# Patient Record
Sex: Female | Born: 1955 | Race: White | Hispanic: No | Marital: Married | Smoking: Never smoker
Health system: Southern US, Community
[De-identification: ages and names within clinical notes are randomized; demographics above are authoritative.]

## PROBLEM LIST (undated history)

## (undated) DIAGNOSIS — M1990 Unspecified osteoarthritis, unspecified site: Secondary | ICD-10-CM

## (undated) DIAGNOSIS — N179 Acute kidney failure, unspecified: Secondary | ICD-10-CM

## (undated) DIAGNOSIS — R7303 Prediabetes: Secondary | ICD-10-CM

## (undated) DIAGNOSIS — N393 Stress incontinence (female) (male): Secondary | ICD-10-CM

## (undated) DIAGNOSIS — E663 Overweight: Secondary | ICD-10-CM

## (undated) DIAGNOSIS — C9000 Multiple myeloma not having achieved remission: Secondary | ICD-10-CM

## (undated) DIAGNOSIS — I10 Essential (primary) hypertension: Secondary | ICD-10-CM

## (undated) DIAGNOSIS — D649 Anemia, unspecified: Secondary | ICD-10-CM

## (undated) DIAGNOSIS — N6019 Diffuse cystic mastopathy of unspecified breast: Secondary | ICD-10-CM

## (undated) DIAGNOSIS — C801 Malignant (primary) neoplasm, unspecified: Secondary | ICD-10-CM

## (undated) HISTORY — DX: Anemia, unspecified: D64.9

## (undated) HISTORY — PX: CARPAL TUNNEL RELEASE: SHX101

## (undated) HISTORY — DX: Stress incontinence (female) (male): N39.3

## (undated) HISTORY — DX: Multiple myeloma not having achieved remission: C90.00

## (undated) HISTORY — DX: Essential (primary) hypertension: I10

## (undated) HISTORY — PX: TONSILLECTOMY AND ADENOIDECTOMY: SHX28

## (undated) HISTORY — DX: Unspecified osteoarthritis, unspecified site: M19.90

## (undated) HISTORY — DX: Malignant (primary) neoplasm, unspecified: C80.1

## (undated) HISTORY — DX: Overweight: E66.3

## (undated) HISTORY — DX: Diffuse cystic mastopathy of unspecified breast: N60.19

## (undated) HISTORY — DX: Prediabetes: R73.03

---

## 1993-04-14 HISTORY — PX: CHOLECYSTECTOMY: SHX55

## 2003-08-31 ENCOUNTER — Ambulatory Visit: Admission: RE | Admit: 2003-08-31 | Discharge: 2003-08-31 | Payer: Self-pay | Admitting: Emergency Medicine

## 2003-12-13 ENCOUNTER — Ambulatory Visit (HOSPITAL_COMMUNITY): Admission: RE | Admit: 2003-12-13 | Discharge: 2003-12-13 | Payer: Self-pay | Admitting: Family Medicine

## 2006-07-07 ENCOUNTER — Other Ambulatory Visit: Admission: RE | Admit: 2006-07-07 | Discharge: 2006-07-07 | Payer: Self-pay | Admitting: Family Medicine

## 2007-09-03 ENCOUNTER — Other Ambulatory Visit: Admission: RE | Admit: 2007-09-03 | Discharge: 2007-09-03 | Payer: Self-pay | Admitting: Family Medicine

## 2007-09-03 ENCOUNTER — Encounter: Admission: RE | Admit: 2007-09-03 | Discharge: 2007-09-03 | Payer: Self-pay | Admitting: Family Medicine

## 2008-07-13 ENCOUNTER — Other Ambulatory Visit: Admission: RE | Admit: 2008-07-13 | Discharge: 2008-07-13 | Payer: Self-pay | Admitting: Family Medicine

## 2008-09-29 ENCOUNTER — Encounter: Admission: RE | Admit: 2008-09-29 | Discharge: 2008-09-29 | Payer: Self-pay | Admitting: Family Medicine

## 2009-07-10 ENCOUNTER — Other Ambulatory Visit: Admission: RE | Admit: 2009-07-10 | Discharge: 2009-07-10 | Payer: Self-pay | Admitting: Family Medicine

## 2009-10-05 ENCOUNTER — Encounter: Admission: RE | Admit: 2009-10-05 | Discharge: 2009-10-05 | Payer: Self-pay | Admitting: Family Medicine

## 2010-09-27 ENCOUNTER — Other Ambulatory Visit: Payer: Self-pay | Admitting: Obstetrics & Gynecology

## 2010-09-27 DIAGNOSIS — Z1231 Encounter for screening mammogram for malignant neoplasm of breast: Secondary | ICD-10-CM

## 2010-10-08 ENCOUNTER — Ambulatory Visit
Admission: RE | Admit: 2010-10-08 | Discharge: 2010-10-08 | Disposition: A | Discharge status: Home / Self Care | Payer: BC Managed Care – PPO | Source: Ambulatory Visit | Attending: Obstetrics & Gynecology | Admitting: Obstetrics & Gynecology

## 2010-10-08 DIAGNOSIS — Z1231 Encounter for screening mammogram for malignant neoplasm of breast: Secondary | ICD-10-CM

## 2011-07-30 ENCOUNTER — Ambulatory Visit
Admission: RE | Admit: 2011-07-30 | Discharge: 2011-07-30 | Disposition: A | Discharge status: Home / Self Care | Payer: BC Managed Care – PPO | Source: Ambulatory Visit | Attending: Gastroenterology | Admitting: Gastroenterology

## 2011-07-30 ENCOUNTER — Other Ambulatory Visit: Payer: Self-pay | Admitting: Gastroenterology

## 2011-07-30 DIAGNOSIS — R05 Cough: Secondary | ICD-10-CM

## 2011-08-01 ENCOUNTER — Encounter: Payer: Self-pay | Admitting: Internal Medicine

## 2011-08-04 ENCOUNTER — Ambulatory Visit (INDEPENDENT_AMBULATORY_CARE_PROVIDER_SITE_OTHER): Payer: BC Managed Care – PPO | Admitting: Internal Medicine

## 2011-08-04 ENCOUNTER — Encounter: Payer: Self-pay | Admitting: Internal Medicine

## 2011-08-04 DIAGNOSIS — J45909 Unspecified asthma, uncomplicated: Secondary | ICD-10-CM

## 2011-08-04 NOTE — Progress Notes (Signed)
  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  08/04/2011 1st pulmonary ov  Persistent  Mostly dry Cough since 09/2010   and sob wheezing occurs consistently  abuptly  Since onset of coughwhen climbing up steps more so than usual and since daily and nightly symptoms unless props up on two pillows eval by Drs Dominica Severin dx was bronchitis/wheezing rx zpak symbicort.(better when took it then worse when stopped then restarted around April 1 when  worse again,   rx zithmax 3/5 for yellow mucus s typical flare yet in typical springtime sinus complaints.  Sleeping ok without nocturnal  or early am exacerbation  of respiratory  c/o's or need for noct saba. Also denies any obvious fluctuation of symptoms with weather or environmental changes or other aggravating or alleviating factors except as outlined above     Review of Systems  Constitutional: Positive for unexpected weight change. Negative for fever and chills.  HENT: Negative for ear pain, nosebleeds, congestion, sore throat, rhinorrhea, sneezing, trouble swallowing, dental problem, voice change, postnasal drip and sinus pressure.   Eyes: Negative for visual disturbance.  Respiratory: Positive for cough and shortness of breath. Negative for choking.   Cardiovascular: Negative for chest pain and leg swelling.  Gastrointestinal: Positive for abdominal pain. Negative for vomiting and diarrhea.  Genitourinary: Negative for difficulty urinating.  Musculoskeletal: Negative for arthralgias.  Skin: Negative for rash.  Neurological: Negative for tremors, syncope and headaches.  Hematological: Does not bruise/bleed easily.       Objective:   Physical Exam  Wt 170 08/04/2011  HEENT mild turbinate edema.  Oropharynx no  thrush or excess pnd or cobblestoning.  No JVD or cervical adenopathy. Mild accessory muscle hypertrophy. Trachea midline, nl thryroid. Chest was hyperinflated by percussion with diminished breath sounds and mild increased exp time with mid exp wheeze. Hoover sign positive at mid inspiration. Regular rate and rhythm without murmur gallop or rub or increase P2 or edema.  Abd: no hsm, nl excursion. Ext warm without cyanosis or clubbing.    07/30/11 cxr Enlargement of cardiac silhouette with pulmonary vascular  congestion.  Minimal bronchitic changes.     Assessment & Plan:

## 2011-08-04 NOTE — Patient Instructions (Signed)
Symbicort 160 Take 2 puffs first thing in am and then another 2 puffs about 12 hours later.    Work on inhaler technique:  relax and gently blow all the way out then take a nice smooth deep breath back in, triggering the inhaler at same time you start breathing in.  Hold for up to 5 seconds if you can.  Rinse and gargle with water when done   If your mouth or throat starts to bother you,   I suggest you time the inhaler to your dental care and after using the inhaler(s) brush teeth and tongue with a baking soda containing toothpaste and when you rinse this out, gargle with it first to see if this helps your mouth and throat.     GERD (REFLUX)  is an extremely common cause of respiratory symptoms, many times with no significant heartburn at all.    It can be treated with medication, but also with lifestyle changes including avoidance of late meals, excessive alcohol, smoking cessation, and avoid fatty foods, chocolate, peppermint, colas, red wine, and acidic juices such as orange juice.  NO MINT OR MENTHOL PRODUCTS SO NO COUGH DROPS  USE SUGARLESS CANDY INSTEAD (jolley ranchers or Stover's)  NO OIL BASED VITAMINS - use powdered substitutes.   Please schedule a follow up office visit in 2  weeks, sooner if needed.

## 2011-08-07 DIAGNOSIS — J45909 Unspecified asthma, uncomplicated: Secondary | ICD-10-CM | POA: Insufficient documentation

## 2011-08-07 NOTE — Assessment & Plan Note (Signed)
Symptoms are markedly disproportionate to objective findings and not clear this is all a lung problem but pt does appear to have difficult airway management issues.  DDX of  difficult airways managment all start with A and  include Adherence, Ace Inhibitors, Acid Reflux, Active Sinus Disease, Alpha 1 Antitripsin deficiency, Anxiety masquerading as Airways dz,  ABPA,  allergy(esp in young), Aspiration (esp in elderly), Adverse effects of DPI,  Active smokers, plus two Bs  = Bronchiectasis and Beta blocker use..and one C= CHF  Adherence is always the initial "prime suspect" and is a multilayered concern that requires a "trust but verify" approach in every patient - starting with knowing how to use medications, especially inhalers, correctly, keeping up with refills and understanding the fundamental difference between maintenance and prns vs those medications only taken for a very short course and then stopped and not refilled. The proper method of use, as well as anticipated side effects, of a metered-dose inhaler are discussed and demonstrated to the patient. Improved effectiveness after extensive coaching during this visit to a level of approximately  50% but no better. Will try to master this on symbicort then bring back in 2 weeks  ? Acid reflux > reviwewed max gerd rx/ diet  ? Anxiety > dx of exclusion always but note absence of noct and early am exac of symptoms not typical of asthma.

## 2011-08-12 ENCOUNTER — Other Ambulatory Visit: Payer: Self-pay | Admitting: Oncology

## 2011-08-12 ENCOUNTER — Telehealth: Payer: Self-pay | Admitting: Oncology

## 2011-08-12 NOTE — Telephone Encounter (Signed)
Called pt re appt w/FS. Per pt she would like to see GM instead as Dr. Madilyn Fireman told her she was being referred to GM. No specific physician requested on referral. Message sent to GM and copied to Amy/Tiffany asking if GM can see pt. Pt aware to disregard 5/3 appt w/FS and I will call her back w/appt to see GM.

## 2011-08-12 NOTE — Telephone Encounter (Signed)
lmonvm for pt re calling me for appt w/FS. 1st attempt. °

## 2011-08-13 ENCOUNTER — Telehealth: Payer: Self-pay | Admitting: Oncology

## 2011-08-13 ENCOUNTER — Other Ambulatory Visit: Payer: Self-pay | Admitting: Oncology

## 2011-08-13 NOTE — Telephone Encounter (Signed)
Per GM he will see pt 5/8 @ 4:30 pm and have pt come for lb at least 3 days prior. S/w pt today re appt for lb 5/2 @ 9 am and GM 5/8 @ 4pm. Pt requested to be scheduled w/GM.

## 2011-08-14 ENCOUNTER — Other Ambulatory Visit (HOSPITAL_BASED_OUTPATIENT_CLINIC_OR_DEPARTMENT_OTHER): Payer: BC Managed Care – PPO | Admitting: Lab

## 2011-08-14 ENCOUNTER — Telehealth: Payer: Self-pay | Admitting: Oncology

## 2011-08-14 ENCOUNTER — Other Ambulatory Visit: Payer: Self-pay | Admitting: *Deleted

## 2011-08-14 ENCOUNTER — Other Ambulatory Visit: Payer: Self-pay | Admitting: Oncology

## 2011-08-14 DIAGNOSIS — D472 Monoclonal gammopathy: Secondary | ICD-10-CM

## 2011-08-14 DIAGNOSIS — E8809 Other disorders of plasma-protein metabolism, not elsewhere classified: Secondary | ICD-10-CM

## 2011-08-14 LAB — CBC WITH DIFFERENTIAL/PLATELET
BASO%: 0.8 % (ref 0.0–2.0)
EOS%: 1.1 % (ref 0.0–7.0)
HCT: 27.7 % — ABNORMAL LOW (ref 34.8–46.6)
LYMPH%: 26.2 % (ref 14.0–49.7)
MCH: 30.7 pg (ref 25.1–34.0)
MCHC: 32.3 g/dL (ref 31.5–36.0)
MONO%: 6.6 % (ref 0.0–14.0)
Platelets: 310 10*3/uL (ref 145–400)
RBC: 2.91 10*6/uL — ABNORMAL LOW (ref 3.70–5.45)

## 2011-08-14 LAB — PROTEIN / CREATININE RATIO, URINE
Protein Creatinine Ratio: 0.17 — ABNORMAL HIGH (ref <0.15)
Total Protein, Urine: 12 mg/dL

## 2011-08-14 NOTE — Telephone Encounter (Signed)
Referred by Dr. Madilyn Fireman Dx- Anemia

## 2011-08-18 ENCOUNTER — Encounter: Payer: Self-pay | Admitting: Internal Medicine

## 2011-08-18 ENCOUNTER — Ambulatory Visit (INDEPENDENT_AMBULATORY_CARE_PROVIDER_SITE_OTHER): Payer: BC Managed Care – PPO | Admitting: Internal Medicine

## 2011-08-18 DIAGNOSIS — J45909 Unspecified asthma, uncomplicated: Secondary | ICD-10-CM

## 2011-08-18 MED ORDER — BUDESONIDE-FORMOTEROL FUMARATE 80-4.5 MCG/ACT IN AERO: 2.0000 | INHALATION_SPRAY | Freq: Two times a day (BID) | RESPIRATORY_TRACT | Status: DC

## 2011-08-18 NOTE — Patient Instructions (Signed)
Remember to exhale the symbicort thru nose.  Try symbicort 80 Take 2 puffs first thing in am and then another 2 puffs about 12 hours and fill the prescription - ok the night time dose if doing great.   If you are satisfied with your treatment plan let your doctor know and he/she can either refill your medications or you can return here when your prescription runs out.     If in any way you are not 100% satisfied,  please tell us.  If 100% better, tell your friends!

## 2011-08-18 NOTE — Progress Notes (Signed)
  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  08/04/2011 1st pulmonary ov  Persistent  Mostly dry Cough since 09/2010   and sob wheezing occurs consistently  abuptly  Since onset of cough when climbing up steps more so than usual and since daily and nightly symptoms unless props up on two pillows eval by Drs Laura Hester dx was bronchitis/wheezing rx zpak symbicort.(better when took it then worse when stopped then restarted around April 1 when  worse again,   rx zithmax 3/5 for yellow mucus s typical flare yet in typical springtime sinus complaints. rec Symbicort 160 Take 2 puffs first thing in am and then another 2 puffs about 12 hours later.  Work on inhaler technique:    GERD diet    08/18/2011 f/u ov/Laura Hester cc better to her satisfaction no cough/ sob but hasn't tried athletics yet. Walking outdoors ok. No need for saba at all with very good hfa technique.  Sleeping ok without nocturnal  or early am exacerbation  of respiratory  c/o's or need for noct saba. Also denies any obvious fluctuation of symptoms with weather or environmental changes or other aggravating or alleviating factors except as outlined above.  ROS  At present neg for  any significant sore throat, dysphagia, dental problems, itching, sneezing,  nasal congestion or excess/ purulent secretions, ear ache,   fever, chills, sweats, unintended wt loss, pleuritic or exertional cp, hemoptysis, palpitations, orthopnea pnd or leg swelling.  Also denies presyncope, palpitations, heartburn, abdominal pain, anorexia, nausea, vomiting, diarrhea  or change in bowel or urinary habits, change in stools or urine, dysuria,hematuria,  rash,  arthralgias, visual complaints, headache, numbness weakness or ataxia or problems with walking or coordination. No noted change in mood/affect or memory.                          Objective:   Physical Exam  Wt 170 08/04/2011 > 08/18/2011  166 HEENT mild turbinate edema.  Oropharynx no thrush or excess pnd or cobblestoning.  No JVD or cervical adenopathy. Min accessory muscle hypertrophy. Trachea midline, nl thryroid. Chest was not hyperinflated by percussion and there was no increased exp time  Or exp wheeze. Hoover sign neg inspiration. Regular rate and rhythm without murmur gallop or rub or increase P2 or edema.  Abd: no hsm, nl excursion. Ext warm without cyanosis or clubbing.    07/30/11 cxr Enlargement of cardiac silhouette with pulmonary vascular  congestion.  Minimal bronchitic changes.     Assessment & Plan:

## 2011-08-19 LAB — COMPREHENSIVE METABOLIC PANEL
CO2: 24 mEq/L (ref 19–32)
Calcium: 10.1 mg/dL (ref 8.4–10.5)
Chloride: 103 mEq/L (ref 96–112)
Potassium: 4.2 mEq/L (ref 3.5–5.3)
Total Bilirubin: 0.4 mg/dL (ref 0.3–1.2)
Total Protein: 9.4 g/dL — ABNORMAL HIGH (ref 6.0–8.3)

## 2011-08-19 LAB — PROTEIN ELECTROPHORESIS, SERUM
Albumin ELP: 30 % — ABNORMAL LOW (ref 55.8–66.1)
Alpha-1-Globulin: 2.6 % — ABNORMAL LOW (ref 2.9–4.9)

## 2011-08-19 LAB — BETA 2 MICROGLOBULIN, SERUM: Beta-2 Microglobulin: 5.51 mg/L — ABNORMAL HIGH (ref 1.01–1.73)

## 2011-08-19 NOTE — Assessment & Plan Note (Addendum)
All goals of chronic asthma control met including optimal function and elimination of symptoms with minimal need for rescue therapy.  Contingencies discussed in full including contacting this office immediately if not controlling the symptoms using the rule of two's.     Try symbicort 80/4.5 2bid with low threshold to increase back to 160 and add gerd rx if flaring.  The proper method of use, as well as anticipated side effects, of a metered-dose inhaler are discussed and demonstrated to the patient. Improved effectiveness after extensive coaching during this visit to a level of approximately  90%

## 2011-08-20 ENCOUNTER — Ambulatory Visit (HOSPITAL_BASED_OUTPATIENT_CLINIC_OR_DEPARTMENT_OTHER): Payer: BC Managed Care – PPO | Admitting: Oncology

## 2011-08-20 ENCOUNTER — Ambulatory Visit: Payer: BC Managed Care – PPO

## 2011-08-20 ENCOUNTER — Encounter: Payer: Self-pay | Admitting: Oncology

## 2011-08-20 ENCOUNTER — Telehealth: Payer: Self-pay | Admitting: Oncology

## 2011-08-20 DIAGNOSIS — C9000 Multiple myeloma not having achieved remission: Secondary | ICD-10-CM | POA: Insufficient documentation

## 2011-08-20 LAB — UIFE/LIGHT CHAINS/TP QN, 24-HR UR
Albumin, U: DETECTED
Alpha 1, Urine: DETECTED — AB

## 2011-08-20 NOTE — Telephone Encounter (Signed)
Referral in workque for bmbx via IR but no order. Also no pof sent. Message to [facility] via email to add order for bmbx.

## 2011-08-20 NOTE — Progress Notes (Signed)
ID: VELVET MOOMAW   DOB: 07-19-1955  MR#: [id number]  MVH#:[id number]  HISTORY OF PRESENT ILLNESS: Laura Hester has a history of iron deficiency anemia secondary to menorrhagia. However, as this persisted despite near-total cessation of her menses and despite iron supplementation, she was referred to Dr. Dorena Cookey for GI evaluation. He found the patient's Hb to have decreased from 9.17 June 2011 to 8.3 07/29/2011. MCV was 95.9, ferritin 93.3, with a normal folate and negative ANA. Guaiacs were negative. Creatinine was 0.77.  t-transglutaminase IgA was normal at 2, but the total IgA was 6.9 g. Accordingly on 08/04/2011 an SPEP was obtained, showing an M-spike of 4.5 g, with a secondary M-spike of 0.4 g. Total protein was 10.0 with albumin 3.2, calcium 10.2. The patient was referred for further evaluation, with subsequent history as detailed below.  INTERVAL HISTORY: Laura Hester comes today with her husband [name] for a full discussion of her situation. Additional labwork was obtained before the visit further supporting a diagnosis of myeloma.Laura Hester is very concerned that her older daughter's upcoming marriage June 1st not be disrupted but she is willing to have further tests if needed before start of treatment  REVIEW OF SYSTEMS: She had a recent episode of bronchitis treated with zithromax. She saw Francella Solian for persistent cough and he diagnosed asthma. This is much better on her inhaler. She has lost 25 pounds over the last several months--she attributed this to an improved diet. She brought me a list of blood pressures taken off medication and they are convincingly normal.She feels the diet and weight loss have resolved the HTN. She is tolerating her chronic anemia without dizzyness, palpitations or unusual fatigue. A detailed review of systems was otherwise noncontributory.  PAST MEDICAL HISTORY: Past Medical History  Diagnosis Date  . Fibrocystic breast disease   . Allergic rhinitis   . HTN  (hypertension)   . Prediabetes   . SUI (stress urinary incontinence, female)   . Obesity   . Glaucoma   . Asthma   . Multiple myeloma, stage 3   . Anemia     PAST SURGICAL HISTORY: Past Surgical History  Procedure Date  . Cholecystectomy 1995  . Tonsillectomy and adenoidectomy   . Cesarean section     FAMILY HISTORY Family History  Problem Relation Age of Onset  . Heart disease Father   . Breast cancer Mother   . Heart disease Mother   . Diabetes Paternal Grandfather   . Hypertension Maternal Grandmother   . Hypertension Brother   The patient's parents are in their mid-70s.Her mother has a remote history of breast cancer. She has two brothers, one her twin; no sisters. There is no other history of cancer in the family to her knowledge  GYNECOLOGIC HISTORY: Menarche age 26, first live birth age 59, GX P28. Last period was January 2013, before that May 2012.  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 Her husband of 31 years, [name], graduated from [employer] and works in Regulatory affairs officer. Daughter Laura Hester works for [employer] and as noted will be married June 1st. Daughter Laura Hester studies business at [employer]. The patient attends the [employer]   ADVANCED DIRECTIVES:  HEALTH MAINTENANCE: History  Substance Use Topics  . Smoking status: Never Smoker   . Smokeless tobacco: Never Used  . Alcohol Use: No     Colonoscopy: 2009/[facility]  PAP: UTD  Bone density: never  Lipid panel: [place]/ "excellent"  Allergies  Allergen  Reactions  . Wellbutrin (Bupropion Hcl) Hives    Current Outpatient Prescriptions  Medication Sig Dispense Refill  . budesonide-formoterol (SYMBICORT) 160-4.5 MCG/ACT inhaler Inhale 2 puffs into the lungs 2 (two) times daily.      . budesonide-formoterol (SYMBICORT) 80-4.5 MCG/ACT inhaler Inhale 2 puffs into the lungs 2 (two) times daily.  1 Inhaler  12  . Ferrous Sulfate (IRON) 325 (65 FE) MG  TABS Take 1 tablet by mouth daily.        OBJECTIVE: midddle aged White woman who appears well Filed Vitals:   08/20/11 1609  BP: 148/77  Pulse: 109  Temp: 98.8 F (37.1 C)     Body mass index is 29.99 kg/(m^2).    ECOG FS: 0  Sclerae unicteric Oropharynx clear No peripheral adenopathy Lungs no rales or rhonchi Heart regular rate and rhythm Abd benign MSK no focal spinal tenderness, no peripheral edema Neuro: nonfocal Breasts: deferred  LAB RESULTS: Lab Results  Component Value Date   WBC 5.3 08/14/2011   NEUTROABS 3.4 08/14/2011   HGB 8.9* 08/14/2011   HCT 27.7* 08/14/2011   MCV 95.2 08/14/2011   PLT 310 08/14/2011      Chemistry      Component Value Date/Time   NA 141 08/14/2011 0923   K 4.2 08/14/2011 0923   CL 103 08/14/2011 0923   CO2 24 08/14/2011 0923   BUN 13 08/14/2011 0923   CREATININE 0.74 08/14/2011 0923      Component Value Date/Time   CALCIUM 10.1 08/14/2011 0923   ALKPHOS 86 08/14/2011 0923   AST 18 08/14/2011 0923   ALT 12 08/14/2011 0923   BILITOT 0.4 08/14/2011 0923     Urine protein/creatinine ratio 0.17 Urine IFE shows IgA-lambda and excess free lambda light chains Serum kappa light chains 0.03, lambda 106.00 Beta-2-microglobulin 5.51  No results found for this basename: LABCA2    No components found with this basename: LABCA125    No results found for this basename: INR:1;PROTIME:1 in the last 168 hours  Urinalysis No results found for this basename: colorurine, appearanceur, labspec, phurine, glucoseu, hgbur, bilirubinur, ketonesur, proteinur, urobilinogen, nitrite, leukocytesur    STUDIES: Dg Chest 2 View  07/30/2011  *RADIOLOGY REPORT*  Clinical Data: Cough, shortness of breath  CHEST - 2 VIEW  Comparison: None  Findings: Enlargement of cardiac silhouette. Mediastinal contours normal. Slight pulmonary vascular congestion. Minimal peribronchial thickening. No pulmonary infiltrate, pleural effusion or pneumothorax. Bones demineralized with endplate spur  formation thoracolumbar spine. Surgical of right upper quadrant question cholecystectomy.  IMPRESSION: Enlargement of cardiac silhouette with pulmonary vascular congestion. Minimal bronchitic changes.  Original Report Authenticated By: Lollie Marrow, M.D.    ASSESSMENT: 56 year-old [place] woman presenting with anemia, found to have an IgA >6g, with an M-spike of 4.5 g (and a second M-spike of 0.4g), urine IFE showing IgA-lambda and lambda light chains, creatinine 0.77, calcium 10.0, albumin 3.1, and beta-2-microglobulin 5.51  PLAN: these labs are consistent with multiple myeloma, stage 3, and I discussed her situation on that basis. She understands diagnosis requires a bone marrow biopsy and workup also will include a bone survey--these are being ordered for the next two weeks. She does not want to begin any treatment until after her daughter's wedding, so she will see me June 6th to discuss results of the upcoming tests and to start her treatment, which likely will consist of bortezomib sq, cyclophosphamide and dexamethasone po until remission, then high-dose chemotherapy with autologous stem-cell transpplant at [facility]. She will  call for results of the bone marrow biopsy in particular and I will request a visit with the [facility] transplant team. Today she completed paperwork for lenalidomide in case we decide to use that agent at some point.  This visit was understandably stressful for Kayline and [name] and they know I will be glad to see them before June 6th if that would be helpful.   Coltin Casher C    08/20/2011

## 2011-08-21 ENCOUNTER — Other Ambulatory Visit: Payer: Self-pay | Admitting: *Deleted

## 2011-08-21 ENCOUNTER — Telehealth: Payer: Self-pay | Admitting: Oncology

## 2011-08-21 ENCOUNTER — Other Ambulatory Visit: Payer: Self-pay | Admitting: Oncology

## 2011-08-21 DIAGNOSIS — C9000 Multiple myeloma not having achieved remission: Secondary | ICD-10-CM

## 2011-08-21 NOTE — Telephone Encounter (Signed)
S/w tiffany @ [facility] re appt for bx. [facility] will contact pt - pt aware. S/w pt re bone survey for 5/14 and lb 6/3. Pt aware I will call back w/fu appt. June 6th is survivorship day so f/u could not be scheduled @ 4:30 pm. Emailed GM for new d/t.

## 2011-08-22 ENCOUNTER — Telehealth: Payer: Self-pay | Admitting: Internal Medicine

## 2011-08-22 NOTE — Telephone Encounter (Signed)
Spoke with the pt and she states she wanted to speak to [name] about her recent diagnosis of multiple myeloma stage 3.  She states she has seen Dr. Darnelle Catalan and he will follow her for this. She just wanted to make [name] aware of the diagnosis and states she would like him to call her to discuss with her. I advised I will forward message to [name]. Carron Curie, CMA

## 2011-08-22 NOTE — Telephone Encounter (Signed)
Discussed dx of MM ? Responsible for some of her symptoms but hard to tell > rec no change recs from a pulmonary perspective

## 2011-08-25 ENCOUNTER — Telehealth: Payer: Self-pay | Admitting: *Deleted

## 2011-08-25 NOTE — Telephone Encounter (Signed)
Per email response from GM he will see pt 6/7. appt has already been added and pt has been contacted.

## 2011-08-25 NOTE — Telephone Encounter (Signed)
gave patient appointment for 09-19-2011 starting at 10:00am with labs patient confirmed over the phone then new date and time on 09-19-2011

## 2011-08-26 ENCOUNTER — Ambulatory Visit (HOSPITAL_COMMUNITY)
Admission: RE | Admit: 2011-08-26 | Discharge: 2011-08-26 | Disposition: A | Discharge status: Home / Self Care | Payer: BC Managed Care – PPO | Source: Ambulatory Visit | Attending: Oncology | Admitting: Oncology

## 2011-08-26 DIAGNOSIS — C9000 Multiple myeloma not having achieved remission: Secondary | ICD-10-CM | POA: Insufficient documentation

## 2011-08-29 ENCOUNTER — Other Ambulatory Visit: Payer: Self-pay | Admitting: Radiology

## 2011-09-01 ENCOUNTER — Ambulatory Visit (HOSPITAL_COMMUNITY)
Admission: RE | Admit: 2011-09-01 | Discharge: 2011-09-01 | Disposition: A | Discharge status: Home / Self Care | Payer: BC Managed Care – PPO | Source: Ambulatory Visit | Attending: Oncology | Admitting: Oncology

## 2011-09-01 ENCOUNTER — Encounter (HOSPITAL_COMMUNITY): Payer: Self-pay

## 2011-09-01 DIAGNOSIS — C9000 Multiple myeloma not having achieved remission: Secondary | ICD-10-CM | POA: Insufficient documentation

## 2011-09-01 LAB — CBC
MCH: 28.6 pg (ref 26.0–34.0)
MCHC: 30 g/dL (ref 30.0–36.0)
MCV: 95.4 fL (ref 78.0–100.0)
Platelets: 303 10*3/uL (ref 150–400)
RDW: 20.5 % — ABNORMAL HIGH (ref 11.5–15.5)

## 2011-09-01 MED ORDER — SODIUM CHLORIDE 0.9 % IV SOLN: INTRAVENOUS | Status: DC

## 2011-09-01 MED ORDER — FENTANYL CITRATE 0.05 MG/ML IJ SOLN
INTRAMUSCULAR | Status: AC | PRN
  Administered 2011-09-01 (×2): 100 ug via INTRAVENOUS

## 2011-09-01 MED ORDER — FENTANYL CITRATE 0.05 MG/ML IJ SOLN
INTRAMUSCULAR | Status: AC
  Filled 2011-09-01: qty 4

## 2011-09-01 MED ORDER — MIDAZOLAM HCL 5 MG/5ML IJ SOLN
INTRAMUSCULAR | Status: AC | PRN
  Administered 2011-09-01 (×2): 2 mg via INTRAVENOUS

## 2011-09-01 MED ORDER — MIDAZOLAM HCL 2 MG/2ML IJ SOLN
INTRAMUSCULAR | Status: AC
  Filled 2011-09-01: qty 4

## 2011-09-01 NOTE — Discharge Instructions (Signed)
Bone Marrow Aspiration This is an examination in which a needle is inserted into a bone and a portion of the marrow in the bone is removed. This test confirms the diagnosis of anemias, leukemia, or myelomas. It helps determine the cause of decreased blood cells and deficient iron stores. It documents the process of metastasis if there has been a tumor which has spread to bone. It also helps study tumor staging, especially in lymphomas. There are two methods for sampling bone marrow, an aspirate and a biopsy. The bone marrow, a soft fatty tissue found inside the body's larger bones, is often described as being like a honeycomb - it is a fibrous network filled with a liquid containing cells in various stages of maturation, and "raw materials" such as iron, vitamin B12, and folate that are required for cell production. The bone marrow aspirate provides a liquid sample of cells that can be studied individually, and the biopsy collects a sample that preserves the marrow's structure and shows the relationships of bone marrow cells to one another and the relative amount of marrow cells compared to fat (cellularity).  Red blood cells (RBCs), platelets, and five different types of white blood cells (WBCs) are produced in the marrow as needed, with the number and type of cell being created at any one time based on the use of cells, loss, and a continual replacement of old cells. The bone marrow biopsy and aspiration provide information about the status of and capability for blood cell production. They are not routinely ordered and in fact the majority of people will never have one done. A marrow aspiration or biopsy may be ordered to help evaluate blood cell production, to help diagnose leukemia, to help diagnose a bone marrow disorder, to help diagnose and stage a variety of other types of cancer (to determine spread into the marrow), and to help determine whether a severe anemia is due to decreased RBC production,  increased loss, abnormal RBC production, or to a vitamin or mineral deficiency or excess. Conditions that affect the marrow can affect the number, mixture, and maturity of the cells, and can affect its fibrous structure. PREPARATION FOR TEST  No special preparation is needed. The bone marrow aspiration or biopsy procedure is performed by a caregiver. Both types of samples may be collected from the hip bone (pelvis), and marrow aspirations may be collected from the breastbone (sternum). In children, samples may also be collected from a vertebrae in the back or from the thigh bone (femur).   The most common collection site is the top ridge of the hip bone (iliac crest). Before the procedure, your blood pressure, heart rate, and temperature are measured and evaluated to make sure that they are within normal limits. You may be given a mild sedative. You will be asked to lie down on your stomach or side. Your lower body is draped with cloths so that only the area surrounding the site is exposed.   The site is cleaned with an antiseptic and injected with a local anesthetic. When the site is numb, the caregiver inserts a needle through the skin and into the bone. For an aspiration, the caregiver attaches a syringe to the needle and pulls back on the plunger. This creates vacuum pressure and pulls a small amount of marrow into the syringe.   For a bone marrow biopsy, the caregiver uses a special needle that allows the collection of a sample of bone and marrow. Even though your skin has been numbed,   you may feel brief but uncomfortable pressure sensations during these procedures. After the needle has been withdrawn, a sterile bandage is placed over the site and pressure is applied. You will then usually be instructed to lie quietly until your blood pressure, heart rate, and temperature are normal. You may be instructed to keep the site dry and covered for 48 hours.   Complications from the bone marrow aspiration  or biopsy procedure are rare, but some individuals may have excessive bleeding at the site or develop an infection.   Tell your caregiver about any allergies you have and about any medications or supplements you are taking prior to the procedure.  NORMAL FINDINGS Cell type / Range (%)  Myeloblasts / less than 5   Promyelocytes / 1-8   Myelocytes   Neutrophilic / 5-15   Eosinophilic / 0.5-3   Basophilic / less than 1   Metamyelocytes   Neutrophilic / 15-25   Eosinophilic / less than 1   Basophilic / less than 1   Mature myelocytes   Neutrophilic / 10-30   Eosinophilic / less than 5   Basophilic / less than 5   Mononuclear   Monocytes / less than 5   Lymphocytes / 3-20   Plasma cells / less than 1   Megakaryocytes / less than 5   M/E ratio / less than 4   Normoblasts / 25-50  Normal iron content is demonstrated by staining with Prussian blue. Ranges for normal findings may vary among different laboratories and hospitals. You should always check with your caregiver after having lab work or other tests done to discuss the meaning of your test results and whether your values are considered within normal limits. MEANING OF TEST  Your caregiver will go over the test results with you and discuss the importance and meaning of your results, as well as treatment options and the need for additional tests if necessary. OBTAINING THE TEST RESULTS It is your responsibility to obtain your test results. Ask the lab or department performing the test when and how you will get your results. Document Released: 04/23/2004 Document Revised: 03/20/2011 Document Reviewed: 03/07/2008 Exit[employer] Patient Information 2012 [employer]. 

## 2011-09-01 NOTE — Procedures (Signed)
CT guided bone marrow FNAs and core biopsies.  No immediate complication.

## 2011-09-01 NOTE — H&P (Signed)
Laura Hester is an 56 y.o. female.   Chief Complaint: multiple myeloma.  Patient presents today for BM needle core biopsy. HPI: See oncology note below from patient's prior visit :   encounter: Office Visit from 08/20/2011 in [facility]  ID: MAANASA ADERHOLD   DOB: 23-Oct-1955 MR#: [id number]  [id number]#:[id number]  HISTORY OF PRESENT ILLNESS: Laura Hester has a history of iron deficiency anemia secondary to menorrhagia. However, as this persisted despite near-total cessation of her menses and despite iron supplementation, she was referred to Dr. Dorena Cookey for GI evaluation. He found the patient's Hb to have decreased from 9.17 June 2011 to 8.3 07/29/2011. MCV was 95.9, ferritin 93.3, with a normal folate and negative ANA. Guaiacs were negative. Creatinine was 0.77.  t-transglutaminase IgA was normal at 2, but the total IgA was 6.9 g. Accordingly on 08/04/2011 an SPEP was obtained, showing an M-spike of 4.5 g, with a secondary M-spike of 0.4 g. Total protein was 10.0 with albumin 3.2, calcium 10.2. The patient was referred for further evaluation, with subsequent history as detailed below.  INTERVAL HISTORY: Laura Hester comes today with her husband [name] for a full discussion of her situation. Additional labwork was obtained before the visit further supporting a diagnosis of myeloma.Laura Hester is very concerned that her older daughter's upcoming marriage June 1st not be disrupted but she is willing to have further tests if needed before start of treatment  REVIEW OF SYSTEMS: She had a recent episode of bronchitis treated with zithromax. She saw Francella Solian for persistent cough and he diagnosed asthma. This is much better on her inhaler. She has lost 25 pounds over the last several months--she attributed this to an improved diet. She brought me a list of blood pressures taken off medication and they are convincingly normal.She feels the diet and weight loss have resolved the HTN. She  is tolerating her chronic anemia without dizzyness, palpitations or unusual fatigue. A detailed review of systems was otherwise noncontributory.  PAST MEDICAL HISTORY: Past Medical History   Diagnosis  Date   .  Fibrocystic breast disease     .  Allergic rhinitis     .  HTN (hypertension)     .  Prediabetes     .  SUI (stress urinary incontinence, female)     .  Obesity     .  Glaucoma     .  Asthma     .  Multiple myeloma, stage 3     .  Anemia       PAST SURGICAL HISTORY: Past Surgical History   Procedure  Date   .  Cholecystectomy  1995   .  Tonsillectomy and adenoidectomy     .  Cesarean section       FAMILY HISTORY Family History   Problem  Relation  Age of Onset   .  Heart disease  Father     .  Breast cancer  Mother     .  Heart disease  Mother     .  Diabetes  Paternal Grandfather     .  Hypertension  Maternal Grandmother     .  Hypertension  Brother     The patient's parents are in their mid-70s.Her mother has a remote history of breast cancer. She has two brothers, one her twin; no sisters. There is no other history of cancer in the family to her knowledge  GYNECOLOGIC HISTORY: Menarche age 83, first live birth age 56,  GX P2. Last period was January 2013, before that May 2012.  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 Her husband of 31 years, [name], graduated from [employer] and works in Regulatory affairs officer. Daughter [name] works for [employer] and as noted will be married June 1st. Daughter [name] studies business at [employer]. The patient attends the [employer]                    ADVANCED DIRECTIVES:  HEALTH MAINTENANCE: History   Substance Use Topics   .  Smoking status:  Never Smoker    .  Smokeless tobacco:  Never Used   .  Alcohol Use:  No                 Colonoscopy: 2009/[facility]             PAP: UTD             Bone density: never             Lipid panel: [place]/ "excellent"    Allergies    Allergen  Reactions   .  Wellbutrin (Bupropion Hcl)  Hives       Current Outpatient Prescriptions   Medication  Sig  Dispense  Refill   .  budesonide-formoterol (SYMBICORT) 160-4.5 MCG/ACT inhaler  Inhale 2 puffs into the lungs 2 (two) times daily.         .  budesonide-formoterol (SYMBICORT) 80-4.5 MCG/ACT inhaler  Inhale 2 puffs into the lungs 2 (two) times daily.   1 Inhaler   12   .  Ferrous Sulfate (IRON) 325 (65 FE) MG TABS  Take 1 tablet by mouth daily.           OBJECTIVE: midddle aged White woman who appears well Filed Vitals:     08/20/11 1609   BP:  148/77   Pulse:  109   Temp:  98.8 F (37.1 C)      Body mass index is 29.99 kg/(m^2).    ECOG FS: 0  Sclerae unicteric Oropharynx clear No peripheral adenopathy Lungs no rales or rhonchi Heart regular rate and rhythm Abd benign MSK no focal spinal tenderness, no peripheral edema Neuro: nonfocal Breasts: deferred  LAB RESULTS: Lab Results   Component  Value  Date     WBC  5.3  08/14/2011     NEUTROABS  3.4  08/14/2011     HGB  8.9*  08/14/2011     HCT  27.7*  08/14/2011     MCV  95.2  08/14/2011     PLT  310  08/14/2011         Chemistry        Component  Value  Date/Time     NA  141  08/14/2011 0923     K  4.2  08/14/2011 0923     CL  103  08/14/2011 0923     CO2  24  08/14/2011 0923     BUN  13  08/14/2011 0923     CREATININE  0.74  08/14/2011 0923        Component  Value  Date/Time     CALCIUM  10.1  08/14/2011 0923     ALKPHOS  86  08/14/2011 0923     AST  18  08/14/2011 0923     ALT  12  08/14/2011 0923     BILITOT  0.4  08/14/2011 2725  Urine protein/creatinine ratio 0.17 Urine IFE shows IgA-lambda and excess free lambda light chains Serum kappa light chains 0.03, lambda 106.00 Beta-2-microglobulin 5.51    No results found for this basename: LABCA2       No components found with this basename: LABCA125     No results found for this basename: INR:1;PROTIME:1 in the last 168 hours  Urinalysis No results found  for this basename: colorurine, appearanceur, labspec, phurine, glucoseu, hgbur, bilirubinur, ketonesur, proteinur, urobilinogen, nitrite, leukocytesur     STUDIES: Dg Chest 2 View  07/30/2011  *RADIOLOGY REPORT*  Clinical Data: Cough, shortness of breath  CHEST - 2 VIEW  Comparison: None  Findings: Enlargement of cardiac silhouette. Mediastinal contours normal. Slight pulmonary vascular congestion. Minimal peribronchial thickening. No pulmonary infiltrate, pleural effusion or pneumothorax. Bones demineralized with endplate spur formation thoracolumbar spine. Surgical of right upper quadrant question cholecystectomy.  IMPRESSION: Enlargement of cardiac silhouette with pulmonary vascular congestion. Minimal bronchitic changes.  Original Report Authenticated By: Lollie Marrow, M.D.     ASSESSMENT: 56 year-old [place] woman presenting with anemia, found to have an IgA >6g, with an M-spike of 4.5 g (and a second M-spike of 0.4g), urine IFE showing IgA-lambda and lambda light chains, creatinine 0.77, calcium 10.0, albumin 3.1, and beta-2-microglobulin 5.51  PLAN: these labs are consistent with multiple myeloma, stage 3, and I discussed her situation on that basis. She understands diagnosis requires a bone marrow biopsy and workup also will include a bone survey--these are being ordered for the next two weeks. She does not want to begin any treatment until after her daughter's wedding, so she will see me June 6th to discuss results of the upcoming tests and to start her treatment, which likely will consist of bortezomib sq, cyclophosphamide and dexamethasone po until remission, then high-dose chemotherapy with autologous stem-cell transpplant at [facility] She will call for results of the bone marrow biopsy in particular and I will request a visit with the [facility] transplant team. Today she completed paperwork for lenalidomide in case we decide to use that agent at some point.  This visit was understandably stressful  for Laura Hester and [name] and they know I will be glad to see them before June 6th if that would be helpful.   MAGRINAT,GUSTAV C    08/20/2011      Today's examination details :   Past Medical History  Diagnosis Date  . Fibrocystic breast disease   . Allergic rhinitis   . HTN (hypertension)   . Prediabetes   . SUI (stress urinary incontinence, female)   . Obesity   . Glaucoma   . Asthma   . Multiple myeloma, stage 3   . Anemia     Past Surgical History  Procedure Date  . Cholecystectomy 1995  . Tonsillectomy and adenoidectomy   . Cesarean section   . Carpal tunnel release     Family History  Problem Relation Age of Onset  . Heart disease Father   . Breast cancer Mother   . Heart disease Mother   . Diabetes Paternal Grandfather   . Hypertension Maternal Grandmother   . Hypertension Brother    Social History:  reports that she has never smoked. She has never used smokeless tobacco. She reports that she does not drink alcohol or use illicit drugs.  Allergies:  Allergies  Allergen Reactions  . Wellbutrin (Bupropion Hcl) Hives    Review of Systems  Constitutional: Positive for weight loss. Negative for fever, chills and diaphoresis.  Respiratory: Positive  for cough. Negative for hemoptysis, sputum production and shortness of breath.   Cardiovascular: Negative.   Gastrointestinal: Negative.   Genitourinary: Negative.   Skin: Negative.   Neurological: Negative.  Negative for weakness.  Endo/Heme/Allergies: Negative.   Psychiatric/Behavioral: Negative.    Last menstrual period 04/15/2011. Physical Exam  Constitutional: She is oriented to person, place, and time. She appears well-developed and well-nourished. No distress.  HENT:  Head: Normocephalic and atraumatic.  Eyes: Pupils are equal, round, and reactive to light.  Neck: Normal range of motion. Neck supple.  Cardiovascular: Normal rate, regular rhythm and intact distal pulses.  Exam reveals no gallop and no  friction rub.   Murmur heard. Respiratory: Effort normal and breath sounds normal. No respiratory distress. She has no wheezes. She has no rales.  GI: Soft. Bowel sounds are normal.  Musculoskeletal: Normal range of motion. She exhibits no edema.  Neurological: She is alert and oriented to person, place, and time.  Skin: Skin is warm and dry. She is not diaphoretic.  Psychiatric: She has a normal mood and affect. Her behavior is normal. Judgment and thought content normal.     Assessment/Plan [procedure details for bone marrow needle core biopsy discussed in detail with patient and spouse with their apparent understanding. Potential complications discussed specifically were that of bleeding, hematoma, inadequate sampling and complications with moderate sedation.  All their questions were answered to their satisfaction and written consent obtained.   Honestie Kulik D 09/01/2011, 9:03 AM

## 2011-09-15 ENCOUNTER — Other Ambulatory Visit: Payer: Self-pay | Admitting: Oncology

## 2011-09-15 ENCOUNTER — Ambulatory Visit (HOSPITAL_COMMUNITY)
Admission: RE | Admit: 2011-09-15 | Discharge: 2011-09-15 | Disposition: A | Discharge status: Home / Self Care | Payer: BC Managed Care – PPO | Source: Ambulatory Visit | Attending: Oncology | Admitting: Oncology

## 2011-09-15 ENCOUNTER — Encounter: Payer: Self-pay | Admitting: Physician Assistant

## 2011-09-15 ENCOUNTER — Other Ambulatory Visit: Payer: BC Managed Care – PPO

## 2011-09-15 ENCOUNTER — Ambulatory Visit (HOSPITAL_BASED_OUTPATIENT_CLINIC_OR_DEPARTMENT_OTHER): Payer: BC Managed Care – PPO | Admitting: Lab

## 2011-09-15 ENCOUNTER — Other Ambulatory Visit: Payer: Self-pay | Admitting: Physician Assistant

## 2011-09-15 ENCOUNTER — Other Ambulatory Visit: Payer: Self-pay | Admitting: *Deleted

## 2011-09-15 ENCOUNTER — Ambulatory Visit (HOSPITAL_BASED_OUTPATIENT_CLINIC_OR_DEPARTMENT_OTHER): Payer: BC Managed Care – PPO | Admitting: Physician Assistant

## 2011-09-15 ENCOUNTER — Telehealth: Payer: Self-pay | Admitting: *Deleted

## 2011-09-15 VITALS — BP 151/75 | HR 103 | Temp 99.0°F | Ht 62.5 in | Wt 165.0 lb

## 2011-09-15 DIAGNOSIS — J45909 Unspecified asthma, uncomplicated: Secondary | ICD-10-CM

## 2011-09-15 DIAGNOSIS — C9000 Multiple myeloma not having achieved remission: Secondary | ICD-10-CM

## 2011-09-15 DIAGNOSIS — M810 Age-related osteoporosis without current pathological fracture: Secondary | ICD-10-CM

## 2011-09-15 DIAGNOSIS — I517 Cardiomegaly: Secondary | ICD-10-CM | POA: Insufficient documentation

## 2011-09-15 DIAGNOSIS — R0602 Shortness of breath: Secondary | ICD-10-CM | POA: Insufficient documentation

## 2011-09-15 DIAGNOSIS — R079 Chest pain, unspecified: Secondary | ICD-10-CM

## 2011-09-15 DIAGNOSIS — D649 Anemia, unspecified: Secondary | ICD-10-CM

## 2011-09-15 LAB — CBC WITH DIFFERENTIAL/PLATELET
Basophils Absolute: 0 10*3/uL (ref 0.0–0.1)
EOS%: 1.1 % (ref 0.0–7.0)
Eosinophils Absolute: 0.1 10*3/uL (ref 0.0–0.5)
HCT: 25.3 % — ABNORMAL LOW (ref 34.8–46.6)
HGB: 8.1 g/dL — ABNORMAL LOW (ref 11.6–15.9)
MCH: 30.1 pg (ref 25.1–34.0)
MCV: 94.3 fL (ref 79.5–101.0)
MONO%: 6.4 % (ref 0.0–14.0)
NEUT%: 72.5 % (ref 38.4–76.8)

## 2011-09-15 LAB — COMPREHENSIVE METABOLIC PANEL
Albumin: 3 g/dL — ABNORMAL LOW (ref 3.5–5.2)
Alkaline Phosphatase: 85 U/L (ref 39–117)
BUN: 11 mg/dL (ref 6–23)
CO2: 24 mEq/L (ref 19–32)
Glucose, Bld: 101 mg/dL — ABNORMAL HIGH (ref 70–99)
Potassium: 3.8 mEq/L (ref 3.5–5.3)
Total Bilirubin: 0.3 mg/dL (ref 0.3–1.2)
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
## 2011-09-15 NOTE — Telephone Encounter (Signed)
Pt called to this RN to state ongoing and increasing " pain in my lungs ". Laura Hester is unable to lay flat and is sleeping on 2 pillows.   Pain is somewhat relieved with use of tramadol and inhaler.  Per discussion and review with MD pt will obtain a CXR and see AB/PA today.

## 2011-09-16 ENCOUNTER — Other Ambulatory Visit: Payer: Self-pay | Admitting: Oncology

## 2011-09-16 ENCOUNTER — Other Ambulatory Visit: Payer: Self-pay | Admitting: Physician Assistant

## 2011-09-19 ENCOUNTER — Other Ambulatory Visit (HOSPITAL_BASED_OUTPATIENT_CLINIC_OR_DEPARTMENT_OTHER): Payer: BC Managed Care – PPO

## 2011-09-19 ENCOUNTER — Telehealth: Payer: Self-pay | Admitting: *Deleted

## 2011-09-19 ENCOUNTER — Ambulatory Visit (HOSPITAL_BASED_OUTPATIENT_CLINIC_OR_DEPARTMENT_OTHER): Payer: BC Managed Care – PPO

## 2011-09-19 ENCOUNTER — Other Ambulatory Visit: Payer: Self-pay | Admitting: Obstetrics & Gynecology

## 2011-09-19 ENCOUNTER — Other Ambulatory Visit: Payer: Self-pay | Admitting: *Deleted

## 2011-09-19 ENCOUNTER — Ambulatory Visit (HOSPITAL_BASED_OUTPATIENT_CLINIC_OR_DEPARTMENT_OTHER): Payer: BC Managed Care – PPO | Admitting: Oncology

## 2011-09-19 VITALS — BP 152/80 | HR 96 | Temp 98.9°F | Ht 62.5 in | Wt 165.6 lb

## 2011-09-19 DIAGNOSIS — C9000 Multiple myeloma not having achieved remission: Secondary | ICD-10-CM

## 2011-09-19 DIAGNOSIS — Z1231 Encounter for screening mammogram for malignant neoplasm of breast: Secondary | ICD-10-CM

## 2011-09-19 DIAGNOSIS — D649 Anemia, unspecified: Secondary | ICD-10-CM

## 2011-09-19 DIAGNOSIS — I10 Essential (primary) hypertension: Secondary | ICD-10-CM

## 2011-09-19 DIAGNOSIS — Z803 Family history of malignant neoplasm of breast: Secondary | ICD-10-CM

## 2011-09-19 LAB — CBC WITH DIFFERENTIAL/PLATELET
Basophils Absolute: 0 10*3/uL (ref 0.0–0.1)
Eosinophils Absolute: 0.1 10*3/uL (ref 0.0–0.5)
HGB: 8.5 g/dL — ABNORMAL LOW (ref 11.6–15.9)
MCV: 93.3 fL (ref 79.5–101.0)
MONO#: 0.3 10*3/uL (ref 0.1–0.9)
MONO%: 6 % (ref 0.0–14.0)
NEUT#: 3.5 10*3/uL (ref 1.5–6.5)
RBC: 2.83 10*6/uL — ABNORMAL LOW (ref 3.70–5.45)
RDW: 21.9 % — ABNORMAL HIGH (ref 11.2–14.5)
WBC: 5.1 10*3/uL (ref 3.9–10.3)

## 2011-09-19 LAB — HOLD TUBE, BLOOD BANK

## 2011-09-19 MED ORDER — SODIUM CHLORIDE 0.9 % IV SOLN
Freq: Once | INTRAVENOUS | Status: AC
  Administered 2011-09-19: 12:00:00 via INTRAVENOUS

## 2011-09-19 MED ORDER — WARFARIN SODIUM 5 MG PO TABS: 5.0000 mg | ORAL_TABLET | Freq: Every day | ORAL | Status: DC

## 2011-09-19 MED ORDER — ACYCLOVIR 400 MG PO TABS: 400.0000 mg | ORAL_TABLET | ORAL | Status: DC

## 2011-09-19 MED ORDER — ZOLEDRONIC ACID 4 MG/100ML IV SOLN
4.0000 mg | Freq: Once | INTRAVENOUS | Status: AC
  Administered 2011-09-19: 4 mg via INTRAVENOUS
  Filled 2011-09-19: qty 100

## 2011-09-19 MED ORDER — DEXAMETHASONE 4 MG PO TABS: 4.0000 mg | ORAL_TABLET | Freq: Two times a day (BID) | ORAL | Status: DC

## 2011-09-19 MED ORDER — DEXAMETHASONE 4 MG PO TABS: 40.0000 mg | ORAL_TABLET | ORAL | Status: AC

## 2011-09-19 MED ORDER — SULFAMETHOXAZOLE-TRIMETHOPRIM 800-160 MG PO TABS: 1.0000 | ORAL_TABLET | Freq: Two times a day (BID) | ORAL | Status: AC

## 2011-09-19 MED ORDER — ACYCLOVIR 400 MG PO TABS: 400.0000 mg | ORAL_TABLET | Freq: Two times a day (BID) | ORAL | Status: AC

## 2011-09-19 MED ORDER — LENALIDOMIDE 25 MG PO CAPS: 25.0000 mg | ORAL_CAPSULE | Freq: Every day | ORAL | Status: DC

## 2011-09-19 NOTE — Progress Notes (Signed)
ID: Laura Hester   DOB: [birth year]  MR#: [id number]  CSN#:[id number]  HISTORY OF PRESENT ILLNESS: Mahaley has a history of iron deficiency anemia secondary to menorrhagia. However, as this persisted despite near-total cessation of her menses and despite iron supplementation, she was referred to Dr. Dorena Cookey for GI evaluation. He found the patient's Hb to have decreased from 9.17 June 2011 to 8.3 07/29/2011. MCV was 95.9, ferritin 93.3, with a normal folate and negative ANA. Guaiacs were negative. Creatinine was 0.77.  t-transglutaminase IgA was normal at 2, but the total IgA was 6.9 g. Accordingly on 08/04/2011 an SPEP was obtained, showing an M-spike of 4.5 g, with a secondary M-spike of 0.4 g. Total protein was 10.0 with albumin 3.2, calcium 10.2. Bone survey 08/26/2011 showed multiple bone lesions consistent with myeloma and bone marrow biopsy 09/01/2011 confirmed the diagnosis with 75% myeloma cells in the marrow. [name]' subsequent history is as detailed below.  INTERVAL HISTORY: Laura Hester comes today with her husband [name] to discuss treatment planning for her multiple myeloma. She had postponed therapy because of her older daughter's wedding 09/13/2011. The wedding went "great," though she did experience some palpitations. She feels she is now "ready to start work" on the cancer problem.  REVIEW OF SYSTEMS: Aside from occasional palpitations, she has had no chest pain or pressure, no shortness of breath, no dizziness or gait imbalance. There have been no intercurrent fever or bleeding problems. She does feel mildly fatigued. She still has a little bit of a dry cough from her prior bronchitis. A detailed review of systems today was otherwise noncontributory  PAST MEDICAL HISTORY: Past Medical History  Diagnosis Date  . Fibrocystic breast disease   . Allergic rhinitis   . HTN (hypertension)   . Prediabetes   . SUI (stress urinary incontinence, female)   . Obesity   . Glaucoma   .  Asthma   . Multiple myeloma, stage 3   . Anemia     PAST SURGICAL HISTORY: Past Surgical History  Procedure Date  . Cholecystectomy 1995  . Tonsillectomy and adenoidectomy   . Cesarean section   . Carpal tunnel release     FAMILY HISTORY Family History  Problem Relation Age of Onset  . Heart disease Father   . Breast cancer Mother   . Heart disease Mother   . Diabetes Paternal Grandfather   . Hypertension Maternal Grandmother   . Hypertension Brother   The patient's parents are in their mid-70s.Her mother has a remote history of breast cancer. She has two brothers, one her twin; no sisters. There is no other history of cancer in the family to her knowledge  GYNECOLOGIC HISTORY: Menarche age 56, first live birth age 56, GX P52. Last period was January 2013, before that May 2012.  SOCIAL HISTORY: She is a Software engineer and taught kindergarten until 2012. Her husband of 31 years, [name], graduated from [employer] and works in Regulatory affairs officer. Daughter Laura Hester works for [employer] and as noted was married June 1st. Daughter Laura Hester studies business at [employer]. The patient attends the [employer]   ADVANCED DIRECTIVES:  HEALTH MAINTENANCE: History  Substance Use Topics  . Smoking status: Never Smoker   . Smokeless tobacco: Never Used  . Alcohol Use: No     Colonoscopy: 2009/[facility]  PAP: UTD  Bone density: never  Lipid panel: Swayne/ "excellent"  Allergies  Allergen Reactions  . Wellbutrin (Bupropion Hcl) Hives    Current Outpatient Prescriptions  Medication  Sig Dispense Refill  . budesonide-formoterol (SYMBICORT) 80-4.5 MCG/ACT inhaler Inhale 2 puffs into the lungs 2 (two) times daily.  1 Inhaler  12  . Ferrous Sulfate (IRON) 325 (65 FE) MG TABS Take 1 tablet by mouth daily.      . naproxen sodium (ANAPROX) 220 MG tablet Take 220 mg by mouth 2 (two) times daily with a meal.      . traMADol (ULTRAM) 50 MG tablet Take 50 mg by mouth every 6 (six)  hours as needed.      Marland Kitchen acyclovir (ZOVIRAX) 400 MG tablet Take 1 tablet (400 mg total) by mouth 2 (two) times daily.  60 tablet  12  . dexamethasone (DECADRON) 4 MG tablet Take 10 tablets (40 mg total) by mouth once a week.  40 tablet  6  . lenalidomide (REVLIMID) 25 MG capsule Take 1 capsule (25 mg total) by mouth daily.  35 capsule  12  . sulfamethoxazole-trimethoprim (BACTRIM DS,SEPTRA DS) 800-160 MG per tablet Take 1 tablet by mouth 2 (two) times daily.  30 tablet  6  . warfarin (COUMADIN) 5 MG tablet Take 1 tablet (5 mg total) by mouth daily.  30 tablet  12   No current facility-administered medications for this visit.   Facility-Administered Medications Ordered in Other Visits  Medication Dose Route Frequency Provider Last Rate Last Dose  . 0.9 %  sodium chloride infusion   Intravenous Once Catalina Gravel, PA      . Zoledronic Acid (ZOMETA) 4 mg IVPB  4 mg Intravenous Once Catalina Gravel, PA   4 mg at 09/19/11 1214    OBJECTIVE: midddle aged White woman in no acute distress Filed Vitals:   09/19/11 1031  BP: 152/80  Pulse: 96  Temp: 98.9 F (37.2 C)     Body mass index is 29.81 kg/(m^2).    ECOG FS: 1  Sclerae unicteric Oropharynx clear No cervical or supraclavicular adenopathy Lungs no rales or rhonchi Heart regular rate and rhythm Abd benign MSK no focal spinal tenderness, no peripheral edema Neuro: nonfocal Breasts: deferred  LAB RESULTS: Lab Results  Component Value Date   WBC 5.1 09/19/2011   NEUTROABS 3.5 09/19/2011   HGB 8.5* 09/19/2011   HCT 26.4* 09/19/2011   MCV 93.3 09/19/2011   PLT 264 09/19/2011      Chemistry      Component Value Date/Time   NA 142 09/15/2011 1124   K 3.8 09/15/2011 1124   CL 102 09/15/2011 1124   CO2 24 09/15/2011 1124   BUN 11 09/15/2011 1124   CREATININE 0.66 09/15/2011 1124      Component Value Date/Time   CALCIUM 10.1 09/15/2011 1124   ALKPHOS 85 09/15/2011 1124   AST 19 09/15/2011 1124   ALT 10 09/15/2011 1124   BILITOT 0.3 09/15/2011 1124     Urine  protein/creatinine ratio 0.17 Urine IFE shows IgA-lambda and excess free lambda light chains Serum kappa light chains 0.03, lambda 106.00 Beta-2-microglobulin 5.51  No results found for this basename: LABCA2    No components found with this basename: LABCA125    No results found for this basename: INR:1;PROTIME:1 in the last 168 hours  Urinalysis No results found for this basename: colorurine,  appearanceur,  labspec,  phurine,  glucoseu,  hgbur,  bilirubinur,  ketonesur,  proteinur,  urobilinogen,  nitrite,  leukocytesur    STUDIES: METASTATIC BONE SURVEY  Comparison: None  Findings: There are scattered small lucent lesions in the axial and  appendicular skeleton  consistent with multiple myeloma. This is  most notable in the skull. No pathologic fracture. Degenerative  changes noted mainly in the lumbar spine.  IMPRESSION:  Diffuse scattered myelomatous bone lesions in the axial and  appendicular skeleton.  Original Report Authenticated By: P. Loralie Champagne, M.D.  Patient Name: FATOUMATA, ALBAUGH Accession #: [id number] DOB: September 15, 1955 Age: 56 Gender: F Client Name [facility] Collected Date: 09/01/2011 Received Date: 09/01/2011 Physician: Richarda Overlie Chart #: MRN # : [id number] Physician cc: Ruthann Cancer, MD Race:W Visit #: [id number] BONE MARROW REPORT FINAL DIAGNOSIS Diagnosis Bone Marrow, Aspirate,Biopsy, and Clot, right iliac - HYPERCELLULAR BONE MARROW WITH PLASMA CELL MYELOMA (PLASMA CELLS 75%). PERIPHERAL BLOOD: - NORMOCYTIC-NORMOCHROMIC ANEMIA. Guerry Bruin MD Pathologist, Electronic Signature (Case signed 09/03/2011) GROSS AND MICROSCOPIC INFORMATION Specimen Clinical Information Evaluate for multiple myeloma. (lw) Source Bone Marrow, Aspirate,Biopsy, and Clot, right iliac Microscopic LAB DATA: CBC performed on 09/01/2011 shows: WBC 4.81 K/ul Neutrophils 69% HB 8.7 g/dl Lymphocytes 30% HCT 86.5 % Monocytes 8% MCV 95.4 fL Eosinophils 0% RDW  20.5 % Basophils 0% PLT 303 K/ul PERIPHERAL BLOOD SMEAR: The red blood cells display prominent anisocytosis with normocytic and macrocytic cells. There is mild poikilocytosis with elliptocytes and teardrop cells. There is mild polychromasia. There is rouleaux formation. Some of the neutrophils display mild toxic granulation but there is no significant neutrophilic left shift. The platelets are abundant and appear normogranular. 1 of 3 FINAL for CHARLESE, GRUETZMACHER ([id number]) Microscopic(continued) BONE MARROW ASPIRATE: Erythroid precursors: Progressive maturation with nuclear cytoplasmic dyssynchrony or irregular/lobulated nuclei. Granulocytic precursors: Orderly and progressive maturation for the most part. Megakaryocytes: Abundant with predominately normal morphology. Lymphocytes/plasma cells: The plasma cell are markedly increased in number representing 75% of all cells with prominent atypical cytologic features characterized by cytomegaly, fine chromatin, and prominent nucleoli. Significant lymphoid aggregates are not present. TOUCH PREPARATIONS: Abundance of atypical plasma cells present. CLOT and BIOPSY: The sections show 80 to 100% cellularity. The medullary space is diffusely infiltrated by atypical plasma cells characterized by vesicular chromatin and prominent nucleoli. Residual myeloid hematopoiesis is seen in the background. Immunohistochemical stains for CD138, kappa and lambda were performed on clot and biopsy. CD138 highlights the abundant plasma cells. Kappa and lambda stains show lambda light chain restriction within plasma cells. IRON STAIN: Iron stains are performed on a bone marrow aspirate smear and section of clot. The controls stained appropriately. Storage Iron: Decreased. Ringed Sideroblasts: Absent. ADDITIONAL DATA / TESTING: Specimen was sent for cytogenetic analysis and a separate report will follow. (BNS:gt, 09/02/11) Specimen Table Bone Marrow count  performed on 500 cells shows: Blasts: 0% Myeloid 8% Promyelocyts: 0% Myelocytes: 1% Erythroid 12% Metamyelocyts: 1% Bands: 4% Lymphocytes: 5% Neutrophils: 1% Eosinophils: 1% Plasma Cells: 75% Basophils: 0% Monocytes: 0% M:E ratio: 0.72 FINAL for LYNSEY, ANGE ([id number])  ASSESSMENT: 56 year-old [place] woman presenting with anemia, found to have an IgA >6g, with an M-spike of 4.5 g (and a second M-spike of 0.4g), urine IFE showing IgA-lambda and lambda light chains, creatinine 0.77, calcium 10.0, albumin 3.1, and beta-2-microglobulin 5.51; with bone survey showing multiple myeloma lesions and bone marrow biopsy 09/01/2011 showing 75% myeloma cells. Cytogenetics were normal, FISH probes pending.  PLAN: Today we spent the better part of her 90 minute visit discussing her diagnosis and the general treatment plan. We will start with lenalidomide/ bortezomib/ dexamethasone for remission induction, and she will be evaluated at [facility] for consideration of autologous transplant, hopefully October or November of this year. We discussed the  possible toxicities, side effects and complications of her treatment plan including infectious and clotting of complications.  Specifically she will receive lenalidomide 25 mg daily, 14 days on and 7 days off; dexamethasone 40 mg weekly, orally; and bortezomib subcutaneously weekly. We will follow her labwork on a Q. three-week basis. We will repeat a bone marrow biopsy after optimization of her SPEP and urine studies. Prophylactically she will see her dentist, and she will start on acyclovir 400 mg by mouth twice a day, Septra DS every Monday Wednesday and Friday, warfarin 5 mg daily and omeprazole nightly. She will also receive zoledronic acid every 4 weeks.   I encouraged her to go ahead and get her mammography done as scheduled later this month. She will see [place] every 2 weeks for management of side effects, or more frequently if necessary. She will let us know  if she does not receive the lenalidomide by late next week.  Caylan Schifano C    09/19/2011

## 2011-09-19 NOTE — Telephone Encounter (Signed)
{

## 2011-09-19 NOTE — Telephone Encounter (Signed)
gve the pt her June 2013 appt calendar. Pt is aware that her tx's will be added. Sent michelle a staff message

## 2011-09-20 ENCOUNTER — Other Ambulatory Visit: Payer: Self-pay | Admitting: Oncology

## 2011-09-22 ENCOUNTER — Other Ambulatory Visit: Payer: Self-pay | Admitting: Oncology

## 2011-09-22 ENCOUNTER — Ambulatory Visit (HOSPITAL_BASED_OUTPATIENT_CLINIC_OR_DEPARTMENT_OTHER): Payer: BC Managed Care – PPO

## 2011-09-22 ENCOUNTER — Other Ambulatory Visit: Payer: Self-pay | Admitting: Pharmacist

## 2011-09-22 ENCOUNTER — Other Ambulatory Visit (HOSPITAL_BASED_OUTPATIENT_CLINIC_OR_DEPARTMENT_OTHER): Payer: BC Managed Care – PPO

## 2011-09-22 VITALS — BP 104/55 | HR 80 | Temp 98.6°F

## 2011-09-22 DIAGNOSIS — H401190 Primary open-angle glaucoma, unspecified eye, stage unspecified: Secondary | ICD-10-CM

## 2011-09-22 DIAGNOSIS — C9000 Multiple myeloma not having achieved remission: Secondary | ICD-10-CM

## 2011-09-22 DIAGNOSIS — E8809 Other disorders of plasma-protein metabolism, not elsewhere classified: Secondary | ICD-10-CM

## 2011-09-22 DIAGNOSIS — D689 Coagulation defect, unspecified: Secondary | ICD-10-CM

## 2011-09-22 DIAGNOSIS — Z5112 Encounter for antineoplastic immunotherapy: Secondary | ICD-10-CM

## 2011-09-22 LAB — PROTIME-INR
INR: 1.4 — ABNORMAL LOW (ref 2.00–3.50)
Protime: 16.8 Seconds — ABNORMAL HIGH (ref 10.6–13.4)

## 2011-09-22 LAB — CBC WITH DIFFERENTIAL/PLATELET
Basophils Absolute: 0 10*3/uL (ref 0.0–0.1)
EOS%: 2 % (ref 0.0–7.0)
HCT: 23.8 % — ABNORMAL LOW (ref 34.8–46.6)
HGB: 7.2 g/dL — ABNORMAL LOW (ref 11.6–15.9)
MONO#: 0.3 10*3/uL (ref 0.1–0.9)
NEUT#: 2.4 10*3/uL (ref 1.5–6.5)
NEUT%: 61.2 % (ref 38.4–76.8)
RDW: 21.2 % — ABNORMAL HIGH (ref 11.2–14.5)
WBC: 3.9 10*3/uL (ref 3.9–10.3)
lymph#: 1.1 10*3/uL (ref 0.9–3.3)

## 2011-09-22 MED ORDER — ONDANSETRON HCL 8 MG PO TABS
8.0000 mg | ORAL_TABLET | Freq: Once | ORAL | Status: AC
  Administered 2011-09-22: 8 mg via ORAL

## 2011-09-22 MED ORDER — BORTEZOMIB CHEMO SQ INJECTION 3.5 MG (2.5MG/ML)
1.3000 mg/m2 | Freq: Once | INTRAMUSCULAR | Status: AC
  Administered 2011-09-22: 2.25 mg via SUBCUTANEOUS
  Filled 2011-09-22: qty 2.25

## 2011-09-22 NOTE — Progress Notes (Signed)
1015-Dr. Magrinat notified of CBC results from this morning.  Order received to continue plan as ordered with SQ Velcade.  No need for transfusion today per Dr. Darnelle Catalan.

## 2011-09-22 NOTE — Patient Instructions (Signed)
[facility] Discharge Instructions for Patients Receiving Chemotherapy  Today you received the following chemotherapy agents Velcade.  To help prevent nausea and vomiting after your treatment, we encourage you to take your nausea medication as ordered per MD.    If you develop nausea and vomiting that is not controlled by your nausea medication, call the clinic. If it is after clinic hours your family physician or the after hours number for the clinic or go to the Emergency Department.   BELOW ARE SYMPTOMS THAT SHOULD BE REPORTED IMMEDIATELY:  *FEVER GREATER THAN 100.5 F  *CHILLS WITH OR WITHOUT FEVER  NAUSEA AND VOMITING THAT IS NOT CONTROLLED WITH YOUR NAUSEA MEDICATION  *UNUSUAL SHORTNESS OF BREATH  *UNUSUAL BRUISING OR BLEEDING  TENDERNESS IN MOUTH AND THROAT WITH OR WITHOUT PRESENCE OF ULCERS  *URINARY PROBLEMS  *BOWEL PROBLEMS  UNUSUAL RASH Items with * indicate a potential emergency and should be followed up as soon as possible.  One of the nurses will contact you 24 hours after your treatment. Please let the nurse know about any problems that you may have experienced. Feel free to call the clinic you have any questions or concerns. The clinic phone number is [phone].   I have been informed and understand all the instructions given to me. I know to contact the clinic, my physician, or go to the Emergency Department if any problems should occur. I do not have any questions at this time, but understand that I may call the clinic during office hours   should I have any questions or need assistance in obtaining follow up care.    __________________________________________  _____________  __________ Signature of Patient or Authorized Representative            Date                   Time    __________________________________________ Nurse's Signature    

## 2011-09-23 ENCOUNTER — Telehealth: Payer: Self-pay | Admitting: *Deleted

## 2011-09-23 ENCOUNTER — Encounter: Payer: Self-pay | Admitting: *Deleted

## 2011-09-23 NOTE — Telephone Encounter (Signed)
AWAITING PT.'S RETURN CALL.

## 2011-09-23 NOTE — Progress Notes (Signed)
RECEIVED A FAX FROM [facility] CONCERNING A CONFIRMATION OF PRESCRIPTION SHIPMENT FOR REVLIMID. 

## 2011-09-24 ENCOUNTER — Telehealth: Payer: Self-pay | Admitting: Medical Oncology

## 2011-09-24 NOTE — Telephone Encounter (Addendum)
Spoke to pt. She reports some redness around the Velcade  injection site on her abdomen, insomnia and a new soft bruise " 3"x2in"  behind her knee. She sat in a different recliner yesterday . I told her the redness on abdomen is normal , that the insomnia is secondary to steroids and to keep leg elevated , avoid bending knee as much as possible. I told her to keep her lab apointment tomorrow and I will ask Dr Darrall Dears nurse to look at her bruise tomorrow.

## 2011-09-25 ENCOUNTER — Other Ambulatory Visit (HOSPITAL_BASED_OUTPATIENT_CLINIC_OR_DEPARTMENT_OTHER): Payer: BC Managed Care – PPO | Admitting: Lab

## 2011-09-25 ENCOUNTER — Encounter: Payer: Self-pay | Admitting: *Deleted

## 2011-09-25 DIAGNOSIS — D689 Coagulation defect, unspecified: Secondary | ICD-10-CM

## 2011-09-25 DIAGNOSIS — C9000 Multiple myeloma not having achieved remission: Secondary | ICD-10-CM

## 2011-09-25 LAB — CBC WITH DIFFERENTIAL/PLATELET
Basophils Absolute: 0 10*3/uL (ref 0.0–0.1)
EOS%: 3.3 % (ref 0.0–7.0)
HGB: 7.2 g/dL — ABNORMAL LOW (ref 11.6–15.9)
MCH: 29.3 pg (ref 25.1–34.0)
MCV: 93.7 fL (ref 79.5–101.0)
MONO%: 4.9 % (ref 0.0–14.0)
NEUT%: 72.9 % (ref 38.4–76.8)
RDW: 23 % — ABNORMAL HIGH (ref 11.2–14.5)

## 2011-09-25 LAB — PROTIME-INR

## 2011-09-25 NOTE — Progress Notes (Signed)
PER MD, NOTIFIED PT TO CONTINUE PRESENT DOSE OF 5MG  COUMADIN. HAVE NOTIFIED LAB TO CANCEL THE TYPE AND HOLD

## 2011-09-26 ENCOUNTER — Ambulatory Visit
Admission: RE | Admit: 2011-09-26 | Discharge: 2011-09-26 | Disposition: A | Discharge status: Home / Self Care | Payer: BC Managed Care – PPO | Source: Ambulatory Visit | Attending: Obstetrics & Gynecology | Admitting: Obstetrics & Gynecology

## 2011-09-26 DIAGNOSIS — Z1231 Encounter for screening mammogram for malignant neoplasm of breast: Secondary | ICD-10-CM

## 2011-09-29 ENCOUNTER — Encounter: Payer: Self-pay | Admitting: Physician Assistant

## 2011-09-29 ENCOUNTER — Ambulatory Visit (HOSPITAL_BASED_OUTPATIENT_CLINIC_OR_DEPARTMENT_OTHER): Payer: BC Managed Care – PPO | Admitting: Physician Assistant

## 2011-09-29 ENCOUNTER — Other Ambulatory Visit (HOSPITAL_BASED_OUTPATIENT_CLINIC_OR_DEPARTMENT_OTHER): Payer: BC Managed Care – PPO | Admitting: Lab

## 2011-09-29 ENCOUNTER — Telehealth: Payer: Self-pay | Admitting: Oncology

## 2011-09-29 ENCOUNTER — Ambulatory Visit (HOSPITAL_BASED_OUTPATIENT_CLINIC_OR_DEPARTMENT_OTHER): Payer: BC Managed Care – PPO

## 2011-09-29 ENCOUNTER — Other Ambulatory Visit: Payer: BC Managed Care – PPO | Admitting: Lab

## 2011-09-29 VITALS — BP 168/71 | HR 108 | Temp 99.0°F | Ht 62.5 in | Wt 163.9 lb

## 2011-09-29 DIAGNOSIS — N920 Excessive and frequent menstruation with regular cycle: Secondary | ICD-10-CM

## 2011-09-29 DIAGNOSIS — D509 Iron deficiency anemia, unspecified: Secondary | ICD-10-CM

## 2011-09-29 DIAGNOSIS — Z5112 Encounter for antineoplastic immunotherapy: Secondary | ICD-10-CM

## 2011-09-29 DIAGNOSIS — C9000 Multiple myeloma not having achieved remission: Secondary | ICD-10-CM

## 2011-09-29 LAB — PROTHROMBIN TIME
INR: 2.1 — ABNORMAL HIGH (ref <1.50)
Prothrombin Time: 23.9 seconds — ABNORMAL HIGH (ref 11.6–15.2)

## 2011-09-29 LAB — CBC WITH DIFFERENTIAL/PLATELET
BASO%: 0.5 % (ref 0.0–2.0)
EOS%: 4.2 % (ref 0.0–7.0)
HCT: 26.8 % — ABNORMAL LOW (ref 34.8–46.6)
MCH: 29.3 pg (ref 25.1–34.0)
MCHC: 31.8 g/dL (ref 31.5–36.0)
MONO#: 0.3 10*3/uL (ref 0.1–0.9)
RDW: 22.8 % — ABNORMAL HIGH (ref 11.2–14.5)
WBC: 5.9 10*3/uL (ref 3.9–10.3)
lymph#: 1.2 10*3/uL (ref 0.9–3.3)

## 2011-09-29 LAB — PROTEIN / CREATININE RATIO, URINE
Creatinine, Urine: 69.5 mg/dL
Total Protein, Urine: 23 mg/dL

## 2011-09-29 MED ORDER — ONDANSETRON HCL 8 MG PO TABS
8.0000 mg | ORAL_TABLET | Freq: Once | ORAL | Status: AC
  Administered 2011-09-29: 8 mg via ORAL

## 2011-09-29 MED ORDER — BORTEZOMIB CHEMO SQ INJECTION 3.5 MG (2.5MG/ML)
1.3000 mg/m2 | Freq: Once | INTRAMUSCULAR | Status: AC
  Administered 2011-09-29: 2.25 mg via SUBCUTANEOUS
  Filled 2011-09-29: qty 2.25

## 2011-09-29 MED ORDER — OMEPRAZOLE 40 MG PO CPDR: 40.0000 mg | DELAYED_RELEASE_CAPSULE | Freq: Every day | ORAL | Status: DC

## 2011-09-29 MED ORDER — LORAZEPAM 0.5 MG PO TABS: 0.5000 mg | ORAL_TABLET | Freq: Every evening | ORAL | Status: DC | PRN

## 2011-09-29 NOTE — Telephone Encounter (Signed)
gve the pt her June,july 2013 appt calendar 

## 2011-09-29 NOTE — Patient Instructions (Signed)
Patient aware of next appointment; discharged home with daughter and no complaints. 

## 2011-09-29 NOTE — Progress Notes (Signed)
ID: SHREENA BAINES   DOB: [birth year]  MR#: [id number]  CSN#:[id number]  HISTORY OF PRESENT ILLNESS: Laura Hester has a history of iron deficiency anemia secondary to menorrhagia. However, as this persisted despite near-total cessation of her menses and despite iron supplementation, she was referred to Dr. Dorena Cookey for GI evaluation. He found the patient's Hb to have decreased from 9.17 June 2011 to 8.3 07/29/2011. MCV was 95.9, ferritin 93.3, with a normal folate and negative ANA. Guaiacs were negative. Creatinine was 0.77.  t-transglutaminase IgA was normal at 2, but the total IgA was 6.9 g. Accordingly on 08/04/2011 an SPEP was obtained, showing an M-spike of 4.5 g, with a secondary M-spike of 0.4 g. Total protein was 10.0 with albumin 3.2, calcium 10.2. Bone survey 08/26/2011 showed multiple bone lesions consistent with myeloma and bone marrow biopsy 09/01/2011 confirmed the diagnosis with 75% myeloma cells in the marrow. [name] subsequent history is as detailed below.  INTERVAL HISTORY: Laura Hester comes today with her husband [name] for followup of her multiple myeloma, due for her second weekly injection of Velcade today. She began her oral dexamethasone last week, first dose on 09/23/2011. She tolerated the steroid well, but did have sleeplessness at night. She also started the Revlimid on 09/23/2011, 25 mg daily. (She'll continue for 14 days, then take 7 days off.)    Interval history is really quite unremarkable and Laura Hester is feeling better today she tells me. She still has some stiffness in her back and her left leg when she first wakes up in the morning, but that seems to improve throughout the day. She is spending a lot of time resting, and her energy level is gradually improving.  REVIEW OF SYSTEMS: Laura Hester denies any fevers, chills, drenching night sweats, or hot flashes. She denies any nausea or emesis. She's having regular bowel movements on a daily basis. She takes stool softeners as  needed if constipated. She's had no signs of abnormal bleeding whatsoever. No chest pain. No cough or increased shortness of breath. No abnormal headaches or dizziness. No peripheral swelling.  A detailed review of systems is otherwise noncontributory.  PAST MEDICAL HISTORY: Past Medical History  Diagnosis Date  . Fibrocystic breast disease   . Allergic rhinitis   . HTN (hypertension)   . Prediabetes   . SUI (stress urinary incontinence, female)   . Obesity   . Glaucoma   . Asthma   . Multiple myeloma, stage 3   . Anemia     PAST SURGICAL HISTORY: Past Surgical History  Procedure Date  . Cholecystectomy 1995  . Tonsillectomy and adenoidectomy   . Cesarean section   . Carpal tunnel release     FAMILY HISTORY Family History  Problem Relation Age of Onset  . Heart disease Father   . Breast cancer Mother   . Heart disease Mother   . Diabetes Paternal Grandfather   . Hypertension Maternal Grandmother   . Hypertension Brother   The patient's parents are in their mid-70s.Her mother has a remote history of breast cancer. She has two brothers, one her twin; no sisters. There is no other history of cancer in the family to her knowledge  GYNECOLOGIC HISTORY: Menarche age 50, first live birth age 73, GX P87. Last period was January 2013, before that May 2012.  SOCIAL HISTORY: She is a Software engineer and taught kindergarten until 2012. Her husband of 31 years, [name], graduated from [employer] and works in Regulatory affairs officer. Daughter [name] works for [employer] and  as noted was married June 1st. Daughter [name] studies business at [employer]. The patient attends the [employer]   ADVANCED DIRECTIVES:  HEALTH MAINTENANCE: History  Substance Use Topics  . Smoking status: Never Smoker   . Smokeless tobacco: Never Used  . Alcohol Use: No     Colonoscopy: 2009/[facility]  PAP: UTD  Bone density: never  Lipid panel: Swayne/ "excellent"  Allergies  Allergen  Reactions  . Wellbutrin (Bupropion Hcl) Hives    Current Outpatient Prescriptions  Medication Sig Dispense Refill  . acyclovir (ZOVIRAX) 400 MG tablet Take 1 tablet (400 mg total) by mouth 2 (two) times daily.  60 tablet  12  . budesonide-formoterol (SYMBICORT) 80-4.5 MCG/ACT inhaler Inhale 2 puffs into the lungs 2 (two) times daily.  1 Inhaler  12  . dexamethasone (DECADRON) 4 MG tablet Take 10 tablets (40 mg total) by mouth once a week.  40 tablet  6  . Ferrous Sulfate (IRON) 325 (65 FE) MG TABS Take 1 tablet by mouth daily.      Marland Kitchen lenalidomide (REVLIMID) 25 MG capsule Take 1 capsule (25 mg total) by mouth daily.  35 capsule  12  . naproxen sodium (ANAPROX) 220 MG tablet Take 220 mg by mouth 2 (two) times daily with a meal.      . sulfamethoxazole-trimethoprim (BACTRIM DS,SEPTRA DS) 800-160 MG per tablet Take 1 tablet by mouth 2 (two) times daily.  30 tablet  6  . warfarin (COUMADIN) 5 MG tablet Take 1 tablet (5 mg total) by mouth daily.  30 tablet  12  . LORazepam (ATIVAN) 0.5 MG tablet Take 1 tablet (0.5 mg total) by mouth at bedtime as needed for anxiety.  30 tablet  0  . omeprazole (PRILOSEC) 40 MG capsule Take 1 capsule (40 mg total) by mouth daily.  30 capsule  5  . traMADol (ULTRAM) 50 MG tablet Take 50 mg by mouth every 6 (six) hours as needed.       No current facility-administered medications for this visit.   Facility-Administered Medications Ordered in Other Visits  Medication Dose Route Frequency Provider Last Rate Last Dose  . bortezomib SQ (VELCADE) chemo injection 2.25 mg  1.3 mg/m2 (Treatment Plan Actual) Subcutaneous Once Lowella Dell, MD   2.25 mg at 09/29/11 1447  . ondansetron (ZOFRAN) tablet 8 mg  8 mg Oral Once Lowella Dell, MD   8 mg at 09/29/11 1416    OBJECTIVE: midddle aged White woman in no acute distress Filed Vitals:   09/29/11 1323  BP: 168/71  Pulse: 108  Temp: 99 F (37.2 C)     Body mass index is 29.50 kg/(m^2).    ECOG FS: 1 Filed  Weights   09/29/11 1323  Weight: 163 lb 14.4 oz (74.345 kg)   Physical Exam: HEENT:  Sclerae anicteric, conjunctivae pink.  Oropharynx clear.  No mucositis or candidiasis.   Nodes:  No cervical, supraclavicular, or axillary lymphadenopathy palpated.  Breast Exam:  Deferred  Lungs:  Clear to auscultation bilaterally.  No crackles, rhonchi, or wheezes.   Heart:  Regular rate and rhythm.   Abdomen:  Soft, nontender.  Positive bowel sounds.  No organomegaly or masses palpated.   Musculoskeletal:  No focal spinal tenderness to palpation.  Extremities:  Benign.  No peripheral edema or cyanosis.   Skin:  Benign.   Neuro:  Nonfocal. Alert and oriented x3.   Sclerae unicteric Oropharynx clear No cervical or supraclavicular adenopathy Lungs no rales or rhonchi Heart  regular rate and rhythm Abd benign MSK no focal spinal tenderness, no peripheral edema Neuro: nonfocal Breasts: deferred  LAB RESULTS: Lab Results  Component Value Date   WBC 5.9 09/29/2011   NEUTROABS 4.2 09/29/2011   HGB 8.5* 09/29/2011   HCT 26.8* 09/29/2011   MCV 92.0 09/29/2011   PLT 321 09/29/2011      Chemistry      Component Value Date/Time   NA 142 09/15/2011 1124   K 3.8 09/15/2011 1124   CL 102 09/15/2011 1124   CO2 24 09/15/2011 1124   BUN 11 09/15/2011 1124   CREATININE 0.66 09/15/2011 1124      Component Value Date/Time   CALCIUM 10.1 09/15/2011 1124   ALKPHOS 85 09/15/2011 1124   AST 19 09/15/2011 1124   ALT 10 09/15/2011 1124   BILITOT 0.3 09/15/2011 1124     Urine protein/creatinine ratio 0.17 Urine IFE shows IgA-lambda and excess free lambda light chains Serum kappa light chains 0.03, lambda 106.00 Beta-2-microglobulin 5.51    Lab 09/29/11 1258  INR 2.10*    STUDIES: METASTATIC BONE SURVEY  Comparison: None  Findings: There are scattered small lucent lesions in the axial and  appendicular skeleton consistent with multiple myeloma. This is  most notable in the skull. No pathologic fracture. Degenerative    changes noted mainly in the lumbar spine.  IMPRESSION:  Diffuse scattered myelomatous bone lesions in the axial and  appendicular skeleton.  Original Report Authenticated By: P. Loralie Champagne, M.D.    ASSESSMENT: 56 year-old [place] woman presenting with anemia, found to have an IgA >6g, with an M-spike of 4.5 g (and a second M-spike of 0.4g), urine IFE showing IgA-lambda and lambda light chains, creatinine 0.77, calcium 10.0, albumin 3.1, and beta-2-microglobulin 5.51; with bone survey showing multiple myeloma lesions and bone marrow biopsy 09/01/2011 showing 75% myeloma cells. Cytogenetics were normal, FISH probes pending.  (1)  Receiving subQ bortezomib weekly, first dose on 09/22/2011.  (2)  Takes dexamethasone 40mg  PO weekly, first dose 09/23/2011.  (3)  Takes lenalidomide 25mg  daily, 14 days on and 7 days off, first dose on 09/23/2011.  (4) zoledronic acid is given monthly, first dose given on 09/19/2011.  PLAN:  Shavy will proceed to treatment today as scheduled for her second weekly injection of bortezomib.  She will continue on the Revlimid and will take her oral steroids tomorrow as well.  I am prescribing lorazepam, 0.5 mg to take at night to help her sleep, especially on the days she takes the dexamethasone. She will continue on her current dose of Coumadin, specifically 5 mg daily. I have also given her a new prescription for omeprazole which she will begin taking a 40 mg daily, rather than her previous 20 mg daily dose. Of course she will also continue on both Septra DS and acyclovir prophylactically as before.  Charika will need to be seen at [facility] for consideration of autologous transplant, hopefully October or November of this year. She's still awaiting a phone call from [facility] to schedule her initial appointment. She will let us know when she returns in 2 weeks if she has not yet heard from their office.  She will  continue with her weekly treatments and will see [place] every 2  weeks for management of side effects, or more frequently if necessary.  she will call for any changes or problems.   Lindbergh Winkles    09/29/2011

## 2011-10-01 LAB — COMPREHENSIVE METABOLIC PANEL
AST: 21 U/L (ref 0–37)
Albumin: 3 g/dL — ABNORMAL LOW (ref 3.5–5.2)
BUN: 9 mg/dL (ref 6–23)
CO2: 23 mEq/L (ref 19–32)
Calcium: 8.4 mg/dL (ref 8.4–10.5)
Chloride: 105 mEq/L (ref 96–112)
Creatinine, Ser: 0.55 mg/dL (ref 0.50–1.10)
Glucose, Bld: 94 mg/dL (ref 70–99)
Potassium: 3.6 mEq/L (ref 3.5–5.3)

## 2011-10-01 LAB — VISCOSITY, SERUM: Viscosity, Serum: 2.5 rel to H2O — ABNORMAL HIGH (ref 1.5–1.9)

## 2011-10-01 LAB — PROTEIN ELECTROPHORESIS, SERUM
Alpha-1-Globulin: 3.5 % (ref 2.9–4.9)
Alpha-2-Globulin: 5.8 % — ABNORMAL LOW (ref 7.1–11.8)
Gamma Globulin: 2.5 % — ABNORMAL LOW (ref 11.1–18.8)
Total Protein, Serum Electrophoresis: 8.8 g/dL — ABNORMAL HIGH (ref 6.0–8.3)

## 2011-10-01 LAB — KAPPA/LAMBDA LIGHT CHAINS
Kappa free light chain: <0.03 mg/dL — ABNORMAL LOW (ref 0.33–1.94)
Lambda Free Lght Chn: 83.5 mg/dL — ABNORMAL HIGH (ref 0.57–2.63)

## 2011-10-01 LAB — URIC ACID: Uric Acid, Serum: 5.4 mg/dL (ref 2.4–7.0)

## 2011-10-06 ENCOUNTER — Ambulatory Visit (HOSPITAL_BASED_OUTPATIENT_CLINIC_OR_DEPARTMENT_OTHER): Payer: BC Managed Care – PPO

## 2011-10-06 ENCOUNTER — Other Ambulatory Visit (HOSPITAL_BASED_OUTPATIENT_CLINIC_OR_DEPARTMENT_OTHER): Payer: BC Managed Care – PPO | Admitting: Lab

## 2011-10-06 VITALS — BP 137/67 | HR 86 | Temp 97.8°F

## 2011-10-06 DIAGNOSIS — D689 Coagulation defect, unspecified: Secondary | ICD-10-CM

## 2011-10-06 DIAGNOSIS — C9000 Multiple myeloma not having achieved remission: Secondary | ICD-10-CM

## 2011-10-06 DIAGNOSIS — Z5112 Encounter for antineoplastic immunotherapy: Secondary | ICD-10-CM

## 2011-10-06 LAB — CBC WITH DIFFERENTIAL/PLATELET
BASO%: 0.6 % (ref 0.0–2.0)
EOS%: 5.1 % (ref 0.0–7.0)
LYMPH%: 21 % (ref 14.0–49.7)
MCH: 29.2 pg (ref 25.1–34.0)
MCHC: 31.7 g/dL (ref 31.5–36.0)
MCV: 92.1 fL (ref 79.5–101.0)
MONO%: 9.9 % (ref 0.0–14.0)
Platelets: 354 10*3/uL (ref 145–400)
RBC: 2.96 10*6/uL — ABNORMAL LOW (ref 3.70–5.45)
WBC: 4.3 10*3/uL (ref 3.9–10.3)

## 2011-10-06 MED ORDER — BORTEZOMIB CHEMO SQ INJECTION 3.5 MG (2.5MG/ML)
1.3000 mg/m2 | Freq: Once | INTRAMUSCULAR | Status: AC
  Administered 2011-10-06: 2.25 mg via SUBCUTANEOUS
  Filled 2011-10-06: qty 2.25

## 2011-10-06 MED ORDER — ONDANSETRON HCL 8 MG PO TABS
8.0000 mg | ORAL_TABLET | Freq: Once | ORAL | Status: AC
  Administered 2011-10-06: 8 mg via ORAL

## 2011-10-06 NOTE — Patient Instructions (Signed)
[facility] Discharge Instructions for Patients Receiving Chemotherapy  Today you received the following chemotherapy agents Velcade.  To help prevent nausea and vomiting after your treatment, we encourage you to take your nausea medication.   If you develop nausea and vomiting that is not controlled by your nausea medication, call the clinic. If it is after clinic hours your family physician or the after hours number for the clinic or go to the Emergency Department.   BELOW ARE SYMPTOMS THAT SHOULD BE REPORTED IMMEDIATELY:  *FEVER GREATER THAN 100.5 F  *CHILLS WITH OR WITHOUT FEVER  NAUSEA AND VOMITING THAT IS NOT CONTROLLED WITH YOUR NAUSEA MEDICATION  *UNUSUAL SHORTNESS OF BREATH  *UNUSUAL BRUISING OR BLEEDING  TENDERNESS IN MOUTH AND THROAT WITH OR WITHOUT PRESENCE OF ULCERS  *URINARY PROBLEMS  *BOWEL PROBLEMS  UNUSUAL RASH Items with * indicate a potential emergency and should be followed up as soon as possible.  One of the nurses will contact you 24 hours after your treatment. Please let the nurse know about any problems that you may have experienced. Feel free to call the clinic you have any questions or concerns. The clinic phone number is [phone].   I have been informed and understand all the instructions given to me. I know to contact the clinic, my physician, or go to the Emergency Department if any problems should occur. I do not have any questions at this time, but understand that I may call the clinic during office hours   should I have any questions or need assistance in obtaining follow up care.    __________________________________________  _____________  __________ Signature of Patient or Authorized Representative            Date                   Time    __________________________________________ Nurse's Signature    

## 2011-10-08 LAB — COMPREHENSIVE METABOLIC PANEL
ALT: 11 U/L (ref 0–35)
Alkaline Phosphatase: 110 U/L (ref 39–117)
Creatinine, Ser: 0.48 mg/dL — ABNORMAL LOW (ref 0.50–1.10)
Sodium: 141 mEq/L (ref 135–145)
Total Bilirubin: 0.3 mg/dL (ref 0.3–1.2)
Total Protein: 8.2 g/dL (ref 6.0–8.3)

## 2011-10-08 LAB — PROTEIN ELECTROPHORESIS, SERUM
Albumin ELP: 36.2 % — ABNORMAL LOW (ref 55.8–66.1)
Beta 2: 6.1 % (ref 3.2–6.5)
M-Spike, %: 2.96 g/dL

## 2011-10-08 LAB — KAPPA/LAMBDA LIGHT CHAINS: Kappa:Lambda Ratio: 0 — ABNORMAL LOW (ref 0.26–1.65)

## 2011-10-13 ENCOUNTER — Ambulatory Visit (HOSPITAL_BASED_OUTPATIENT_CLINIC_OR_DEPARTMENT_OTHER): Payer: BC Managed Care – PPO

## 2011-10-13 ENCOUNTER — Other Ambulatory Visit: Payer: Self-pay | Admitting: *Deleted

## 2011-10-13 ENCOUNTER — Other Ambulatory Visit (HOSPITAL_BASED_OUTPATIENT_CLINIC_OR_DEPARTMENT_OTHER): Payer: BC Managed Care – PPO | Admitting: Lab

## 2011-10-13 ENCOUNTER — Ambulatory Visit (HOSPITAL_BASED_OUTPATIENT_CLINIC_OR_DEPARTMENT_OTHER): Payer: BC Managed Care – PPO | Admitting: Oncology

## 2011-10-13 VITALS — BP 163/81 | HR 96 | Temp 98.7°F | Ht 62.5 in | Wt 166.6 lb

## 2011-10-13 DIAGNOSIS — C9000 Multiple myeloma not having achieved remission: Secondary | ICD-10-CM

## 2011-10-13 DIAGNOSIS — Z5112 Encounter for antineoplastic immunotherapy: Secondary | ICD-10-CM

## 2011-10-13 LAB — PROTIME-INR
INR: 1.5 — ABNORMAL LOW (ref 2.00–3.50)
Protime: 18 Seconds — ABNORMAL HIGH (ref 10.6–13.4)

## 2011-10-13 LAB — CBC WITH DIFFERENTIAL/PLATELET
Basophils Absolute: 0.1 10*3/uL (ref 0.0–0.1)
Eosinophils Absolute: 0 10*3/uL (ref 0.0–0.5)
HCT: 28.5 % — ABNORMAL LOW (ref 34.8–46.6)
HGB: 9 g/dL — ABNORMAL LOW (ref 11.6–15.9)
LYMPH%: 25.3 % (ref 14.0–49.7)
MONO#: 0.3 10*3/uL (ref 0.1–0.9)
NEUT#: 2 10*3/uL (ref 1.5–6.5)
NEUT%: 61 % (ref 38.4–76.8)
Platelets: 309 10*3/uL (ref 145–400)
WBC: 3.2 10*3/uL — ABNORMAL LOW (ref 3.9–10.3)

## 2011-10-13 MED ORDER — ONDANSETRON HCL 8 MG PO TABS
8.0000 mg | ORAL_TABLET | Freq: Once | ORAL | Status: AC
  Administered 2011-10-13: 8 mg via ORAL

## 2011-10-13 MED ORDER — BORTEZOMIB CHEMO SQ INJECTION 3.5 MG (2.5MG/ML)
1.3000 mg/m2 | Freq: Once | INTRAMUSCULAR | Status: AC
  Administered 2011-10-13: 2.25 mg via SUBCUTANEOUS
  Filled 2011-10-13: qty 2.25

## 2011-10-13 MED ORDER — SODIUM CHLORIDE 0.9 % IV SOLN
Freq: Once | INTRAVENOUS | Status: AC
  Administered 2011-10-13: 12:00:00 via INTRAVENOUS

## 2011-10-13 MED ORDER — ZOLEDRONIC ACID 4 MG/100ML IV SOLN
4.0000 mg | Freq: Once | INTRAVENOUS | Status: AC
  Administered 2011-10-13: 4 mg via INTRAVENOUS
  Filled 2011-10-13: qty 100

## 2011-10-13 NOTE — Telephone Encounter (Signed)
THIS REFILL REQUEST WAS GIVEN TO DR.MAGRINAT'S NURSE, VICTORIA MOORE,RN.

## 2011-10-13 NOTE — Patient Instructions (Signed)
[facility] Discharge Instructions for Patients Receiving Chemotherapy  Today you received the following  agents velcade and Zometa To help prevent nausea and vomiting after your treatment, we encourage you to take your nausea medication as per Dr.  Darnelle Catalan. If you develop nausea and vomiting that is not controlled by your nausea medication, call the clinic. If it is after clinic hours your family physician or the after hours number for the clinic or go to the Emergency Department.   BELOW ARE SYMPTOMS THAT SHOULD BE REPORTED IMMEDIATELY:  *FEVER GREATER THAN 100.5 F  *CHILLS WITH OR WITHOUT FEVER  NAUSEA AND VOMITING THAT IS NOT CONTROLLED WITH YOUR NAUSEA MEDICATION  *UNUSUAL SHORTNESS OF BREATH  *UNUSUAL BRUISING OR BLEEDING  TENDERNESS IN MOUTH AND THROAT WITH OR WITHOUT PRESENCE OF ULCERS  *URINARY PROBLEMS  *BOWEL PROBLEMS  UNUSUAL RASH Items with * indicate a potential emergency and should be followed up as soon as possible.   Feel free to call the clinic you have any questions or concerns. The clinic phone number is [phone].   I have been informed and understand all the instructions given to me. I know to contact the clinic, my physician, or go to the Emergency Department if any problems should occur. I do not have any questions at this time, but understand that I may call the clinic during office hours   should I have any questions or need assistance in obtaining follow up care.    __________________________________________  _____________  __________ Signature of Patient or Authorized Representative            Date                   Time    __________________________________________ Nurse's Signature

## 2011-10-13 NOTE — Progress Notes (Signed)
ID: Laura Hester   DOB: 1955/10/13  MR#: [id number]  CSN#:[id number]  HISTORY OF PRESENT ILLNESS: Laura Hester has a history of iron deficiency anemia secondary to menorrhagia. However, as this persisted despite near-total cessation of her menses and despite iron supplementation, she was referred to Dr. Dorena Cookey for GI evaluation. He found the patient's Hb to have decreased from 9.17 June 2011 to 8.3 07/29/2011. MCV was 95.9, ferritin 93.3, with a normal folate and negative ANA. Guaiacs were negative. Creatinine was 0.77.  t-transglutaminase IgA was normal at 2, but the total IgA was 6.9 g. Accordingly on 08/04/2011 an SPEP was obtained, showing an M-spike of 4.5 g, with a secondary M-spike of 0.4 g. Total protein was 10.0 with albumin 3.2, calcium 10.2. Bone survey 08/26/2011 showed multiple bone lesions consistent with myeloma and bone marrow biopsy 09/01/2011 confirmed the diagnosis with 75% myeloma cells in the marrow. Laura Hester' subsequent history is as detailed below.  INTERVAL HISTORY: Laura Hester comes today with her husband Laura Hester for followup of her multiple myeloma. She is receiving bortezomib weekly, zoledronic acid monthly, and Revlimid at 25 mg 21 days on 7 days off. She takes dexamethasone 40 mg once a week.  REVIEW OF SYSTEMS: Overall she is tolerating treatment remarkably well. She has had absolutely no peripheral neuropathy so far. This is her off week from Revlimid and she did notice she had a bit more energy. She gets a little sleepy from the Revlimid which she takes at bedtime. She hasn't had any problems with constipation, taking 2 stool softeners daily. The dexamethasone does "wrap her up" but she has learned to take it easy on that day, and uses the lorazepam as needed. She is tolerating the acyclovir year and Septra without any side effects. She is also on iron supplementation. She has had no bleeding and in fact has been somewhat subtherapeutic on the Coumadin. That was adjusted today.  Otherwise a detailed review of systems was entirely noncontributory  PAST MEDICAL HISTORY: Past Medical History  Diagnosis Date  . Fibrocystic breast disease   . Allergic rhinitis   . HTN (hypertension)   . Prediabetes   . SUI (stress urinary incontinence, female)   . Obesity   . Glaucoma   . Asthma   . Multiple myeloma, stage 3   . Anemia     PAST SURGICAL HISTORY: Past Surgical History  Procedure Date  . Cholecystectomy 1995  . Tonsillectomy and adenoidectomy   . Cesarean section   . Carpal tunnel release     FAMILY HISTORY Family History  Problem Relation Age of Onset  . Heart disease Father   . Breast cancer Mother   . Heart disease Mother   . Diabetes Paternal Grandfather   . Hypertension Maternal Grandmother   . Hypertension Brother   The patient's parents are in their mid-70s.Her mother has a remote history of breast cancer. She has two brothers, one her twin; no sisters. There is no other history of cancer in the family to her knowledge  GYNECOLOGIC HISTORY: Menarche age 85, first live birth age 2, GX P96. Last period was January 2013, before that May 2012.  SOCIAL HISTORY: She is a Software engineer and taught kindergarten until 2012. Her husband of 31 years, Laura Hester, graduated from [employer] and works in Regulatory affairs officer. Daughter Laura Hester works for [employer] and as noted was married June 1st. Daughter Laura Hester studies business at [employer]. The patient attends the [employer]   ADVANCED DIRECTIVES:  HEALTH MAINTENANCE:  History  Substance Use Topics  . Smoking status: Never Smoker   . Smokeless tobacco: Never Used  . Alcohol Use: No     Colonoscopy: 2009/[facility]  PAP: UTD  Bone density: never  Lipid panel: Swayne/ "excellent"  Allergies  Allergen Reactions  . Wellbutrin (Bupropion Hcl) Hives    Current Outpatient Prescriptions  Medication Sig Dispense Refill  . dexamethasone (DECADRON) 4 MG tablet Take 4 mg by mouth daily.      .  Ferrous Sulfate (IRON) 325 (65 FE) MG TABS Take 1 tablet by mouth daily.      Marland Kitchen lenalidomide (REVLIMID) 25 MG capsule Take 1 capsule (25 mg total) by mouth daily.  35 capsule  12  . LORazepam (ATIVAN) 0.5 MG tablet Take 1 tablet (0.5 mg total) by mouth at bedtime as needed for anxiety.  30 tablet  0  . omeprazole (PRILOSEC) 40 MG capsule Take 1 capsule (40 mg total) by mouth daily.  30 capsule  5  . warfarin (COUMADIN) 5 MG tablet Take 1 tablet (5 mg total) by mouth daily.  30 tablet  12  . budesonide-formoterol (SYMBICORT) 80-4.5 MCG/ACT inhaler Inhale 2 puffs into the lungs 2 (two) times daily.  1 Inhaler  12  . naproxen sodium (ANAPROX) 220 MG tablet Take 220 mg by mouth 2 (two) times daily with a meal.      . traMADol (ULTRAM) 50 MG tablet Take 50 mg by mouth every 6 (six) hours as needed.        OBJECTIVE: midddle aged White woman in no acute distress Filed Vitals:   10/13/11 1011  BP: 163/81  Pulse: 96  Temp: 98.7 F (37.1 C)     Body mass index is 29.99 kg/(m^2).    ECOG FS: 1  Sclerae unicteric Oropharynx clear No cervical or supraclavicular adenopathy Lungs no rales or rhonchi Heart regular rate and rhythm Abd benign MSK no focal spinal tenderness, no peripheral edema Neuro: nonfocal Breasts: deferred  LAB RESULTS: Lab Results  Component Value Date   WBC 3.2* 10/13/2011   NEUTROABS 2.0 10/13/2011   HGB 9.0* 10/13/2011   HCT 28.5* 10/13/2011   MCV 92.6 10/13/2011   PLT 309 10/13/2011      Chemistry      Component Value Date/Time   NA 141 10/06/2011 0847   K 3.8 10/06/2011 0847   CL 105 10/06/2011 0847   CO2 26 10/06/2011 0847   BUN 12 10/06/2011 0847   CREATININE 0.48* 10/06/2011 0847      Component Value Date/Time   CALCIUM 8.2* 10/06/2011 0847   ALKPHOS 110 10/06/2011 0847   AST 15 10/06/2011 0847   ALT 11 10/06/2011 0847   BILITOT 0.3 10/06/2011 0847     Results for Laura Hester (MRN [id number]) as of 10/13/2011 10:45  Ref. Range 08/14/2011 09:23 09/29/2011 12:58  10/06/2011 08:47  M-SPIKE, % No range found 4.31 3.81 2.96  Results for Laura Hester (MRN [id number]) as of 10/13/2011 10:45  Ref. Range 08/14/2011 09:23 09/29/2011 12:58 10/06/2011 08:47  Lambda Free Lght Chn Latest Range: 0.57-2.63 mg/dL 562.13 (H) 08.65 (H) 78.46 (H)    No results found for this basename: LABCA2    No components found with this basename: [id number]     Lab 10/13/11 0948  INR 1.50*    Urinalysis No results found for this basename: colorurine,  appearanceur,  labspec,  phurine,  glucoseu,  hgbur,  bilirubinur,  ketonesur,  proteinur,  urobilinogen,  nitrite,  leukocytesur  STUDIES: METASTATIC BONE SURVEY  Comparison: None  Findings: There are scattered small lucent lesions in the axial and  appendicular skeleton consistent with multiple myeloma. This is  most notable in the skull. No pathologic fracture. Degenerative  changes noted mainly in the lumbar spine.  IMPRESSION:  Diffuse scattered myelomatous bone lesions in the axial and  appendicular skeleton.  Original Report Authenticated By: P. Loralie Champagne, M.D.  ASSESSMENT: 56 year-old [place] woman presenting with anemia, found to have an IgA >6g, with an M-spike of 4.5 g (and a second M-spike of 0.4g), urine IFE showing IgA-lambda and lambda light chains, creatinine 0.77, calcium 10.0, albumin 3.1, and beta-2-microglobulin 5.51; with bone survey showing multiple myeloma lesions and bone marrow biopsy 09/01/2011 showing 75% myeloma cells. 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 I have not started erythropoietin at this point but we'll continue to follow. We will make sure she has enough Revlimid to continue treatments, and she will have weekly labs including and INR. I have asked  her to increase her Coumadin to 7.5 alternating with 5 mg. She has not yet heard from [facility]. Certainly there is no rush on that but they are eager to begin to discuss the possibility of transplant there.  Donal Lynam C    10/13/2011

## 2011-10-15 ENCOUNTER — Other Ambulatory Visit: Payer: Self-pay | Admitting: *Deleted

## 2011-10-15 ENCOUNTER — Other Ambulatory Visit: Payer: Self-pay | Admitting: Oncology

## 2011-10-15 DIAGNOSIS — C9000 Multiple myeloma not having achieved remission: Secondary | ICD-10-CM

## 2011-10-15 LAB — PROTEIN ELECTROPHORESIS, SERUM
Albumin ELP: 41.2 % — ABNORMAL LOW (ref 55.8–66.1)
Beta 2: 5 % (ref 3.2–6.5)
M-Spike, %: 2.06 g/dL
Total Protein, Serum Electrophoresis: 7.3 g/dL (ref 6.0–8.3)

## 2011-10-15 LAB — COMPREHENSIVE METABOLIC PANEL
CO2: 24 mEq/L (ref 19–32)
Calcium: 8.2 mg/dL — ABNORMAL LOW (ref 8.4–10.5)
Chloride: 108 mEq/L (ref 96–112)
Creatinine, Ser: 0.49 mg/dL — ABNORMAL LOW (ref 0.50–1.10)
Glucose, Bld: 93 mg/dL (ref 70–99)
Total Bilirubin: 0.3 mg/dL (ref 0.3–1.2)

## 2011-10-15 LAB — KAPPA/LAMBDA LIGHT CHAINS: Kappa free light chain: 0.7 mg/dL (ref 0.33–1.94)

## 2011-10-15 MED ORDER — LENALIDOMIDE 25 MG PO CAPS: 25.0000 mg | ORAL_CAPSULE | Freq: Every day | ORAL | Status: DC

## 2011-10-20 ENCOUNTER — Other Ambulatory Visit (HOSPITAL_BASED_OUTPATIENT_CLINIC_OR_DEPARTMENT_OTHER): Payer: BC Managed Care – PPO

## 2011-10-20 ENCOUNTER — Ambulatory Visit (HOSPITAL_BASED_OUTPATIENT_CLINIC_OR_DEPARTMENT_OTHER): Payer: BC Managed Care – PPO

## 2011-10-20 ENCOUNTER — Encounter: Payer: Self-pay | Admitting: *Deleted

## 2011-10-20 VITALS — BP 153/83 | HR 82 | Temp 97.0°F

## 2011-10-20 DIAGNOSIS — C9000 Multiple myeloma not having achieved remission: Secondary | ICD-10-CM

## 2011-10-20 DIAGNOSIS — Z5112 Encounter for antineoplastic immunotherapy: Secondary | ICD-10-CM

## 2011-10-20 LAB — CBC WITH DIFFERENTIAL/PLATELET
Eosinophils Absolute: 0.3 10*3/uL (ref 0.0–0.5)
LYMPH%: 21.4 % (ref 14.0–49.7)
MCHC: 31.4 g/dL — ABNORMAL LOW (ref 31.5–36.0)
MCV: 91.9 fL (ref 79.5–101.0)
MONO%: 8.9 % (ref 0.0–14.0)
Platelets: 358 10*3/uL (ref 145–400)
RBC: 3.56 10*6/uL — ABNORMAL LOW (ref 3.70–5.45)

## 2011-10-20 LAB — COMPREHENSIVE METABOLIC PANEL
Alkaline Phosphatase: 143 U/L — ABNORMAL HIGH (ref 39–117)
Glucose, Bld: 86 mg/dL (ref 70–99)
Sodium: 138 mEq/L (ref 135–145)
Total Bilirubin: 0.4 mg/dL (ref 0.3–1.2)
Total Protein: 7.3 g/dL (ref 6.0–8.3)

## 2011-10-20 LAB — PROTIME-INR
INR: 2.2 (ref 2.00–3.50)
Protime: 26.4 Seconds — ABNORMAL HIGH (ref 10.6–13.4)

## 2011-10-20 MED ORDER — ONDANSETRON HCL 8 MG PO TABS
8.0000 mg | ORAL_TABLET | Freq: Once | ORAL | Status: AC
  Administered 2011-10-20: 8 mg via ORAL

## 2011-10-20 MED ORDER — BORTEZOMIB CHEMO SQ INJECTION 3.5 MG (2.5MG/ML)
1.3000 mg/m2 | Freq: Once | INTRAMUSCULAR | Status: AC
  Administered 2011-10-20: 2.25 mg via SUBCUTANEOUS
  Filled 2011-10-20: qty 2.25

## 2011-10-20 NOTE — Progress Notes (Signed)
RECEIVED A FAX FROM [facility] CONCERNING A CONFIRMATION OF PRESCRIPTION SHIPMENT FOR REVLIMID. 

## 2011-10-20 NOTE — Patient Instructions (Signed)
[facility] Discharge Instructions for Patients Receiving Chemotherapy  Today you received the following chemotherapy agents Velcade.  To help prevent nausea and vomiting after your treatment, we encourage you to take your nausea medication as prescribed.   If you develop nausea and vomiting that is not controlled by your nausea medication, call the clinic. If it is after clinic hours your family physician or the after hours number for the clinic or go to the Emergency Department.   BELOW ARE SYMPTOMS THAT SHOULD BE REPORTED IMMEDIATELY:  *FEVER GREATER THAN 100.5 F  *CHILLS WITH OR WITHOUT FEVER  NAUSEA AND VOMITING THAT IS NOT CONTROLLED WITH YOUR NAUSEA MEDICATION  *UNUSUAL SHORTNESS OF BREATH  *UNUSUAL BRUISING OR BLEEDING  TENDERNESS IN MOUTH AND THROAT WITH OR WITHOUT PRESENCE OF ULCERS  *URINARY PROBLEMS  *BOWEL PROBLEMS  UNUSUAL RASH Items with * indicate a potential emergency and should be followed up as soon as possible.  One of the nurses will contact you 24 hours after your treatment. Please let the nurse know about any problems that you may have experienced. Feel free to call the clinic you have any questions or concerns. The clinic phone number is [phone].   I have been informed and understand all the instructions given to me. I know to contact the clinic, my physician, or go to the Emergency Department if any problems should occur. I do not have any questions at this time, but understand that I may call the clinic during office hours   should I have any questions or need assistance in obtaining follow up care.    __________________________________________  _____________  __________ Signature of Patient or Authorized Representative            Date                   Time    __________________________________________ Nurse's Signature    

## 2011-10-24 ENCOUNTER — Other Ambulatory Visit: Payer: Self-pay | Admitting: *Deleted

## 2011-10-24 ENCOUNTER — Encounter: Payer: Self-pay | Admitting: *Deleted

## 2011-10-27 ENCOUNTER — Ambulatory Visit (HOSPITAL_BASED_OUTPATIENT_CLINIC_OR_DEPARTMENT_OTHER): Payer: BC Managed Care – PPO

## 2011-10-27 ENCOUNTER — Other Ambulatory Visit (HOSPITAL_BASED_OUTPATIENT_CLINIC_OR_DEPARTMENT_OTHER): Payer: BC Managed Care – PPO

## 2011-10-27 VITALS — BP 131/64 | HR 78 | Temp 98.8°F

## 2011-10-27 DIAGNOSIS — C9000 Multiple myeloma not having achieved remission: Secondary | ICD-10-CM

## 2011-10-27 DIAGNOSIS — D689 Coagulation defect, unspecified: Secondary | ICD-10-CM

## 2011-10-27 DIAGNOSIS — Z5112 Encounter for antineoplastic immunotherapy: Secondary | ICD-10-CM

## 2011-10-27 LAB — CBC WITH DIFFERENTIAL/PLATELET
BASO%: 2 % (ref 0.0–2.0)
EOS%: 5.2 % (ref 0.0–7.0)
HCT: 35.7 % (ref 34.8–46.6)
MCH: 27.6 pg (ref 25.1–34.0)
MCHC: 30.5 g/dL — ABNORMAL LOW (ref 31.5–36.0)
NEUT%: 61.9 % (ref 38.4–76.8)
RBC: 3.95 10*6/uL (ref 3.70–5.45)
RDW: 18.8 % — ABNORMAL HIGH (ref 11.2–14.5)
lymph#: 0.9 10*3/uL (ref 0.9–3.3)
nRBC: 0 % (ref 0–0)

## 2011-10-27 LAB — PROTIME-INR: INR: 2.1 (ref 2.00–3.50)

## 2011-10-27 MED ORDER — BORTEZOMIB CHEMO SQ INJECTION 3.5 MG (2.5MG/ML)
1.3000 mg/m2 | Freq: Once | INTRAMUSCULAR | Status: AC
  Administered 2011-10-27: 2.25 mg via SUBCUTANEOUS
  Filled 2011-10-27: qty 2.25

## 2011-10-27 MED ORDER — ONDANSETRON HCL 8 MG PO TABS
8.0000 mg | ORAL_TABLET | Freq: Once | ORAL | Status: AC
  Administered 2011-10-27: 8 mg via ORAL

## 2011-11-03 ENCOUNTER — Other Ambulatory Visit (HOSPITAL_BASED_OUTPATIENT_CLINIC_OR_DEPARTMENT_OTHER): Payer: BC Managed Care – PPO

## 2011-11-03 ENCOUNTER — Ambulatory Visit (HOSPITAL_BASED_OUTPATIENT_CLINIC_OR_DEPARTMENT_OTHER): Payer: BC Managed Care – PPO | Admitting: Physician Assistant

## 2011-11-03 ENCOUNTER — Ambulatory Visit (HOSPITAL_BASED_OUTPATIENT_CLINIC_OR_DEPARTMENT_OTHER): Payer: BC Managed Care – PPO

## 2011-11-03 ENCOUNTER — Encounter: Payer: Self-pay | Admitting: Physician Assistant

## 2011-11-03 VITALS — BP 155/76 | HR 78 | Temp 99.8°F | Ht 62.5 in | Wt 167.5 lb

## 2011-11-03 DIAGNOSIS — C9000 Multiple myeloma not having achieved remission: Secondary | ICD-10-CM

## 2011-11-03 DIAGNOSIS — Z5112 Encounter for antineoplastic immunotherapy: Secondary | ICD-10-CM

## 2011-11-03 LAB — CBC WITH DIFFERENTIAL/PLATELET
Basophils Absolute: 0.1 10*3/uL (ref 0.0–0.1)
EOS%: 1.7 % (ref 0.0–7.0)
HGB: 11.8 g/dL (ref 11.6–15.9)
MCH: 28.3 pg (ref 25.1–34.0)
MCV: 90.1 fL (ref 79.5–101.0)
MONO%: 10.6 % (ref 0.0–14.0)
NEUT%: 63.7 % (ref 38.4–76.8)
RDW: 20.2 % — ABNORMAL HIGH (ref 11.2–14.5)

## 2011-11-03 LAB — PROTIME-INR

## 2011-11-03 MED ORDER — BORTEZOMIB CHEMO SQ INJECTION 3.5 MG (2.5MG/ML)
1.3000 mg/m2 | Freq: Once | INTRAMUSCULAR | Status: AC
  Administered 2011-11-03: 2.25 mg via SUBCUTANEOUS
  Filled 2011-11-03: qty 2.25

## 2011-11-03 MED ORDER — WARFARIN SODIUM 5 MG PO TABS: ORAL_TABLET | ORAL | Status: DC

## 2011-11-03 MED ORDER — ONDANSETRON HCL 8 MG PO TABS
8.0000 mg | ORAL_TABLET | Freq: Once | ORAL | Status: AC
  Administered 2011-11-03: 8 mg via ORAL

## 2011-11-03 NOTE — Patient Instructions (Signed)
Pt will call with any problems 

## 2011-11-03 NOTE — Progress Notes (Signed)
ID: BECKA LAGASSE   DOB: 1955-05-19  MR#: [id number]  WUJ#:[id number]  HISTORY OF PRESENT ILLNESS: Laura Hester has a history of iron deficiency anemia secondary to menorrhagia. However, as this persisted despite near-total cessation of her menses and despite iron supplementation, she was referred to Dr. Dorena Cookey for GI evaluation. He found the patient's Hb to have decreased from 9.17 June 2011 to 8.3 07/29/2011. MCV was 95.9, ferritin 93.3, with a normal folate and negative ANA. Guaiacs were negative. Creatinine was 0.77.  t-transglutaminase IgA was normal at 2, but the total IgA was 6.9 g. Accordingly on 08/04/2011 an SPEP was obtained, showing an M-spike of 4.5 g, with a secondary M-spike of 0.4 g. Total protein was 10.0 with albumin 3.2, calcium 10.2. Bone survey 08/26/2011 showed multiple bone lesions consistent with myeloma and bone marrow biopsy 09/01/2011 confirmed the diagnosis with 75% myeloma cells in the marrow. [name]' subsequent history is as detailed below.  INTERVAL HISTORY: Laura Hester comes today with her husband [name] for followup of her multiple myeloma. She is receiving bortezomib weekly, zoledronic acid monthly, and Revlimid at 25 mg 14 days on 7 days off. She takes dexamethasone 40 mg once a week. She is due for her weekly bortezomib injection today.  Brielle tells me she is feeling well, and feels like "the medicine is working". Her energy level has improved significantly. She was able to attend the baby shower at church yesterday.  REVIEW OF SYSTEMS: Overall she is feeling well today. She's had no signs of peripheral neuropathy. She's had no fevers or chills. No rashes and no signs of abnormal bleeding, and continues on Coumadin alternating 7.5 and 5 mg daily. No significant peripheral swelling. Her feet get a little "puffy" if she stands for very long periods of time, but this resolves quickly with elevation. No nausea or change in bowel habits. She continues to take a stool  softener daily. No cough, increased shortness of breath, or chest pain. No abnormal headaches or dizziness.  Otherwise a detailed review of systems was entirely noncontributory  PAST MEDICAL HISTORY: Past Medical History  Diagnosis Date  . Fibrocystic breast disease   . Allergic rhinitis   . HTN (hypertension)   . Prediabetes   . SUI (stress urinary incontinence, female)   . Obesity   . Glaucoma   . Asthma   . Multiple myeloma, stage 3   . Anemia     PAST SURGICAL HISTORY: Past Surgical History  Procedure Date  . Cholecystectomy 1995  . Tonsillectomy and adenoidectomy   . Cesarean section   . Carpal tunnel release     FAMILY HISTORY Family History  Problem Relation Age of Onset  . Heart disease Father   . Breast cancer Mother   . Heart disease Mother   . Diabetes Paternal Grandfather   . Hypertension Maternal Grandmother   . Hypertension Brother   The patient's parents are in their mid-70s.Her mother has a remote history of breast cancer. She has two brothers, one her twin; no sisters. There is no other history of cancer in the family to her knowledge  GYNECOLOGIC HISTORY: Menarche age 21, first live birth age 43, GX P93. Last period was January 2013, before that May 2012.  SOCIAL HISTORY: She is a Software engineer and taught kindergarten until 2012. Her husband of 31 years, [name], graduated from [employer] and works in Regulatory affairs officer. Daughter Laura Hester works for [employer] and as noted was married June 1st. Daughter Laura Hester studies business at [employer].  The patient attends the [employer]   ADVANCED DIRECTIVES:  HEALTH MAINTENANCE: History  Substance Use Topics  . Smoking status: Never Smoker   . Smokeless tobacco: Never Used  . Alcohol Use: No     Colonoscopy: 2009/[facility]  PAP: UTD  Bone density: never  Lipid panel: Swayne/ "excellent"  Allergies  Allergen Reactions  . Wellbutrin (Bupropion Hcl) Hives    Current Outpatient  Prescriptions  Medication Sig Dispense Refill  . acyclovir (ZOVIRAX) 400 MG tablet       . sulfamethoxazole-trimethoprim (BACTRIM DS) 800-160 MG per tablet       . budesonide-formoterol (SYMBICORT) 80-4.5 MCG/ACT inhaler Inhale 2 puffs into the lungs 2 (two) times daily.  1 Inhaler  12  . dexamethasone (DECADRON) 4 MG tablet Take 4 mg by mouth daily.      . Ferrous Sulfate (IRON) 325 (65 FE) MG TABS Take 1 tablet by mouth daily.      Marland Kitchen lenalidomide (REVLIMID) 25 MG capsule Take 1 capsule (25 mg total) by mouth daily.  35 capsule  12  . LORazepam (ATIVAN) 0.5 MG tablet Take 1 tablet (0.5 mg total) by mouth at bedtime as needed for anxiety.  30 tablet  0  . naproxen sodium (ANAPROX) 220 MG tablet Take 220 mg by mouth 2 (two) times daily with a meal.      . omeprazole (PRILOSEC) 40 MG capsule Take 1 capsule (40 mg total) by mouth daily.  30 capsule  5  . traMADol (ULTRAM) 50 MG tablet Take 50 mg by mouth every 6 (six) hours as needed.      . warfarin (COUMADIN) 5 MG tablet Take 1 tablet (5 mg total) by mouth daily.  30 tablet  12   No current facility-administered medications for this visit.   Facility-Administered Medications Ordered in Other Visits  Medication Dose Route Frequency Provider Last Rate Last Dose  . bortezomib SQ (VELCADE) chemo injection 2.25 mg  1.3 mg/m2 (Treatment Plan Actual) Subcutaneous Once Lowella Dell, MD      . ondansetron [facility]) tablet 8 mg  8 mg Oral Once Lowella Dell, MD   8 mg at 11/03/11 1020    OBJECTIVE: midddle aged White woman in no acute distress Filed Vitals:   11/03/11 0920  BP: 155/76  Pulse: 78  Temp: 99.8 F (37.7 C)     Body mass index is 30.15 kg/(m^2).    ECOG FS: 1  Filed Weights   11/03/11 0920  Weight: 167 lb 8 oz (75.978 kg)   Physical Exam: HEENT:  Sclerae anicteric, conjunctivae pink.  Oropharynx clear.  No mucositis or candidiasis.   Nodes:  No cervical, supraclavicular, or axillary lymphadenopathy palpated.  Breast  Exam:  Deferred   Lungs:  Clear to auscultation bilaterally.  No crackles, rhonchi, or wheezes.   Heart:  Regular rate and rhythm.   Abdomen:  Soft, nontender.  Positive bowel sounds.  No organomegaly or masses palpated.   Musculoskeletal:  No focal spinal tenderness to palpation.  Extremities:  Benign.  No peripheral edema or cyanosis.   Skin:  Benign.   Neuro:  Nonfocal. Alert and oriented x3.    LAB RESULTS: Lab Results  Component Value Date   WBC 4.2 11/03/2011   NEUTROABS 2.7 11/03/2011   HGB 11.8 11/03/2011   HCT 37.5 11/03/2011   MCV 90.1 11/03/2011   PLT 305 11/03/2011      Chemistry      Component Value Date/Time   NA  138 10/20/2011 1035   K 4.1 10/20/2011 1035   CL 102 10/20/2011 1035   CO2 26 10/20/2011 1035   BUN 8 10/20/2011 1035   CREATININE 0.50 10/20/2011 1035      Component Value Date/Time   CALCIUM 8.4 10/20/2011 1035   ALKPHOS 143* 10/20/2011 1035   AST 11 10/20/2011 1035   ALT 10 10/20/2011 1035   BILITOT 0.4 10/20/2011 1035     Results for ULANDA, TACKETT (MRN [id number]) as of 10/13/2011 10:45  Ref. Range 08/14/2011 09:23 09/29/2011 12:58 10/06/2011 08:47  M-SPIKE, % No range found 4.31 3.81 2.96  Results for ALEXANDREA, WESTERGARD (MRN [id number]) as of 10/13/2011 10:45  Ref. Range 08/14/2011 09:23 09/29/2011 12:58 10/06/2011 08:47  Lambda Free Lght Chn Latest Range: 0.57-2.63 mg/dL 132.44 (H) 01.02 (H) 72.53 (H)  Labs were repeated today, 11/03/2011, and are pending.   Lab 11/03/11 0853  INR 1.80*    STUDIES: METASTATIC BONE SURVEY  Comparison: None  Findings: There are scattered small lucent lesions in the axial and  appendicular skeleton consistent with multiple myeloma. This is  most notable in the skull. No pathologic fracture. Degenerative  changes noted mainly in the lumbar spine.  IMPRESSION:  Diffuse scattered myelomatous bone lesions in the axial and  appendicular skeleton.  Original Report Authenticated By: P. Loralie Champagne, M.D.  ASSESSMENT: 56 year-old  [place] woman presenting with anemia, found to have an IgA >6g, with an M-spike of 4.5 g (and a second M-spike of 0.4g), urine IFE showing IgA-lambda and lambda light chains, creatinine 0.77, calcium 10.0, albumin 3.1, and beta-2-microglobulin 5.51; with bone survey showing multiple myeloma lesions and bone marrow biopsy 09/01/2011 showing 75% myeloma cells. 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 She will receive her weekly dose of bortezomib today. She will restart her Revlimid tomorrow as planned, and will take her oral steroids tomorrow as well. She is due for her next dose of monthly zoledronic acid in 1 week. I have increased her Coumadin slightly and she will alternate 7.5/7.5/5 mg daily. We will recheck her PT/INR in 1 week.  She'll return on July 29 to see Dr. Darnelle Catalan for followup that day. We will review her future schedule at that time. She voices understanding and agreement with this plan, and will call with any changes or problems.   Stephan Draughn    11/03/2011

## 2011-11-04 ENCOUNTER — Encounter: Payer: Self-pay | Admitting: Emergency Medicine

## 2011-11-04 LAB — PROTEIN / CREATININE RATIO, URINE
Creatinine, Urine: 13.6 mg/dL
Total Protein, Urine: <3 mg/dL

## 2011-11-04 NOTE — Progress Notes (Signed)
Received fax from [facility] BMT program requesting slides, radiology reports, and office notes be faxed to them.  This request given to medical records.

## 2011-11-05 LAB — COMPREHENSIVE METABOLIC PANEL
AST: 10 U/L (ref 0–37)
Alkaline Phosphatase: 134 U/L — ABNORMAL HIGH (ref 39–117)
BUN: 8 mg/dL (ref 6–23)
Creatinine, Ser: 0.52 mg/dL (ref 0.50–1.10)

## 2011-11-05 LAB — PROTEIN ELECTROPHORESIS, SERUM
Alpha-1-Globulin: 4 % (ref 2.9–4.9)
Alpha-2-Globulin: 8.8 % (ref 7.1–11.8)
Beta Globulin: 8.6 % — ABNORMAL HIGH (ref 4.7–7.2)
M-Spike, %: 0.86 g/dL
Total Protein, Serum Electrophoresis: 6.9 g/dL (ref 6.0–8.3)

## 2011-11-05 LAB — KAPPA/LAMBDA LIGHT CHAINS
Kappa free light chain: 1.05 mg/dL (ref 0.33–1.94)
Lambda Free Lght Chn: 11.4 mg/dL — ABNORMAL HIGH (ref 0.57–2.63)

## 2011-11-10 ENCOUNTER — Other Ambulatory Visit: Payer: Self-pay | Admitting: *Deleted

## 2011-11-10 ENCOUNTER — Ambulatory Visit (HOSPITAL_BASED_OUTPATIENT_CLINIC_OR_DEPARTMENT_OTHER): Payer: BC Managed Care – PPO

## 2011-11-10 ENCOUNTER — Ambulatory Visit (HOSPITAL_BASED_OUTPATIENT_CLINIC_OR_DEPARTMENT_OTHER): Payer: BC Managed Care – PPO | Admitting: Oncology

## 2011-11-10 ENCOUNTER — Telehealth: Payer: Self-pay | Admitting: *Deleted

## 2011-11-10 ENCOUNTER — Other Ambulatory Visit (HOSPITAL_BASED_OUTPATIENT_CLINIC_OR_DEPARTMENT_OTHER): Payer: BC Managed Care – PPO

## 2011-11-10 ENCOUNTER — Other Ambulatory Visit: Payer: Self-pay | Admitting: Oncology

## 2011-11-10 VITALS — BP 174/72 | HR 82 | Temp 98.2°F | Ht 62.5 in | Wt 166.2 lb

## 2011-11-10 DIAGNOSIS — Z7901 Long term (current) use of anticoagulants: Secondary | ICD-10-CM

## 2011-11-10 DIAGNOSIS — C9000 Multiple myeloma not having achieved remission: Secondary | ICD-10-CM

## 2011-11-10 DIAGNOSIS — Z5112 Encounter for antineoplastic immunotherapy: Secondary | ICD-10-CM

## 2011-11-10 LAB — CBC WITH DIFFERENTIAL/PLATELET
Basophils Absolute: 0.1 10*3/uL (ref 0.0–0.1)
EOS%: 9 % — ABNORMAL HIGH (ref 0.0–7.0)
Eosinophils Absolute: 0.4 10*3/uL (ref 0.0–0.5)
HGB: 12 g/dL (ref 11.6–15.9)
LYMPH%: 26.5 % (ref 14.0–49.7)
MCH: 27.1 pg (ref 25.1–34.0)
MCV: 88 fL (ref 79.5–101.0)
MONO%: 11.3 % (ref 0.0–14.0)
NEUT#: 2 10*3/uL (ref 1.5–6.5)
Platelets: 299 10*3/uL (ref 145–400)
RDW: 17.6 % — ABNORMAL HIGH (ref 11.2–14.5)

## 2011-11-10 LAB — PROTIME-INR: Protime: 25.2 Seconds — ABNORMAL HIGH (ref 10.6–13.4)

## 2011-11-10 MED ORDER — ONDANSETRON HCL 8 MG PO TABS
8.0000 mg | ORAL_TABLET | Freq: Once | ORAL | Status: AC
  Administered 2011-11-10: 8 mg via ORAL

## 2011-11-10 MED ORDER — BORTEZOMIB CHEMO SQ INJECTION 3.5 MG (2.5MG/ML)
1.3000 mg/m2 | Freq: Once | INTRAMUSCULAR | Status: AC
  Administered 2011-11-10: 2.25 mg via SUBCUTANEOUS
  Filled 2011-11-10: qty 2.25

## 2011-11-10 MED ORDER — ZOLEDRONIC ACID 4 MG/100ML IV SOLN
4.0000 mg | Freq: Once | INTRAVENOUS | Status: AC
  Administered 2011-11-10: 4 mg via INTRAVENOUS
  Filled 2011-11-10: qty 100

## 2011-11-10 NOTE — Telephone Encounter (Signed)
Gave patient appointment for 11-25-2011 at 8:00am printed out calendar and gave to the patient

## 2011-11-10 NOTE — Telephone Encounter (Signed)
THIS REFILL REQUEST FOR REVLIMID WAS GIVEN TO DR.MAGRINAT'S NURSE, VAL DODD,RN. 

## 2011-11-10 NOTE — Progress Notes (Signed)
ID: Laura Hester   DOB: 1956/02/03  MR#: [id number]  WUJ#:[id number]  HISTORY OF PRESENT ILLNESS: Tenasia has a history of iron deficiency anemia secondary to menorrhagia. However, as this persisted despite near-total cessation of her menses and despite iron supplementation, she was referred to Dr. Dorena Cookey for GI evaluation. He found the patient's Hb to have decreased from 9.17 June 2011 to 8.3 07/29/2011. MCV was 95.9, ferritin 93.3, with a normal folate and negative ANA. Guaiacs were negative. Creatinine was 0.77.  t-transglutaminase IgA was normal at 2, but the total IgA was 6.9 g. Accordingly on 08/04/2011 an SPEP was obtained, showing an M-spike of 4.5 g, with a secondary M-spike of 0.4 g. Total protein was 10.0 with albumin 3.2, calcium 10.2. Bone survey 08/26/2011 showed multiple bone lesions consistent with myeloma and bone marrow biopsy 09/01/2011 confirmed the diagnosis with 75% myeloma cells in the marrow. [name]' subsequent history is detailed below.  INTERVAL HISTORY: Conception comes today with her husband Laura Hester for followup of her multiple myeloma. She is receiving bortezomib weekly, zoledronic acid monthly, and Revlimid at 25 mg 14 days on 7 days off. She takes dexamethasone 40 mg once a week. She is due for her weekly bortezomib injection today.  REVIEW OF SYSTEMS: Compared to her initial functional status, she has shown marked improvement. She no longer feels tired all the time, in fact describes herself as feeling "wonderful". Doubtless this is because of the resolution of her anemia. She has a soft bowel movements daily, except the day after her dexamethasone. She has no peripheral neuropathy symptoms, although she feels her fingers are cold sometimes. She gets "wired up" with the dexamethasone, but sleeps that night thinks to lorazepam, and she has no other side effects from any of her chemotherapy meds that she is aware of. On the week off from Revlimid she feels marginally more  energy, but this is really a very minor difference. She has not had a period since January of this year, and the one before that was May of 2012. A detailed review of systems is otherwise noncontributory.  PAST MEDICAL HISTORY: Past Medical History  Diagnosis Date  . Fibrocystic breast disease   . Allergic rhinitis   . HTN (hypertension)   . Prediabetes   . SUI (stress urinary incontinence, female)   . Obesity   . Glaucoma   . Asthma   . Multiple myeloma, stage 3   . Anemia     PAST SURGICAL HISTORY: Past Surgical History  Procedure Date  . Cholecystectomy 1995  . Tonsillectomy and adenoidectomy   . Cesarean section   . Carpal tunnel release     FAMILY HISTORY Family History  Problem Relation Age of Onset  . Heart disease Father   . Breast cancer Mother   . Heart disease Mother   . Diabetes Paternal Grandfather   . Hypertension Maternal Grandmother   . Hypertension Brother   The patient's parents are in their mid-70s.Her mother has a remote history of breast cancer. She has two brothers, one her twin; no sisters. There is no other history of cancer in the family to her knowledge  GYNECOLOGIC HISTORY: Menarche age 78, first live birth age 61, GX P38. Last period was January 2013, before that May 2012.  SOCIAL HISTORY: She is a Software engineer and taught kindergarten until 2012. Her husband of 31 years, Laura Hester, graduated from [employer] and works in Regulatory affairs officer. Daughter Laura Hester works for [employer] and as noted was married  June 1st. Daughter Laura Hester studies business at [employer]. The patient attends the [employer]   ADVANCED DIRECTIVES:  HEALTH MAINTENANCE: History  Substance Use Topics  . Smoking status: Never Smoker   . Smokeless tobacco: Never Used  . Alcohol Use: No     Colonoscopy: 2009/[facility]  PAP: UTD  Bone density: never  Lipid panel: Swayne/ "excellent"  Allergies  Allergen Reactions  . Wellbutrin (Bupropion Hcl) Hives     Current Outpatient Prescriptions  Medication Sig Dispense Refill  . acyclovir (ZOVIRAX) 400 MG tablet       . budesonide-formoterol (SYMBICORT) 80-4.5 MCG/ACT inhaler Inhale 2 puffs into the lungs 2 (two) times daily.  1 Inhaler  12  . dexamethasone (DECADRON) 4 MG tablet Take 4 mg by mouth daily.      . Ferrous Sulfate (IRON) 325 (65 FE) MG TABS Take 1 tablet by mouth daily.      Marland Kitchen lenalidomide (REVLIMID) 25 MG capsule Take 1 capsule (25 mg total) by mouth daily.  35 capsule  12  . LORazepam (ATIVAN) 0.5 MG tablet Take 1 tablet (0.5 mg total) by mouth at bedtime as needed for anxiety.  30 tablet  0  . naproxen sodium (ANAPROX) 220 MG tablet Take 220 mg by mouth 2 (two) times daily with a meal.      . omeprazole (PRILOSEC) 40 MG capsule Take 1 capsule (40 mg total) by mouth daily.  30 capsule  5  . sulfamethoxazole-trimethoprim (BACTRIM DS) 800-160 MG per tablet       . traMADol (ULTRAM) 50 MG tablet Take 50 mg by mouth every 6 (six) hours as needed.      . warfarin (COUMADIN) 5 MG tablet 1 1/2 (One and one-half) tab PO daily or as directed  45 tablet  3    OBJECTIVE: midddle aged White woman who appears well Filed Vitals:   11/10/11 0955  BP: 174/72  Pulse: 82  Temp: 98.2 F (36.8 C)     Body mass index is 29.91 kg/(m^2).    ECOG FS: 1  Filed Weights   11/10/11 0955  Weight: 166 lb 3.2 oz (75.388 kg)   Physical Exam: HEENT:  Sclerae anicteric.  Oropharynx clear.  No mucositis or candidiasis.   Nodes:  No cervical, supraclavicular, or axillary lymphadenopathy palpated.  Breast Exam:  Deferred   Lungs:  Clear to auscultation bilaterally.  No crackles, rhonchi, or wheezes.   Heart:  Regular rate and rhythm.  No murmur appreciated Abdomen:  Soft, nontender.  Positive bowel sounds.  No organomegaly or masses palpated.   Musculoskeletal:  No focal spinal tenderness to palpation.  Extremities:  No peripheral edema   Skin:  No suspicious findings  Neuro:  Nonfocal. Alert and  oriented x3.    LAB RESULTS: Lab Results  Component Value Date   WBC 3.9 11/10/2011   NEUTROABS 2.0 11/10/2011   HGB 12.0 11/10/2011   HCT 38.9 11/10/2011   MCV 88.0 11/10/2011   PLT 299 11/10/2011      Chemistry      Component Value Date/Time   NA 144 11/03/2011 0853   K 3.7 11/03/2011 0853   CL 109 11/03/2011 0853   CO2 25 11/03/2011 0853   BUN 8 11/03/2011 0853   CREATININE 0.52 11/03/2011 0853      Component Value Date/Time   CALCIUM 8.6 11/03/2011 0853   ALKPHOS 134* 11/03/2011 0853   AST 10 11/03/2011 0853   ALT 12 11/03/2011 0853   BILITOT 0.4  11/03/2011 0853     Protein electrophoresis, serum Status: Final result MyChart: Not Released Next appt with me: 11/25/11 Dx: Multiple myeloma         Range  1wk ago (11/03/11)  4wk ago (10/13/11)  15mo ago (10/06/11)  15mo ago (09/29/11)  75mo ago (08/14/11)     Total Protein, serum electrophor  6.0 - 8.3 g/dL  6.9   7.3   8.2   8.8 (H)   9.4 (H)      Albumin ELP  55.8 - 66.1 %  53.5 (L)   41.2 (L)   36.2 (L)   30.6 (L)   30.0 (L)      Alpha-1-Globulin  2.9 - 4.9 %  4.0   3.9   3.6   3.5   2.6 (L)      Alpha-2-Globulin  7.1 - 11.8 %  8.8   7.1   7.0 (L)   5.8 (L)   6.0 (L)      Beta Globulin  4.7 - 7.2 %  8.6 (H)   39.2 (H)   43.9 (H)   50.1 (H)   51.4 (H)      Beta 2  3.2 - 6.5 %  16.2 (H)   5.0   6.1   7.5 (H)   7.4 (H)      Gamma Globulin  11.1 - 18.8 %  8.9 (L)   3.6 (L)   3.2 (L)   2.5 (L)   2.6 (L)      M-Spike, %  g/dL  1.61   0.96EA   5.40JW   3.81CM   4.31CM     Comments:      MSPIKE 2: 0.21       SPE Interp.   *   *CM   *CM   *CM   *CM     Comments:      Two restricted bands consistent with monoclonal proteins are present.The monoclonal protein peaks account for 0.86 g/dL of the total 1.19J/YN of protein in the beta-2 region and 0.21 g/dL of the total 8.29F/AO of protein in the gamma region. Results are consistent with SPE performed on 7/2/13Reviewed by Dallas Breeding, MD, PhD, [facility] (Electronic Signature onFile)       Results for EMIL, KLASSEN (MRN [id number]) as of 11/10/2011 10:20  Ref. Range 08/14/2011 09:23 09/29/2011 12:58 10/06/2011 08:47 10/13/2011 09:48 11/03/2011 08:53  Lambda Free Lght Chn Latest Range: 0.57-2.63 mg/dL 696.29 (H) 52.84 (H) 13.24 (H) 30.00 (H) 11.40 (H)      Lab 11/10/11 0936  INR 2.10    STUDIES: METASTATIC BONE SURVEY 08/26/2011 Comparison: None  Findings: There are scattered small lucent lesions in the axial and  appendicular skeleton consistent with multiple myeloma. This is  most notable in the skull. No pathologic fracture. Degenerative  changes noted mainly in the lumbar spine.  IMPRESSION:  Diffuse scattered myelomatous bone lesions in the axial and  appendicular skeleton.  Original Report Authenticated By: P. Loralie Champagne, M.D.  Patient Name: ABRA, LINGENFELTER Accession #: [id number] DOB: 04/05/1956 Age: 56 Gender: F Client Name [facility] Collected Date: 09/01/2011 Received Date: 09/01/2011 Physician: Richarda Overlie Chart #: MRN # : [id number] Physician cc: Ruthann Cancer, MD Race:W Visit #: [id number] BONE MARROW REPORT FINAL DIAGNOSIS Diagnosis Bone Marrow, Aspirate,Biopsy, and Clot, right iliac - HYPERCELLULAR BONE MARROW WITH PLASMA CELL MYELOMA (PLASMA CELLS 75%). PERIPHERAL BLOOD: - NORMOCYTIC-NORMOCHROMIC ANEMIA. Guerry Bruin MD Pathologist, Electronic Signature (Case signed 09/03/2011) GROSS AND  MICROSCOPIC INFORMATION Specimen Clinical Information Evaluate for multiple myeloma. (lw) Source Bone Marrow, Aspirate,Biopsy, and Clot, right iliac Microscopic LAB DATA: CBC performed on 09/01/2011 shows: WBC 4.81 K/ul Neutrophils 69% HB 8.7 g/dl Lymphocytes 16% HCT 10.9 % Monocytes 8% MCV 95.4 fL Eosinophils 0% RDW 20.5 % Basophils 0% PLT 303 K/ul PERIPHERAL BLOOD SMEAR: The red blood cells display prominent anisocytosis with normocytic and macrocytic cells. There is mild poikilocytosis with elliptocytes and teardrop cells. There is  mild polychromasia. There is rouleaux formation. Some of the neutrophils display mild toxic granulation but there is no significant neutrophilic left shift. The platelets are abundant and appear normogranular. 1 of 3 FINAL for JLEIGH, STRIPLIN ([id number]) Microscopic(continued) BONE MARROW ASPIRATE: Erythroid precursors: Progressive maturation with nuclear cytoplasmic dyssynchrony or irregular/lobulated nuclei. Granulocytic precursors: Orderly and progressive maturation for the most part. Megakaryocytes: Abundant with predominately normal morphology. Lymphocytes/plasma cells: The plasma cell are markedly increased in number representing 75% of all cells with prominent atypical cytologic features characterized by cytomegaly, fine chromatin, and prominent nucleoli. Significant lymphoid aggregates are not present. TOUCH PREPARATIONS: Abundance of atypical plasma cells present. CLOT and BIOPSY: The sections show 80 to 100% cellularity. The medullary space is diffusely infiltrated by atypical plasma cells characterized by vesicular chromatin and prominent nucleoli. Residual myeloid hematopoiesis is seen in the background. Immunohistochemical stains for CD138, kappa and lambda were performed on clot and biopsy. CD138 highlights the abundant plasma cells. Kappa and lambda stains show lambda light chain restriction within plasma cells. IRON STAIN: Iron stains are performed on a bone marrow aspirate smear and section of clot. The controls stained appropriately. Storage Iron: Decreased. Ringed Sideroblasts: Absent. ADDITIONAL DATA / TESTING: Specimen was sent for cytogenetic analysis and a separate report will follow. (BNS:gt, 09/02/11) Specimen Table Bone Marrow count performed on 500 cells shows: Blasts: 0% Myeloid 8% Promyelocyts: 0% Myelocytes: 1% Erythroid 12% Metamyelocyts: 1% Bands: 4% Lymphocytes: 5% Neutrophils: 1% Eosinophils: 1% Plasma Cells: 75% Basophils: 0% Monocytes: 0%  M:E ratio: 0.72 Gross Received in [facility] is a 0.4 cm aggregate of dark red tissue fragments which are submitted in toto in cassette A. Received in [facility] is a 1.0 m in length tan brown bone core biopsy which is submitted in toto after decalcification in cassette B. (JK:mw 09-02-11) Stain(s) Used in Diagnosis The following stain(s) were used in diagnosing the case: KAPPA, Iron Stain, LAMBDA, CD 138. The control(s) stained appropriately. 2 of 3 FINAL for PURVI, RUEHL ([id number]) Report signed out from following location(s) [facility] [address],[place] [address].CLIA:[id number],CAP:[id number]., [facility] [address], [place], [place] [address]. CLIA #: [id number], 3  ASSESSMENT: 56 y.o.  [place] woman presenting with anemia, found to have an IgA >6g, with an M-spike of 4.5 g (and a second M-spike of 0.4g), urine IFE showing IgA-lambda and lambda light chains, creatinine 0.77, calcium 10.0, albumin 3.1, and beta-2-microglobulin 5.51; with bone survey showing multiple myeloma lesions and bone marrow biopsy 09/01/2011 showing 75% myeloma cells. 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 She has an appointment with Dr. Sheryn Bison at [facility] next week, and of course the big  question regards transplant planning. At our end, we likely will continue the current treatment to best response, then repeat a bone marrow biopsy. She has an appointment with me August 13 to review her impressions of [facility] and their recommendations. She knows to call for any problems that may develop before the next  visit.    MAGRINAT,GUSTAV C    11/10/2011

## 2011-11-12 ENCOUNTER — Other Ambulatory Visit: Payer: Self-pay | Admitting: *Deleted

## 2011-11-12 DIAGNOSIS — C9000 Multiple myeloma not having achieved remission: Secondary | ICD-10-CM

## 2011-11-12 MED ORDER — LENALIDOMIDE 25 MG PO CAPS: 25.0000 mg | ORAL_CAPSULE | Freq: Every day | ORAL | Status: DC

## 2011-11-12 NOTE — Progress Notes (Signed)
Rec'd fax from [facility] stating that referral was rec'd.  This information given to desk RN.

## 2011-11-17 ENCOUNTER — Encounter: Payer: Self-pay | Admitting: Pharmacist

## 2011-11-17 ENCOUNTER — Encounter: Payer: Self-pay | Admitting: *Deleted

## 2011-11-17 ENCOUNTER — Telehealth: Payer: Self-pay | Admitting: Certified Registered Nurse Anesthetist

## 2011-11-17 ENCOUNTER — Ambulatory Visit (HOSPITAL_BASED_OUTPATIENT_CLINIC_OR_DEPARTMENT_OTHER): Payer: BC Managed Care – PPO

## 2011-11-17 ENCOUNTER — Other Ambulatory Visit (HOSPITAL_BASED_OUTPATIENT_CLINIC_OR_DEPARTMENT_OTHER): Payer: BC Managed Care – PPO | Admitting: Lab

## 2011-11-17 ENCOUNTER — Other Ambulatory Visit: Payer: Self-pay | Admitting: Oncology

## 2011-11-17 VITALS — BP 153/73 | HR 75 | Temp 99.1°F

## 2011-11-17 DIAGNOSIS — Z5112 Encounter for antineoplastic immunotherapy: Secondary | ICD-10-CM

## 2011-11-17 DIAGNOSIS — Z7901 Long term (current) use of anticoagulants: Secondary | ICD-10-CM

## 2011-11-17 DIAGNOSIS — C9000 Multiple myeloma not having achieved remission: Secondary | ICD-10-CM

## 2011-11-17 LAB — CBC WITH DIFFERENTIAL/PLATELET
Basophils Absolute: 0.1 10*3/uL (ref 0.0–0.1)
Eosinophils Absolute: 0.3 10*3/uL (ref 0.0–0.5)
HCT: 41.5 % (ref 34.8–46.6)
HGB: 13 g/dL (ref 11.6–15.9)
LYMPH%: 18.5 % (ref 14.0–49.7)
MCV: 88.6 fL (ref 79.5–101.0)
MONO#: 0.7 10*3/uL (ref 0.1–0.9)
MONO%: 11.7 % (ref 0.0–14.0)
NEUT#: 3.9 10*3/uL (ref 1.5–6.5)
Platelets: 334 10*3/uL (ref 145–400)
WBC: 6.3 10*3/uL (ref 3.9–10.3)

## 2011-11-17 LAB — PROTIME-INR
INR: 2.6 (ref 2.00–3.50)
Protime: 31.2 Seconds — ABNORMAL HIGH (ref 10.6–13.4)

## 2011-11-17 MED ORDER — ONDANSETRON HCL 8 MG PO TABS
8.0000 mg | ORAL_TABLET | Freq: Once | ORAL | Status: AC
  Administered 2011-11-17: 8 mg via ORAL

## 2011-11-17 MED ORDER — BORTEZOMIB CHEMO SQ INJECTION 3.5 MG (2.5MG/ML)
1.3000 mg/m2 | Freq: Once | INTRAMUSCULAR | Status: AC
  Administered 2011-11-17: 2.25 mg via SUBCUTANEOUS
  Filled 2011-11-17: qty 2.25

## 2011-11-17 NOTE — Telephone Encounter (Signed)
Fredirick Maudlin from [facility] returned call at 1445hr, stated that pt. Laura Hester is scheduled to be seen at [facility] this coming Thursday and she is waiting for reports and scans from [facility].  Per Misty Stanley, she is refaxing a letter of request from 11/03/11 today and will require all requested documents FEDEX to [facility] prior to pt's visit this Thursday.  Spoke to  [employer].  in HIM, this process has been started.  Selena Batten  will f/u with Fredirick Maudlin to ensure all documents will be at [facility] prior to pt.' appt.  HL

## 2011-11-17 NOTE — Patient Instructions (Addendum)
[facility] Discharge Instructions for Patients Receiving Chemotherapy  Today you received the following chemotherapy agents: velcade  To help prevent nausea and vomiting after your treatment, we encourage you to take your nausea medication.  Take it as often as prescribed.     If you develop nausea and vomiting that is not controlled by your nausea medication, call the clinic. If it is after clinic hours your family physician or the after hours number for the clinic or go to the Emergency Department.   BELOW ARE SYMPTOMS THAT SHOULD BE REPORTED IMMEDIATELY:  *FEVER GREATER THAN 100.5 F  *CHILLS WITH OR WITHOUT FEVER  NAUSEA AND VOMITING THAT IS NOT CONTROLLED WITH YOUR NAUSEA MEDICATION  *UNUSUAL SHORTNESS OF BREATH  *UNUSUAL BRUISING OR BLEEDING  TENDERNESS IN MOUTH AND THROAT WITH OR WITHOUT PRESENCE OF ULCERS  *URINARY PROBLEMS  *BOWEL PROBLEMS  UNUSUAL RASH Items with * indicate a potential emergency and should be followed up as soon as possible.  Feel free to call the clinic you have any questions or concerns. The clinic phone number is [phone].   I have been informed and understand all the instructions given to me. I know to contact the clinic, my physician, or go to the Emergency Department if any problems should occur. I do not have any questions at this time, but understand that I may call the clinic during office hours   should I have any questions or need assistance in obtaining follow up care.    __________________________________________  _____________  __________ Signature of Patient or Authorized Representative            Date                   Time    __________________________________________ Nurse's Signature    

## 2011-11-17 NOTE — Progress Notes (Unsigned)
Per AB, have notified pt to continue present dose of coumadin 7.5x 2 days alternating with 5mg  will recheck 11/24/11

## 2011-11-17 NOTE — Telephone Encounter (Signed)
Received call from Fredirick Maudlin from [facility] at [facility] at [phone]. Call returned  At 13:05 with no answer.  Message left for Misty Stanley to call us back and informed her that Dr. Darnelle Catalan is off today and AMy Allyson Sabal will be able to assist if she would need anything today on her voice message.

## 2011-11-19 LAB — KAPPA/LAMBDA LIGHT CHAINS
Kappa free light chain: 0.23 mg/dL — ABNORMAL LOW (ref 0.33–1.94)
Kappa:Lambda Ratio: 0.04 — ABNORMAL LOW (ref 0.26–1.65)
Lambda Free Lght Chn: 5.19 mg/dL — ABNORMAL HIGH (ref 0.57–2.63)

## 2011-11-19 LAB — PROTEIN ELECTROPHORESIS, SERUM
Albumin ELP: 57.9 % (ref 55.8–66.1)
Alpha-1-Globulin: 4.4 % (ref 2.9–4.9)
Alpha-2-Globulin: 10.4 % (ref 7.1–11.8)
Beta 2: 10.7 % — ABNORMAL HIGH (ref 3.2–6.5)
Beta Globulin: 7.5 % — ABNORMAL HIGH (ref 4.7–7.2)
Gamma Globulin: 9.1 % — ABNORMAL LOW (ref 11.1–18.8)
M-Spike, %: 0.38 g/dL
Total Protein, Serum Electrophoresis: 6.4 g/dL (ref 6.0–8.3)

## 2011-11-19 LAB — COMPREHENSIVE METABOLIC PANEL
Albumin: 4.1 g/dL (ref 3.5–5.2)
Alkaline Phosphatase: 114 U/L (ref 39–117)
BUN: 9 mg/dL (ref 6–23)
CO2: 26 mEq/L (ref 19–32)
Calcium: 9 mg/dL (ref 8.4–10.5)
Glucose, Bld: 91 mg/dL (ref 70–99)
Potassium: 3.6 mEq/L (ref 3.5–5.3)

## 2011-11-20 NOTE — Telephone Encounter (Signed)
[employer] faxed confirmation of Revlimid prescription shipment.  Shipped on 11-19-2011 with next business day delivery.

## 2011-11-24 ENCOUNTER — Other Ambulatory Visit: Payer: Self-pay | Admitting: Oncology

## 2011-11-24 ENCOUNTER — Other Ambulatory Visit (HOSPITAL_BASED_OUTPATIENT_CLINIC_OR_DEPARTMENT_OTHER): Payer: BC Managed Care – PPO

## 2011-11-24 ENCOUNTER — Other Ambulatory Visit: Payer: BC Managed Care – PPO

## 2011-11-24 ENCOUNTER — Ambulatory Visit (HOSPITAL_BASED_OUTPATIENT_CLINIC_OR_DEPARTMENT_OTHER): Payer: BC Managed Care – PPO

## 2011-11-24 VITALS — BP 146/81 | HR 81 | Temp 98.0°F | Resp 18

## 2011-11-24 DIAGNOSIS — Z5112 Encounter for antineoplastic immunotherapy: Secondary | ICD-10-CM

## 2011-11-24 DIAGNOSIS — C9000 Multiple myeloma not having achieved remission: Secondary | ICD-10-CM

## 2011-11-24 DIAGNOSIS — Z7901 Long term (current) use of anticoagulants: Secondary | ICD-10-CM

## 2011-11-24 LAB — COMPREHENSIVE METABOLIC PANEL
Alkaline Phosphatase: 125 U/L — ABNORMAL HIGH (ref 39–117)
CO2: 27 mEq/L (ref 19–32)
Creatinine, Ser: 0.57 mg/dL (ref 0.50–1.10)
Glucose, Bld: 93 mg/dL (ref 70–99)
Sodium: 142 mEq/L (ref 135–145)
Total Bilirubin: 0.3 mg/dL (ref 0.3–1.2)

## 2011-11-24 LAB — PROTIME-INR
INR: 2.9 (ref 2.00–3.50)
Protime: 34.8 Seconds — ABNORMAL HIGH (ref 10.6–13.4)

## 2011-11-24 LAB — CBC WITH DIFFERENTIAL/PLATELET
BASO%: 3.5 % — ABNORMAL HIGH (ref 0.0–2.0)
Eosinophils Absolute: 0.1 10*3/uL (ref 0.0–0.5)
HCT: 41.4 % (ref 34.8–46.6)
LYMPH%: 18.6 % (ref 14.0–49.7)
MCHC: 31.6 g/dL (ref 31.5–36.0)
MCV: 88.1 fL (ref 79.5–101.0)
MONO#: 0.5 10*3/uL (ref 0.1–0.9)
MONO%: 8.2 % (ref 0.0–14.0)
NEUT%: 67.5 % (ref 38.4–76.8)
Platelets: 258 10*3/uL (ref 145–400)
RBC: 4.7 10*6/uL (ref 3.70–5.45)
WBC: 5.7 10*3/uL (ref 3.9–10.3)

## 2011-11-24 MED ORDER — ONDANSETRON HCL 8 MG PO TABS
8.0000 mg | ORAL_TABLET | Freq: Once | ORAL | Status: AC
  Administered 2011-11-24: 8 mg via ORAL

## 2011-11-24 MED ORDER — BORTEZOMIB CHEMO SQ INJECTION 3.5 MG (2.5MG/ML)
1.3000 mg/m2 | Freq: Once | INTRAMUSCULAR | Status: AC
  Administered 2011-11-24: 2.25 mg via SUBCUTANEOUS
  Filled 2011-11-24: qty 2.25

## 2011-11-24 NOTE — Patient Instructions (Signed)
[facility] Discharge Instructions for Patients Receiving Chemotherapy  Today you received the following chemotherapy agents Velcade.  To help prevent nausea and vomiting after your treatment, we encourage you to take your nausea medication as prescribed.   If you develop nausea and vomiting that is not controlled by your nausea medication, call the clinic. If it is after clinic hours your family physician or the after hours number for the clinic or go to the Emergency Department.   BELOW ARE SYMPTOMS THAT SHOULD BE REPORTED IMMEDIATELY:  *FEVER GREATER THAN 100.5 F  *CHILLS WITH OR WITHOUT FEVER  NAUSEA AND VOMITING THAT IS NOT CONTROLLED WITH YOUR NAUSEA MEDICATION  *UNUSUAL SHORTNESS OF BREATH  *UNUSUAL BRUISING OR BLEEDING  TENDERNESS IN MOUTH AND THROAT WITH OR WITHOUT PRESENCE OF ULCERS  *URINARY PROBLEMS  *BOWEL PROBLEMS  UNUSUAL RASH Items with * indicate a potential emergency and should be followed up as soon as possible.  One of the nurses will contact you 24 hours after your treatment. Please let the nurse know about any problems that you may have experienced. Feel free to call the clinic you have any questions or concerns. The clinic phone number is [phone].   I have been informed and understand all the instructions given to me. I know to contact the clinic, my physician, or go to the Emergency Department if any problems should occur. I do not have any questions at this time, but understand that I may call the clinic during office hours   should I have any questions or need assistance in obtaining follow up care.    __________________________________________  _____________  __________ Signature of Patient or Authorized Representative            Date                   Time    __________________________________________ Nurse's Signature    

## 2011-11-25 ENCOUNTER — Telehealth: Payer: Self-pay | Admitting: Oncology

## 2011-11-25 ENCOUNTER — Other Ambulatory Visit: Payer: Self-pay | Admitting: Oncology

## 2011-11-25 ENCOUNTER — Ambulatory Visit (HOSPITAL_BASED_OUTPATIENT_CLINIC_OR_DEPARTMENT_OTHER): Payer: BC Managed Care – PPO | Admitting: Oncology

## 2011-11-25 VITALS — BP 170/82 | HR 86 | Temp 98.2°F | Resp 20 | Ht 62.5 in | Wt 166.4 lb

## 2011-11-25 DIAGNOSIS — C9000 Multiple myeloma not having achieved remission: Secondary | ICD-10-CM

## 2011-11-25 NOTE — Telephone Encounter (Signed)
S/w tiffany in scheduling dept regardingh the pt's bone marrow biopsy appt. Pt is aware that she will be contacted with the appt

## 2011-11-25 NOTE — Progress Notes (Signed)
ID: ROSENDA GEFFRARD   DOB: 29-Dec-1955  MR#: [id number]  WUJ#:[id number]  HISTORY OF PRESENT ILLNESS: Lysandra has a history of iron deficiency anemia secondary to menorrhagia. However, as this persisted despite near-total cessation of her menses and despite iron supplementation, she was referred to Dr. Dorena Cookey for GI evaluation. He found the patient's Hb to have decreased from 9.17 June 2011 to 8.3 07/29/2011. MCV was 95.9, ferritin 93.3, with a normal folate and negative ANA. Guaiacs were negative. Creatinine was 0.77.  t-transglutaminase IgA was normal at 2, but the total IgA was 6.9 g. Accordingly on 08/04/2011 an SPEP was obtained, showing an M-spike of 4.5 g, with a secondary M-spike of 0.4 g. Total protein was 10.0 with albumin 3.2, calcium 10.2. Bone survey 08/26/2011 showed multiple bone lesions consistent with myeloma and bone marrow biopsy 09/01/2011 confirmed the diagnosis with 75% myeloma cells in the marrow. [place]' subsequent history is detailed below.  INTERVAL HISTORY: Caitlinn comes today with her husband [name] for followup of her multiple myeloma. She went to [facility] last week and met with Dr. Sheryn Bison, whom they liked well. She received of quite a bit of information about transplant of, which she has studied, and a schedule regarding out when she will stop her treatments, which is operationalized below. At this point it looks like the transplant will most likely take place late September. Her goal is to be completely done and recovered by December 2013 which is when her daughter will be graduating from [employer].  REVIEW OF SYSTEMS: She is "finally feeling like herself". Doubtless this is because her hemoglobin has recovered. Her functional status is excellent. She has a little bit of her runny nose but that is her only complaint and in particular she reports absolutely no peripheral neuropathy symptoms, which is wonderful. A detailed review of systems today was otherwise entirely  negative.  PAST MEDICAL HISTORY: Past Medical History  Diagnosis Date  . Fibrocystic breast disease   . Allergic rhinitis   . HTN (hypertension)   . Prediabetes   . SUI (stress urinary incontinence, female)   . Obesity   . Glaucoma   . Asthma   . Multiple myeloma, stage 3   . Anemia     PAST SURGICAL HISTORY: Past Surgical History  Procedure Date  . Cholecystectomy 1995  . Tonsillectomy and adenoidectomy   . Cesarean section   . Carpal tunnel release     FAMILY HISTORY Family History  Problem Relation Age of Onset  . Heart disease Father   . Breast cancer Mother   . Heart disease Mother   . Diabetes Paternal Grandfather   . Hypertension Maternal Grandmother   . Hypertension Brother   The patient's parents are in their mid-70s.Her mother has a remote history of breast cancer. She has two brothers, one her twin; no sisters. There is no other history of cancer in the family to her knowledge  GYNECOLOGIC HISTORY: Menarche age 16, first live birth age 58, GX P68. Last period was January 2013, before that May 2012.  SOCIAL HISTORY: She is a Software engineer and taught kindergarten until 2012. Her husband of 31 years, [name], graduated from [employer] and works in Regulatory affairs officer. Daughter [name] works for [employer] and as noted was married June 1st. Daughter [name] studies business at [employer]. The patient attends the [employer]   ADVANCED DIRECTIVES:  HEALTH MAINTENANCE: History  Substance Use Topics  . Smoking status: Never Smoker   . Smokeless  tobacco: Never Used  . Alcohol Use: No     Colonoscopy: 2009/[facility]  PAP: UTD  Bone density: never  Lipid panel: [place]/ "excellent"  Allergies  Allergen Reactions  . Wellbutrin (Bupropion Hcl) Hives    Current Outpatient Prescriptions  Medication Sig Dispense Refill  . acyclovir (ZOVIRAX) 400 MG tablet       . budesonide-formoterol (SYMBICORT) 80-4.5 MCG/ACT inhaler Inhale 2 puffs into the lungs  2 (two) times daily.  1 Inhaler  12  . dexamethasone (DECADRON) 4 MG tablet Take 4 mg by mouth daily.      . Ferrous Sulfate (IRON) 325 (65 FE) MG TABS Take 1 tablet by mouth daily.      Marland Kitchen lenalidomide (REVLIMID) 25 MG capsule Take 1 capsule (25 mg total) by mouth daily.  21 capsule  0  . LORazepam (ATIVAN) 0.5 MG tablet Take 1 tablet (0.5 mg total) by mouth at bedtime as needed for anxiety.  30 tablet  0  . naproxen sodium (ANAPROX) 220 MG tablet Take 220 mg by mouth 2 (two) times daily with a meal.      . omeprazole (PRILOSEC) 40 MG capsule Take 1 capsule (40 mg total) by mouth daily.  30 capsule  5  . sulfamethoxazole-trimethoprim (BACTRIM DS) 800-160 MG per tablet       . traMADol (ULTRAM) 50 MG tablet Take 50 mg by mouth every 6 (six) hours as needed.      . warfarin (COUMADIN) 5 MG tablet 1 1/2 (One and one-half) tab PO daily or as directed  45 tablet  3   No current facility-administered medications for this visit.   Facility-Administered Medications Ordered in Other Visits  Medication Dose Route Frequency Provider Last Rate Last Dose  . bortezomib SQ (VELCADE) chemo injection 2.25 mg  1.3 mg/m2 (Treatment Plan Actual) Subcutaneous Once Lowella Dell, MD   2.25 mg at 11/24/11 0933  . ondansetron (ZOFRAN) tablet 8 mg  8 mg Oral Once Lowella Dell, MD   8 mg at 11/24/11 0857    OBJECTIVE: midddle aged White woman who appears well Filed Vitals:   11/25/11 0808  BP: 170/82  Pulse: 86  Temp: 98.2 F (36.8 C)  Resp: 20     Body mass index is 29.95 kg/(m^2).    ECOG FS: 0  Filed Weights   11/25/11 0808  Weight: 166 lb 6.4 oz (75.479 kg)   Sclerae unicteric Oropharynx clear No cervical or supraclavicular adenopathy Lungs no rales or rhonchi Heart regular rate and rhythm Abd benign MSK no focal spinal tenderness, no peripheral edema Neuro: nonfocal Breasts: Deferred   LAB RESULTS: Lab Results  Component Value Date   WBC 5.7 11/24/2011   NEUTROABS 3.9 11/24/2011    HGB 13.1 11/24/2011   HCT 41.4 11/24/2011   MCV 88.1 11/24/2011   PLT 258 11/24/2011      Chemistry      Component Value Date/Time   NA 142 11/24/2011 0836   K 3.9 11/24/2011 0836   CL 106 11/24/2011 0836   CO2 27 11/24/2011 0836   BUN 12 11/24/2011 0836   CREATININE 0.57 11/24/2011 0836      Component Value Date/Time   CALCIUM 8.6 11/24/2011 0836   ALKPHOS 125* 11/24/2011 0836   AST 12 11/24/2011 0836   ALT 14 11/24/2011 0836   BILITOT 0.3 11/24/2011 0836     As of 11/17/2011, the M spike had dropped to 0.38, lambda light chains had decreased to 5.19, and the urine  protein creatinine ratio was less than 3.     Lab 11/24/11 0836  INR 2.90    STUDIES: METASTATIC BONE SURVEY 08/26/2011 Comparison: None  Findings: There are scattered small lucent lesions in the axial and  appendicular skeleton consistent with multiple myeloma. This is  most notable in the skull. No pathologic fracture. Degenerative  changes noted mainly in the lumbar spine.  IMPRESSION:  Diffuse scattered myelomatous bone lesions in the axial and  appendicular skeleton.  Original Report Authenticated By: P. Loralie Champagne, M.D.  Patient Name: LUGENE, HITT Accession #: [id number] DOB: Aug 16, 1955 Age: 56 Gender: F Client Name [facility] Collected Date: 09/01/2011 Received Date: 09/01/2011 Physician: Richarda Overlie Chart #: MRN # : [id number] Physician cc: Ruthann Cancer, MD Race:W Visit #: [id number] BONE MARROW REPORT FINAL DIAGNOSIS Diagnosis Bone Marrow, Aspirate,Biopsy, and Clot, right iliac - HYPERCELLULAR BONE MARROW WITH PLASMA CELL MYELOMA (PLASMA CELLS 75%). PERIPHERAL BLOOD: - NORMOCYTIC-NORMOCHROMIC ANEMIA. Guerry Bruin MD Pathologist, Electronic Signature (Case signed 09/03/2011) GROSS AND MICROSCOPIC INFORMATION Specimen Clinical Information Evaluate for multiple myeloma. (lw) Source Bone Marrow, Aspirate,Biopsy, and Clot, right iliac Microscopic LAB DATA: CBC performed on  09/01/2011 shows: WBC 4.81 K/ul Neutrophils 69% HB 8.7 g/dl Lymphocytes 30% HCT 86.5 % Monocytes 8% MCV 95.4 fL Eosinophils 0% RDW 20.5 % Basophils 0% PLT 303 K/ul PERIPHERAL BLOOD SMEAR: The red blood cells display prominent anisocytosis with normocytic and macrocytic cells. There is mild poikilocytosis with elliptocytes and teardrop cells. There is mild polychromasia. There is rouleaux formation. Some of the neutrophils display mild toxic granulation but there is no significant neutrophilic left shift. The platelets are abundant and appear normogranular. 1 of 3 FINAL for GENEVIE, ELMAN ([id number]) Microscopic(continued) BONE MARROW ASPIRATE: Erythroid precursors: Progressive maturation with nuclear cytoplasmic dyssynchrony or irregular/lobulated nuclei. Granulocytic precursors: Orderly and progressive maturation for the most part. Megakaryocytes: Abundant with predominately normal morphology. Lymphocytes/plasma cells: The plasma cell are markedly increased in number representing 75% of all cells with prominent atypical cytologic features characterized by cytomegaly, fine chromatin, and prominent nucleoli. Significant lymphoid aggregates are not present. TOUCH PREPARATIONS: Abundance of atypical plasma cells present. CLOT and BIOPSY: The sections show 80 to 100% cellularity. The medullary space is diffusely infiltrated by atypical plasma cells characterized by vesicular chromatin and prominent nucleoli. Residual myeloid hematopoiesis is seen in the background. Immunohistochemical stains for CD138, kappa and lambda were performed on clot and biopsy. CD138 highlights the abundant plasma cells. Kappa and lambda stains show lambda light chain restriction within plasma cells. IRON STAIN: Iron stains are performed on a bone marrow aspirate smear and section of clot. The controls stained appropriately. Storage Iron: Decreased. Ringed Sideroblasts: Absent. ADDITIONAL DATA / TESTING:  Specimen was sent for cytogenetic analysis and a separate report will follow. (BNS:gt, 09/02/11) Specimen Table Bone Marrow count performed on 500 cells shows: Blasts: 0% Myeloid 8% Promyelocyts: 0% Myelocytes: 1% Erythroid 12% Metamyelocyts: 1% Bands: 4% Lymphocytes: 5% Neutrophils: 1% Eosinophils: 1% Plasma Cells: 75% Basophils: 0% Monocytes: 0% M:E ratio: 0.72 Gross Received in [facility] is a 0.4 cm aggregate of dark red tissue fragments which are submitted in toto in cassette A. Received in [facility] is a 1.0 m in length tan brown bone core biopsy which is submitted in toto after decalcification in cassette B. (JK:mw 09-02-11) Stain(s) Used in Diagnosis The following stain(s) were used in diagnosing the case: KAPPA, Iron Stain, LAMBDA, CD 138. The control(s) stained appropriately. 2 of 3 FINAL for IYSHA, MISHKIN ([id number]) Report signed out  from following location(s) [facility] [address],[place] [address].CLIA:[id number]., [facility] [address], [place], [place] [address]. CLIA #: [id number], 3  ASSESSMENT: 56 y.o.  [place] woman presenting with anemia, found to have an IgA >6g, with an M-spike of 4.5 g (and a second M-spike of 0.4g), urine IFE showing IgA-lambda and lambda light chains, creatinine 0.77, calcium 10.0, albumin 3.1, and beta-2-microglobulin 5.51; with bone survey showing multiple myeloma lesions and bone marrow biopsy 09/01/2011 showing 75% myeloma cells. 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  (2) on prophylactic coumadin, to be discontinued as of 12/13/2011.  PLAN: Britini will receive her final 2 doses of bortezomib  August 19 and August 26. She will stop her lenalidomide at that point, and stop her warfarin and Septra August 31. On September 10 she will have a repeat bone marrow biopsy. She will see me the following week to review those results, and likely she will be seen the third week in September at [facility] to discuss specific dates for her upcoming transplant. At this point I am delighted with her progress, and anticipate a good report from her bone marrow biopsy. She knows to call for any problems that may develop for the next visit.  Akio Hudnall C    11/25/2011

## 2011-11-25 NOTE — Telephone Encounter (Signed)
gve the pt her aug,sept 2013 appt calendar °

## 2011-12-01 ENCOUNTER — Other Ambulatory Visit (HOSPITAL_BASED_OUTPATIENT_CLINIC_OR_DEPARTMENT_OTHER): Payer: BC Managed Care – PPO

## 2011-12-01 ENCOUNTER — Ambulatory Visit (HOSPITAL_BASED_OUTPATIENT_CLINIC_OR_DEPARTMENT_OTHER): Payer: BC Managed Care – PPO

## 2011-12-01 VITALS — BP 155/74 | HR 76 | Temp 98.9°F | Resp 16

## 2011-12-01 DIAGNOSIS — Z7901 Long term (current) use of anticoagulants: Secondary | ICD-10-CM

## 2011-12-01 DIAGNOSIS — Z5112 Encounter for antineoplastic immunotherapy: Secondary | ICD-10-CM

## 2011-12-01 DIAGNOSIS — C9000 Multiple myeloma not having achieved remission: Secondary | ICD-10-CM

## 2011-12-01 LAB — CBC WITH DIFFERENTIAL/PLATELET
Eosinophils Absolute: 0.5 10*3/uL (ref 0.0–0.5)
HGB: 13.6 g/dL (ref 11.6–15.9)
MCV: 87.1 fL (ref 79.5–101.0)
MONO#: 0.5 10*3/uL (ref 0.1–0.9)
MONO%: 10.7 % (ref 0.0–14.0)
NEUT#: 2.8 10*3/uL (ref 1.5–6.5)
RBC: 4.87 10*6/uL (ref 3.70–5.45)
RDW: 18.5 % — ABNORMAL HIGH (ref 11.2–14.5)
WBC: 4.8 10*3/uL (ref 3.9–10.3)
lymph#: 0.9 10*3/uL (ref 0.9–3.3)

## 2011-12-01 LAB — COMPREHENSIVE METABOLIC PANEL
Albumin: 4 g/dL (ref 3.5–5.2)
Alkaline Phosphatase: 110 U/L (ref 39–117)
Calcium: 8.9 mg/dL (ref 8.4–10.5)
Chloride: 105 mEq/L (ref 96–112)
Glucose, Bld: 86 mg/dL (ref 70–99)
Potassium: 4 mEq/L (ref 3.5–5.3)
Sodium: 143 mEq/L (ref 135–145)
Total Protein: 6.5 g/dL (ref 6.0–8.3)

## 2011-12-01 LAB — PROTIME-INR: INR: 2.3 (ref 2.00–3.50)

## 2011-12-01 MED ORDER — BORTEZOMIB CHEMO SQ INJECTION 3.5 MG (2.5MG/ML)
1.3000 mg/m2 | Freq: Once | INTRAMUSCULAR | Status: AC
  Administered 2011-12-01: 2.25 mg via SUBCUTANEOUS
  Filled 2011-12-01: qty 2.25

## 2011-12-01 MED ORDER — ONDANSETRON HCL 8 MG PO TABS
8.0000 mg | ORAL_TABLET | Freq: Once | ORAL | Status: AC
  Administered 2011-12-01: 8 mg via ORAL

## 2011-12-01 NOTE — Patient Instructions (Signed)
[facility] Discharge Instructions for Patients Receiving Chemotherapy  Today you received the following chemotherapy agents velcade  If you develop nausea and vomiting that is not controlled by your nausea medication, call the clinic. If it is after clinic hours your family physician or the after hours number for the clinic or go to the Emergency Department.   BELOW ARE SYMPTOMS THAT SHOULD BE REPORTED IMMEDIATELY:  *FEVER GREATER THAN 100.5 F  *CHILLS WITH OR WITHOUT FEVER  NAUSEA AND VOMITING THAT IS NOT CONTROLLED WITH YOUR NAUSEA MEDICATION  *UNUSUAL SHORTNESS OF BREATH  *UNUSUAL BRUISING OR BLEEDING  TENDERNESS IN MOUTH AND THROAT WITH OR WITHOUT PRESENCE OF ULCERS  *URINARY PROBLEMS  *BOWEL PROBLEMS  UNUSUAL RASH Items with * indicate a potential emergency and should be followed up as soon as possible.  One of the nurses will contact you 24 hours after your treatment. Please let the nurse know about any problems that you may have experienced. Feel free to call the clinic you have any questions or concerns. The clinic phone number is [phone].   I have been informed and understand all the instructions given to me. I know to contact the clinic, my physician, or go to the Emergency Department if any problems should occur. I do not have any questions at this time, but understand that I may call the clinic during office hours   should I have any questions or need assistance in obtaining follow up care.    __________________________________________  _____________  __________ Signature of Patient or Authorized Representative            Date                   Time    __________________________________________ Nurse's Signature    

## 2011-12-08 ENCOUNTER — Other Ambulatory Visit (HOSPITAL_BASED_OUTPATIENT_CLINIC_OR_DEPARTMENT_OTHER): Payer: BC Managed Care – PPO

## 2011-12-08 ENCOUNTER — Ambulatory Visit (HOSPITAL_BASED_OUTPATIENT_CLINIC_OR_DEPARTMENT_OTHER): Payer: BC Managed Care – PPO

## 2011-12-08 VITALS — BP 156/86 | HR 69 | Temp 98.1°F

## 2011-12-08 DIAGNOSIS — C9000 Multiple myeloma not having achieved remission: Secondary | ICD-10-CM

## 2011-12-08 DIAGNOSIS — Z5112 Encounter for antineoplastic immunotherapy: Secondary | ICD-10-CM

## 2011-12-08 LAB — CBC WITH DIFFERENTIAL/PLATELET
Basophils Absolute: 0.1 10*3/uL (ref 0.0–0.1)
Eosinophils Absolute: 0.3 10*3/uL (ref 0.0–0.5)
HGB: 13.9 g/dL (ref 11.6–15.9)
LYMPH%: 18.1 % (ref 14.0–49.7)
MONO#: 0.7 10*3/uL (ref 0.1–0.9)
NEUT#: 4 10*3/uL (ref 1.5–6.5)
Platelets: 296 10*3/uL (ref 145–400)
RBC: 5.19 10*6/uL (ref 3.70–5.45)
WBC: 6.2 10*3/uL (ref 3.9–10.3)
nRBC: 0 % (ref 0–0)

## 2011-12-08 LAB — PROTIME-INR: Protime: 33.6 Seconds — ABNORMAL HIGH (ref 10.6–13.4)

## 2011-12-08 LAB — COMPREHENSIVE METABOLIC PANEL (CC13)
BUN: 11 mg/dL (ref 7.0–26.0)
CO2: 27 mEq/L (ref 22–29)
Calcium: 9 mg/dL (ref 8.4–10.4)
Chloride: 105 mEq/L (ref 98–107)
Creatinine: 0.7 mg/dL (ref 0.6–1.1)

## 2011-12-08 MED ORDER — ZOLEDRONIC ACID 4 MG/5ML IV CONC
4.0000 mg | Freq: Once | INTRAVENOUS | Status: AC
  Administered 2011-12-08: 4 mg via INTRAVENOUS
  Filled 2011-12-08: qty 5

## 2011-12-08 MED ORDER — ONDANSETRON HCL 8 MG PO TABS
8.0000 mg | ORAL_TABLET | Freq: Once | ORAL | Status: AC
  Administered 2011-12-08: 8 mg via ORAL

## 2011-12-08 MED ORDER — BORTEZOMIB CHEMO SQ INJECTION 3.5 MG (2.5MG/ML)
1.3000 mg/m2 | Freq: Once | INTRAMUSCULAR | Status: AC
  Administered 2011-12-08: 2.25 mg via SUBCUTANEOUS
  Filled 2011-12-08: qty 2.25

## 2011-12-08 MED ORDER — SODIUM CHLORIDE 0.9 % IV SOLN
Freq: Once | INTRAVENOUS | Status: AC
  Administered 2011-12-08: 12:00:00 via INTRAVENOUS

## 2011-12-08 NOTE — Patient Instructions (Signed)
[facility] Discharge Instructions for Patients Receiving Chemotherapy  Today you received the following chemotherapy agents Velcade/Zometa  To help prevent nausea and vomiting after your treatment, we encourage you to take your nausea medication as prescribed.  If you develop nausea and vomiting that is not controlled by your nausea medication, call the clinic. If it is after clinic hours your family physician or the after hours number for the clinic or go to the Emergency Department.   BELOW ARE SYMPTOMS THAT SHOULD BE REPORTED IMMEDIATELY:  *FEVER GREATER THAN 100.5 F  *CHILLS WITH OR WITHOUT FEVER  NAUSEA AND VOMITING THAT IS NOT CONTROLLED WITH YOUR NAUSEA MEDICATION  *UNUSUAL SHORTNESS OF BREATH  *UNUSUAL BRUISING OR BLEEDING  TENDERNESS IN MOUTH AND THROAT WITH OR WITHOUT PRESENCE OF ULCERS  *URINARY PROBLEMS  *BOWEL PROBLEMS  UNUSUAL RASH Items with * indicate a potential emergency and should be followed up as soon as possible.  One of the nurses will contact you 24 hours after your treatment. Please let the nurse know about any problems that you may have experienced. Feel free to call the clinic you have any questions or concerns. The clinic phone number is [phone].   I have been informed and understand all the instructions given to me. I know to contact the clinic, my physician, or go to the Emergency Department if any problems should occur. I do not have any questions at this time, but understand that I may call the clinic during office hours   should I have any questions or need assistance in obtaining follow up care.    __________________________________________  _____________  __________ Signature of Patient or Authorized Representative            Date                   Time    __________________________________________ Nurse's Signature

## 2011-12-09 ENCOUNTER — Telehealth: Payer: Self-pay | Admitting: Medical Oncology

## 2011-12-09 NOTE — Telephone Encounter (Signed)
Per MD, no change to current Coumadin dosing.  LMOVM with these instructions and to return call confirming message.

## 2011-12-10 LAB — KAPPA/LAMBDA LIGHT CHAINS
Kappa free light chain: 0.71 mg/dL (ref 0.33–1.94)
Lambda Free Lght Chn: 4.28 mg/dL — ABNORMAL HIGH (ref 0.57–2.63)

## 2011-12-10 LAB — PROTEIN ELECTROPHORESIS, SERUM
Albumin ELP: 59.7 % (ref 55.8–66.1)
Alpha-2-Globulin: 11.8 % (ref 7.1–11.8)
M-Spike, %: 0.4 g/dL
Total Protein, Serum Electrophoresis: 6.7 g/dL (ref 6.0–8.3)

## 2011-12-18 ENCOUNTER — Encounter (HOSPITAL_COMMUNITY): Payer: Self-pay | Admitting: Pharmacy Technician

## 2011-12-22 ENCOUNTER — Other Ambulatory Visit: Payer: Self-pay | Admitting: Radiology

## 2011-12-23 ENCOUNTER — Ambulatory Visit (HOSPITAL_COMMUNITY)
Admission: RE | Admit: 2011-12-23 | Discharge: 2011-12-23 | Disposition: A | Discharge status: Home / Self Care | Payer: BC Managed Care – PPO | Source: Ambulatory Visit | Attending: Oncology | Admitting: Oncology

## 2011-12-23 ENCOUNTER — Encounter (HOSPITAL_COMMUNITY): Payer: Self-pay

## 2011-12-23 DIAGNOSIS — C9000 Multiple myeloma not having achieved remission: Secondary | ICD-10-CM | POA: Insufficient documentation

## 2011-12-23 LAB — CBC
MCH: 26.6 pg (ref 26.0–34.0)
MCHC: 31.6 g/dL (ref 30.0–36.0)
Platelets: 268 10*3/uL (ref 150–400)
RBC: 5.18 MIL/uL — ABNORMAL HIGH (ref 3.87–5.11)
RDW: 16 % — ABNORMAL HIGH (ref 11.5–15.5)

## 2011-12-23 LAB — PROTIME-INR: Prothrombin Time: 12.7 seconds (ref 11.6–15.2)

## 2011-12-23 MED ORDER — SODIUM CHLORIDE 0.9 % IV SOLN
INTRAVENOUS | Status: DC
  Administered 2011-12-23: 08:00:00 via INTRAVENOUS

## 2011-12-23 MED ORDER — MIDAZOLAM HCL 5 MG/5ML IJ SOLN
INTRAMUSCULAR | Status: AC | PRN
  Administered 2011-12-23: 1 mg via INTRAVENOUS
  Administered 2011-12-23: 2 mg via INTRAVENOUS

## 2011-12-23 MED ORDER — FENTANYL CITRATE 0.05 MG/ML IJ SOLN
INTRAMUSCULAR | Status: AC | PRN
  Administered 2011-12-23: 50 ug via INTRAVENOUS
  Administered 2011-12-23: 100 ug via INTRAVENOUS

## 2011-12-23 MED ORDER — HYDROCODONE-ACETAMINOPHEN 5-325 MG PO TABS
1.0000 | ORAL_TABLET | ORAL | Status: DC | PRN
  Filled 2011-12-23: qty 2

## 2011-12-23 NOTE — H&P (Signed)
Laura Hester is an 56 y.o. female.   Chief Complaint: " I'm here for bone marrow biopsy" HPI: Patient with history of multiple myeloma presents today for CT guided bone marrow to assess status post treatment.  Past Medical History  Diagnosis Date  . Fibrocystic breast disease   . Allergic rhinitis   . HTN (hypertension)   . Prediabetes   . SUI (stress urinary incontinence, female)   . Obesity   . Glaucoma   . Asthma   . Multiple myeloma, stage 3   . Anemia     Past Surgical History  Procedure Date  . Cholecystectomy 1995  . Tonsillectomy and adenoidectomy   . Cesarean section   . Carpal tunnel release     Family History  Problem Relation Age of Onset  . Heart disease Father   . Breast cancer Mother   . Heart disease Mother   . Diabetes Paternal Grandfather   . Hypertension Maternal Grandmother   . Hypertension Brother    Social History:  reports that she has never smoked. She has never used smokeless tobacco. She reports that she does not drink alcohol or use illicit drugs.  Allergies:  Allergies  Allergen Reactions  . Wellbutrin (Bupropion Hcl) Hives    Current outpatient prescriptions:acyclovir (ZOVIRAX) 400 MG tablet, Take 400 mg by mouth 2 (two) times daily. , Disp: , Rfl: ;  docusate sodium (COLACE) 100 MG capsule, Take 200 mg by mouth daily., Disp: , Rfl: ;  omeprazole (PRILOSEC) 40 MG capsule, Take 1 capsule (40 mg total) by mouth daily., Disp: 30 capsule, Rfl: 5 Current facility-administered medications:0.9 %  sodium chloride infusion, , Intravenous, Continuous, D Jeananne Rama, PA  Labs pending Review of Systems  Constitutional: Negative for fever and chills.  Respiratory: Negative for cough and shortness of breath.   Cardiovascular: Negative for chest pain.  Gastrointestinal: Negative for nausea, vomiting and abdominal pain.  Musculoskeletal: Positive for back pain.  Neurological: Negative for headaches.  Endo/Heme/Allergies: Does not bruise/bleed  easily.   Filed Vitals:   12/23/11 0757  BP: 149/72  Pulse: 83  Temp: 98.3 F (36.8 C)  TempSrc: Oral  Resp: 18  Height: 5' 2.5" (1.588 m)  Weight: 167 lb (75.751 kg)  SpO2: 100%    Physical Exam  Constitutional: She is oriented to person, place, and time. She appears well-developed and well-nourished.  Cardiovascular: Normal rate and regular rhythm.   Respiratory: Effort normal and breath sounds normal.  GI: Soft. Bowel sounds are normal. There is no tenderness.  Musculoskeletal: Normal range of motion. She exhibits no edema.  Neurological: She is alert and oriented to person, place, and time.     Assessment/Plan Patient with multiple myeloma; plan is for CT guided bone marrow biopsy today to assess treatment response. Details/risks of procedure d/w pt/husband with their understanding and consent.  ALLRED,D KEVIN 12/23/2011, 8:19 AM

## 2011-12-23 NOTE — H&P (Signed)
Agree 

## 2011-12-23 NOTE — Procedures (Signed)
Procedure:  Right iliac BM biopsy Aspirate and core bx via 11 G needle.  No complications.

## 2011-12-29 ENCOUNTER — Other Ambulatory Visit (HOSPITAL_BASED_OUTPATIENT_CLINIC_OR_DEPARTMENT_OTHER): Payer: BC Managed Care – PPO

## 2011-12-29 DIAGNOSIS — C9000 Multiple myeloma not having achieved remission: Secondary | ICD-10-CM

## 2011-12-29 DIAGNOSIS — E8809 Other disorders of plasma-protein metabolism, not elsewhere classified: Secondary | ICD-10-CM

## 2011-12-29 LAB — COMPREHENSIVE METABOLIC PANEL (CC13)
ALT: 15 U/L (ref 0–55)
Albumin: 4 g/dL (ref 3.5–5.0)
Alkaline Phosphatase: 99 U/L (ref 40–150)
Glucose: 92 mg/dl (ref 70–99)
Potassium: 3.8 mEq/L (ref 3.5–5.1)
Sodium: 143 mEq/L (ref 136–145)
Total Bilirubin: 0.5 mg/dL (ref 0.20–1.20)
Total Protein: 7.2 g/dL (ref 6.4–8.3)

## 2011-12-29 LAB — PROTEIN / CREATININE RATIO, URINE: Creatinine, Urine: 21.5 mg/dL

## 2011-12-29 LAB — PROTIME-INR
INR: 1 — ABNORMAL LOW (ref 2.00–3.50)
Protime: 12 Seconds (ref 10.6–13.4)

## 2011-12-29 LAB — CBC WITH DIFFERENTIAL/PLATELET
EOS%: 2 % (ref 0.0–7.0)
LYMPH%: 28.2 % (ref 14.0–49.7)
MCH: 27.5 pg (ref 25.1–34.0)
MCV: 86.2 fL (ref 79.5–101.0)
MONO%: 5.9 % (ref 0.0–14.0)
RBC: 5.37 10*6/uL (ref 3.70–5.45)
RDW: 17.3 % — ABNORMAL HIGH (ref 11.2–14.5)

## 2011-12-30 ENCOUNTER — Ambulatory Visit (HOSPITAL_BASED_OUTPATIENT_CLINIC_OR_DEPARTMENT_OTHER): Payer: BC Managed Care – PPO | Admitting: Oncology

## 2011-12-30 ENCOUNTER — Telehealth: Payer: Self-pay | Admitting: *Deleted

## 2011-12-30 ENCOUNTER — Encounter: Payer: Self-pay | Admitting: Oncology

## 2011-12-30 ENCOUNTER — Other Ambulatory Visit: Payer: BC Managed Care – PPO | Admitting: Lab

## 2011-12-30 VITALS — BP 147/76 | HR 84 | Temp 98.5°F | Resp 20 | Ht 62.5 in | Wt 166.1 lb

## 2011-12-30 DIAGNOSIS — C9000 Multiple myeloma not having achieved remission: Secondary | ICD-10-CM

## 2011-12-30 NOTE — Telephone Encounter (Signed)
Gave patient appointment for 01-05-2012 zometa at 8:15am    02-11-2012 at 9:00am with the md  Called michelle over the phone to get the zometa appointment

## 2011-12-30 NOTE — Progress Notes (Signed)
ID: NAKENYA MELLER   DOB: 1955/12/02  MR#: [id number]  CSN#:[id number]  HISTORY OF PRESENT ILLNESS: Laura Hester has a history of iron deficiency anemia secondary to menorrhagia. However, as this persisted despite near-total cessation of her menses and despite iron supplementation, she was referred to Dr. Dorena Cookey for GI evaluation. He found the patient's Hb to have decreased from 9.17 June 2011 to 8.3 07/29/2011. MCV was 95.9, ferritin 93.3, with a normal folate and negative ANA. Guaiacs were negative. Creatinine was 0.77.  t-transglutaminase IgA was normal at 2, but the total IgA was 6.9 g. Accordingly on 08/04/2011 an SPEP was obtained, showing an M-spike of 4.5 g, with a secondary M-spike of 0.4 g. Total protein was 10.0 with albumin 3.2, calcium 10.2. Bone survey 08/26/2011 showed multiple bone lesions consistent with myeloma and bone marrow biopsy 09/01/2011 confirmed the diagnosis with 75% myeloma cells in the marrow. Laura Hester' subsequent history is detailed below.  INTERVAL HISTORY: [name] comes today with her husband Laura Hester for followup of her multiple myeloma. She completed her final bortezomib treatment August 26, and stopped all her other medications except a cycle of your and Prilosec August 31. She had her bone marrow biopsy September 10, with very good results as noted below.  REVIEW OF SYSTEMS: She did very well with the bone marrow, which "did not hurt at all". She is walking up to 2 miles a day. She's developed a little bump on the volar aspect of the fourth digit of the right hand, right over the proximal injure phalangeal joint, which I think is a developing sesamoid bone. She has a feeling of stiffness in the right popliteal area, but no erythema, pain, or swelling. She has no peripheral neuropathy. A detailed review of systems is entirely negative otherwise.  PAST MEDICAL HISTORY: Past Medical History  Diagnosis Date  . Fibrocystic breast disease   . Allergic rhinitis   . HTN  (hypertension)   . Prediabetes   . SUI (stress urinary incontinence, female)   . Obesity   . Glaucoma   . Asthma   . Multiple myeloma, stage 3 08/20/11 dx  . Anemia     PAST SURGICAL HISTORY: Past Surgical History  Procedure Date  . Cholecystectomy 1995  . Tonsillectomy and adenoidectomy   . Cesarean section     pt denies this   . Carpal tunnel release     FAMILY HISTORY Family History  Problem Relation Age of Onset  . Heart disease Father   . Breast cancer Mother   . Heart disease Mother   . Diabetes Paternal Grandfather   . Hypertension Maternal Grandmother   . Hypertension Brother   The patient's parents are in their mid-70s.Her mother has a remote history of breast cancer. She has two brothers, one her twin; no sisters. There is no other history of cancer in the family to her knowledge  GYNECOLOGIC HISTORY: Menarche age 39, first live birth age 63, GX P40. Last period was January 2013, before that May 2012.  SOCIAL HISTORY: She is a Software engineer and taught kindergarten until 2012. Her husband of 31 years, Laura Hester, graduated from [employer] and works in Regulatory affairs officer. Daughter Laura Hester works for [employer] and as noted was married June 1st. Daughter Laura Hester studies business at [employer]. The patient attends the [employer]   ADVANCED DIRECTIVES:  HEALTH MAINTENANCE: History  Substance Use Topics  . Smoking status: Never Smoker   . Smokeless tobacco: Never Used  . Alcohol Use: No  Colonoscopy: 2009/[facility]  PAP: UTD  Bone density: never  Lipid panel: Swayne/ "excellent"  Allergies  Allergen Reactions  . Wellbutrin (Bupropion Hcl) Hives    Current Outpatient Prescriptions  Medication Sig Dispense Refill  . acyclovir (ZOVIRAX) 400 MG tablet Take 400 mg by mouth 2 (two) times daily.       Marland Kitchen docusate sodium (COLACE) 100 MG capsule Take 200 mg by mouth daily.      Marland Kitchen omeprazole (PRILOSEC) 40 MG capsule Take 1 capsule (40 mg total) by mouth  daily.  30 capsule  5    OBJECTIVE: midddle aged White woman in no acute distress Filed Vitals:   12/30/11 0816  BP: 147/76  Pulse: 84  Temp: 98.5 F (36.9 C)  Resp: 20     Body mass index is 29.90 kg/(m^2).    ECOG FS: 0  Filed Weights   12/30/11 0816  Weight: 166 lb 1.6 oz (75.342 kg)   Sclerae unicteric Oropharynx clear No cervical or supraclavicular adenopathy Lungs no rales or rhonchi Heart regular rate and rhythm Abd benign MSK no focal spinal tenderness, no peripheral edema Neuro: nonfocal Breasts: Deferred   LAB RESULTS: Lab Results  Component Value Date   WBC 5.0 12/29/2011   NEUTROABS 3.1 12/29/2011   HGB 14.8 12/29/2011   HCT 46.3 12/29/2011   MCV 86.2 12/29/2011   PLT 265 12/29/2011      Chemistry      Component Value Date/Time   NA 143 12/29/2011 0938   NA 143 12/01/2011 0836   K 3.8 12/29/2011 0938   K 4.0 12/01/2011 0836   CL 107 12/29/2011 0938   CL 105 12/01/2011 0836   CO2 24 12/29/2011 0938   CO2 29 12/01/2011 0836   BUN 12.0 12/29/2011 0938   BUN 12 12/01/2011 0836   CREATININE 0.7 12/29/2011 0938   CREATININE 0.52 12/01/2011 0836      Component Value Date/Time   CALCIUM 9.6 12/29/2011 0938   CALCIUM 8.9 12/01/2011 0836   ALKPHOS 99 12/29/2011 0938   ALKPHOS 110 12/01/2011 0836   AST 15 12/29/2011 0938   AST 15 12/01/2011 0836   ALT 15 12/29/2011 0938   ALT 16 12/01/2011 0836   BILITOT 0.50 12/29/2011 0938   BILITOT 0.3 12/01/2011 0836     M-spike stable at 0.40; lambda light chains decreased to 4.28     Lab 12/29/11 0938  INR 1.00*    STUDIES: Patient Name: Laura Hester, Laura Hester Accession #: [id number] DOB: 1955-08-29 Age: 8 Gender: F Client Name [facility] Collected Date: 12/23/2011 Received Date: 12/23/2011 Physician: Irish Lack Chart #: MRN # : [id number] Physician cc: Ruthann Cancer, MD Race:W Visit #: [id number] BONE MARROW REPORT FINAL DIAGNOSIS Diagnosis Bone Marrow, Aspirate,Biopsy, and Clot, rt iliac bone -  SLIGHTLY HYPERCELLULAR BONE MARROW FOR AGE WITH PLASMA CELL NEOPLASM. - TRILINEAGE HEMATOPOIESIS. - SEE COMMENT. PERIPHERAL BLOOD: - NO SIGNIFICANT MORPHOLOGIC ABNORMALITIES. Diagnosis Note While the overall plasma cell component represent 3% of all cells in the aspirate, the clot and biopsy show numerous variably sized aggregates of plasma cells displaying cytologic atypia. Immunohistochemical stains were performed and show that the plasma cells display lambda light chain excess/restriction consistent with plasma cell neoplasm. Clinical correlation is recommended. (BNS:gt, 12/24/11) Laura Bruin MD Pathologist, Electronic Signature (Case signed 12/25/2011) GROSS AND MICROSCOPIC INFORMATION Specimen Clinical Information h/o multiple myeloma, pre bone marrow transplant [jl] Source Bone Marrow, Aspirate,Biopsy, and Clot, rt iliac bone Microscopic LAB DATA: CBC performed on 12/23/2011 shows: WBC 4.0  K/ul Neutrophils 62% HB 13.8 g/dl Lymphocytes 16% HCT 10.9 % Monocytes 1% 1 of 3 FINAL for Laura Hester, Laura Hester ([id number]) Microscopic(continued) MCV 84.4 fL Eosinophils 2% RDW 16.0 % Basophils 2% PLT 268 K/ul PERIPHERAL BLOOD SMEAR: The red blood cells display mild anisopoikilocytosis with minimal polychromasia. The white blood cells show no significant morphologic abnormalities. The platelets are normal in number. BONE MARROW ASPIRATE: Erythroid precursors: Orderly and progressive maturation for the most part. Granulocytic precursors: Orderly and progressive maturation. Megakaryocytes: Abundant with normal morphology. Lymphocytes/plasma cells: The plasma cells represent 3% of all cell in the sample with scattered small aggregates. The plasma cells show atypical cytological features with cytomegaly, fine chromatin and prominent nucleoli. TOUCH PREPARATIONS: A mixture of cell types present with scattered atypical plasma cells. CLOT and BIOPSY: The sections show 40 to 70% cellularity  with a mixture of cell types. In addition, there are several variably sized clusters of atypical plasma cells characterized by cells with cytomegaly, vesicular chromatin and prominent nucleoli. Scattered atypical plasma cells are also present throughout. A few relatively well circumscribed lymphoid aggregates mostly composed of small lymphoid cells are seen. Immunohistochemical stains for CD138, kappa and lambda were performed on clot and biopsy. CD138 highlights the plasma cell component which includes numerous variably sized clusters. Kappa and lambda stains show background staining that hinders overall assessment. Nonetheless, In relatively appropriately stained areas, there is lambda light excess/restriction within plasma cells. IRON STAIN: Iron stains are performed on a bone marrow aspirate smear and section of clot. The controls stained appropriately. Storage Iron: Present. Ringed Sideroblasts: Absent. ADDITIONAL DATA / TESTING: Specimen was sent for cytogenetic analysis and a separate report will follow. (BNS:gt, 12/24/11) Specimen Table Bone Marrow count performed on 500 cells shows: Blasts: 0% Myeloid 57% Promyelocyts: 2% Myelocytes: 9% Erythroid 29% Metamyelocyts: 10% Bands: 15% Lymphocytes: 8% Neutrophils: 11% Eosinophils: 10% Plasma Cells: 3% Basophils: 0% Monocytes: 3% M:E ratio: 2.0:1 2 of 3 FINAL for Laura Hester, Laura Hester ([id number]) Gross Received in Bouin's in a 1.1 x 0.9 x 0.3 cm aggregate of dark red clotted blood which is submitted in toto in Cassette A. Also received in Bouin's are four cylindrical, tan brown bone core biopsies ranging from 0.5 to 2.2 cm in greatest dimension with attached dark red, clotted blood. The specimen is submitted in toto after decalcification in Cassette B. (JNK:eps 12/23/11) Stain(s) Used in Diagnosis The following stain(s) were used in diagnosing the case: KAPPA, Iron Stain, LAMBDA, CD 138. The control(s) stained appropriately. Report  signed out from following location(s) [facility]  ASSESSMENT: 56 y.o.  [place] woman presenting with anemia, found to have an IgA >6g, with an M-spike of 4.5 g (and a second M-spike of 0.4g), urine IFE showing IgA-lambda and lambda light chains, creatinine 0.77, calcium 10.0, albumin 3.1, and beta-2-microglobulin 5.51; with bone survey showing multiple myeloma lesions and bone marrow biopsy 09/01/2011 showing 75% myeloma cells. 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  (2) on prophylactic coumadin, to be discontinued as of 12/13/2011.  (3) and zolendronic acid every 28 days, next dose due 01/05/2012  PLAN: She has had an excellent response to treatment as documented by her lab work and bone marrow biopsy, and she is now ready to proceed to high-dose therapy with  stem cell rescue at [facility]. I have tentatively scheduled her to see me November 5, by which time I think she may be back in this area, but I will of course depend on the timing of her transplant. She will receive zoledronic acid next week and if she is still in [place] than about 5 weeks from now. She knows to call for any problems that may develop before the next visit.  MAGRINAT,GUSTAV C    12/30/2011

## 2011-12-31 LAB — PROTEIN ELECTROPHORESIS, SERUM
Alpha-1-Globulin: 4.8 % (ref 2.9–4.9)
Beta 2: 7.4 % — ABNORMAL HIGH (ref 3.2–6.5)
Beta Globulin: 6.5 % (ref 4.7–7.2)
Gamma Globulin: 10.2 % — ABNORMAL LOW (ref 11.1–18.8)

## 2012-01-05 ENCOUNTER — Ambulatory Visit (HOSPITAL_BASED_OUTPATIENT_CLINIC_OR_DEPARTMENT_OTHER): Payer: BC Managed Care – PPO

## 2012-01-05 VITALS — BP 148/83 | HR 91 | Temp 98.0°F

## 2012-01-05 DIAGNOSIS — C9000 Multiple myeloma not having achieved remission: Secondary | ICD-10-CM

## 2012-01-05 MED ORDER — ZOLEDRONIC ACID 4 MG/5ML IV CONC
4.0000 mg | Freq: Once | INTRAVENOUS | Status: AC
  Administered 2012-01-05: 4 mg via INTRAVENOUS
  Filled 2012-01-05: qty 5

## 2012-01-05 MED ORDER — SODIUM CHLORIDE 0.9 % IV SOLN
Freq: Once | INTRAVENOUS | Status: AC
  Administered 2012-01-05: 08:00:00 via INTRAVENOUS

## 2012-01-05 NOTE — Patient Instructions (Addendum)
[facility] Discharge Instructions for Patients Receiving Chemotherapy  Today you received the following chemotherapy agents Zometa To help prevent nausea and vomiting after your treatment, we encourage you to take your nausea medication as prescribed. If you develop nausea and vomiting that is not controlled by your nausea medication, call the clinic. If it is after clinic hours your family physician or the after hours number for the clinic or go to the Emergency Department.   BELOW ARE SYMPTOMS THAT SHOULD BE REPORTED IMMEDIATELY:  *FEVER GREATER THAN 100.5 F  *CHILLS WITH OR WITHOUT FEVER  NAUSEA AND VOMITING THAT IS NOT CONTROLLED WITH YOUR NAUSEA MEDICATION  *UNUSUAL SHORTNESS OF BREATH  *UNUSUAL BRUISING OR BLEEDING  TENDERNESS IN MOUTH AND THROAT WITH OR WITHOUT PRESENCE OF ULCERS  *URINARY PROBLEMS  *BOWEL PROBLEMS  UNUSUAL RASH Items with * indicate a potential emergency and should be followed up as soon as possible.  One of the nurses will contact you 24 hours after your treatment. Please let the nurse know about any problems that you may have experienced. Feel free to call the clinic you have any questions or concerns. The clinic phone number is [phone].   I have been informed and understand all the instructions given to me. I know to contact the clinic, my physician, or go to the Emergency Department if any problems should occur. I do not have any questions at this time, but understand that I may call the clinic during office hours   should I have any questions or need assistance in obtaining follow up care.    __________________________________________  _____________  __________ Signature of Patient or Authorized Representative            Date                   Time    __________________________________________ Nurse's Signature    

## 2012-02-11 ENCOUNTER — Ambulatory Visit: Payer: BC Managed Care – PPO | Admitting: Oncology

## 2012-03-18 ENCOUNTER — Other Ambulatory Visit: Payer: Self-pay | Admitting: Emergency Medicine

## 2012-03-18 ENCOUNTER — Telehealth: Payer: Self-pay | Admitting: Oncology

## 2012-03-18 DIAGNOSIS — C9000 Multiple myeloma not having achieved remission: Secondary | ICD-10-CM

## 2012-03-18 NOTE — Telephone Encounter (Signed)
S/w the pt and she is aware of her dec 2013 appts

## 2012-03-23 ENCOUNTER — Other Ambulatory Visit: Payer: Self-pay | Admitting: Oncology

## 2012-03-23 DIAGNOSIS — C9000 Multiple myeloma not having achieved remission: Secondary | ICD-10-CM

## 2012-03-25 ENCOUNTER — Other Ambulatory Visit (HOSPITAL_BASED_OUTPATIENT_CLINIC_OR_DEPARTMENT_OTHER): Payer: BC Managed Care – PPO

## 2012-03-25 DIAGNOSIS — C9000 Multiple myeloma not having achieved remission: Secondary | ICD-10-CM

## 2012-03-25 LAB — COMPREHENSIVE METABOLIC PANEL (CC13)
ALT: 131 U/L — ABNORMAL HIGH (ref 0–55)
AST: 25 U/L (ref 5–34)
Albumin: 3.1 g/dL — ABNORMAL LOW (ref 3.5–5.0)
Alkaline Phosphatase: 73 U/L (ref 40–150)
Glucose: 102 mg/dl — ABNORMAL HIGH (ref 70–99)
Potassium: 3.9 mEq/L (ref 3.5–5.1)
Sodium: 143 mEq/L (ref 136–145)
Total Bilirubin: 0.34 mg/dL (ref 0.20–1.20)
Total Protein: 6 g/dL — ABNORMAL LOW (ref 6.4–8.3)

## 2012-03-25 LAB — CBC WITH DIFFERENTIAL/PLATELET
BASO%: 1.4 % (ref 0.0–2.0)
EOS%: 1.3 % (ref 0.0–7.0)
Eosinophils Absolute: 0.1 10*3/uL (ref 0.0–0.5)
LYMPH%: 12.2 % — ABNORMAL LOW (ref 14.0–49.7)
MCH: 30.2 pg (ref 25.1–34.0)
MCHC: 33.1 g/dL (ref 31.5–36.0)
MCV: 91.2 fL (ref 79.5–101.0)
MONO%: 6 % (ref 0.0–14.0)
NEUT#: 7.5 10*3/uL — ABNORMAL HIGH (ref 1.5–6.5)
Platelets: 172 10*3/uL (ref 145–400)
RBC: 4.5 10*6/uL (ref 3.70–5.45)
RDW: 20.2 % — ABNORMAL HIGH (ref 11.2–14.5)

## 2012-03-25 LAB — PROTEIN / CREATININE RATIO, URINE
Creatinine, Urine: 62.2 mg/dL
Protein Creatinine Ratio: 0.08 (ref <0.15)

## 2012-03-29 LAB — PROTEIN ELECTROPHORESIS, SERUM
Albumin ELP: 60.3 % (ref 55.8–66.1)
Beta 2: 5.2 % (ref 3.2–6.5)
Beta Globulin: 6.4 % (ref 4.7–7.2)
Gamma Globulin: 11.1 % (ref 11.1–18.8)

## 2012-03-29 LAB — KAPPA/LAMBDA LIGHT CHAINS: Kappa:Lambda Ratio: 0.79 (ref 0.26–1.65)

## 2012-04-01 ENCOUNTER — Ambulatory Visit (HOSPITAL_BASED_OUTPATIENT_CLINIC_OR_DEPARTMENT_OTHER): Payer: BC Managed Care – PPO | Admitting: Oncology

## 2012-04-01 ENCOUNTER — Other Ambulatory Visit: Payer: Self-pay | Admitting: *Deleted

## 2012-04-01 ENCOUNTER — Telehealth: Payer: Self-pay | Admitting: *Deleted

## 2012-04-01 VITALS — BP 135/83 | HR 86 | Temp 98.8°F | Resp 20 | Ht 62.5 in | Wt 156.8 lb

## 2012-04-01 DIAGNOSIS — C9001 Multiple myeloma in remission: Secondary | ICD-10-CM

## 2012-04-01 DIAGNOSIS — C9000 Multiple myeloma not having achieved remission: Secondary | ICD-10-CM

## 2012-04-01 MED ORDER — VALACYCLOVIR HCL 500 MG PO TABS: 500.0000 mg | ORAL_TABLET | Freq: Every day | ORAL | Status: DC

## 2012-04-01 MED ORDER — SULFAMETHOXAZOLE-TRIMETHOPRIM 800-160 MG PO TABS: 1.0000 | ORAL_TABLET | ORAL | Status: DC

## 2012-04-01 MED ORDER — OMEPRAZOLE 40 MG PO CPDR: 10.0000 mg | DELAYED_RELEASE_CAPSULE | Freq: Every day | ORAL | Status: DC

## 2012-04-01 MED ORDER — PREDNISONE 20 MG PO TABS: 20.0000 mg | ORAL_TABLET | Freq: Every day | ORAL | Status: DC

## 2012-04-01 MED ORDER — OMEPRAZOLE 20 MG PO CPDR: 20.0000 mg | DELAYED_RELEASE_CAPSULE | Freq: Every day | ORAL | Status: DC

## 2012-04-01 NOTE — Telephone Encounter (Signed)
Gave patient appointment for 05-25-2012 lab only 05-27-2012 md only

## 2012-04-01 NOTE — Progress Notes (Signed)
ID: Laura Hester   DOB: [birth year]  MR#: [id number]  CSN#:[id number]  PCP: Sissy Hoff, MD GYN: SU: OTHER MD: Armandina Stammer  HISTORY OF PRESENT ILLNESS: Laura Hester has a history of iron deficiency anemia secondary to menorrhagia. However, as this persisted despite near-total cessation of her menses and despite iron supplementation, she was referred to Dr. Dorena Cookey for GI evaluation. He found the patient's Hb to have decreased from 9.17 June 2011 to 8.3 07/29/2011. MCV was 95.9, ferritin 93.3, with a normal folate and negative ANA. Guaiacs were negative. Creatinine was 0.77.  t-transglutaminase IgA was normal at 2, but the total IgA was 6.9 g. Accordingly on 08/04/2011 an SPEP was obtained, showing an M-spike of 4.5 g, with a secondary M-spike of 0.4 g. Total protein was 10.0 with albumin 3.2, calcium 10.2. Bone survey 08/26/2011 showed multiple bone lesions consistent with myeloma and bone marrow biopsy 09/01/2011 confirmed the diagnosis with 75% myeloma cells in the marrow. Laura Hester' subsequent history is detailed below.  INTERVAL HISTORY: Laura Hester comes today with her husband [name] for followup of her multiple myeloma. After her last visit here she had her stem cell mobilization and autologous transplant at [facility] (02/24/2012). She did remarkably well, aside from a brief episode of engraftment syndrome. She tells me she walks several miles every day, drank so much fluid they had to cut back her IV fluids, and after 15 days in the hospital and 8 in the "family hotel" she came back home December 2.  REVIEW OF SYSTEMS: She is doing "great". She has been reluctant to walk outside I., but has done well of walking inside the house and is planning to start getting out. She understands the issue about some sensitization. Of course she has no hair. She has no unusual headaches, dizziness, or gait imbalance. There has been no nausea or vomiting. She denies altered taste. Her vision is not quite back  to normal but she understands that may take several months. There has been no cough, phlegm production, or pleurisy. She tells me her bowel movements are now "getting back to normal". As been no problem with bladder habits. She denies rash, fever, pain, or unusual fatigue. A detailed review of systems today was otherwise entirely negative  PAST MEDICAL HISTORY: Past Medical History  Diagnosis Date  . Fibrocystic breast disease   . Allergic rhinitis   . HTN (hypertension)   . Prediabetes   . SUI (stress urinary incontinence, female)   . Obesity   . Glaucoma(365)   . Asthma   . Multiple myeloma, stage 3 08/20/11 dx  . Anemia     PAST SURGICAL HISTORY: Past Surgical History  Procedure Date  . Cholecystectomy 1995  . Tonsillectomy and adenoidectomy   . Carpal tunnel release     FAMILY HISTORY Family History  Problem Relation Age of Onset  . Heart disease Father   . Breast cancer Mother   . Heart disease Mother   . Diabetes Paternal Grandfather   . Hypertension Maternal Grandmother   . Hypertension Brother   The patient's parents are in their mid-70s.Her mother has a remote history of breast cancer. She has two brothers, one her twin; no sisters. There is no other history of cancer in the family to her knowledge  GYNECOLOGIC HISTORY: Menarche age 18, first live birth age 83, GX P39. Last period was January 2013, before that May 2012.  SOCIAL HISTORY: She is a Software engineer and taught kindergarten until 2012. Her husband of  31 years, [name], graduated from [employer] and works in Regulatory affairs officer. Daughter [name] works for [employer] and as noted was married June 1st. Daughter [name] studies business at [employer]. The patient attends the [employer]   ADVANCED DIRECTIVES:  HEALTH MAINTENANCE: History  Substance Use Topics  . Smoking status: Never Smoker   . Smokeless tobacco: Never Used  . Alcohol Use: No     Colonoscopy: 2009/[facility]  PAP: UTD  Bone  density: never  Lipid panel: Swayne/ "excellent"  Allergies  Allergen Reactions  . Wellbutrin (Bupropion Hcl) Hives    Current Outpatient Prescriptions  Medication Sig Dispense Refill  . omeprazole (PRILOSEC) 20 MG capsule Take 1 capsule (20 mg total) by mouth daily.  30 capsule  12  . sulfamethoxazole-trimethoprim (BACTRIM DS,SEPTRA DS) 800-160 MG per tablet Take 1 tablet by mouth 2 (two) times a week.  60 tablet  12  . valACYclovir (VALTREX) 500 MG tablet Take 1 tablet (500 mg total) by mouth daily.  30 tablet  12    OBJECTIVE: midddle aged White woman in no acute distress Filed Vitals:   04/01/12 1042  BP: 135/83  Pulse: 86  Temp: 98.8 F (37.1 C)  Resp: 20     Body mass index is 28.22 kg/(m^2).    ECOG FS: 1  Filed Weights   04/01/12 1042  Weight: 156 lb 12.8 oz (71.124 kg)   Sclerae unicteric, PEER, face slightly flushed Oropharynx clear No cervical or supraclavicular adenopathy Lungs no rales or rhonchi Heart regular rate and rhythm, rare premature beats Abd soft, positive bowel sounds, no right upper quadrant tenderness MSK no focal spinal tenderness, no peripheral edema Neuro: nonfocal, alert and oriented x3, positive affect Breasts: Deferred   LAB RESULTS: Lab Results  Component Value Date   WBC 9.4 03/25/2012   NEUTROABS 7.5* 03/25/2012   HGB 13.6 03/25/2012   HCT 41.1 03/25/2012   MCV 91.2 03/25/2012   PLT 172 03/25/2012      Chemistry      Component Value Date/Time   NA 143 03/25/2012 1136   NA 143 12/01/2011 0836   K 3.9 03/25/2012 1136   K 4.0 12/01/2011 0836   CL 106 03/25/2012 1136   CL 105 12/01/2011 0836   CO2 28 03/25/2012 1136   CO2 29 12/01/2011 0836   BUN 10.0 03/25/2012 1136   BUN 12 12/01/2011 0836   CREATININE 0.7 03/25/2012 1136   CREATININE 0.52 12/01/2011 0836      Component Value Date/Time   CALCIUM 8.9 03/25/2012 1136   CALCIUM 8.9 12/01/2011 0836   ALKPHOS 73 03/25/2012 1136   ALKPHOS 110 12/01/2011 0836   AST 25  03/25/2012 1136   AST 15 12/01/2011 0836   ALT 131* 03/25/2012 1136   ALT 16 12/01/2011 0836   BILITOT 0.34 03/25/2012 1136   BILITOT 0.3 12/01/2011 0836       Ref. Range 03/25/2012 11:36  Albumin ELP Latest Range: 55.8-66.1 % 60.3  COMMENT (PROTEIN ELECTROPHOR) No range found *  Alpha-1-Globulin Latest Range: 2.9-4.9 % 4.6  Alpha-2-Globulin Latest Range: 7.1-11.8 % 12.4 (H)  Beta Globulin Latest Range: 4.7-7.2 % 6.4  Beta 2 Latest Range: 3.2-6.5 % 5.2  Gamma Globulin Latest Range: 11.1-18.8 % 11.1  M-SPIKE, % No range found NOT DET  SPE Interp. No range found *  Total Protein, serum electrophor Latest Range: 6.0-8.3 g/dL 6.1  Kappa free light chain Latest Range: 0.33-1.94 mg/dL 1.61  Lambda Free Lght Chn Latest Range: 0.57-2.63 mg/dL  0.91  Kappa:Lambda Ratio Latest Range: 0.26-1.65  0.79    ASSESSMENT: 56 y.o.  [place] woman presenting with anemia, found to have an IgA >6g, with an M-spike of 4.5 g (and a second M-spike of 0.4g), urine IFE showing IgA-lambda and lambda light chains, creatinine 0.77, calcium 10.0, albumin 3.1, and beta-2-microglobulin 5.51; with bone survey showing multiple myeloma lesions and bone marrow biopsy 09/01/2011 showing 75% myeloma cells. 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  (2) on prophylactic coumadin, discontinued as of 12/13/2011.  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  PLAN: She did terrific with treatment and currently he is in complete remission. She is  going to be back in [place] a generous 16th. She will see me again February 13, and we will do lab work a couple days before that. Likely we will continue to "tacking" her at increasing intervals over the next year. She is on a prednisone taper which will and December 23. At this time I am delighted that she tolerated her aggressive treatment so well. She knows to call for any problems that may develop before the next visit.  Jeniyah Menor C    04/01/2012

## 2012-04-30 ENCOUNTER — Encounter: Payer: Self-pay | Admitting: *Deleted

## 2012-04-30 NOTE — Progress Notes (Signed)
RECEIVED A FAX FROM [facility] CONCERNING A PRIOR AUTHORIZATION FOR OMEPRAZOLE. THIS REQUEST WAS GIVEN TO ELIZABETH SUTTON IN MANAGED CARE.

## 2012-05-03 ENCOUNTER — Encounter: Payer: Self-pay | Admitting: Oncology

## 2012-05-03 NOTE — Progress Notes (Addendum)
Called [employer] [phone] and spoke to Raynelle Fanning the omeprozole has been approved from 04/14/12 to no expiration date. Case [id number] I called Karin Golden @ [facility] [phone]. I will call the patient. Spoke to [place].

## 2012-05-25 ENCOUNTER — Other Ambulatory Visit: Payer: BC Managed Care – PPO

## 2012-05-27 ENCOUNTER — Ambulatory Visit: Payer: BC Managed Care – PPO | Admitting: Oncology

## 2012-05-29 ENCOUNTER — Other Ambulatory Visit: Payer: Self-pay

## 2012-06-01 ENCOUNTER — Other Ambulatory Visit (HOSPITAL_BASED_OUTPATIENT_CLINIC_OR_DEPARTMENT_OTHER): Payer: BC Managed Care – PPO | Admitting: Lab

## 2012-06-01 DIAGNOSIS — C9000 Multiple myeloma not having achieved remission: Secondary | ICD-10-CM

## 2012-06-01 LAB — CBC WITH DIFFERENTIAL/PLATELET
BASO%: 0.6 % (ref 0.0–2.0)
EOS%: 2 % (ref 0.0–7.0)
Eosinophils Absolute: 0.1 10*3/uL (ref 0.0–0.5)
LYMPH%: 29.1 % (ref 14.0–49.7)
MCHC: 33.5 g/dL (ref 31.5–36.0)
MCV: 88.9 fL (ref 79.5–101.0)
MONO%: 8.2 % (ref 0.0–14.0)
NEUT#: 2.8 10*3/uL (ref 1.5–6.5)
Platelets: 197 10*3/uL (ref 145–400)
RBC: 5.09 10*6/uL (ref 3.70–5.45)
RDW: 14.5 % (ref 11.2–14.5)

## 2012-06-01 LAB — COMPREHENSIVE METABOLIC PANEL (CC13)
ALT: 26 U/L (ref 0–55)
AST: 17 U/L (ref 5–34)
Albumin: 3.8 g/dL (ref 3.5–5.0)
Alkaline Phosphatase: 68 U/L (ref 40–150)
Glucose: 87 mg/dl (ref 70–99)
Potassium: 3.1 mEq/L — ABNORMAL LOW (ref 3.5–5.1)
Sodium: 145 mEq/L (ref 136–145)
Total Bilirubin: 0.4 mg/dL (ref 0.20–1.20)
Total Protein: 7.1 g/dL (ref 6.4–8.3)

## 2012-06-01 LAB — LACTATE DEHYDROGENASE (CC13): LDH: 175 U/L (ref 125–245)

## 2012-06-02 LAB — KAPPA/LAMBDA LIGHT CHAINS: Kappa:Lambda Ratio: 0.81 (ref 0.26–1.65)

## 2012-06-03 ENCOUNTER — Ambulatory Visit (HOSPITAL_BASED_OUTPATIENT_CLINIC_OR_DEPARTMENT_OTHER): Payer: BC Managed Care – PPO | Admitting: Oncology

## 2012-06-03 ENCOUNTER — Telehealth: Payer: Self-pay | Admitting: Oncology

## 2012-06-03 VITALS — BP 158/97 | HR 87 | Temp 97.7°F | Resp 20 | Ht 62.5 in | Wt 160.5 lb

## 2012-06-03 DIAGNOSIS — C9000 Multiple myeloma not having achieved remission: Secondary | ICD-10-CM

## 2012-06-03 LAB — SPEP & IFE WITH QIG
Alpha-1-Globulin: 3.8 % (ref 2.9–4.9)
Gamma Globulin: 9.8 % — ABNORMAL LOW (ref 11.1–18.8)
IgM, Serum: 20 mg/dL — ABNORMAL LOW (ref 52–322)
Total Protein, Serum Electrophoresis: 7 g/dL (ref 6.0–8.3)

## 2012-06-03 NOTE — Telephone Encounter (Signed)
gv pt appt schedule for February thru July. No availability w/GM on 4/21. email to [facility] re d/t for f/u with him. Pt aware and can get f/u when GM when she see AB 3/24.

## 2012-06-03 NOTE — Progress Notes (Signed)
ID: Laura Hester   DOB: Oct 04, 1955  MR#: [id number]  HYQ#:[id number]  PCP: Sissy Hoff, MD GYN: SU: OTHER MD: Armandina Stammer  HISTORY OF PRESENT ILLNESS: [name] has a history of iron deficiency anemia secondary to menorrhagia. However, as this persisted despite near-total cessation of her menses and despite iron supplementation, she was referred to Dr. Dorena Cookey for GI evaluation. He found the patient's Hb to have decreased from 9.17 June 2011 to 8.3 07/29/2011. MCV was 95.9, ferritin 93.3, with a normal folate and negative ANA. Guaiacs were negative. Creatinine was 0.77.  t-transglutaminase IgA was normal at 2, but the total IgA was 6.9 g. Accordingly on 08/04/2011 an SPEP was obtained, showing an M-spike of 4.5 g, with a secondary M-spike of 0.4 g. Total protein was 10.0 with albumin 3.2, calcium 10.2. Bone survey 08/26/2011 showed multiple bone lesions consistent with myeloma and bone marrow biopsy 09/01/2011 confirmed the diagnosis with 75% myeloma cells in the marrow. Beverly' subsequent history is detailed below.  INTERVAL HISTORY: Laura Hester comes today with a friend for followup of her multiple myeloma. Today is day 100 from her transplant.  REVIEW OF SYSTEMS: She is very excited that her hair is growing back. She has mild back or joint pain at times, but this is not very persistent or intense. She has had no intercurrent fevers, rash, bleeding, or unusual fatigue or weight loss. She doesn't have any neuropathy symptoms that she is aware of. A detailed review of systems today was otherwise noncontributory  PAST MEDICAL HISTORY: Past Medical History  Diagnosis Date  . Fibrocystic breast disease   . Allergic rhinitis   . HTN (hypertension)   . Prediabetes   . SUI (stress urinary incontinence, female)   . Obesity   . Glaucoma(365)   . Asthma   . Multiple myeloma, stage 3 08/20/11 dx  . Anemia     PAST SURGICAL HISTORY: Past Surgical History  Procedure Laterality Date  .  Cholecystectomy  1995  . Tonsillectomy and adenoidectomy    . Carpal tunnel release      FAMILY HISTORY Family History  Problem Relation Age of Onset  . Heart disease Father   . Breast cancer Mother   . Heart disease Mother   . Diabetes Paternal Grandfather   . Hypertension Maternal Grandmother   . Hypertension Brother   The patient's parents are in their mid-70s.Her mother has a remote history of breast cancer. She has two brothers, one her twin; no sisters. There is no other history of cancer in the family to her knowledge  GYNECOLOGIC HISTORY: Menarche age 32, first live birth age 6, GX P77. Last period was January 2013, before that May 2012.  SOCIAL HISTORY: She is a Software engineer and taught kindergarten until 2012. Her husband of 31 years, [name], graduated from [employer] and works in Regulatory affairs officer. Daughter Laura Hester works for [employer] and as noted was married June 1st. Daughter Laura Hester studies business at [employer]. The patient attends the [employer]   ADVANCED DIRECTIVES: Not in place  HEALTH MAINTENANCE: History  Substance Use Topics  . Smoking status: Never Smoker   . Smokeless tobacco: Never Used  . Alcohol Use: No     Colonoscopy: 2009/[facility]  PAP: UTD  Bone density: never  Lipid panel: [place]/ "excellent"  Allergies  Allergen Reactions  . Wellbutrin (Bupropion Hcl) Hives    Current Outpatient Prescriptions  Medication Sig Dispense Refill  . omeprazole (PRILOSEC) 20 MG capsule Take 1 capsule (20  mg total) by mouth daily.  30 capsule  12  . sulfamethoxazole-trimethoprim (BACTRIM DS,SEPTRA DS) 800-160 MG per tablet Take 1 tablet by mouth 2 (two) times a week.  60 tablet  12  . valACYclovir (VALTREX) 500 MG tablet Take 1 tablet (500 mg total) by mouth daily.  30 tablet  12   No current facility-administered medications for this visit.    OBJECTIVE: midddle aged White woman who appears well Filed Vitals:   06/03/12 0957  BP: 158/97   Pulse: 87  Temp: 97.7 F (36.5 C)  Resp: 20     Body mass index is 28.87 kg/(m^2).    ECOG FS: 1  Filed Weights   06/03/12 0957  Weight: 160 lb 8 oz (72.802 kg)   Sclerae unicteric Oropharynx clear No cervical or supraclavicular adenopathy Lungs no rales or rhonchi Heart regular rate and rhythm, no murmur appreciated Abd soft, positive bowel sounds, nontender  MSK no focal spinal tenderness, no peripheral edema Neuro: nonfocal, well oriented, positive affect Breasts: Deferred   LAB RESULTS: Lab Results  Component Value Date   WBC 4.7 06/01/2012   NEUTROABS 2.8 06/01/2012   HGB 15.2 06/01/2012   HCT 45.3 06/01/2012   MCV 88.9 06/01/2012   PLT 197 06/01/2012      Chemistry      Component Value Date/Time   NA 145 06/01/2012 0934   NA 143 12/01/2011 0836   K 3.1* 06/01/2012 0934   K 4.0 12/01/2011 0836   CL 107 06/01/2012 0934   CL 105 12/01/2011 0836   CO2 26 06/01/2012 0934   CO2 29 12/01/2011 0836   BUN 12.0 06/01/2012 0934   BUN 12 12/01/2011 0836   CREATININE 0.7 06/01/2012 0934   CREATININE 0.52 12/01/2011 0836      Component Value Date/Time   CALCIUM 9.7 06/01/2012 0934   CALCIUM 8.9 12/01/2011 0836   ALKPHOS 68 06/01/2012 0934   ALKPHOS 110 12/01/2011 0836   AST 17 06/01/2012 0934   AST 15 12/01/2011 0836   ALT 26 06/01/2012 0934   ALT 16 12/01/2011 0836   BILITOT 0.40 06/01/2012 0934   BILITOT 0.3 12/01/2011 0836     Results for Laura Hester (MRN [id number]) as of 06/06/2012 17:58  Ref. Range 06/01/2012 09:34  Kappa free light chain Latest Range: 0.33-1.94 mg/dL 2.72  Lambda Free Lght Chn Latest Range: 0.57-2.63 mg/dL 5.36  Kappa:Lambda Ratio Latest Range: 0.26-1.65  0.81  Results for ROCSI, Hester (MRN [id number]) as of 06/06/2012 17:58  Ref. Range 06/01/2012 09:34  Albumin ELP Latest Range: 55.8-66.1 % 65.1  COMMENT (PROTEIN ELECTROPHOR) No range found *  Alpha-1-Globulin Latest Range: 2.9-4.9 % 3.8  Alpha-2-Globulin Latest Range: 7.1-11.8 % 10.9  Beta  Globulin Latest Range: 4.7-7.2 % 6.3  Beta 2 Latest Range: 3.2-6.5 % 4.1  Gamma Globulin Latest Range: 11.1-18.8 % 9.8 (L)  M-SPIKE, % No range found NOT DET  SPE Interp. No range found *  IgG (Immunoglobin G), Serum Latest Range: [phone] mg/dL 595  IgA Latest Range: 69-380 mg/dL 638  IgM, Serum Latest Range: 52-322 mg/dL 20 (L)  Total Protein, serum electrophor Latest Range: 6.0-8.3 g/dL 7.0    ASSESSMENT: 57 y.o.  [place] woman presenting with anemia, found to have an IgA >6g, with an M-spike of 4.5 g (and a second M-spike of 0.4g), urine IFE showing IgA-lambda and lambda light chains, creatinine 0.77, calcium 10.0, albumin 3.1, and beta-2-microglobulin 5.51; with bone survey showing multiple myeloma lesions and bone marrow biopsy  09/01/2011 showing 75% myeloma cells. 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  (2) on prophylactic coumadin, discontinued as of 12/13/2011.  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  PLAN: She has made an excellent recovery and remains in remission. At this point we are ready to start maintenance therapy, and the plan is for bortezomib 1.3 mg subcutaneous every 2 weeks. We will also resume zoledronic acid. She is going to be starting on 06/07/2012.  She is a ready on a cycle the her and Bactrim (Mondays and Thursdays). We will check her labs on a monthly basis. She really has a return visit in May to [facility]. She will see [place] again at the start of her second month of therapy. She  knows to call for any problems that may develop before then.  MAGRINAT,GUSTAV C    06/03/2012

## 2012-06-07 ENCOUNTER — Other Ambulatory Visit (HOSPITAL_BASED_OUTPATIENT_CLINIC_OR_DEPARTMENT_OTHER): Payer: BC Managed Care – PPO | Admitting: Lab

## 2012-06-07 ENCOUNTER — Ambulatory Visit (HOSPITAL_BASED_OUTPATIENT_CLINIC_OR_DEPARTMENT_OTHER): Payer: BC Managed Care – PPO

## 2012-06-07 VITALS — BP 145/70 | HR 83 | Temp 98.2°F

## 2012-06-07 DIAGNOSIS — C9000 Multiple myeloma not having achieved remission: Secondary | ICD-10-CM

## 2012-06-07 DIAGNOSIS — Z5112 Encounter for antineoplastic immunotherapy: Secondary | ICD-10-CM

## 2012-06-07 LAB — CBC WITH DIFFERENTIAL/PLATELET
Basophils Absolute: 0 10*3/uL (ref 0.0–0.1)
EOS%: 1.9 % (ref 0.0–7.0)
HCT: 43.1 % (ref 34.8–46.6)
LYMPH%: 20.1 % (ref 14.0–49.7)
MONO%: 6.7 % (ref 0.0–14.0)
NEUT#: 4 10*3/uL (ref 1.5–6.5)
NEUT%: 70.7 % (ref 38.4–76.8)
RDW: 14.1 % (ref 11.2–14.5)
WBC: 5.6 10*3/uL (ref 3.9–10.3)

## 2012-06-07 MED ORDER — BORTEZOMIB CHEMO SQ INJECTION 3.5 MG (2.5MG/ML)
1.3000 mg/m2 | Freq: Once | INTRAMUSCULAR | Status: AC
  Administered 2012-06-07: 2.25 mg via SUBCUTANEOUS
  Filled 2012-06-07: qty 2.25

## 2012-06-07 MED ORDER — ONDANSETRON HCL 8 MG PO TABS
8.0000 mg | ORAL_TABLET | Freq: Once | ORAL | Status: AC
  Administered 2012-06-07: 8 mg via ORAL

## 2012-06-07 NOTE — Patient Instructions (Addendum)
[facility] Discharge Instructions for Patients Receiving Chemotherapy  Today you received the following chemotherapy agent Velcade  To help prevent nausea and vomiting after your treatment, we encourage you to take your nausea medication. Begin taking your nausea medication as often as prescribed for by Dr. Darnelle Catalan.    If you develop nausea and vomiting that is not controlled by your nausea medication, call the clinic. If it is after clinic hours your family physician or the after hours number for the clinic or go to the Emergency Department.   BELOW ARE SYMPTOMS THAT SHOULD BE REPORTED IMMEDIATELY:  *FEVER GREATER THAN 100.5 F  *CHILLS WITH OR WITHOUT FEVER  NAUSEA AND VOMITING THAT IS NOT CONTROLLED WITH YOUR NAUSEA MEDICATION  *UNUSUAL SHORTNESS OF BREATH  *UNUSUAL BRUISING OR BLEEDING  TENDERNESS IN MOUTH AND THROAT WITH OR WITHOUT PRESENCE OF ULCERS  *URINARY PROBLEMS  *BOWEL PROBLEMS  UNUSUAL RASH Items with * indicate a potential emergency and should be followed up as soon as possible.  One of the nurses will contact you 24 hours after your treatment. Please let the nurse know about any problems that you may have experienced. Feel free to call the clinic you have any questions or concerns. The clinic phone number is [phone].   I have been informed and understand all the instructions given to me. I know to contact the clinic, my physician, or go to the Emergency Department if any problems should occur. I do not have any questions at this time, but understand that I may call the clinic during office hours   should I have any questions or need assistance in obtaining follow up care.    __________________________________________  _____________  __________ Signature of Patient or Authorized Representative            Date                   Time    __________________________________________ Nurse's Signature   Bortezomib injection  BORTEZOMIB  (bor TEZ oh mib) is a chemotherapy drug. It slows the growth of cancer cells. This medicine is used to treat multiple myeloma, lymphoma, and other cancers. This medicine may be used for other purposes; ask your health care provider or pharmacist if you have questions. What should I tell my health care provider before I take this medicine? They need to know if you have any of these conditions: -heart disease -irregular heartbeat -liver disease -low blood counts, like low white blood cells, platelets, or hemoglobin -peripheral neuropathy -taking medicine for blood pressure -an unusual or allergic reaction to bortezomib, mannitol, boron, other medicines, foods, dyes, or preservatives -pregnant or trying to get pregnant -breast-feeding How should I use this medicine? This medicine is for injection into a vein or for injection under the skin. It is given by a health care professional in a hospital or clinic setting. Talk to your pediatrician regarding the use of this medicine in children. Special care may be needed. Overdosage: If you think you have taken too much of this medicine contact a poison control center or emergency room at once. NOTE: This medicine is only for you. Do not share this medicine with others. What if I miss a dose? It is important not to miss your dose. Call your doctor or health care professional if you are unable to keep an appointment. What may interact with this medicine? -medicines for diabetes -medicines to increase blood counts like filgrastim, pegfilgrastim, sargramostim -zalcitabine Talk to your doctor  or health care professional before taking any of these medicines: -acetaminophen -aspirin -ibuprofen -ketoprofen -naproxen This list may not describe all possible interactions. Give your health care provider a list of all the medicines, herbs, non-prescription drugs, or dietary supplements you use. Also tell them if you smoke, drink alcohol, or use illegal drugs.  Some items may interact with your medicine. What should I watch for while using this medicine? Visit your doctor for checks on your progress. This drug may make you feel generally unwell. This is not uncommon, as chemotherapy can affect healthy cells as well as cancer cells. Report any side effects. Continue your course of treatment even though you feel ill unless your doctor tells you to stop. You may get drowsy or dizzy. Do not drive, use machinery, or do anything that needs mental alertness until you know how this medicine affects you. Do not stand or sit up quickly, especially if you are an older patient. This reduces the risk of dizzy or fainting spells. In some cases, you may be given additional medicines to help with side effects. Follow all directions for their use. Call your doctor or health care professional for advice if you get a fever, chills or sore throat, or other symptoms of a cold or flu. Do not treat yourself. This drug decreases your body's ability to fight infections. Try to avoid being around people who are sick. This medicine may increase your risk to bruise or bleed. Call your doctor or health care professional if you notice any unusual bleeding. Be careful brushing and flossing your teeth or using a toothpick because you may get an infection or bleed more easily. If you have any dental work done, tell your dentist you are receiving this medicine. Avoid taking products that contain aspirin, acetaminophen, ibuprofen, naproxen, or ketoprofen unless instructed by your doctor. These medicines may hide a fever. Do not become pregnant while taking this medicine. Women should inform their doctor if they wish to become pregnant or think they might be pregnant. There is a potential for serious side effects to an unborn child. Talk to your health care professional or pharmacist for more information. Do not breast-feed an infant while taking this medicine. You may have vomiting or diarrhea  while taking this medicine. Drink water or other fluids as directed. What side effects may I notice from receiving this medicine? Side effects that you should report to your doctor or health care professional as soon as possible: -allergic reactions like skin rash, itching or hives, swelling of the face, lips, or tongue -breathing problems -changes in hearing -changes in vision -fast, irregular heartbeat -feeling faint or lightheaded, falls -pain, tingling, numbness in the hands or feet -seizures -swelling of the ankles, feet, hands -unusual bleeding or bruising -unusually weak or tired -vomiting Side effects that usually do not require medical attention (report to your doctor or health care professional if they continue or are bothersome): -changes in emotions or moods -constipation -diarrhea -loss of appetite -headache -irritation at site where injected -nausea This list may not describe all possible side effects. Call your doctor for medical advice about side effects. You may report side effects to FDA at 1-800-FDA-1088. Where should I keep my medicine? This drug is given in a hospital or clinic and will not be stored at home. NOTE: This sheet is a summary. It may not cover all possible information. If you have questions about this medicine, talk to your doctor, pharmacist, or health care provider.  2012, [employer]. (  05/08/2010 11:42:36 AM)

## 2012-06-10 ENCOUNTER — Telehealth: Payer: Self-pay | Admitting: Oncology

## 2012-06-10 NOTE — Telephone Encounter (Signed)
Added f/u w/GM for 4/21 @ 8:30 am. Pt was already aware that this appt was to be added and will get new schedule at 3/24 appt.

## 2012-06-15 ENCOUNTER — Other Ambulatory Visit: Payer: Self-pay | Admitting: Physician Assistant

## 2012-06-16 ENCOUNTER — Telehealth: Payer: Self-pay | Admitting: Oncology

## 2012-06-16 NOTE — Telephone Encounter (Signed)
Moved 3/24 appt from AB to GM per AB - poof. lmonvm for pt re change and new time for 8:30am. Pt to get schedule when she comes in 3/10.

## 2012-06-21 ENCOUNTER — Other Ambulatory Visit: Payer: Self-pay | Admitting: Oncology

## 2012-06-21 ENCOUNTER — Ambulatory Visit (HOSPITAL_BASED_OUTPATIENT_CLINIC_OR_DEPARTMENT_OTHER): Payer: BC Managed Care – PPO

## 2012-06-21 ENCOUNTER — Other Ambulatory Visit (HOSPITAL_BASED_OUTPATIENT_CLINIC_OR_DEPARTMENT_OTHER): Payer: BC Managed Care – PPO | Admitting: Lab

## 2012-06-21 VITALS — BP 142/77 | HR 88 | Temp 97.8°F

## 2012-06-21 DIAGNOSIS — C9000 Multiple myeloma not having achieved remission: Secondary | ICD-10-CM

## 2012-06-21 DIAGNOSIS — Z5112 Encounter for antineoplastic immunotherapy: Secondary | ICD-10-CM

## 2012-06-21 LAB — COMPREHENSIVE METABOLIC PANEL (CC13)
CO2: 27 mEq/L (ref 22–29)
Calcium: 9.4 mg/dL (ref 8.4–10.4)
Chloride: 108 mEq/L — ABNORMAL HIGH (ref 98–107)
Glucose: 86 mg/dl (ref 70–99)
Sodium: 146 mEq/L — ABNORMAL HIGH (ref 136–145)
Total Bilirubin: 0.22 mg/dL (ref 0.20–1.20)
Total Protein: 6.6 g/dL (ref 6.4–8.3)

## 2012-06-21 LAB — CBC WITH DIFFERENTIAL/PLATELET
EOS%: 2.6 % (ref 0.0–7.0)
MCH: 29.7 pg (ref 25.1–34.0)
MCV: 87.7 fL (ref 79.5–101.0)
MONO%: 9 % (ref 0.0–14.0)
RBC: 4.89 10*6/uL (ref 3.70–5.45)
RDW: 13.6 % (ref 11.2–14.5)

## 2012-06-21 MED ORDER — ONDANSETRON HCL 8 MG PO TABS: 8.0000 mg | ORAL_TABLET | Freq: Once | ORAL | Status: DC

## 2012-06-21 MED ORDER — BORTEZOMIB CHEMO SQ INJECTION 3.5 MG (2.5MG/ML)
1.3000 mg/m2 | Freq: Once | INTRAMUSCULAR | Status: AC
  Administered 2012-06-21: 2.25 mg via SUBCUTANEOUS
  Filled 2012-06-21: qty 2.25

## 2012-06-21 NOTE — Patient Instructions (Addendum)
[facility] Discharge Instructions for Patients Receiving Chemotherapy  Today you received the following chemotherapy agents Velcade.  To help prevent nausea and vomiting after your treatment, we encourage you to take your nausea medication.   If you develop nausea and vomiting that is not controlled by your nausea medication, call the clinic. If it is after clinic hours your family physician or the after hours number for the clinic or go to the Emergency Department.   BELOW ARE SYMPTOMS THAT SHOULD BE REPORTED IMMEDIATELY:  *FEVER GREATER THAN 100.5 F  *CHILLS WITH OR WITHOUT FEVER  NAUSEA AND VOMITING THAT IS NOT CONTROLLED WITH YOUR NAUSEA MEDICATION  *UNUSUAL SHORTNESS OF BREATH  *UNUSUAL BRUISING OR BLEEDING  TENDERNESS IN MOUTH AND THROAT WITH OR WITHOUT PRESENCE OF ULCERS  *URINARY PROBLEMS  *BOWEL PROBLEMS  UNUSUAL RASH Items with * indicate a potential emergency and should be followed up as soon as possible.  Feel free to call the clinic you have any questions or concerns. The clinic phone number is [phone].   

## 2012-06-22 LAB — PROTEIN / CREATININE RATIO, URINE

## 2012-06-23 ENCOUNTER — Other Ambulatory Visit: Payer: Self-pay | Admitting: Certified Registered Nurse Anesthetist

## 2012-06-23 LAB — PROTEIN ELECTROPHORESIS, SERUM
Albumin ELP: 63.3 % (ref 55.8–66.1)
Alpha-1-Globulin: 4.3 % (ref 2.9–4.9)
Alpha-2-Globulin: 12.2 % — ABNORMAL HIGH (ref 7.1–11.8)
Beta 2: 3.9 % (ref 3.2–6.5)
Gamma Globulin: 9.8 % — ABNORMAL LOW (ref 11.1–18.8)

## 2012-06-23 LAB — KAPPA/LAMBDA LIGHT CHAINS
Kappa free light chain: 0.81 mg/dL (ref 0.33–1.94)
Lambda Free Lght Chn: 0.97 mg/dL (ref 0.57–2.63)

## 2012-07-05 ENCOUNTER — Ambulatory Visit (HOSPITAL_BASED_OUTPATIENT_CLINIC_OR_DEPARTMENT_OTHER): Payer: BC Managed Care – PPO | Admitting: Oncology

## 2012-07-05 ENCOUNTER — Other Ambulatory Visit (HOSPITAL_BASED_OUTPATIENT_CLINIC_OR_DEPARTMENT_OTHER): Payer: BC Managed Care – PPO | Admitting: Lab

## 2012-07-05 ENCOUNTER — Ambulatory Visit (HOSPITAL_BASED_OUTPATIENT_CLINIC_OR_DEPARTMENT_OTHER): Payer: BC Managed Care – PPO

## 2012-07-05 VITALS — BP 153/92 | HR 85 | Temp 98.2°F | Resp 20 | Ht 62.5 in | Wt 164.2 lb

## 2012-07-05 DIAGNOSIS — Z5112 Encounter for antineoplastic immunotherapy: Secondary | ICD-10-CM

## 2012-07-05 DIAGNOSIS — C9000 Multiple myeloma not having achieved remission: Secondary | ICD-10-CM

## 2012-07-05 DIAGNOSIS — C9001 Multiple myeloma in remission: Secondary | ICD-10-CM

## 2012-07-05 LAB — CBC WITH DIFFERENTIAL/PLATELET
BASO%: 0.8 % (ref 0.0–2.0)
Eosinophils Absolute: 0.1 10*3/uL (ref 0.0–0.5)
LYMPH%: 30.3 % (ref 14.0–49.7)
MCHC: 32.8 g/dL (ref 31.5–36.0)
MONO#: 0.4 10*3/uL (ref 0.1–0.9)
NEUT#: 2.3 10*3/uL (ref 1.5–6.5)
Platelets: 187 10*3/uL (ref 145–400)
RBC: 4.86 10*6/uL (ref 3.70–5.45)
RDW: 13.8 % (ref 11.2–14.5)
WBC: 4.1 10*3/uL (ref 3.9–10.3)
lymph#: 1.2 10*3/uL (ref 0.9–3.3)

## 2012-07-05 MED ORDER — ONDANSETRON HCL 8 MG PO TABS
8.0000 mg | ORAL_TABLET | Freq: Once | ORAL | Status: AC
  Administered 2012-07-05: 8 mg via ORAL

## 2012-07-05 MED ORDER — BORTEZOMIB CHEMO SQ INJECTION 3.5 MG (2.5MG/ML)
1.3000 mg/m2 | Freq: Once | INTRAMUSCULAR | Status: AC
  Administered 2012-07-05: 2.25 mg via SUBCUTANEOUS
  Filled 2012-07-05: qty 2.25

## 2012-07-05 NOTE — Progress Notes (Signed)
ID: [name]   DOB: 22-Jan-1956  MR#: [id number]  NFA#:[id number]  PCP: Sissy Hoff, MD GYN: Genia Del SU: OTHER MD: Armandina Stammer  HISTORY OF PRESENT ILLNESS: Laura Hester has a history of iron deficiency anemia secondary to menorrhagia. However, as this persisted despite near-total cessation of her menses and despite iron supplementation, she was referred to Dr. Dorena Cookey for GI evaluation. He found the patient's Hb to have decreased from 9.17 June 2011 to 8.3 07/29/2011. MCV was 95.9, ferritin 93.3, with a normal folate and negative ANA. Guaiacs were negative. Creatinine was 0.77.  t-transglutaminase IgA was normal at 2, but the total IgA was 6.9 g. Accordingly on 08/04/2011 an SPEP was obtained, showing an M-spike of 4.5 g, with a secondary M-spike of 0.4 g. Total protein was 10.0 with albumin 3.2, calcium 10.2. Bone survey 08/26/2011 showed multiple bone lesions consistent with myeloma and bone marrow biopsy 09/01/2011 confirmed the diagnosis with 75% myeloma cells in the marrow. Laura Hester' subsequent history is detailed below.  INTERVAL HISTORY: Laura Hester comes today with her husband [name] for followup of her multiple myeloma. Today is day 132 from her transplant at [facility].  REVIEW OF SYSTEMS: She is exercising regularly, doing water aerobics 2 days a week, and senior sneakers a different 2 days. She is concerned about her weight and is trying to diet, but at the same time she is very careful to drink at least 2 quarts of fluid a day, which of course is very favorable. Her eyes continue to get better, and she is going to wait a couple more months before she goes back and gets them remeasured. She is tolerating her bortezomib with no peripheral neuropathy or other symptoms that she is aware of. A detailed review of systems is otherwise noncontributory.  PAST MEDICAL HISTORY: Past Medical History  Diagnosis Date  . Fibrocystic breast disease   . Allergic rhinitis   . HTN  (hypertension)   . Prediabetes   . SUI (stress urinary incontinence, female)   . Obesity   . Glaucoma(365)   . Asthma   . Multiple myeloma, stage 3 08/20/11 dx  . Anemia     PAST SURGICAL HISTORY: Past Surgical History  Procedure Laterality Date  . Cholecystectomy  1995  . Tonsillectomy and adenoidectomy    . Carpal tunnel release      FAMILY HISTORY Family History  Problem Relation Age of Onset  . Heart disease Father   . Breast cancer Mother   . Heart disease Mother   . Diabetes Paternal Grandfather   . Hypertension Maternal Grandmother   . Hypertension Brother   The patient's parents are in their mid-70s.Her mother has a remote history of breast cancer. She has two brothers, one her twin; no sisters. There is no other history of cancer in the family to her knowledge  GYNECOLOGIC HISTORY: Menarche age 48, first live birth age 34, GX P27. Last period was January 2013, before that May 2012.  SOCIAL HISTORY: She is a Software engineer and taught kindergarten until 2012. Her husband of 31 years, [name], graduated from [employer] and works in Regulatory affairs officer. Daughter Laura Hester works for [employer] and as noted was married June 1st. Daughter Laura Hester studies business at [employer]. The patient attends the [employer] [facility] [employer] [facility]   ADVANCED DIRECTIVES: Not in place  HEALTH MAINTENANCE: History  Substance Use Topics  . Smoking status: Never Smoker   . Smokeless tobacco: Never Used  . Alcohol Use: No  Colonoscopy: 2009/[facility]  PAP: UTD  Bone density: never  Lipid panel: Swayne/ "excellent"  Allergies  Allergen Reactions  . Wellbutrin (Bupropion Hcl) Hives    Current Outpatient Prescriptions  Medication Sig Dispense Refill  . omeprazole (PRILOSEC) 20 MG capsule Take 1 capsule (20 mg total) by mouth daily.  30 capsule  12  . potassium chloride SA (K-DUR,KLOR-CON) 20 MEQ tablet Take 20 mEq by mouth daily.      Marland Kitchen sulfamethoxazole-trimethoprim (BACTRIM DS,SEPTRA DS)  800-160 MG per tablet Take 1 tablet by mouth 2 (two) times a week.  60 tablet  12  . valACYclovir (VALTREX) 500 MG tablet Take 1 tablet (500 mg total) by mouth daily.  30 tablet  12   No current facility-administered medications for this visit.    OBJECTIVE: midddle aged White woman who appears well Filed Vitals:   07/05/12 0848  BP: 153/92  Pulse: 85  Temp: 98.2 F (36.8 C)  Resp: 20     Body mass index is 29.54 kg/(m^2).    ECOG FS: 1  Filed Weights   07/05/12 0848  Weight: 164 lb 3.2 oz (74.481 kg)   Sclerae unicteric Oropharynx clear No cervical or supraclavicular adenopathy Lungs no rales or rhonchi, good excursion Heart regular rate and rhythm, no murmur appreciated Abd soft, positive bowel sounds, nontender  MSK no focal spinal tenderness, no peripheral edema Neuro: nonfocal, well oriented, positive affect Breasts: Deferred; both axillae are benign   LAB RESULTS: Lab Results  Component Value Date   WBC 4.1 07/05/2012   NEUTROABS 2.3 07/05/2012   HGB 14.0 07/05/2012   HCT 42.7 07/05/2012   MCV 87.9 07/05/2012   PLT 187 07/05/2012      Chemistry      Component Value Date/Time   NA 146* 06/21/2012 0822   NA 143 12/01/2011 0836   K 3.5 06/21/2012 0822   K 4.0 12/01/2011 0836   CL 108* 06/21/2012 0822   CL 105 12/01/2011 0836   CO2 27 06/21/2012 0822   CO2 29 12/01/2011 0836   BUN 12.0 06/21/2012 0822   BUN 12 12/01/2011 0836   CREATININE 0.7 06/21/2012 0822   CREATININE 0.52 12/01/2011 0836      Component Value Date/Time   CALCIUM 9.4 06/21/2012 0822   CALCIUM 8.9 12/01/2011 0836   ALKPHOS 63 06/21/2012 0822   ALKPHOS 110 12/01/2011 0836   AST 14 06/21/2012 0822   AST 15 12/01/2011 0836   ALT 27 06/21/2012 0822   ALT 16 12/01/2011 0836   BILITOT 0.22 06/21/2012 0822   BILITOT 0.3 12/01/2011 0836     0n 06/21/2012 SPEP showed no monoclonal protein, And lambda light chains were both normal, with a normal ratio, and the urine protein/creatinine ratio was less than  0.3.    ASSESSMENT: 57 y.o.  [place] woman presenting with anemia, found to have an IgA >6g, with an M-spike of 4.5 g (and a second M-spike of 0.4g), urine IFE showing IgA-lambda and lambda light chains, creatinine 0.77, calcium 10.0, albumin 3.1, and beta-2-microglobulin 5.51; with bone survey showing multiple myeloma lesions and bone marrow biopsy 09/01/2011 showing 75% myeloma cells. 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  (2) on prophylactic coumadin, discontinued as of 12/13/2011.  (3) zolendronic acid every 28  days, 09/19/2011 to 01/05/2012 (five doses)  (4) s/p neupogen mobilization October 2013, with a collection of 4.26x10^6 CD34 positive cells  (5) status post melphalan at 200 mg per meter squared 02/23/2012, followed by autologous stem cell transplant the following day.  (6) on 06/07/2012 started maintenance bortezomib sq at 1.3 mg/M2 Q2w, with bactrim and acyclovir prophylaxis, the plan being to contuinue this for 2 years as per the HOVON trial  (7) zolendronic acid currently every 4 weeks  PLAN: Sheina continues to be in remission. She is tolerating treatment well, and is very motivated to keep herself well hydrated, exercise, and in general keep her health maintenance up-to-date. She will see [place] again late April, and then she will see Dr. Sheryn Bison again in May. When we see her again in June we will consider decreasing the zoledronic acid to every 2 months.  MAGRINAT,GUSTAV C    07/05/2012

## 2012-07-19 ENCOUNTER — Other Ambulatory Visit (HOSPITAL_BASED_OUTPATIENT_CLINIC_OR_DEPARTMENT_OTHER): Payer: BC Managed Care – PPO | Admitting: Lab

## 2012-07-19 ENCOUNTER — Ambulatory Visit (HOSPITAL_BASED_OUTPATIENT_CLINIC_OR_DEPARTMENT_OTHER): Payer: BC Managed Care – PPO

## 2012-07-19 VITALS — BP 146/78 | HR 94 | Temp 98.3°F | Resp 17

## 2012-07-19 DIAGNOSIS — C9001 Multiple myeloma in remission: Secondary | ICD-10-CM

## 2012-07-19 DIAGNOSIS — C9000 Multiple myeloma not having achieved remission: Secondary | ICD-10-CM

## 2012-07-19 DIAGNOSIS — Z5112 Encounter for antineoplastic immunotherapy: Secondary | ICD-10-CM

## 2012-07-19 LAB — CBC WITH DIFFERENTIAL/PLATELET
Eosinophils Absolute: 0.1 10*3/uL (ref 0.0–0.5)
HCT: 44.8 % (ref 34.8–46.6)
LYMPH%: 25.3 % (ref 14.0–49.7)
MONO#: 0.4 10*3/uL (ref 0.1–0.9)
NEUT#: 3.2 10*3/uL (ref 1.5–6.5)
NEUT%: 64.4 % (ref 38.4–76.8)
Platelets: 215 10*3/uL (ref 145–400)
RBC: 5.03 10*6/uL (ref 3.70–5.45)
WBC: 4.9 10*3/uL (ref 3.9–10.3)

## 2012-07-19 LAB — COMPREHENSIVE METABOLIC PANEL (CC13)
ALT: 18 U/L (ref 0–55)
AST: 16 U/L (ref 5–34)
Alkaline Phosphatase: 68 U/L (ref 40–150)
Chloride: 105 mEq/L (ref 98–107)
Creatinine: 0.7 mg/dL (ref 0.6–1.1)
Total Bilirubin: 0.39 mg/dL (ref 0.20–1.20)

## 2012-07-19 LAB — PROTEIN / CREATININE RATIO, URINE
Creatinine, Urine: 9.7 mg/dL
Total Protein, Urine: <3 mg/dL

## 2012-07-19 MED ORDER — BORTEZOMIB CHEMO SQ INJECTION 3.5 MG (2.5MG/ML)
1.3000 mg/m2 | Freq: Once | INTRAMUSCULAR | Status: AC
  Administered 2012-07-19: 2.25 mg via SUBCUTANEOUS
  Filled 2012-07-19: qty 2.25

## 2012-07-19 MED ORDER — ONDANSETRON HCL 8 MG PO TABS
8.0000 mg | ORAL_TABLET | Freq: Once | ORAL | Status: AC
  Administered 2012-07-19: 8 mg via ORAL

## 2012-07-19 NOTE — Patient Instructions (Addendum)
[facility] Discharge Instructions for Patients Receiving Chemotherapy  Today you received the following chemotherapy agents Velcade.  To help prevent nausea and vomiting after your treatment, we encourage you to take your nausea medication as prescribed.   If you develop nausea and vomiting that is not controlled by your nausea medication, call the clinic. If it is after clinic hours your family physician or the after hours number for the clinic or go to the Emergency Department.   BELOW ARE SYMPTOMS THAT SHOULD BE REPORTED IMMEDIATELY:  *FEVER GREATER THAN 100.5 F  *CHILLS WITH OR WITHOUT FEVER  NAUSEA AND VOMITING THAT IS NOT CONTROLLED WITH YOUR NAUSEA MEDICATION  *UNUSUAL SHORTNESS OF BREATH  *UNUSUAL BRUISING OR BLEEDING  TENDERNESS IN MOUTH AND THROAT WITH OR WITHOUT PRESENCE OF ULCERS  *URINARY PROBLEMS  *BOWEL PROBLEMS  UNUSUAL RASH Items with * indicate a potential emergency and should be followed up as soon as possible.  Feel free to call the clinic you have any questions or concerns. The clinic phone number is [phone].   I have been informed and understand all the instructions given to me. I know to contact the clinic, my physician, or go to the Emergency Department if any problems should occur. I do not have any questions at this time, but understand that I may call the clinic during office hours   should I have any questions or need assistance in obtaining follow up care.    __________________________________________  _____________  __________ Signature of Patient or Authorized Representative            Date                   Time    __________________________________________ Nurse's Signature    

## 2012-07-21 LAB — KAPPA/LAMBDA LIGHT CHAINS: Kappa free light chain: 1.02 mg/dL (ref 0.33–1.94)

## 2012-07-21 LAB — PROTEIN ELECTROPHORESIS, SERUM
Albumin ELP: 61.3 % (ref 55.8–66.1)
Beta 2: 4.6 % (ref 3.2–6.5)

## 2012-08-02 ENCOUNTER — Ambulatory Visit (HOSPITAL_BASED_OUTPATIENT_CLINIC_OR_DEPARTMENT_OTHER): Payer: BC Managed Care – PPO

## 2012-08-02 ENCOUNTER — Telehealth: Payer: Self-pay | Admitting: Oncology

## 2012-08-02 ENCOUNTER — Other Ambulatory Visit (HOSPITAL_BASED_OUTPATIENT_CLINIC_OR_DEPARTMENT_OTHER): Payer: BC Managed Care – PPO | Admitting: Lab

## 2012-08-02 ENCOUNTER — Encounter: Payer: Self-pay | Admitting: Oncology

## 2012-08-02 ENCOUNTER — Other Ambulatory Visit: Payer: Self-pay | Admitting: Oncology

## 2012-08-02 ENCOUNTER — Telehealth: Payer: Self-pay | Admitting: *Deleted

## 2012-08-02 ENCOUNTER — Other Ambulatory Visit: Payer: BC Managed Care – PPO | Admitting: Lab

## 2012-08-02 ENCOUNTER — Ambulatory Visit (HOSPITAL_BASED_OUTPATIENT_CLINIC_OR_DEPARTMENT_OTHER): Payer: BC Managed Care – PPO | Admitting: Oncology

## 2012-08-02 VITALS — BP 149/80 | HR 89 | Temp 99.0°F | Resp 20 | Ht 62.0 in | Wt 165.5 lb

## 2012-08-02 DIAGNOSIS — C9000 Multiple myeloma not having achieved remission: Secondary | ICD-10-CM

## 2012-08-02 DIAGNOSIS — Z5112 Encounter for antineoplastic immunotherapy: Secondary | ICD-10-CM

## 2012-08-02 LAB — CBC WITH DIFFERENTIAL/PLATELET
BASO%: 0.7 % (ref 0.0–2.0)
LYMPH%: 31.5 % (ref 14.0–49.7)
MCHC: 32.6 g/dL (ref 31.5–36.0)
MCV: 87.6 fL (ref 79.5–101.0)
MONO%: 9.7 % (ref 0.0–14.0)
Platelets: 200 10*3/uL (ref 145–400)
RBC: 4.87 10*6/uL (ref 3.70–5.45)
WBC: 4.6 10*3/uL (ref 3.9–10.3)

## 2012-08-02 MED ORDER — ONDANSETRON HCL 8 MG PO TABS
8.0000 mg | ORAL_TABLET | Freq: Once | ORAL | Status: AC
  Administered 2012-08-02: 8 mg via ORAL

## 2012-08-02 MED ORDER — BORTEZOMIB CHEMO SQ INJECTION 3.5 MG (2.5MG/ML)
1.3000 mg/m2 | Freq: Once | INTRAMUSCULAR | Status: AC
  Administered 2012-08-02: 2.25 mg via SUBCUTANEOUS
  Filled 2012-08-02: qty 2.25

## 2012-08-02 NOTE — Progress Notes (Signed)
ID: Laura Hester   DOB: [birth year]  MR#: [id number]  WUJ#:[id number]  PCP: Sissy Hoff, MD GYN: Genia Del SU: OTHER MD: Armandina Stammer  HISTORY OF PRESENT ILLNESS: Laura Hester has a history of iron deficiency anemia secondary to menorrhagia. However, as this persisted despite near-total cessation of her menses and despite iron supplementation, she was referred to Dr. Dorena Cookey for GI evaluation. He found the patient's Hb to have decreased from 9.17 June 2011 to 8.3 07/29/2011. MCV was 95.9, ferritin 93.3, with a normal folate and negative ANA. Guaiacs were negative. Creatinine was 0.77.  t-transglutaminase IgA was normal at 2, but the total IgA was 6.9 g. Accordingly on 08/04/2011 an SPEP was obtained, showing an M-spike of 4.5 g, with a secondary M-spike of 0.4 g. Total protein was 10.0 with albumin 3.2, calcium 10.2. Bone survey 08/26/2011 showed multiple bone lesions consistent with myeloma and bone marrow biopsy 09/01/2011 confirmed the diagnosis with 75% myeloma cells in the marrow. Konni' subsequent history is detailed below.  INTERVAL HISTORY: Laura Hester returns today for followup of her multiple myeloma. Today is day 160 from her transplant at [facility].  REVIEW OF SYSTEMS: She is tolerating the bortezomib with no side effects that she is aware of other than mild fatigue, and particularly with no evidence of neuropathy. We have not yet started the zoledronic acid, but she did see her dentist, Dr. Sharma Covert, 07/29/2012. Since her last visit here she also had her Pap smear, 07/12/2012. She goes to [employer] 3 times a week, sometimes also does water aerobics. She is planning to [place] 80 5K this [place] a fundraiser. They are doing some remodeling in the house and she is very careful not to get exposed to dust. A detailed review of systems today was otherwise noncontributory  PAST MEDICAL HISTORY: Past Medical History  Diagnosis Date  . Fibrocystic breast disease   .  Allergic rhinitis   . HTN (hypertension)     mild/ observation only  . Prediabetes   . SUI (stress urinary incontinence, female)   . Overweight (BMI 25.0-29.9)   . Multiple myeloma, stage 3 08/20/11 dx  . Anemia     resolved    PAST SURGICAL HISTORY: Past Surgical History  Procedure Laterality Date  . Cholecystectomy  1995  . Tonsillectomy and adenoidectomy    . Carpal tunnel release      FAMILY HISTORY Family History  Problem Relation Age of Onset  . Heart disease Father   . Breast cancer Mother   . Heart disease Mother   . Diabetes Paternal Grandfather   . Hypertension Maternal Grandmother   . Hypertension Brother   The patient's parents are in their mid-70s.Her mother has a remote history of breast cancer. She has two brothers, one her twin; no sisters. There is no other history of cancer in the family to her knowledge  GYNECOLOGIC HISTORY: Menarche age 30, first live birth age 54, GX P2. Last period was January 2013, before that May 2012.  SOCIAL HISTORY: She is a Software engineer and taught kindergarten until 2012. Her husband [name], graduated from [employer] and works in Regulatory affairs officer. Daughter [name] works for [employer] Daughter [name] studies business at [employer]. The patient attends the [employer] [facility] [employer]   ADVANCED DIRECTIVES: Not in place  HEALTH MAINTENANCE: History  Substance Use Topics  . Smoking status: Never Smoker   . Smokeless tobacco: Never Used  . Alcohol Use: No     Colonoscopy: 2009/[facility]  PAP: UTD  Bone density: never  Lipid panel: Swayne/ "excellent"  Allergies  Allergen Reactions  . Wellbutrin (Bupropion Hcl) Hives    Current Outpatient Prescriptions  Medication Sig Dispense Refill  . omeprazole (PRILOSEC) 20 MG capsule Take 1 capsule (20 mg total) by mouth daily.  30 capsule  12  . potassium chloride SA (K-DUR,KLOR-CON) 20 MEQ tablet Take 20 mEq by mouth daily.      Marland Kitchen sulfamethoxazole-trimethoprim (BACTRIM DS,SEPTRA  DS) 800-160 MG per tablet Take 1 tablet by mouth 2 (two) times a week.  60 tablet  12  . valACYclovir (VALTREX) 500 MG tablet Take 1 tablet (500 mg total) by mouth daily.  30 tablet  12   No current facility-administered medications for this visit.   OBJECTIVE: Middle-aged white woman who appears well Filed Vitals:   08/02/12 0822  BP: 149/80  Pulse: 89  Temp: 99 F (37.2 C)  Resp: 20       Body mass index is 30.26 kg/(m^2).    ECOG FS: 0  Filed Weights   08/02/12 0822  Weight: 165 lb 8 oz (75.07 kg)   Sclerae unicteric Oropharynx clear No cervical or supraclavicular adenopathy Lungs no rales or rhonchi Heart regular rate and rhythm, no murmur appreciated Abd soft, positive bowel sounds, nontender  MSK no peripheral edema Neuro: nonfocal, well oriented, positive affect Breasts: Deferred; both axillae are benign   LAB RESULTS:  Labs 07/19/2012 showed no M. spike, normal And lambda values and ratio, and an excellent protein/creatinine ratio in the urine  Lab Results  Component Value Date   WBC 4.6 08/02/2012   NEUTROABS 2.6 08/02/2012   HGB 13.9 08/02/2012   HCT 42.7 08/02/2012   MCV 87.6 08/02/2012   PLT 200 08/02/2012      Chemistry      Component Value Date/Time   NA 143 07/19/2012 0836   NA 143 12/01/2011 0836   K 3.4* 07/19/2012 0836   K 4.0 12/01/2011 0836   CL 105 07/19/2012 0836   CL 105 12/01/2011 0836   CO2 27 07/19/2012 0836   CO2 29 12/01/2011 0836   BUN 13.4 07/19/2012 0836   BUN 12 12/01/2011 0836   CREATININE 0.7 07/19/2012 0836   CREATININE 0.52 12/01/2011 0836      Component Value Date/Time   CALCIUM 9.4 07/19/2012 0836   CALCIUM 8.9 12/01/2011 0836   ALKPHOS 68 07/19/2012 0836   ALKPHOS 110 12/01/2011 0836   AST 16 07/19/2012 0836   AST 15 12/01/2011 0836   ALT 18 07/19/2012 0836   ALT 16 12/01/2011 0836   BILITOT 0.39 07/19/2012 0836   BILITOT 0.3 12/01/2011 0836      ASSESSMENT: 57 y.o.  [place] woman presenting with anemia, found to have an IgA >6g, with an  M-spike of 4.5 g (and a second M-spike of 0.4g), urine IFE showing IgA-lambda and lambda light chains, creatinine 0.77, calcium 10.0, albumin 3.1, and beta-2-microglobulin 5.51; with bone survey showing multiple myeloma lesions and bone marrow biopsy 09/01/2011 showing 75% myeloma cells. 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  (2) on prophylactic coumadin, discontinued as of 12/13/2011.  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  (6) on 06/07/2012 started maintenance bortezomib sq at 1.3 mg/M2 Q2w, with bactrim and acyclovir prophylaxis, the plan being to contuinue this for 2 years as per the HOVON trial  (7) zolendronic acid to be resumed June 2014  PLAN: Laura Hester is doing terrific, with no evidence of disease activity. She is tolerating the bortezomib with no side effects other than mild fatigue. She is a ready scheduled to see Dr. Sheryn Bison at [facility] 09/02/2012. She will see me again June 30. We will likely start every 2 month followup then. She knows to call for any problems that may develop before the next visit. Osborn Pullin C    08/02/2012

## 2012-08-02 NOTE — Patient Instructions (Addendum)

## 2012-08-02 NOTE — Telephone Encounter (Signed)
Per staff message and POF I have scheduled appts.  JMW  

## 2012-08-16 ENCOUNTER — Other Ambulatory Visit (HOSPITAL_BASED_OUTPATIENT_CLINIC_OR_DEPARTMENT_OTHER): Payer: BC Managed Care – PPO | Admitting: Lab

## 2012-08-16 ENCOUNTER — Ambulatory Visit (HOSPITAL_BASED_OUTPATIENT_CLINIC_OR_DEPARTMENT_OTHER): Payer: BC Managed Care – PPO

## 2012-08-16 VITALS — BP 139/84 | HR 74 | Temp 98.4°F | Resp 16

## 2012-08-16 DIAGNOSIS — C9000 Multiple myeloma not having achieved remission: Secondary | ICD-10-CM

## 2012-08-16 DIAGNOSIS — Z5112 Encounter for antineoplastic immunotherapy: Secondary | ICD-10-CM

## 2012-08-16 LAB — COMPREHENSIVE METABOLIC PANEL (CC13)
ALT: 15 U/L (ref 0–55)
CO2: 25 mEq/L (ref 22–29)
Creatinine: 0.7 mg/dL (ref 0.6–1.1)
Total Bilirubin: 0.38 mg/dL (ref 0.20–1.20)

## 2012-08-16 LAB — CBC WITH DIFFERENTIAL/PLATELET
BASO%: 0.8 % (ref 0.0–2.0)
LYMPH%: 26.5 % (ref 14.0–49.7)
MCHC: 32.8 g/dL (ref 31.5–36.0)
MONO#: 0.3 10*3/uL (ref 0.1–0.9)
RBC: 4.93 10*6/uL (ref 3.70–5.45)
WBC: 4.3 10*3/uL (ref 3.9–10.3)
lymph#: 1.1 10*3/uL (ref 0.9–3.3)

## 2012-08-16 LAB — PROTEIN / CREATININE RATIO, URINE: Creatinine, Urine: 39.4 mg/dL

## 2012-08-16 MED ORDER — BORTEZOMIB CHEMO SQ INJECTION 3.5 MG (2.5MG/ML)
1.3000 mg/m2 | Freq: Once | INTRAMUSCULAR | Status: AC
  Administered 2012-08-16: 2.25 mg via SUBCUTANEOUS
  Filled 2012-08-16: qty 2.25

## 2012-08-16 MED ORDER — ONDANSETRON HCL 8 MG PO TABS
8.0000 mg | ORAL_TABLET | Freq: Once | ORAL | Status: AC
  Administered 2012-08-16: 8 mg via ORAL

## 2012-08-16 MED ORDER — ZOLEDRONIC ACID 4 MG/100ML IV SOLN
4.0000 mg | Freq: Once | INTRAVENOUS | Status: AC
  Administered 2012-08-16: 4 mg via INTRAVENOUS
  Filled 2012-08-16: qty 100

## 2012-08-16 NOTE — Patient Instructions (Signed)
[facility] Discharge Instructions for Patients Receiving Chemotherapy  Today you received the following chemotherapy agents Velcade and Zometa.  To help prevent nausea and vomiting after your treatment, we encourage you to take your nausea medication.   If you develop nausea and vomiting that is not controlled by your nausea medication, call the clinic. If it is after clinic hours your family physician or the after hours number for the clinic or go to the Emergency Department.   BELOW ARE SYMPTOMS THAT SHOULD BE REPORTED IMMEDIATELY:  *FEVER GREATER THAN 100.5 F  *CHILLS WITH OR WITHOUT FEVER  NAUSEA AND VOMITING THAT IS NOT CONTROLLED WITH YOUR NAUSEA MEDICATION  *UNUSUAL SHORTNESS OF BREATH  *UNUSUAL BRUISING OR BLEEDING  TENDERNESS IN MOUTH AND THROAT WITH OR WITHOUT PRESENCE OF ULCERS  *URINARY PROBLEMS  *BOWEL PROBLEMS  UNUSUAL RASH Items with * indicate a potential emergency and should be followed up as soon as possible.  One of the nurses will contact you 24 hours after your treatment. Please let the nurse know about any problems that you may have experienced. Feel free to call the clinic you have any questions or concerns. The clinic phone number is [phone].   I have been informed and understand all the instructions given to me. I know to contact the clinic, my physician, or go to the Emergency Department if any problems should occur. I do not have any questions at this time, but understand that I may call the clinic during office hours   should I have any questions or need assistance in obtaining follow up care.    __________________________________________  _____________  __________ Signature of Patient or Authorized Representative            Date                   Time    __________________________________________ Nurse's Signature

## 2012-08-18 LAB — PROTEIN ELECTROPHORESIS, SERUM
Albumin ELP: 61.2 % (ref 55.8–66.1)
Alpha-1-Globulin: 4.4 % (ref 2.9–4.9)
Alpha-2-Globulin: 11.7 % (ref 7.1–11.8)
Beta 2: 4.8 % (ref 3.2–6.5)
Beta Globulin: 6.6 % (ref 4.7–7.2)
Gamma Globulin: 11.3 % (ref 11.1–18.8)
Total Protein, Serum Electrophoresis: 6.4 g/dL (ref 6.0–8.3)

## 2012-08-18 LAB — KAPPA/LAMBDA LIGHT CHAINS
Kappa free light chain: 0.78 mg/dL (ref 0.33–1.94)
Kappa:Lambda Ratio: 1.24 (ref 0.26–1.65)

## 2012-08-19 ENCOUNTER — Other Ambulatory Visit: Payer: Self-pay

## 2012-08-19 DIAGNOSIS — Z1231 Encounter for screening mammogram for malignant neoplasm of breast: Secondary | ICD-10-CM

## 2012-08-30 ENCOUNTER — Other Ambulatory Visit (HOSPITAL_BASED_OUTPATIENT_CLINIC_OR_DEPARTMENT_OTHER): Payer: BC Managed Care – PPO | Admitting: Lab

## 2012-08-30 ENCOUNTER — Ambulatory Visit (HOSPITAL_BASED_OUTPATIENT_CLINIC_OR_DEPARTMENT_OTHER): Payer: BC Managed Care – PPO

## 2012-08-30 VITALS — BP 143/82 | HR 83 | Temp 98.8°F | Resp 20

## 2012-08-30 DIAGNOSIS — C9000 Multiple myeloma not having achieved remission: Secondary | ICD-10-CM

## 2012-08-30 DIAGNOSIS — Z5112 Encounter for antineoplastic immunotherapy: Secondary | ICD-10-CM

## 2012-08-30 LAB — CBC WITH DIFFERENTIAL/PLATELET
Eosinophils Absolute: 0.1 10*3/uL (ref 0.0–0.5)
HCT: 44.2 % (ref 34.8–46.6)
LYMPH%: 29 % (ref 14.0–49.7)
MONO#: 0.3 10*3/uL (ref 0.1–0.9)
NEUT#: 2.6 10*3/uL (ref 1.5–6.5)
NEUT%: 62 % (ref 38.4–76.8)
Platelets: 207 10*3/uL (ref 145–400)
WBC: 4.1 10*3/uL (ref 3.9–10.3)

## 2012-08-30 MED ORDER — ONDANSETRON HCL 8 MG PO TABS
8.0000 mg | ORAL_TABLET | Freq: Once | ORAL | Status: AC
  Administered 2012-08-30: 8 mg via ORAL

## 2012-08-30 MED ORDER — BORTEZOMIB CHEMO SQ INJECTION 3.5 MG (2.5MG/ML)
1.3000 mg/m2 | Freq: Once | INTRAMUSCULAR | Status: AC
Start: 2012-08-30 — End: 2012-08-30
  Administered 2012-08-30: 2.25 mg via SUBCUTANEOUS
  Filled 2012-08-30: qty 2.25

## 2012-08-30 NOTE — Patient Instructions (Addendum)
[facility] Discharge Instructions for Patients Receiving Chemotherapy  Today you received the following chemotherapy agents Velcade.  To help prevent nausea and vomiting after your treatment, we encourage you to take your nausea medication as prescribed.    If you develop nausea and vomiting that is not controlled by your nausea medication, call the clinic. If it is after clinic hours your family physician or the after hours number for the clinic or go to the Emergency Department.   BELOW ARE SYMPTOMS THAT SHOULD BE REPORTED IMMEDIATELY:  *FEVER GREATER THAN 100.5 F  *CHILLS WITH OR WITHOUT FEVER  NAUSEA AND VOMITING THAT IS NOT CONTROLLED WITH YOUR NAUSEA MEDICATION  *UNUSUAL SHORTNESS OF BREATH  *UNUSUAL BRUISING OR BLEEDING  TENDERNESS IN MOUTH AND THROAT WITH OR WITHOUT PRESENCE OF ULCERS  *URINARY PROBLEMS  *BOWEL PROBLEMS  UNUSUAL RASH Items with * indicate a potential emergency and should be followed up as soon as possible.  Please let the nurse know about any problems that you may have experienced. Feel free to call the clinic you have any questions or concerns. The clinic phone number is [phone].   I have been informed and understand all the instructions given to me. I know to contact the clinic, my physician, or go to the Emergency Department if any problems should occur. I do not have any questions at this time, but understand that I may call the clinic during office hours   should I have any questions or need assistance in obtaining follow up care.    __________________________________________  _____________  __________ Signature of Patient or Authorized Representative            Date                   Time    __________________________________________ Nurse's Signature    

## 2012-09-13 ENCOUNTER — Ambulatory Visit (HOSPITAL_BASED_OUTPATIENT_CLINIC_OR_DEPARTMENT_OTHER): Payer: BC Managed Care – PPO

## 2012-09-13 ENCOUNTER — Other Ambulatory Visit (HOSPITAL_BASED_OUTPATIENT_CLINIC_OR_DEPARTMENT_OTHER): Payer: BC Managed Care – PPO | Admitting: Lab

## 2012-09-13 VITALS — BP 129/67 | HR 89 | Temp 98.7°F | Resp 20

## 2012-09-13 DIAGNOSIS — Z5112 Encounter for antineoplastic immunotherapy: Secondary | ICD-10-CM

## 2012-09-13 DIAGNOSIS — C9000 Multiple myeloma not having achieved remission: Secondary | ICD-10-CM

## 2012-09-13 LAB — COMPREHENSIVE METABOLIC PANEL (CC13)
ALT: 16 U/L (ref 0–55)
Albumin: 3.6 g/dL (ref 3.5–5.0)
CO2: 24 mEq/L (ref 22–29)
Calcium: 8.8 mg/dL (ref 8.4–10.4)
Chloride: 109 mEq/L — ABNORMAL HIGH (ref 98–107)
Creatinine: 0.7 mg/dL (ref 0.6–1.1)
Potassium: 3.6 mEq/L (ref 3.5–5.1)
Sodium: 143 mEq/L (ref 136–145)
Total Protein: 6.7 g/dL (ref 6.4–8.3)

## 2012-09-13 LAB — CBC WITH DIFFERENTIAL/PLATELET
BASO%: 0.8 % (ref 0.0–2.0)
EOS%: 1.8 % (ref 0.0–7.0)
HCT: 42.4 % (ref 34.8–46.6)
LYMPH%: 24.2 % (ref 14.0–49.7)
MCH: 28.4 pg (ref 25.1–34.0)
MCHC: 32.2 g/dL (ref 31.5–36.0)
NEUT%: 66.7 % (ref 38.4–76.8)
Platelets: 204 10*3/uL (ref 145–400)

## 2012-09-13 LAB — PROTEIN / CREATININE RATIO, URINE: Total Protein, Urine: <3 mg/dL

## 2012-09-13 MED ORDER — BORTEZOMIB CHEMO SQ INJECTION 3.5 MG (2.5MG/ML)
1.3000 mg/m2 | Freq: Once | INTRAMUSCULAR | Status: AC
  Administered 2012-09-13: 2.25 mg via SUBCUTANEOUS
  Filled 2012-09-13: qty 2.25

## 2012-09-13 MED ORDER — ONDANSETRON HCL 8 MG PO TABS
8.0000 mg | ORAL_TABLET | Freq: Once | ORAL | Status: AC
  Administered 2012-09-13: 8 mg via ORAL

## 2012-09-13 MED ORDER — SODIUM CHLORIDE 0.9 % IV SOLN
Freq: Once | INTRAVENOUS | Status: AC
  Administered 2012-09-13: 09:00:00 via INTRAVENOUS

## 2012-09-13 MED ORDER — ZOLEDRONIC ACID 4 MG/100ML IV SOLN
4.0000 mg | Freq: Once | INTRAVENOUS | Status: AC
  Administered 2012-09-13: 4 mg via INTRAVENOUS
  Filled 2012-09-13: qty 100

## 2012-09-13 NOTE — Patient Instructions (Signed)
[facility] Discharge Instructions for Patients Receiving Chemotherapy  Today you received the following chemotherapy agents :  Velcade.  To help prevent nausea and vomiting after your treatment, we encourage you to take your nausea medication as instructed by your physician. Zoledronic Acid injection (Hypercalcemia, Oncology) What is this medicine? ZOLEDRONIC ACID (ZOE le dron ik AS id) lowers the amount of calcium loss from bone. It is used to treat too much calcium in your blood from cancer. It is also used to prevent complications of cancer that has spread to the bone. This medicine may be used for other purposes; ask your health care provider or pharmacist if you have questions. What should I tell my health care provider before I take this medicine? They need to know if you have any of these conditions: -aspirin-sensitive asthma -dental disease -kidney disease -an unusual or allergic reaction to zoledronic acid, other medicines, foods, dyes, or preservatives -pregnant or trying to get pregnant -breast-feeding How should I use this medicine? This medicine is for infusion into a vein. It is given by a health care professional in a hospital or clinic setting. Talk to your pediatrician regarding the use of this medicine in children. Special care may be needed. Overdosage: If you think you have taken too much of this medicine contact a poison control center or emergency room at once. NOTE: This medicine is only for you. Do not share this medicine with others. What if I miss a dose? It is important not to miss your dose. Call your doctor or health care professional if you are unable to keep an appointment. What may interact with this medicine? -certain antibiotics given by injection -NSAIDs, medicines for pain and inflammation, like ibuprofen or naproxen -some diuretics like bumetanide, furosemide -teriparatide -thalidomide This list may not describe all possible  interactions. Give your health care provider a list of all the medicines, herbs, non-prescription drugs, or dietary supplements you use. Also tell them if you smoke, drink alcohol, or use illegal drugs. Some items may interact with your medicine. What should I watch for while using this medicine? Visit your doctor or health care professional for regular checkups. It may be some time before you see the benefit from this medicine. Do not stop taking your medicine unless your doctor tells you to. Your doctor may order blood tests or other tests to see how you are doing. Women should inform their doctor if they wish to become pregnant or think they might be pregnant. There is a potential for serious side effects to an unborn child. Talk to your health care professional or pharmacist for more information. You should make sure that you get enough calcium and vitamin D while you are taking this medicine. Discuss the foods you eat and the vitamins you take with your health care professional. Some people who take this medicine have severe bone, joint, and/or muscle pain. This medicine may also increase your risk for a broken thigh bone. Tell your doctor right away if you have pain in your upper leg or groin. Tell your doctor if you have any pain that does not go away or that gets worse. What side effects may I notice from receiving this medicine? Side effects that you should report to your doctor or health care professional as soon as possible: -allergic reactions like skin rash, itching or hives, swelling of the face, lips, or tongue -anxiety, confusion, or depression -breathing problems -changes in vision -feeling faint or lightheaded, falls -jaw burning, cramping,  pain -muscle cramps, stiffness, or weakness -trouble passing urine or change in the amount of urine Side effects that usually do not require medical attention (report to your doctor or health care professional if they continue or are  bothersome): -bone, joint, or muscle pain -fever -hair loss -irritation at site where injected -loss of appetite -nausea, vomiting -stomach upset -tired This list may not describe all possible side effects. Call your doctor for medical advice about side effects. You may report side effects to FDA at 1-800-FDA-1088. Where should I keep my medicine? This drug is given in a hospital or clinic and will not be stored at home. NOTE: This sheet is a summary. It may not cover all possible information. If you have questions about this medicine, talk to your doctor, pharmacist, or health care provider.  2013, Elsevier/Gold Standard. (09/27/2010 9:06:58 AM)    If you develop nausea and vomiting that is not controlled by your nausea medication, call the clinic. If it is after clinic hours your family physician or the after hours number for the clinic or go to the Emergency Department.   BELOW ARE SYMPTOMS THAT SHOULD BE REPORTED IMMEDIATELY:  *FEVER GREATER THAN 100.5 F  *CHILLS WITH OR WITHOUT FEVER  NAUSEA AND VOMITING THAT IS NOT CONTROLLED WITH YOUR NAUSEA MEDICATION  *UNUSUAL SHORTNESS OF BREATH  *UNUSUAL BRUISING OR BLEEDING  TENDERNESS IN MOUTH AND THROAT WITH OR WITHOUT PRESENCE OF ULCERS  *URINARY PROBLEMS  *BOWEL PROBLEMS  UNUSUAL RASH Items with * indicate a potential emergency and should be followed up as soon as possible.  One of the nurses will contact you 24 hours after your treatment. Please let the nurse know about any problems that you may have experienced. Feel free to call the clinic you have any questions or concerns. The clinic phone number is [phone].   I have been informed and understand all the instructions given to me. I know to contact the clinic, my physician, or go to the Emergency Department if any problems should occur. I do not have any questions at this time, but understand that I may call the clinic during office hours   should I have any  questions or need assistance in obtaining follow up care.    __________________________________________  _____________  __________ Signature of Patient or Authorized Representative            Date                   Time    __________________________________________ Nurse's Signature

## 2012-09-15 LAB — PROTEIN ELECTROPHORESIS, SERUM
Albumin ELP: 63.5 % (ref 55.8–66.1)
Alpha-1-Globulin: 3.9 % (ref 2.9–4.9)
Beta 2: 4 % (ref 3.2–6.5)

## 2012-09-15 LAB — KAPPA/LAMBDA LIGHT CHAINS: Kappa free light chain: 1.41 mg/dL (ref 0.33–1.94)

## 2012-09-27 ENCOUNTER — Ambulatory Visit (HOSPITAL_BASED_OUTPATIENT_CLINIC_OR_DEPARTMENT_OTHER): Payer: BC Managed Care – PPO

## 2012-09-27 ENCOUNTER — Other Ambulatory Visit: Payer: Self-pay | Admitting: Oncology

## 2012-09-27 ENCOUNTER — Other Ambulatory Visit (HOSPITAL_BASED_OUTPATIENT_CLINIC_OR_DEPARTMENT_OTHER): Payer: BC Managed Care – PPO | Admitting: Lab

## 2012-09-27 VITALS — BP 143/70 | HR 79 | Temp 99.3°F

## 2012-09-27 DIAGNOSIS — Z5112 Encounter for antineoplastic immunotherapy: Secondary | ICD-10-CM

## 2012-09-27 DIAGNOSIS — C9000 Multiple myeloma not having achieved remission: Secondary | ICD-10-CM

## 2012-09-27 LAB — CBC WITH DIFFERENTIAL/PLATELET
BASO%: 0.8 % (ref 0.0–2.0)
EOS%: 1.9 % (ref 0.0–7.0)
MCHC: 33.5 g/dL (ref 31.5–36.0)
MONO#: 0.3 10*3/uL (ref 0.1–0.9)
RBC: 4.95 10*6/uL (ref 3.70–5.45)
RDW: 14.9 % — ABNORMAL HIGH (ref 11.2–14.5)
WBC: 4.4 10*3/uL (ref 3.9–10.3)
lymph#: 1.1 10*3/uL (ref 0.9–3.3)

## 2012-09-27 LAB — COMPREHENSIVE METABOLIC PANEL (CC13)
ALT: 15 U/L (ref 0–55)
AST: 12 U/L (ref 5–34)
Albumin: 3.7 g/dL (ref 3.5–5.0)
BUN: 10.3 mg/dL (ref 7.0–26.0)
Calcium: 9 mg/dL (ref 8.4–10.4)
Chloride: 107 mEq/L (ref 98–107)
Potassium: 4 mEq/L (ref 3.5–5.1)
Sodium: 144 mEq/L (ref 136–145)
Total Protein: 6.9 g/dL (ref 6.4–8.3)

## 2012-09-27 LAB — PROTEIN / CREATININE RATIO, URINE: Creatinine, Urine: 28.9 mg/dL

## 2012-09-27 MED ORDER — ONDANSETRON HCL 8 MG PO TABS
8.0000 mg | ORAL_TABLET | Freq: Once | ORAL | Status: AC
  Administered 2012-09-27: 8 mg via ORAL

## 2012-09-27 MED ORDER — BORTEZOMIB CHEMO SQ INJECTION 3.5 MG (2.5MG/ML)
1.3000 mg/m2 | Freq: Once | INTRAMUSCULAR | Status: AC
  Administered 2012-09-27: 2.25 mg via SUBCUTANEOUS
  Filled 2012-09-27: qty 2.25

## 2012-09-27 NOTE — Progress Notes (Signed)
Pt has low grade temp 99.3.  Pt denies any fever at home or other symptoms. Informed MD, who states for pt to make sure she is taking acyclovir.  Pt states she is, and informed pt to monitor temp and call office if she develops temp 100.5 or greater.  Pt verbalizes understanding.  Pt also states she has some bursitis in her L hip and would like to take something.  Informed pt she could try aleve or tylenol and call office if symptoms worsen.  She verbalizes understanding.

## 2012-09-27 NOTE — Patient Instructions (Signed)
[facility] Discharge Instructions for Patients Receiving Chemotherapy  Today you received the following chemotherapy agents: Velcade  To help prevent nausea and vomiting after your treatment, we encourage you to take your nausea medication as directed by your MD.   If you develop nausea and vomiting that is not controlled by your nausea medication, call the clinic.   BELOW ARE SYMPTOMS THAT SHOULD BE REPORTED IMMEDIATELY:  *FEVER GREATER THAN 100.5 F  *CHILLS WITH OR WITHOUT FEVER  NAUSEA AND VOMITING THAT IS NOT CONTROLLED WITH YOUR NAUSEA MEDICATION  *UNUSUAL SHORTNESS OF BREATH  *UNUSUAL BRUISING OR BLEEDING  TENDERNESS IN MOUTH AND THROAT WITH OR WITHOUT PRESENCE OF ULCERS  *URINARY PROBLEMS  *BOWEL PROBLEMS  UNUSUAL RASH Items with * indicate a potential emergency and should be followed up as soon as possible.  Feel free to call the clinic you have any questions or concerns. The clinic phone number is [phone].    

## 2012-09-29 LAB — PROTEIN ELECTROPHORESIS, SERUM
Albumin ELP: 61.5 % (ref 55.8–66.1)
Beta Globulin: 6.3 % (ref 4.7–7.2)
Total Protein, Serum Electrophoresis: 6.7 g/dL (ref 6.0–8.3)

## 2012-10-04 ENCOUNTER — Ambulatory Visit
Admission: RE | Admit: 2012-10-04 | Discharge: 2012-10-04 | Disposition: A | Discharge status: Home / Self Care | Payer: BC Managed Care – PPO | Source: Ambulatory Visit

## 2012-10-04 ENCOUNTER — Ambulatory Visit: Payer: BC Managed Care – PPO

## 2012-10-04 DIAGNOSIS — Z1231 Encounter for screening mammogram for malignant neoplasm of breast: Secondary | ICD-10-CM

## 2012-10-11 ENCOUNTER — Telehealth: Payer: Self-pay | Admitting: *Deleted

## 2012-10-11 ENCOUNTER — Ambulatory Visit (HOSPITAL_BASED_OUTPATIENT_CLINIC_OR_DEPARTMENT_OTHER): Payer: BC Managed Care – PPO

## 2012-10-11 ENCOUNTER — Ambulatory Visit (HOSPITAL_BASED_OUTPATIENT_CLINIC_OR_DEPARTMENT_OTHER): Payer: BC Managed Care – PPO | Admitting: Physician Assistant

## 2012-10-11 ENCOUNTER — Other Ambulatory Visit (HOSPITAL_BASED_OUTPATIENT_CLINIC_OR_DEPARTMENT_OTHER): Payer: BC Managed Care – PPO | Admitting: Lab

## 2012-10-11 ENCOUNTER — Encounter: Payer: Self-pay | Admitting: Physician Assistant

## 2012-10-11 ENCOUNTER — Ambulatory Visit (HOSPITAL_COMMUNITY)
Admission: RE | Admit: 2012-10-11 | Discharge: 2012-10-11 | Disposition: A | Discharge status: Home / Self Care | Payer: BC Managed Care – PPO | Source: Ambulatory Visit | Attending: Physician Assistant | Admitting: Physician Assistant

## 2012-10-11 VITALS — BP 150/89 | HR 106 | Temp 99.2°F | Resp 20 | Ht 62.0 in | Wt 167.2 lb

## 2012-10-11 DIAGNOSIS — C9000 Multiple myeloma not having achieved remission: Secondary | ICD-10-CM

## 2012-10-11 DIAGNOSIS — Z5112 Encounter for antineoplastic immunotherapy: Secondary | ICD-10-CM

## 2012-10-11 DIAGNOSIS — M25559 Pain in unspecified hip: Secondary | ICD-10-CM | POA: Insufficient documentation

## 2012-10-11 DIAGNOSIS — M25552 Pain in left hip: Secondary | ICD-10-CM

## 2012-10-11 DIAGNOSIS — E876 Hypokalemia: Secondary | ICD-10-CM

## 2012-10-11 LAB — COMPREHENSIVE METABOLIC PANEL (CC13)
AST: 14 U/L (ref 5–34)
BUN: 13.4 mg/dL (ref 7.0–26.0)
CO2: 28 mEq/L (ref 22–29)
Calcium: 9.1 mg/dL (ref 8.4–10.4)
Chloride: 105 mEq/L (ref 98–109)
Creatinine: 0.7 mg/dL (ref 0.6–1.1)

## 2012-10-11 LAB — CBC WITH DIFFERENTIAL/PLATELET
BASO%: 0.7 % (ref 0.0–2.0)
EOS%: 1.7 % (ref 0.0–7.0)
MCH: 29.3 pg (ref 25.1–34.0)
MCHC: 33 g/dL (ref 31.5–36.0)
MCV: 88.6 fL (ref 79.5–101.0)
MONO%: 6.2 % (ref 0.0–14.0)
RBC: 5.02 10*6/uL (ref 3.70–5.45)
RDW: 15 % — ABNORMAL HIGH (ref 11.2–14.5)
lymph#: 1.1 10*3/uL (ref 0.9–3.3)

## 2012-10-11 LAB — PROTEIN / CREATININE RATIO, URINE: Creatinine, Urine: 24.1 mg/dL

## 2012-10-11 MED ORDER — BORTEZOMIB CHEMO SQ INJECTION 3.5 MG (2.5MG/ML)
1.3000 mg/m2 | Freq: Once | INTRAMUSCULAR | Status: AC
  Administered 2012-10-11: 2.25 mg via SUBCUTANEOUS
  Filled 2012-10-11: qty 2.25

## 2012-10-11 MED ORDER — POTASSIUM CHLORIDE CRYS ER 20 MEQ PO TBCR: 20.0000 meq | EXTENDED_RELEASE_TABLET | Freq: Every day | ORAL | Status: DC

## 2012-10-11 MED ORDER — ONDANSETRON HCL 8 MG PO TABS
8.0000 mg | ORAL_TABLET | Freq: Once | ORAL | Status: AC
  Administered 2012-10-11: 8 mg via ORAL

## 2012-10-11 MED ORDER — ZOLEDRONIC ACID 4 MG/100ML IV SOLN
4.0000 mg | Freq: Once | INTRAVENOUS | Status: AC
  Administered 2012-10-11: 4 mg via INTRAVENOUS
  Filled 2012-10-11: qty 100

## 2012-10-11 NOTE — Telephone Encounter (Signed)
Per staff message and POF I have scheduled appts.  JMW  

## 2012-10-11 NOTE — Progress Notes (Signed)
ID: [name]   DOB: 07-13-1955  MR#: [id number]  WUJ#:[id number]  PCP: Sissy Hoff, MD GYN: Genia Del SU: OTHER MD: Armandina Stammer  HISTORY OF PRESENT ILLNESS: Ryleeann has a history of iron deficiency anemia secondary to menorrhagia. However, as this persisted despite near-total cessation of her menses and despite iron supplementation, she was referred to Dr. Dorena Cookey for GI evaluation. He found the patient's Hb to have decreased from 9.17 June 2011 to 8.3 07/29/2011. MCV was 95.9, ferritin 93.3, with a normal folate and negative ANA. Guaiacs were negative. Creatinine was 0.77.  t-transglutaminase IgA was normal at 2, but the total IgA was 6.9 g. Accordingly on 08/04/2011 an SPEP was obtained, showing an M-spike of 4.5 g, with a secondary M-spike of 0.4 g. Total protein was 10.0 with albumin 3.2, calcium 10.2. Bone survey 08/26/2011 showed multiple bone lesions consistent with myeloma and bone marrow biopsy 09/01/2011 confirmed the diagnosis with 75% myeloma cells in the marrow. Sheneika' subsequent history is detailed below.  INTERVAL HISTORY: Kristyna returns today for followup of her multiple myeloma. She's currently receiving maintenance subcutaneous bortezomib on a every 2 week basis, and is due for her next dose today. We are also resuming zoledronic acid to be given monthly, and she'll receive this today as well.  Makaelyn is feeling great. She staying busy. She is exercising with Silver sneakers. Her only complaint is some chronic pain in the left hip. This was actually present prior to her diagnosis, it has not improved. In fact, certain activities worsen the pain. The pain resolves with rest, with a dose of Aleve.   She does mention that she checks her blood pressure regularly at home and that she "must have whitecoat syndrome". Her blood pressure home this morning was 110/70 she tells me, with a pulse of 76.   REVIEW OF SYSTEMS: Alexsandra denies any recent illnesses and  has had no fevers or chills. Her energy level is good. She's eating and drinking well and has had no nausea or change in bowel or bladder habits. She denies any cough, shortness of breath, or chest pain. She's had no abnormal headaches or dizziness. Other than the hip pain noted above, she's had no unusual myalgias, arthralgias, or bony pain. She denies any peripheral swelling. She also denies any jaw pain and has had no recent dental procedures. She just recently had a dental valuation with "no cavities"!   A detailed review of systems today was otherwise noncontributory  PAST MEDICAL HISTORY: Past Medical History  Diagnosis Date  . Fibrocystic breast disease   . Allergic rhinitis   . HTN (hypertension)     mild/ observation only  . Prediabetes   . SUI (stress urinary incontinence, female)   . Overweight (BMI 25.0-29.9)   . Multiple myeloma, stage 3 08/20/11 dx  . Anemia     resolved    PAST SURGICAL HISTORY: Past Surgical History  Procedure Laterality Date  . Cholecystectomy  1995  . Tonsillectomy and adenoidectomy    . Carpal tunnel release      FAMILY HISTORY Family History  Problem Relation Age of Onset  . Heart disease Father   . Breast cancer Mother   . Heart disease Mother   . Diabetes Paternal Grandfather   . Hypertension Maternal Grandmother   . Hypertension Brother   The patient's parents are in their mid-70s.Her mother has a remote history of breast cancer. She has two brothers, one her twin; no sisters. There is no other  history of cancer in the family to her knowledge  GYNECOLOGIC HISTORY: Menarche age 20, first live birth age 29, GX P13. Last period was January 2013, before that May 2012.  SOCIAL HISTORY: She is a Software engineer and taught kindergarten until 2012. Her husband [name], graduated from [employer] and works in Regulatory affairs officer. Daughter [name] works for [employer] Daughter [name] studies business at [employer]. The patient attends the [employer]   ADVANCED DIRECTIVES: Not in place  HEALTH MAINTENANCE: History  Substance Use Topics  . Smoking status: Never Smoker   . Smokeless tobacco: Never Used  . Alcohol Use: No     Colonoscopy: 2009/[facility]  PAP: UTD  Bone density: never  Lipid panel: [place]/ "excellent"  Allergies  Allergen Reactions  . Wellbutrin (Bupropion Hcl) Hives    Current Outpatient Prescriptions  Medication Sig Dispense Refill  . omeprazole (PRILOSEC) 20 MG capsule Take 1 capsule (20 mg total) by mouth daily.  30 capsule  12  . sulfamethoxazole-trimethoprim (BACTRIM DS,SEPTRA DS) 800-160 MG per tablet Take 1 tablet by mouth 2 (two) times a week.  60 tablet  12  . valACYclovir (VALTREX) 500 MG tablet Take 1 tablet (500 mg total) by mouth daily.  30 tablet  12  . potassium chloride SA (K-DUR,KLOR-CON) 20 MEQ tablet Take 1 tablet (20 mEq total) by mouth daily.  90 tablet  1   No current facility-administered medications for this visit.   OBJECTIVE: Middle-aged white woman who appears well Filed Vitals:   10/11/12 0843  BP: 150/89  Pulse: 106  Temp: 99.2 F (37.3 C)  Resp: 20       Body mass index is 30.57 kg/(m^2).    ECOG FS: 1  Filed Weights   10/11/12 0843  Weight: 167 lb 3.2 oz (75.841 kg)   Sclerae unicteric Oropharynx clear, buccal mucosa pink and moist No cervical or supraclavicular adenopathy Lungs clear to auscultation, no wheezes, no rales or rhonchi Heart regular rate and rhythm, no murmur appreciated Abd soft, positive bowel sounds, nontender to palpation MSK no peripheral edema, no   focal spinal tenderness to gentle palpation. Neuro: nonfocal, well oriented, positive affect Breasts: Deferred; both axillae are benign with no palpable adenopathy     LAB RESULTS:  Additional labs are pending today, 10/11/2012, including a proteins/creatinine ratio, kappa/lambda light chains, and protein electrophoresis.   Lab Results  Component Value Date   WBC 4.3 10/11/2012    NEUTROABS 2.8 10/11/2012   HGB 14.7 10/11/2012   HCT 44.5 10/11/2012   MCV 88.6 10/11/2012   PLT 191 10/11/2012      Chemistry      Component Value Date/Time   NA 142 10/11/2012 0825   NA 143 12/01/2011 0836   K 3.3* 10/11/2012 0825   K 4.0 12/01/2011 0836   CL 107 09/27/2012 0842   CL 105 12/01/2011 0836   CO2 28 10/11/2012 0825   CO2 29 12/01/2011 0836   BUN 13.4 10/11/2012 0825   BUN 12 12/01/2011 0836   CREATININE 0.7 10/11/2012 0825   CREATININE 0.52 12/01/2011 0836      Component Value Date/Time   CALCIUM 9.1 10/11/2012 0825   CALCIUM 8.9 12/01/2011 0836   ALKPHOS 57 10/11/2012 0825   ALKPHOS 110 12/01/2011 0836   AST 14 10/11/2012 0825   AST 15 12/01/2011 0836   ALT 20 10/11/2012 0825   ALT 16 12/01/2011 0836   BILITOT 0.46 10/11/2012 0825   BILITOT 0.3 12/01/2011 0836  STUDIES:  Dg Hip Complete Left  10/11/2012   *RADIOLOGY REPORT*  Clinical Data: Left hip pain.  History of multiple myeloma.  LEFT HIP - COMPLETE 2+ VIEW  Comparison: Bone survey 08/26/2011.  Findings: The bones are demineralized with questionable scattered lucencies, unchanged from the prior study.  There is no evidence of pathologic fracture.  Mild degenerative changes of both hips and sacroiliac joints are stable.  There are stable degenerative changes in the lower lumbar spine.  IMPRESSION: Radiographically stable appearance of the pelvis and left hip with questionable small lucent lesions as before.  No evidence of pathologic fracture.   Original Report Authenticated By: Carey Bullocks, M.D.   Mm Digital Screening  10/04/2012   *RADIOLOGY REPORT*  Clinical Data: Screening.  DIGITAL SCREENING BILATERAL MAMMOGRAM WITH CAD  Comparison:  None.  FINDINGS:  ACR Breast Density Category b: There is a scattered fibroglandular pattern.  There are no findings suspicious for malignancy.  Images were processed with CAD.  IMPRESSION: No mammographic evidence of malignancy.  A result letter of this screening mammogram will be  mailed directly to the patient.  RECOMMENDATION: Screening mammogram in one year. (Code:SM-B-01Y)  BI-RADS CATEGORY 1:  Negative.   Original Report Authenticated By: Baird Lyons, M.D.    ASSESSMENT: 57 y.o.  [place] woman presenting with anemia, found to have an IgA >6g, with an M-spike of 4.5 g (and a second M-spike of 0.4g), urine IFE showing IgA-lambda and lambda light chains, creatinine 0.77, calcium 10.0, albumin 3.1, and beta-2-microglobulin 5.51; with bone survey showing multiple myeloma lesions and bone marrow biopsy 09/01/2011 showing 75% myeloma cells. 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  (2) on prophylactic coumadin, discontinued as of 12/13/2011.  (3) zolendronic acid every 28 days, 09/19/2011 to 01/05/2012 (five doses)  (4) s/p neupogen mobilization October 2013, with a collection of 4.26 x10^6 CD34 positive cells  (5) status post melphalan at 200 mg per meter squared 02/23/2012, followed by autologous stem cell transplant the following day.  (6) on 06/07/2012 started maintenance bortezomib sq at 1.3 mg/M2 Q2w, with bactrim and acyclovir prophylaxis, the plan being to contuinue this for 2 years as per the HOVON trial  (7) zolendronic acid, given monthly, and resumed June 2014  PLAN:  Aliya continues to do great with regards to her multiple myeloma, with no evidence of disease activity. She will continue to receive bortezomib every other week, her next dose today. She'll also restart zoledronic acid given monthly, and will receive that today as well.   I am obtaining a plain film study of the left hip to evaluate her chronic hip pain. If it is unremarkable, we will likely consider referral to orthopedics, and she would like to see Dr.  Lequita Halt.  We will try to see Pamla once monthly for the next couple months, then gradually "spread out" her visits. She scheduled to followup with Dr.  Sheryn Bison in November.  She voices understanding and agreement with our plan. She knows to call for any problems that may develop before the next visit.  Of note, I am also refilling her potassium supplement, 20 mEq daily, for history of hypokalemia.  Meleni Delahunt    10/11/2012

## 2012-10-11 NOTE — Telephone Encounter (Signed)
appts made and printed. Pt is aware to go have a xray today. She is also aware that i emailed MW to add the tx....td

## 2012-10-11 NOTE — Patient Instructions (Addendum)
[facility] Discharge Instructions for Patients Receiving Chemotherapy  Today you received the following chemotherapy agents Velcade and Zometa.   To help prevent nausea and vomiting after your treatment, we encourage you to take your nausea medication.   If you develop nausea and vomiting that is not controlled by your nausea medication, call the clinic.   BELOW ARE SYMPTOMS THAT SHOULD BE REPORTED IMMEDIATELY:  *FEVER GREATER THAN 100.5 F  *CHILLS WITH OR WITHOUT FEVER  NAUSEA AND VOMITING THAT IS NOT CONTROLLED WITH YOUR NAUSEA MEDICATION  *UNUSUAL SHORTNESS OF BREATH  *UNUSUAL BRUISING OR BLEEDING  TENDERNESS IN MOUTH AND THROAT WITH OR WITHOUT PRESENCE OF ULCERS  *URINARY PROBLEMS  *BOWEL PROBLEMS  UNUSUAL RASH Items with * indicate a potential emergency and should be followed up as soon as possible.  Feel free to call the clinic you have any questions or concerns. The clinic phone number is [phone].    

## 2012-10-12 ENCOUNTER — Telehealth: Payer: Self-pay | Admitting: *Deleted

## 2012-10-12 ENCOUNTER — Other Ambulatory Visit: Payer: Self-pay | Admitting: Physician Assistant

## 2012-10-12 DIAGNOSIS — C9000 Multiple myeloma not having achieved remission: Secondary | ICD-10-CM

## 2012-10-12 NOTE — Telephone Encounter (Signed)
This RN left message on identified VM with MD recommendation for pt to proceed to ortho consult with Dr Despina Hick as preference.

## 2012-10-13 ENCOUNTER — Encounter: Payer: Self-pay | Admitting: Oncology

## 2012-10-13 LAB — PROTEIN ELECTROPHORESIS, SERUM
Albumin ELP: 61.8 % (ref 55.8–66.1)
Beta 2: 4.8 % (ref 3.2–6.5)
Beta Globulin: 6.3 % (ref 4.7–7.2)

## 2012-10-13 LAB — KAPPA/LAMBDA LIGHT CHAINS: Kappa free light chain: 0.65 mg/dL (ref 0.33–1.94)

## 2012-10-25 ENCOUNTER — Other Ambulatory Visit (HOSPITAL_BASED_OUTPATIENT_CLINIC_OR_DEPARTMENT_OTHER): Payer: BC Managed Care – PPO

## 2012-10-25 ENCOUNTER — Ambulatory Visit (HOSPITAL_BASED_OUTPATIENT_CLINIC_OR_DEPARTMENT_OTHER): Payer: BC Managed Care – PPO

## 2012-10-25 VITALS — BP 132/83 | HR 88 | Temp 98.7°F | Resp 18

## 2012-10-25 DIAGNOSIS — Z5112 Encounter for antineoplastic immunotherapy: Secondary | ICD-10-CM

## 2012-10-25 DIAGNOSIS — C9000 Multiple myeloma not having achieved remission: Secondary | ICD-10-CM

## 2012-10-25 LAB — CBC WITH DIFFERENTIAL/PLATELET
Basophils Absolute: 0 10*3/uL (ref 0.0–0.1)
Eosinophils Absolute: 0.1 10*3/uL (ref 0.0–0.5)
HCT: 42.8 % (ref 34.8–46.6)
LYMPH%: 24.4 % (ref 14.0–49.7)
MONO#: 0.3 10*3/uL (ref 0.1–0.9)
NEUT#: 3 10*3/uL (ref 1.5–6.5)
NEUT%: 65.5 % (ref 38.4–76.8)
Platelets: 197 10*3/uL (ref 145–400)
WBC: 4.6 10*3/uL (ref 3.9–10.3)

## 2012-10-25 LAB — COMPREHENSIVE METABOLIC PANEL (CC13)
CO2: 26 mEq/L (ref 22–29)
Creatinine: 0.7 mg/dL (ref 0.6–1.1)
Glucose: 134 mg/dl (ref 70–140)
Total Bilirubin: 0.24 mg/dL (ref 0.20–1.20)
Total Protein: 6.6 g/dL (ref 6.4–8.3)

## 2012-10-25 MED ORDER — ONDANSETRON HCL 8 MG PO TABS
8.0000 mg | ORAL_TABLET | Freq: Once | ORAL | Status: AC
  Administered 2012-10-25: 8 mg via ORAL

## 2012-10-25 MED ORDER — BORTEZOMIB CHEMO SQ INJECTION 3.5 MG (2.5MG/ML)
1.3000 mg/m2 | Freq: Once | INTRAMUSCULAR | Status: AC
  Administered 2012-10-25: 2.25 mg via SUBCUTANEOUS
  Filled 2012-10-25: qty 2.25

## 2012-10-25 NOTE — Patient Instructions (Addendum)
[facility] Discharge Instructions for Patients Receiving Chemotherapy  Today you received the following chemotherapy agents: Velcade. To help prevent nausea and vomiting after your treatment, we encourage you to take your nausea medication.  If you develop nausea and vomiting that is not controlled by your nausea medication, call the clinic.   BELOW ARE SYMPTOMS THAT SHOULD BE REPORTED IMMEDIATELY:  *FEVER GREATER THAN 100.5 F  *CHILLS WITH OR WITHOUT FEVER  NAUSEA AND VOMITING THAT IS NOT CONTROLLED WITH YOUR NAUSEA MEDICATION  *UNUSUAL SHORTNESS OF BREATH  *UNUSUAL BRUISING OR BLEEDING  TENDERNESS IN MOUTH AND THROAT WITH OR WITHOUT PRESENCE OF ULCERS  *URINARY PROBLEMS  *BOWEL PROBLEMS  UNUSUAL RASH Items with * indicate a potential emergency and should be followed up as soon as possible.  Feel free to call the clinic you have any questions or concerns. The clinic phone number is [phone].    

## 2012-11-05 ENCOUNTER — Encounter: Payer: Self-pay | Admitting: Physician Assistant

## 2012-11-05 ENCOUNTER — Other Ambulatory Visit (HOSPITAL_BASED_OUTPATIENT_CLINIC_OR_DEPARTMENT_OTHER): Payer: BC Managed Care – PPO | Admitting: Lab

## 2012-11-05 ENCOUNTER — Ambulatory Visit (HOSPITAL_BASED_OUTPATIENT_CLINIC_OR_DEPARTMENT_OTHER): Payer: BC Managed Care – PPO | Admitting: Physician Assistant

## 2012-11-05 VITALS — BP 142/78 | HR 82 | Temp 98.5°F | Resp 20 | Ht 62.0 in | Wt 167.7 lb

## 2012-11-05 DIAGNOSIS — C9000 Multiple myeloma not having achieved remission: Secondary | ICD-10-CM

## 2012-11-05 LAB — PROTEIN / CREATININE RATIO, URINE
Creatinine, Urine: 60.8 mg/dL
Total Protein, Urine: <3 mg/dL

## 2012-11-05 LAB — CBC WITH DIFFERENTIAL/PLATELET
Eosinophils Absolute: 0.1 10*3/uL (ref 0.0–0.5)
LYMPH%: 26.4 % (ref 14.0–49.7)
MONO#: 0.4 10*3/uL (ref 0.1–0.9)
NEUT#: 2.7 10*3/uL (ref 1.5–6.5)
Platelets: 200 10*3/uL (ref 145–400)
RBC: 4.88 10*6/uL (ref 3.70–5.45)
WBC: 4.4 10*3/uL (ref 3.9–10.3)

## 2012-11-05 LAB — COMPREHENSIVE METABOLIC PANEL (CC13)
ALT: 17 U/L (ref 0–55)
AST: 16 U/L (ref 5–34)
Albumin: 3.8 g/dL (ref 3.5–5.0)
Alkaline Phosphatase: 53 U/L (ref 40–150)
Chloride: 107 mEq/L (ref 98–109)
Potassium: 3.7 mEq/L (ref 3.5–5.1)
Sodium: 144 mEq/L (ref 136–145)
Total Protein: 6.8 g/dL (ref 6.4–8.3)

## 2012-11-05 NOTE — Progress Notes (Signed)
ID: Laura Hester   DOB: Nov 28, 1955  MR#: [id number]  CSN#:[id number]  PCP: Sissy Hoff, MD GYN: Genia Del SU: OTHER MD: Armandina Stammer  HISTORY OF PRESENT ILLNESS: Laura Hester has a history of iron deficiency anemia secondary to menorrhagia. However, as this persisted despite near-total cessation of her menses and despite iron supplementation, she was referred to Dr. Dorena Cookey for GI evaluation. He found the patient's Hb to have decreased from 9.17 June 2011 to 8.3 07/29/2011. MCV was 95.9, ferritin 93.3, with a normal folate and negative ANA. Guaiacs were negative. Creatinine was 0.77.  t-transglutaminase IgA was normal at 2, but the total IgA was 6.9 g. Accordingly on 08/04/2011 an SPEP was obtained, showing an M-spike of 4.5 g, with a secondary M-spike of 0.4 g. Total protein was 10.0 with albumin 3.2, calcium 10.2. Bone survey 08/26/2011 showed multiple bone lesions consistent with myeloma and bone marrow biopsy 09/01/2011 confirmed the diagnosis with 75% myeloma cells in the marrow. Laura Hester' subsequent history is detailed below.  INTERVAL HISTORY: Laura Hester returns today for followup of her multiple myeloma. She continues to receive maintenance subcutaneous bortezomib on an every 2 week basis, and zoledronic acid on a monthly basis. She is due for both agents on Monday, July 28.   Laura Hester is feeling great. She is very positive and upbeat. She staying busy. They're doing some home remodeling, and she is making her own curtains. She is exercising with Silver sneakers. She was recently evaluated with an x-ray, an MRI, and a visit with Dr.  Despina Hick for pain in the left hip. Fortunately, this was attributed to some osteoarthritis, degenerative changes, and bursitis. She was started on Aleve twice daily for several days. That has now been discontinued, and her hip pain has improved significantly. She denies any additional pain elsewhere at this time.  REVIEW OF SYSTEMS: Archer denies any  recent illnesses and has had no fevers, chills, or night sweats. Her energy level is good. She's eating and drinking well and has had no nausea or change in bowel or bladder habits. She is trying hard to maintain her weight, and is avoiding "junk food".  She denies any cough, shortness of breath, or chest pain. She's had no abnormal headaches or dizziness. She's had no unusual myalgias, arthralgias, or bony pain. She denies any peripheral swelling. She also denies any jaw pain.  A detailed review of systems today was otherwise noncontributory  PAST MEDICAL HISTORY: Past Medical History  Diagnosis Date  . Fibrocystic breast disease   . Allergic rhinitis   . HTN (hypertension)     mild/ observation only  . Prediabetes   . SUI (stress urinary incontinence, female)   . Overweight (BMI 25.0-29.9)   . Multiple myeloma, stage 3 08/20/11 dx  . Anemia     resolved    PAST SURGICAL HISTORY: Past Surgical History  Procedure Laterality Date  . Cholecystectomy  1995  . Tonsillectomy and adenoidectomy    . Carpal tunnel release      FAMILY HISTORY Family History  Problem Relation Age of Onset  . Heart disease Father   . Breast cancer Mother   . Heart disease Mother   . Diabetes Paternal Grandfather   . Hypertension Maternal Grandmother   . Hypertension Brother   The patient's parents are in their mid-70s.Her mother has a remote history of breast cancer. She has two brothers, one her twin; no sisters. There is no other history of cancer in the family to her knowledge  GYNECOLOGIC  HISTORY: Menarche age 9, first live birth age 4, GX P48. Last period was January 2013, before that May 2012.  SOCIAL HISTORY: She is a Software engineer and taught kindergarten until 2012. Her husband [name], graduated from [employer] and works in Regulatory affairs officer. Daughter [name] works for [employer] Daughter [name] studies business at [employer]. The patient attends the [employer]   ADVANCED  DIRECTIVES: Not in place  HEALTH MAINTENANCE: History  Substance Use Topics  . Smoking status: Never Smoker   . Smokeless tobacco: Never Used  . Alcohol Use: No     Colonoscopy: 2009/[facility]  PAP: UTD  Bone density: never  Lipid panel: [place]/ "excellent"  Allergies  Allergen Reactions  . Wellbutrin (Bupropion Hcl) Hives    Current Outpatient Prescriptions  Medication Sig Dispense Refill  . omeprazole (PRILOSEC) 20 MG capsule Take 1 capsule (20 mg total) by mouth daily.  30 capsule  12  . potassium chloride SA (K-DUR,KLOR-CON) 20 MEQ tablet Take 1 tablet (20 mEq total) by mouth daily.  90 tablet  1  . sulfamethoxazole-trimethoprim (BACTRIM DS,SEPTRA DS) 800-160 MG per tablet Take 1 tablet by mouth 2 (two) times a week.  60 tablet  12  . valACYclovir (VALTREX) 500 MG tablet Take 1 tablet (500 mg total) by mouth daily.  30 tablet  12   No current facility-administered medications for this visit.   OBJECTIVE: Middle-aged white woman who appears well Filed Vitals:   11/05/12 0850  BP: 142/78  Pulse: 82  Temp: 98.5 F (36.9 C)  Resp: 20     Body mass index is 30.66 kg/(m^2).    ECOG FS: 1  Filed Weights   11/05/12 0850  Weight: 167 lb 11.2 oz (76.068 kg)   Sclerae unicteric Oropharynx clear No cervical or supraclavicular adenopathy Lungs clear to auscultation, no wheezes, no rales or rhonchi Heart regular rate and rhythm, no murmur appreciated Abdomen soft, positive bowel sounds, nontender to palpation MSK no focal spinal tenderness to gentle palpation. No peripheral edema Neuro: nonfocal, well oriented, positive affect Breasts: Deferred; both axillae are benign with no palpable adenopathy     LAB RESULTS:   Lab Results  Component Value Date   WBC 4.4 11/05/2012   NEUTROABS 2.7 11/05/2012   HGB 14.4 11/05/2012   HCT 43.2 11/05/2012   MCV 88.5 11/05/2012   PLT 200 11/05/2012      Chemistry      Component Value Date/Time   NA 143 10/25/2012 0826   NA 143  12/01/2011 0836   K 5.0 10/25/2012 0826   K 4.0 12/01/2011 0836   CL 107 09/27/2012 0842   CL 105 12/01/2011 0836   CO2 26 10/25/2012 0826   CO2 29 12/01/2011 0836   BUN 16.8 10/25/2012 0826   BUN 12 12/01/2011 0836   CREATININE 0.7 10/25/2012 0826   CREATININE 0.52 12/01/2011 0836      Component Value Date/Time   CALCIUM 9.4 10/25/2012 0826   CALCIUM 8.9 12/01/2011 0836   ALKPHOS 53 10/25/2012 0826   ALKPHOS 110 12/01/2011 0836   AST 14 10/25/2012 0826   AST 15 12/01/2011 0836   ALT 20 10/25/2012 0826   ALT 16 12/01/2011 0836   BILITOT 0.24 10/25/2012 0826   BILITOT 0.3 12/01/2011 0836      STUDIES:  Dg Hip Complete Left  10/11/2012   *RADIOLOGY REPORT*  Clinical Data: Left hip pain.  History of multiple myeloma.  LEFT HIP - COMPLETE 2+ VIEW  Comparison: Bone  survey 08/26/2011.  Findings: The bones are demineralized with questionable scattered lucencies, unchanged from the prior study.  There is no evidence of pathologic fracture.  Mild degenerative changes of both hips and sacroiliac joints are stable.  There are stable degenerative changes in the lower lumbar spine.  IMPRESSION: Radiographically stable appearance of the pelvis and left hip with questionable small lucent lesions as before.  No evidence of pathologic fracture.   Original Report Authenticated By: Carey Bullocks, M.D.   Mm Digital Screening  10/04/2012   *RADIOLOGY REPORT*  Clinical Data: Screening.  DIGITAL SCREENING BILATERAL MAMMOGRAM WITH CAD  Comparison:  None.  FINDINGS:  ACR Breast Density Category b: There is a scattered fibroglandular pattern.  There are no findings suspicious for malignancy.  Images were processed with CAD.  IMPRESSION: No mammographic evidence of malignancy.  A result letter of this screening mammogram will be mailed directly to the patient.  RECOMMENDATION: Screening mammogram in one year. (Code:SM-B-01Y)  BI-RADS CATEGORY 1:  Negative.   Original Report Authenticated By: Baird Lyons, M.D.    ASSESSMENT: 56  y.o.  [place] woman presenting with anemia, found to have an IgA >6g, with an M-spike of 4.5 g (and a second M-spike of 0.4g), urine IFE showing IgA-lambda and lambda light chains, creatinine 0.77, calcium 10.0, albumin 3.1, and beta-2-microglobulin 5.51; with bone survey showing multiple myeloma lesions and bone marrow biopsy 09/01/2011 showing 75% myeloma cells. 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  (2) on prophylactic coumadin, discontinued as of 12/13/2011.  (3) zolendronic acid every 28 days, 09/19/2011 to 01/05/2012 (five doses)  (4) s/p neupogen mobilization October 2013, with a collection of 4.26 x10^6 CD34 positive cells  (5) status post melphalan at 200 mg per meter squared 02/23/2012, followed by autologous stem cell transplant the following day.  (6) on 06/07/2012 started maintenance bortezomib sq at 1.3 mg/M2 Q2w, with bactrim and acyclovir prophylaxis, the plan being to contuinue this for 2 years as per the HOVON trial  (7) zolendronic acid, given monthly, and resumed June 2014  PLAN:  Phoebe is doing remarkably well, and we will continue with her current regimen, specifically subcutaneous bortezomib every other week, and monthly zoledronic acid. She is due for both agents on Monday, July 28. She received bortezomib again on August 11, and return in one month on August 25 for followup with Dr. Darnelle Catalan.  She scheduled to followup with Dr.  Sheryn Bison in November.    She voices understanding and agreement with our plan. She knows to call for any problems that may develop before the next visit.   Colan Laymon    11/05/2012

## 2012-11-08 ENCOUNTER — Ambulatory Visit (HOSPITAL_BASED_OUTPATIENT_CLINIC_OR_DEPARTMENT_OTHER): Payer: BC Managed Care – PPO

## 2012-11-08 ENCOUNTER — Other Ambulatory Visit: Payer: BC Managed Care – PPO | Admitting: Lab

## 2012-11-08 VITALS — BP 147/83 | HR 84 | Temp 98.5°F | Resp 20

## 2012-11-08 DIAGNOSIS — C9000 Multiple myeloma not having achieved remission: Secondary | ICD-10-CM

## 2012-11-08 DIAGNOSIS — Z5112 Encounter for antineoplastic immunotherapy: Secondary | ICD-10-CM

## 2012-11-08 MED ORDER — BORTEZOMIB CHEMO SQ INJECTION 3.5 MG (2.5MG/ML)
1.3000 mg/m2 | Freq: Once | INTRAMUSCULAR | Status: AC
  Administered 2012-11-08: 2.25 mg via SUBCUTANEOUS
  Filled 2012-11-08: qty 2.25

## 2012-11-08 MED ORDER — ZOLEDRONIC ACID 4 MG/5ML IV CONC
4.0000 mg | Freq: Once | INTRAVENOUS | Status: AC
  Administered 2012-11-08: 4 mg via INTRAVENOUS
  Filled 2012-11-08: qty 5

## 2012-11-08 MED ORDER — ONDANSETRON HCL 8 MG PO TABS
8.0000 mg | ORAL_TABLET | Freq: Once | ORAL | Status: AC
  Administered 2012-11-08: 8 mg via ORAL

## 2012-11-08 MED ORDER — SODIUM CHLORIDE 0.9 % IV SOLN: Freq: Once | INTRAVENOUS | Status: DC

## 2012-11-09 LAB — PROTEIN ELECTROPHORESIS, SERUM
Alpha-1-Globulin: 4.2 % (ref 2.9–4.9)
Alpha-2-Globulin: 11.7 % (ref 7.1–11.8)
Beta Globulin: 6.5 % (ref 4.7–7.2)
Total Protein, Serum Electrophoresis: 5.7 g/dL — ABNORMAL LOW (ref 6.0–8.3)

## 2012-11-09 LAB — KAPPA/LAMBDA LIGHT CHAINS: Kappa:Lambda Ratio: 0.42 (ref 0.26–1.65)

## 2012-11-22 ENCOUNTER — Other Ambulatory Visit (HOSPITAL_BASED_OUTPATIENT_CLINIC_OR_DEPARTMENT_OTHER): Payer: BC Managed Care – PPO | Admitting: Lab

## 2012-11-22 ENCOUNTER — Ambulatory Visit (HOSPITAL_BASED_OUTPATIENT_CLINIC_OR_DEPARTMENT_OTHER): Payer: BC Managed Care – PPO

## 2012-11-22 VITALS — BP 146/78 | HR 84 | Temp 98.4°F | Resp 16

## 2012-11-22 DIAGNOSIS — C9000 Multiple myeloma not having achieved remission: Secondary | ICD-10-CM

## 2012-11-22 DIAGNOSIS — Z5112 Encounter for antineoplastic immunotherapy: Secondary | ICD-10-CM

## 2012-11-22 LAB — CBC WITH DIFFERENTIAL/PLATELET
BASO%: 0.7 % (ref 0.0–2.0)
EOS%: 2.7 % (ref 0.0–7.0)
Eosinophils Absolute: 0.1 10*3/uL (ref 0.0–0.5)
LYMPH%: 27.6 % (ref 14.0–49.7)
MCH: 29.7 pg (ref 25.1–34.0)
MCHC: 33.1 g/dL (ref 31.5–36.0)
MCV: 89.8 fL (ref 79.5–101.0)
MONO%: 8.6 % (ref 0.0–14.0)
Platelets: 208 10*3/uL (ref 145–400)
RBC: 4.77 10*6/uL (ref 3.70–5.45)
RDW: 14.7 % — ABNORMAL HIGH (ref 11.2–14.5)

## 2012-11-22 LAB — COMPREHENSIVE METABOLIC PANEL (CC13)
AST: 19 U/L (ref 5–34)
Albumin: 3.5 g/dL (ref 3.5–5.0)
Alkaline Phosphatase: 53 U/L (ref 40–150)
Glucose: 81 mg/dl (ref 70–140)
Potassium: 4.6 mEq/L (ref 3.5–5.1)
Sodium: 143 mEq/L (ref 136–145)
Total Bilirubin: <0.2 mg/dL (ref 0.20–1.20)
Total Protein: 7 g/dL (ref 6.4–8.3)

## 2012-11-22 MED ORDER — BORTEZOMIB CHEMO SQ INJECTION 3.5 MG (2.5MG/ML)
1.3000 mg/m2 | Freq: Once | INTRAMUSCULAR | Status: AC
  Administered 2012-11-22: 2.25 mg via SUBCUTANEOUS
  Filled 2012-11-22: qty 2.25

## 2012-11-22 MED ORDER — ONDANSETRON HCL 8 MG PO TABS
8.0000 mg | ORAL_TABLET | Freq: Once | ORAL | Status: AC
  Administered 2012-11-22: 8 mg via ORAL

## 2012-11-22 NOTE — Patient Instructions (Addendum)
[facility] Discharge Instructions for Patients Receiving Chemotherapy  Today you received the following chemotherapy agents VELCADE SQ  To help prevent nausea and vomiting after your treatment, we encourage you to take your nausea medication IF NEEDED.   If you develop nausea and vomiting that is not controlled by your nausea medication, call the clinic.   BELOW ARE SYMPTOMS THAT SHOULD BE REPORTED IMMEDIATELY:  *FEVER GREATER THAN 100.5 F  *CHILLS WITH OR WITHOUT FEVER  NAUSEA AND VOMITING THAT IS NOT CONTROLLED WITH YOUR NAUSEA MEDICATION  *UNUSUAL SHORTNESS OF BREATH  *UNUSUAL BRUISING OR BLEEDING  TENDERNESS IN MOUTH AND THROAT WITH OR WITHOUT PRESENCE OF ULCERS  *URINARY PROBLEMS  *BOWEL PROBLEMS  UNUSUAL RASH Items with * indicate a potential emergency and should be followed up as soon as possible.  Feel free to call the clinic you have any questions or concerns. The clinic phone number is [phone].    

## 2012-12-06 ENCOUNTER — Telehealth: Payer: Self-pay | Admitting: *Deleted

## 2012-12-06 ENCOUNTER — Other Ambulatory Visit (HOSPITAL_BASED_OUTPATIENT_CLINIC_OR_DEPARTMENT_OTHER): Payer: BC Managed Care – PPO

## 2012-12-06 ENCOUNTER — Ambulatory Visit (HOSPITAL_BASED_OUTPATIENT_CLINIC_OR_DEPARTMENT_OTHER): Payer: BC Managed Care – PPO

## 2012-12-06 ENCOUNTER — Ambulatory Visit (HOSPITAL_BASED_OUTPATIENT_CLINIC_OR_DEPARTMENT_OTHER): Payer: BC Managed Care – PPO | Admitting: Oncology

## 2012-12-06 VITALS — BP 152/79 | HR 80 | Temp 99.0°F | Resp 20 | Ht 62.0 in | Wt 170.1 lb

## 2012-12-06 DIAGNOSIS — C9000 Multiple myeloma not having achieved remission: Secondary | ICD-10-CM

## 2012-12-06 DIAGNOSIS — Z5112 Encounter for antineoplastic immunotherapy: Secondary | ICD-10-CM

## 2012-12-06 LAB — COMPREHENSIVE METABOLIC PANEL (CC13)
Albumin: 3.8 g/dL (ref 3.5–5.0)
BUN: 11.8 mg/dL (ref 7.0–26.0)
CO2: 20 mEq/L — ABNORMAL LOW (ref 22–29)
Calcium: 9.6 mg/dL (ref 8.4–10.4)
Glucose: 80 mg/dl (ref 70–140)
Potassium: 4.4 mEq/L (ref 3.5–5.1)
Sodium: 141 mEq/L (ref 136–145)
Total Protein: 7.8 g/dL (ref 6.4–8.3)

## 2012-12-06 LAB — CBC WITH DIFFERENTIAL/PLATELET
Basophils Absolute: 0 10*3/uL (ref 0.0–0.1)
EOS%: 1.1 % (ref 0.0–7.0)
Eosinophils Absolute: 0.1 10*3/uL (ref 0.0–0.5)
HGB: 15.3 g/dL (ref 11.6–15.9)
LYMPH%: 27.2 % (ref 14.0–49.7)
MCH: 29.6 pg (ref 25.1–34.0)
MCV: 88.8 fL (ref 79.5–101.0)
MONO%: 10 % (ref 0.0–14.0)
NEUT#: 3.4 10*3/uL (ref 1.5–6.5)
Platelets: 192 10*3/uL (ref 145–400)
RBC: 5.17 10*6/uL (ref 3.70–5.45)

## 2012-12-06 MED ORDER — BORTEZOMIB CHEMO SQ INJECTION 3.5 MG (2.5MG/ML)
1.3000 mg/m2 | Freq: Once | INTRAMUSCULAR | Status: AC
  Administered 2012-12-06: 2.25 mg via SUBCUTANEOUS
  Filled 2012-12-06: qty 2.25

## 2012-12-06 MED ORDER — ONDANSETRON HCL 8 MG PO TABS
8.0000 mg | ORAL_TABLET | Freq: Once | ORAL | Status: AC
  Administered 2012-12-06: 8 mg via ORAL

## 2012-12-06 MED ORDER — SODIUM CHLORIDE 0.9 % IJ SOLN
3.0000 mL | Freq: Once | INTRAMUSCULAR | Status: DC | PRN
  Filled 2012-12-06: qty 10

## 2012-12-06 NOTE — Progress Notes (Signed)
Patient asked for IV start to antecubital.  Scar tissue to antecubital area.  This nurse unable to obtain IV.  Charge nurse attempted x 1 and third nurse attempted x 2.  Patient does not want another attempt at IV start today.  Scheduled to return on 12-10-2012 for zometa infusion.  Message left for collaborative nurse to notify provider.  [facility] with [facility].

## 2012-12-06 NOTE — Telephone Encounter (Signed)
appts made and printed. Pt is aware that tx's will follow after her labs. i emailed MW to add tx's. Pt is also aware that i emailed [facility] and [facility] for a time slot on 10.6.14....td

## 2012-12-06 NOTE — Telephone Encounter (Signed)
Per staff message and POF I have scheduled appts. Waiting on MD visit for October JMW

## 2012-12-06 NOTE — Telephone Encounter (Signed)
Pt stated that she does not need an appt on 9/19 due to [facility] that her that she was going so good that she can skip Sept ov...td

## 2012-12-06 NOTE — Patient Instructions (Signed)
[facility]charge [facility] for Patients Receiving Chemotherapy  Today you received the following chemotherapy agents Velcade  To help prevent nausea and vomiting after your treatment, we encourage you to take your nausea medication Other prilosec as ordered   If you develop nausea and vomiting that is not controlled by your nausea medication, call the clinic.   BELOW ARE SYMPTOMS THAT SHOULD BE REPORTED IMMEDIATELY:  *FEVER GREATER THAN 100.5 F  *CHILLS WITH OR WITHOUT FEVER  NAUSEA AND VOMITING THAT IS NOT CONTROLLED WITH YOUR NAUSEA MEDICATION  *UNUSUAL SHORTNESS OF BREATH  *UNUSUAL BRUISING OR BLEEDING  TENDERNESS IN MOUTH AND THROAT WITH OR WITHOUT PRESENCE OF ULCERS  *URINARY PROBLEMS  *BOWEL PROBLEMS  UNUSUAL RASH Items with * indicate a potential emergency and should be followed up as soon as possible.  Feel free to call the clinic you have any questions or concerns. The clinic phone number is [phone].   Bortezomib injection What is this medicine? BORTEZOMIB (bor TEZ oh mib) is a chemotherapy drug. It slows the growth of cancer cells. This medicine is used to treat multiple myeloma, lymphoma, and other cancers. This medicine may be used for other purposes; ask your health care provider or pharmacist if you have questions. What should I tell my health care provider before I take this medicine? They need to know if you have any of these conditions: -heart disease -irregular heartbeat -liver disease -low blood counts, like low white blood cells, platelets, or hemoglobin -peripheral neuropathy -taking medicine for blood pressure -an unusual or allergic reaction to bortezomib, mannitol, boron, other medicines, foods, dyes, or preservatives -pregnant or trying to get pregnant -breast-feeding How should I use this medicine? This medicine is for injection into a vein or for injection under the skin. It is given by a health care professional  in a hospital or clinic setting. Talk to your pediatrician regarding the use of this medicine in children. Special care may be needed. Overdosage: If you think you have taken too much of this medicine contact a poison control center or emergency room at once. NOTE: This medicine is only for you. Do not share this medicine with others. What if I miss a dose? It is important not to miss your dose. Call your doctor or health care professional if you are unable to keep an appointment. What may interact with this medicine? -medicines for diabetes -medicines to increase blood counts like filgrastim, pegfilgrastim, sargramostim -zalcitabine Talk to your doctor or health care professional before taking any of these medicines: -acetaminophen -aspirin -ibuprofen -ketoprofen -naproxen This list may not describe all possible interactions. Give your health care provider a list of all the medicines, herbs, non-prescription drugs, or dietary supplements you use. Also tell them if you smoke, drink alcohol, or use illegal drugs. Some items may interact with your medicine. What should I watch for while using this medicine? Visit your doctor for checks on your progress. This drug may make you feel generally unwell. This is not uncommon, as chemotherapy can affect healthy cells as well as cancer cells. Report any side effects. Continue your course of treatment even though you feel ill unless your doctor tells you to stop. You may get drowsy or dizzy. Do not drive, use machinery, or do anything that needs mental alertness until you know how this medicine affects you. Do not stand or sit up quickly, especially if you are an older patient. This reduces the risk of dizzy or fainting spells. In some cases,  you may be given additional medicines to help with side effects. Follow all directions for their use. Call your doctor or health care professional for advice if you get a fever, chills or sore throat, or other  symptoms of a cold or flu. Do not treat yourself. This drug decreases your body's ability to fight infections. Try to avoid being around people who are sick. This medicine may increase your risk to bruise or bleed. Call your doctor or health care professional if you notice any unusual bleeding. Be careful brushing and flossing your teeth or using a toothpick because you may get an infection or bleed more easily. If you have any dental work done, tell your dentist you are receiving this medicine. Avoid taking products that contain aspirin, acetaminophen, ibuprofen, naproxen, or ketoprofen unless instructed by your doctor. These medicines may hide a fever. Do not become pregnant while taking this medicine. Women should inform their doctor if they wish to become pregnant or think they might be pregnant. There is a potential for serious side effects to an unborn child. Talk to your health care professional or pharmacist for more information. Do not breast-feed an infant while taking this medicine. You may have vomiting or diarrhea while taking this medicine. Drink water or other fluids as directed. What side effects may I notice from receiving this medicine? Side effects that you should report to your doctor or health care professional as soon as possible: -allergic reactions like skin rash, itching or hives, swelling of the face, lips, or tongue -breathing problems -changes in hearing -changes in vision -fast, irregular heartbeat -feeling faint or lightheaded, falls -pain, tingling, numbness in the hands or feet -seizures -swelling of the ankles, feet, hands -unusual bleeding or bruising -unusually weak or tired -vomiting Side effects that usually do not require medical attention (report to your doctor or health care professional if they continue or are bothersome): -changes in emotions or moods -constipation -diarrhea -loss of appetite -headache -irritation at site where  injected -nausea This list may not describe all possible side effects. Call your doctor for medical advice about side effects. You may report side effects to FDA at 1-800-FDA-1088. Where should I keep my medicine? This drug is given in a hospital or clinic and will not be stored at home. NOTE: This sheet is a summary. It may not cover all possible information. If you have questions about this medicine, talk to your doctor, pharmacist, or health care provider.  2013, [employer]. (05/08/2010 11:42:36 AM)

## 2012-12-06 NOTE — Progress Notes (Signed)
ID: Laura Hester   DOB: May 14, 1955  MR#: [id number]  CSN#:[id number]  PCP: Sissy Hoff, MD GYN: Genia Del SU: OTHER MD: Armandina Stammer  HISTORY OF PRESENT ILLNESS: Laura Hester has a history of iron deficiency anemia secondary to menorrhagia. However, as this persisted despite near-total cessation of her menses and despite iron supplementation, she was referred to Dr. Dorena Cookey for GI evaluation. He found the patient's Hb to have decreased from 9.17 June 2011 to 8.3 07/29/2011. MCV was 95.9, ferritin 93.3, with a normal folate and negative ANA. Guaiacs were negative. Creatinine was 0.77.  t-transglutaminase IgA was normal at 2, but the total IgA was 6.9 g. Accordingly on 08/04/2011 an SPEP was obtained, showing an M-spike of 4.5 g, with a secondary M-spike of 0.4 g. Total protein was 10.0 with albumin 3.2, calcium 10.2. Bone survey 08/26/2011 showed multiple bone lesions consistent with myeloma and bone marrow biopsy 09/01/2011 confirmed the diagnosis with 75% myeloma cells in the marrow. [name]' subsequent history is detailed below.  INTERVAL HISTORY: Laura Hester returns today for followup of her multiple myeloma accompanied by her husband [name]. She continues to receive maintenance subcutaneous bortezomib on an every 2 week basis, and zoledronic acid on a monthly basis. She feels "great, the best I felt in 2 years". They're getting the house remodel. She is staying away from all the dust. She is exercising regularly and drinking for large glasses of water daily.Marland Kitchen  REVIEW OF SYSTEMS: Simaya denies any peripheral neuropathy, unusual headaches, visual changes, mouth sores or difficulty swallowing, cough, phlegm production, pleurisy, or change in bowel or bladder habits. There is no pain. She gets some bruising with the subcutaneous bortezomib shots and she does not like getting stuck of course for the lab work and zoledronic acid (we will coordinate that so she gets both at the same time is  that of getting stuck twice). Otherwise a detailed review of systems was noncontributory  PAST MEDICAL HISTORY: Past Medical History  Diagnosis Date  . Fibrocystic breast disease   . Allergic rhinitis   . HTN (hypertension)     mild/ observation only  . Prediabetes   . SUI (stress urinary incontinence, female)   . Overweight (BMI 25.0-29.9)   . Multiple myeloma, stage 3 08/20/11 dx  . Anemia     resolved    PAST SURGICAL HISTORY: Past Surgical History  Procedure Laterality Date  . Cholecystectomy  1995  . Tonsillectomy and adenoidectomy    . Carpal tunnel release      FAMILY HISTORY Family History  Problem Relation Age of Onset  . Heart disease Father   . Breast cancer Mother   . Heart disease Mother   . Diabetes Paternal Grandfather   . Hypertension Maternal Grandmother   . Hypertension Brother   The patient's parents are in their mid-70s.Her mother has a remote history of breast cancer. She has two brothers, one her twin; no sisters. There is no other history of cancer in the family to her knowledge  GYNECOLOGIC HISTORY: Menarche age 40, first live birth age 51, GX P14. Last period was January 2013, before that May 2012.  SOCIAL HISTORY: She is a Software engineer and taught kindergarten until 2012. Her husband [name], graduated from [employer] and works in Regulatory affairs officer.  Daughter [name] works for [employer], married in 2013. Daughter [name] studies works in [place] and comes home every other week and The patient attends the [employer]   ADVANCED DIRECTIVES: Not in place  HEALTH MAINTENANCE: History  Substance Use Topics  . Smoking status: Never Smoker   . Smokeless tobacco: Never Used  . Alcohol Use: No     Colonoscopy: 2009/[facility]  PAP: March 2014  Bone density: never  Lipid panel: [place]/ "excellent"  Mammography: June of 2014  Allergies  Allergen Reactions  . Wellbutrin [Bupropion Hcl] Hives    Current Outpatient  Prescriptions  Medication Sig Dispense Refill  . omeprazole (PRILOSEC) 20 MG capsule Take 1 capsule (20 mg total) by mouth daily.  30 capsule  12  . potassium chloride SA (K-DUR,KLOR-CON) 20 MEQ tablet Take 1 tablet (20 mEq total) by mouth daily.  90 tablet  1  . sulfamethoxazole-trimethoprim (BACTRIM DS,SEPTRA DS) 800-160 MG per tablet Take 1 tablet by mouth 2 (two) times a week.  60 tablet  12  . valACYclovir (VALTREX) 500 MG tablet Take 1 tablet (500 mg total) by mouth daily.  30 tablet  12   No current facility-administered medications for this visit.   OBJECTIVE: Middle-aged white woman in no acute distress Filed Vitals:   12/06/12 1011  BP: 152/79  Pulse: 80  Temp: 99 F (37.2 C)  Resp: 20     Body mass index is 31.1 kg/(m^2).    ECOG FS: 0  Filed Weights   12/06/12 1011  Weight: 170 lb 1.6 oz (77.157 kg)   Sclerae unicteric, pupils equal round and reactive to light Oropharynx clear No cervical or supraclavicular adenopathy Lungs clear bilaterally Heart regular rate and rhythm, no murmur appreciated Abdomen soft, positive bowel sounds, nontender, no masses palpated MSK no focal spinal tenderness  No peripheral edema Neuro: nonfocal, well oriented, positive affect Breasts: Deferred; both axillae are benign    LAB RESULTS: Labs 11/05/2012 showed no detectable M. spike, 0.76, lambda a 1.82, ratio 0.42. These labs are being repeated today.  Lab Results  Component Value Date   WBC 5.5 12/06/2012   NEUTROABS 3.4 12/06/2012   HGB 15.3 12/06/2012   HCT 45.9 12/06/2012   MCV 88.8 12/06/2012   PLT 192 12/06/2012      Chemistry      Component Value Date/Time   NA 143 11/22/2012 0852   NA 143 12/01/2011 0836   K 4.6 11/22/2012 0852   K 4.0 12/01/2011 0836   CL 107 09/27/2012 0842   CL 105 12/01/2011 0836   CO2 22 11/22/2012 0852   CO2 29 12/01/2011 0836   BUN 12.4 11/22/2012 0852   BUN 12 12/01/2011 0836   CREATININE 0.7 11/22/2012 0852   CREATININE 0.52 12/01/2011 0836       Component Value Date/Time   CALCIUM 8.6 11/22/2012 0852   CALCIUM 8.9 12/01/2011 0836   ALKPHOS 53 11/22/2012 0852   ALKPHOS 110 12/01/2011 0836   AST 19 11/22/2012 0852   AST 15 12/01/2011 0836   ALT 17 11/22/2012 0852   ALT 16 12/01/2011 0836   BILITOT <0.20 11/22/2012 0852   BILITOT 0.3 12/01/2011 0836      STUDIES: No results found.  ASSESSMENT: 57 y.o.  [place] woman presenting with anemia, found to have an IgA >6g, with an M-spike of 4.5 g (and a second M-spike of 0.4g), urine IFE showing IgA-lambda and lambda light chains, creatinine 0.77, calcium 10.0, albumin 3.1, and beta-2-microglobulin 5.51; with bone survey showing multiple myeloma lesions and bone marrow biopsy 09/01/2011 showing 75% myeloma cells. 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  (2) on prophylactic coumadin, discontinued as of 12/13/2011.  (3) zolendronic acid every 28 days, 09/19/2011 to 01/05/2012 (five doses)  (4) s/p neupogen mobilization October 2013, with a collection of 4.26 x10^6 CD34 positive cells  (5) status post melphalan at 200 mg/M2 on 02/23/2012, followed by autologous stem cell transplant the following day.  (6) on 06/07/2012 started maintenance bortezomib sq at 1.3 mg/M2 Q2w, with bactrim and acyclovir prophylaxis, the plan being to contuinue this for 2 years as per the HOVON trial  (7) zolendronic acid, given monthly, and resumed June 2014  PLAN:  Damyra is doing fine from a myeloma point of view, tolerating her treatment without any peripheral neuropathy or other side effects beyond a minimal discomfort and bruising of his subcutaneous shots themselves. We are going to continue the bortezomib every 2 weeks and is alendronate every 4 weeks, and she will see [place] again  in early October for routine followup. Her next visit we'll then be with Dr. Sheryn Bison November 20 or so. She will see me December 15 for further followup.  She knows to call for any problems that may develop before her next visit here.   Cleo Santucci C    12/06/2012

## 2012-12-06 NOTE — Telephone Encounter (Signed)
Pt is aware of her appt w/ [facility] on 01/31/13...td

## 2012-12-06 NOTE — Progress Notes (Signed)
Discharged at 12:50 pm to go home after schedule revised by scheduler.  Reports she is not to see a provider until October because she is doing so well.

## 2012-12-06 NOTE — Telephone Encounter (Signed)
appts made and printed...td 

## 2012-12-06 NOTE — Telephone Encounter (Signed)
Per staff message and POF I have scheduled appts.  JMW  

## 2012-12-07 NOTE — Addendum Note (Signed)
Addended by: Billey Co on: 12/07/2012 09:12 AM   Modules accepted: Medications

## 2012-12-08 LAB — PROTEIN ELECTROPHORESIS, SERUM
Albumin ELP: 58.9 % (ref 55.8–66.1)
Alpha-2-Globulin: 12.3 % — ABNORMAL HIGH (ref 7.1–11.8)
Beta 2: 6.4 % (ref 3.2–6.5)
Beta Globulin: 7.2 % (ref 4.7–7.2)
Total Protein, Serum Electrophoresis: 7.3 g/dL (ref 6.0–8.3)

## 2012-12-09 ENCOUNTER — Ambulatory Visit (HOSPITAL_BASED_OUTPATIENT_CLINIC_OR_DEPARTMENT_OTHER): Payer: BC Managed Care – PPO

## 2012-12-09 VITALS — BP 134/76 | HR 95 | Temp 98.8°F | Resp 20

## 2012-12-09 DIAGNOSIS — C9000 Multiple myeloma not having achieved remission: Secondary | ICD-10-CM

## 2012-12-09 MED ORDER — HEPARIN SOD (PORK) LOCK FLUSH 100 UNIT/ML IV SOLN
250.0000 [IU] | Freq: Once | INTRAVENOUS | Status: DC | PRN
  Filled 2012-12-09: qty 5

## 2012-12-09 MED ORDER — ALTEPLASE 2 MG IJ SOLR
2.0000 mg | Freq: Once | INTRAMUSCULAR | Status: DC | PRN
  Filled 2012-12-09: qty 2

## 2012-12-09 MED ORDER — ZOLEDRONIC ACID 4 MG/100ML IV SOLN
4.0000 mg | Freq: Once | INTRAVENOUS | Status: AC
  Administered 2012-12-09: 4 mg via INTRAVENOUS
  Filled 2012-12-09: qty 100

## 2012-12-09 MED ORDER — SODIUM CHLORIDE 0.9 % IJ SOLN
10.0000 mL | INTRAMUSCULAR | Status: DC | PRN
  Filled 2012-12-09: qty 10

## 2012-12-09 MED ORDER — HEPARIN SOD (PORK) LOCK FLUSH 100 UNIT/ML IV SOLN
500.0000 [IU] | Freq: Once | INTRAVENOUS | Status: DC | PRN
  Filled 2012-12-09: qty 5

## 2012-12-09 NOTE — Patient Instructions (Signed)
Zoledronic Acid injection (Hypercalcemia, Oncology) What is this medicine? ZOLEDRONIC ACID (ZOE le dron ik AS id) lowers the amount of calcium loss from bone. It is used to treat too much calcium in your blood from cancer. It is also used to prevent complications of cancer that has spread to the bone. This medicine may be used for other purposes; ask your health care provider or pharmacist if you have questions. What should I tell my health care provider before I take this medicine? They need to know if you have any of these conditions: -aspirin-sensitive asthma -dental disease -kidney disease -an unusual or allergic reaction to zoledronic acid, other medicines, foods, dyes, or preservatives -pregnant or trying to get pregnant -breast-feeding How should I use this medicine? This medicine is for infusion into a vein. It is given by a health care professional in a hospital or clinic setting. Talk to your pediatrician regarding the use of this medicine in children. Special care may be needed. Overdosage: If you think you have taken too much of this medicine contact a poison control center or emergency room at once. NOTE: This medicine is only for you. Do not share this medicine with others. What if I miss a dose? It is important not to miss your dose. Call your doctor or health care professional if you are unable to keep an appointment. What may interact with this medicine? -certain antibiotics given by injection -NSAIDs, medicines for pain and inflammation, like ibuprofen or naproxen -some diuretics like bumetanide, furosemide -teriparatide -thalidomide This list may not describe all possible interactions. Give your health care provider a list of all the medicines, herbs, non-prescription drugs, or dietary supplements you use. Also tell them if you smoke, drink alcohol, or use illegal drugs. Some items may interact with your medicine. What should I watch for while using this medicine? Visit  your doctor or health care professional for regular checkups. It may be some time before you see the benefit from this medicine. Do not stop taking your medicine unless your doctor tells you to. Your doctor may order blood tests or other tests to see how you are doing. Women should inform their doctor if they wish to become pregnant or think they might be pregnant. There is a potential for serious side effects to an unborn child. Talk to your health care professional or pharmacist for more information. You should make sure that you get enough calcium and vitamin D while you are taking this medicine. Discuss the foods you eat and the vitamins you take with your health care professional. Some people who take this medicine have severe bone, joint, and/or muscle pain. This medicine may also increase your risk for a broken thigh bone. Tell your doctor right away if you have pain in your upper leg or groin. Tell your doctor if you have any pain that does not go away or that gets worse. What side effects may I notice from receiving this medicine? Side effects that you should report to your doctor or health care professional as soon as possible: -allergic reactions like skin rash, itching or hives, swelling of the face, lips, or tongue -anxiety, confusion, or depression -breathing problems -changes in vision -feeling faint or lightheaded, falls -jaw burning, cramping, pain -muscle cramps, stiffness, or weakness -trouble passing urine or change in the amount of urine Side effects that usually do not require medical attention (report to your doctor or health care professional if they continue or are bothersome): -bone, joint, or muscle pain -  fever -hair loss -irritation at site where injected -loss of appetite -nausea, vomiting -stomach upset -tired This list may not describe all possible side effects. Call your doctor for medical advice about side effects. You may report side effects to FDA at  1-800-FDA-1088. Where should I keep my medicine? This drug is given in a hospital or clinic and will not be stored at home. NOTE: This sheet is a summary. It may not cover all possible information. If you have questions about this medicine, talk to your doctor, pharmacist, or health care provider.  2012, Elsevier/Gold Standard. (09/27/2010 9:06:58 AM) 

## 2012-12-20 ENCOUNTER — Ambulatory Visit (HOSPITAL_BASED_OUTPATIENT_CLINIC_OR_DEPARTMENT_OTHER): Payer: BC Managed Care – PPO

## 2012-12-20 ENCOUNTER — Other Ambulatory Visit (HOSPITAL_BASED_OUTPATIENT_CLINIC_OR_DEPARTMENT_OTHER): Payer: BC Managed Care – PPO

## 2012-12-20 VITALS — BP 144/73 | HR 94 | Temp 98.2°F

## 2012-12-20 DIAGNOSIS — C9000 Multiple myeloma not having achieved remission: Secondary | ICD-10-CM

## 2012-12-20 DIAGNOSIS — Z5112 Encounter for antineoplastic immunotherapy: Secondary | ICD-10-CM

## 2012-12-20 LAB — CBC WITH DIFFERENTIAL/PLATELET
EOS%: 1.4 % (ref 0.0–7.0)
Eosinophils Absolute: 0.1 10*3/uL (ref 0.0–0.5)
LYMPH%: 19.5 % (ref 14.0–49.7)
MCH: 29.7 pg (ref 25.1–34.0)
MCHC: 33.2 g/dL (ref 31.5–36.0)
MCV: 89.6 fL (ref 79.5–101.0)
MONO%: 8.7 % (ref 0.0–14.0)
NEUT#: 4.6 10*3/uL (ref 1.5–6.5)
Platelets: 223 10*3/uL (ref 145–400)
RBC: 4.72 10*6/uL (ref 3.70–5.45)

## 2012-12-20 LAB — COMPREHENSIVE METABOLIC PANEL (CC13)
Alkaline Phosphatase: 59 U/L (ref 40–150)
Glucose: 71 mg/dl (ref 70–140)
Sodium: 145 mEq/L (ref 136–145)
Total Bilirubin: 0.27 mg/dL (ref 0.20–1.20)
Total Protein: 7.3 g/dL (ref 6.4–8.3)

## 2012-12-20 MED ORDER — ONDANSETRON HCL 8 MG PO TABS
8.0000 mg | ORAL_TABLET | Freq: Once | ORAL | Status: AC
  Administered 2012-12-20: 8 mg via ORAL

## 2012-12-20 MED ORDER — ONDANSETRON HCL 8 MG PO TABS
ORAL_TABLET | ORAL | Status: AC
  Filled 2012-12-20: qty 1

## 2012-12-20 MED ORDER — BORTEZOMIB CHEMO SQ INJECTION 3.5 MG (2.5MG/ML)
1.3000 mg/m2 | Freq: Once | INTRAMUSCULAR | Status: AC
  Administered 2012-12-20: 2.25 mg via SUBCUTANEOUS
  Filled 2012-12-20: qty 2.25

## 2012-12-20 NOTE — Patient Instructions (Signed)
[facility] Discharge Instructions for Patients Receiving Chemotherapy  Today you received the following chemotherapy agents: Velcade.  To help prevent nausea and vomiting after your treatment, we encourage you to take your nausea medication as prescribed.   If you develop nausea and vomiting that is not controlled by your nausea medication, call the clinic.   BELOW ARE SYMPTOMS THAT SHOULD BE REPORTED IMMEDIATELY:  *FEVER GREATER THAN 100.5 F  *CHILLS WITH OR WITHOUT FEVER  NAUSEA AND VOMITING THAT IS NOT CONTROLLED WITH YOUR NAUSEA MEDICATION  *UNUSUAL SHORTNESS OF BREATH  *UNUSUAL BRUISING OR BLEEDING  TENDERNESS IN MOUTH AND THROAT WITH OR WITHOUT PRESENCE OF ULCERS  *URINARY PROBLEMS  *BOWEL PROBLEMS  UNUSUAL RASH Items with * indicate a potential emergency and should be followed up as soon as possible.  Feel free to call the clinic you have any questions or concerns. The clinic phone number is [phone].    

## 2012-12-31 ENCOUNTER — Other Ambulatory Visit: Payer: BC Managed Care – PPO | Admitting: Lab

## 2012-12-31 ENCOUNTER — Ambulatory Visit: Payer: BC Managed Care – PPO | Admitting: Physician Assistant

## 2013-01-03 ENCOUNTER — Ambulatory Visit: Payer: BC Managed Care – PPO | Admitting: Lab

## 2013-01-03 ENCOUNTER — Other Ambulatory Visit (HOSPITAL_BASED_OUTPATIENT_CLINIC_OR_DEPARTMENT_OTHER): Payer: BC Managed Care – PPO | Admitting: Lab

## 2013-01-03 ENCOUNTER — Ambulatory Visit (HOSPITAL_BASED_OUTPATIENT_CLINIC_OR_DEPARTMENT_OTHER): Payer: BC Managed Care – PPO

## 2013-01-03 ENCOUNTER — Other Ambulatory Visit: Payer: BC Managed Care – PPO | Admitting: Lab

## 2013-01-03 ENCOUNTER — Other Ambulatory Visit: Payer: Self-pay | Admitting: Oncology

## 2013-01-03 VITALS — BP 123/75 | HR 97 | Temp 98.8°F | Resp 20

## 2013-01-03 DIAGNOSIS — C9000 Multiple myeloma not having achieved remission: Secondary | ICD-10-CM

## 2013-01-03 DIAGNOSIS — Z5112 Encounter for antineoplastic immunotherapy: Secondary | ICD-10-CM

## 2013-01-03 LAB — CBC WITH DIFFERENTIAL/PLATELET
BASO%: 0.3 % (ref 0.0–2.0)
MCHC: 32.5 g/dL (ref 31.5–36.0)
MONO#: 0.6 10*3/uL (ref 0.1–0.9)
RBC: 4.96 10*6/uL (ref 3.70–5.45)
WBC: 5.7 10*3/uL (ref 3.9–10.3)
lymph#: 1.3 10*3/uL (ref 0.9–3.3)
nRBC: 0 % (ref 0–0)

## 2013-01-03 LAB — COMPREHENSIVE METABOLIC PANEL (CC13)
Albumin: 3.3 g/dL — ABNORMAL LOW (ref 3.5–5.0)
Alkaline Phosphatase: 63 U/L (ref 40–150)
BUN: 10.9 mg/dL (ref 7.0–26.0)
Glucose: 94 mg/dl (ref 70–140)
Potassium: 4 mEq/L (ref 3.5–5.1)

## 2013-01-03 LAB — PROTEIN / CREATININE RATIO, URINE: Creatinine, Urine: 28.8 mg/dL

## 2013-01-03 MED ORDER — ONDANSETRON HCL 8 MG PO TABS
8.0000 mg | ORAL_TABLET | Freq: Once | ORAL | Status: AC
  Administered 2013-01-03: 8 mg via ORAL

## 2013-01-03 MED ORDER — ONDANSETRON HCL 8 MG PO TABS
ORAL_TABLET | ORAL | Status: AC
  Filled 2013-01-03: qty 1

## 2013-01-03 MED ORDER — ZOLEDRONIC ACID 4 MG/100ML IV SOLN
4.0000 mg | Freq: Once | INTRAVENOUS | Status: AC
  Administered 2013-01-03: 4 mg via INTRAVENOUS
  Filled 2013-01-03: qty 100

## 2013-01-03 MED ORDER — ZOLEDRONIC ACID 4 MG/5ML IV CONC: 4.0000 mg | Freq: Once | INTRAVENOUS | Status: DC

## 2013-01-03 MED ORDER — BORTEZOMIB CHEMO SQ INJECTION 3.5 MG (2.5MG/ML)
1.3000 mg/m2 | Freq: Once | INTRAMUSCULAR | Status: AC
  Administered 2013-01-03: 2.25 mg via SUBCUTANEOUS
  Filled 2013-01-03: qty 2.25

## 2013-01-03 NOTE — Patient Instructions (Addendum)
[facility] Discharge Instructions for Patients Receiving Chemotherapy  Today you received the following chemotherapy agents VELCADE SQ & ZOMETA  To help prevent nausea and vomiting after your treatment, we encourage you to take your nausea medication IF NEEDED   If you develop nausea and vomiting that is not controlled by your nausea medication, call the clinic.   BELOW ARE SYMPTOMS THAT SHOULD BE REPORTED IMMEDIATELY:  *FEVER GREATER THAN 100.5 F  *CHILLS WITH OR WITHOUT FEVER  NAUSEA AND VOMITING THAT IS NOT CONTROLLED WITH YOUR NAUSEA MEDICATION  *UNUSUAL SHORTNESS OF BREATH  *UNUSUAL BRUISING OR BLEEDING  TENDERNESS IN MOUTH AND THROAT WITH OR WITHOUT PRESENCE OF ULCERS  *URINARY PROBLEMS  *BOWEL PROBLEMS  UNUSUAL RASH Items with * indicate a potential emergency and should be followed up as soon as possible.  Feel free to call the clinic you have any questions or concerns. The clinic phone number is [phone].

## 2013-01-05 LAB — PROTEIN ELECTROPHORESIS, SERUM
Alpha-2-Globulin: 11 % (ref 7.1–11.8)
Gamma Globulin: 5.3 % — ABNORMAL LOW (ref 11.1–18.8)

## 2013-01-05 LAB — KAPPA/LAMBDA LIGHT CHAINS
Kappa free light chain: 0.15 mg/dL — ABNORMAL LOW (ref 0.33–1.94)
Kappa:Lambda Ratio: 0.01 — ABNORMAL LOW (ref 0.26–1.65)
Lambda Free Lght Chn: 19.1 mg/dL — ABNORMAL HIGH (ref 0.57–2.63)

## 2013-01-07 ENCOUNTER — Other Ambulatory Visit: Payer: Self-pay | Admitting: Oncology

## 2013-01-12 ENCOUNTER — Other Ambulatory Visit: Payer: Self-pay | Admitting: Oncology

## 2013-01-13 ENCOUNTER — Other Ambulatory Visit: Payer: Self-pay | Admitting: Oncology

## 2013-01-14 ENCOUNTER — Encounter: Payer: Self-pay | Admitting: *Deleted

## 2013-01-14 ENCOUNTER — Encounter: Payer: BC Managed Care – PPO | Admitting: Oncology

## 2013-01-14 ENCOUNTER — Other Ambulatory Visit: Payer: Self-pay | Admitting: Oncology

## 2013-01-14 ENCOUNTER — Ambulatory Visit (HOSPITAL_BASED_OUTPATIENT_CLINIC_OR_DEPARTMENT_OTHER): Payer: BC Managed Care – PPO | Admitting: Oncology

## 2013-01-14 DIAGNOSIS — C9000 Multiple myeloma not having achieved remission: Secondary | ICD-10-CM

## 2013-01-14 NOTE — Progress Notes (Signed)
ID: Laura Hester   DOB: Apr 25, 1955  MR#: [id number]  CSN#:[id number]  PCP: Sissy Hoff, MD GYN: Genia Del SU: OTHER MD: Armandina Stammer  HISTORY OF PRESENT ILLNESS: Laura Hester has a history of iron deficiency anemia secondary to menorrhagia. However, as this persisted despite near-total cessation of her menses and despite iron supplementation, she was referred to Dr. Dorena Cookey for GI evaluation. He found the patient's Hb to have decreased from 9.17 June 2011 to 8.3 07/29/2011. MCV was 95.9, ferritin 93.3, with a normal folate and negative ANA. Guaiacs were negative. Creatinine was 0.77.  t-transglutaminase IgA was normal at 2, but the total IgA was 6.9 g. Accordingly on 08/04/2011 an SPEP was obtained, showing an M-spike of 4.5 g, with a secondary M-spike of 0.4 g. Total protein was 10.0 with albumin 3.2, calcium 10.2. Bone survey 08/26/2011 showed multiple bone lesions consistent with myeloma and bone marrow biopsy 09/01/2011 confirmed the diagnosis with 75% myeloma cells in the marrow. Mystie' subsequent history is detailed below.  INTERVAL HISTORY: Laura Hester returns today for followup of her multiple myeloma accompanied by her husband [name]. She continues to receive maintenance subcutaneous bortezomib on an every 2 week basis, and zoledronic acid on a monthly basis. She feels "great, the best I felt in 2 years". They're getting the house remodel. She is staying away from all the dust. She is exercising regularly and drinking for large glasses of water daily.Marland Kitchen  REVIEW OF SYSTEMS: Laura Hester denies any peripheral neuropathy, unusual headaches, visual changes, mouth sores or difficulty swallowing, cough, phlegm production, pleurisy, or change in bowel or bladder habits. There is no pain. She gets some bruising with the subcutaneous bortezomib shots and she does not like getting stuck of course for the lab work and zoledronic acid (we will coordinate that so she gets both at the same time is  that of getting stuck twice). Otherwise a detailed review of systems was noncontributory  PAST MEDICAL HISTORY: Past Medical History  Diagnosis Date  . Fibrocystic breast disease   . Allergic rhinitis   . HTN (hypertension)     mild/ observation only  . Prediabetes   . SUI (stress urinary incontinence, female)   . Overweight (BMI 25.0-29.9)   . Multiple myeloma, stage 3 08/20/11 dx  . Anemia     resolved    PAST SURGICAL HISTORY: Past Surgical History  Procedure Laterality Date  . Cholecystectomy  1995  . Tonsillectomy and adenoidectomy    . Carpal tunnel release      FAMILY HISTORY Family History  Problem Relation Age of Onset  . Heart disease Father   . Breast cancer Mother   . Heart disease Mother   . Diabetes Paternal Grandfather   . Hypertension Maternal Grandmother   . Hypertension Brother   The patient's parents are in their mid-70s.Her mother has a remote history of breast cancer. She has two brothers, one her twin; no sisters. There is no other history of cancer in the family to her knowledge  GYNECOLOGIC HISTORY: Menarche age 59, first live birth age 89, GX P31. Last period was January 2013, before that May 2012.  SOCIAL HISTORY: She is a Software engineer and taught kindergarten until 2012. Her husband [name], graduated from [employer] and works in Regulatory affairs officer.  Daughter Laura Hester works for [employer], married in 2013. Daughter Laura Hester studies works in [place] and comes home every other week and The patient attends the [employer]   ADVANCED DIRECTIVES: Not in place  HEALTH MAINTENANCE: History  Substance Use Topics  . Smoking status: Never Smoker   . Smokeless tobacco: Never Used  . Alcohol Use: No     Colonoscopy: 2009/[facility]  PAP: March 2014  Bone density: never  Lipid panel: [place]/ "excellent"  Mammography: June of 2014  Allergies  Allergen Reactions  . Wellbutrin [Bupropion Hcl] Hives    Current Outpatient  Prescriptions  Medication Sig Dispense Refill  . omeprazole (PRILOSEC) 20 MG capsule Take 1 capsule (20 mg total) by mouth daily.  30 capsule  12  . potassium chloride SA (K-DUR,KLOR-CON) 20 MEQ tablet Take 1 tablet (20 mEq total) by mouth daily.  90 tablet  1  . sulfamethoxazole-trimethoprim (BACTRIM DS,SEPTRA DS) 800-160 MG per tablet Take 1 tablet by mouth 2 (two) times a week.  60 tablet  12  . valACYclovir (VALTREX) 500 MG tablet Take 1 tablet (500 mg total) by mouth daily.  30 tablet  12   No current facility-administered medications for this visit.   OBJECTIVE: Middle-aged white woman in no acute distress There were no vitals filed for this visit.   There is no weight on file to calculate BMI.    ECOG FS: 0  There were no vitals filed for this visit. Sclerae unicteric, pupils equal round and reactive to light Oropharynx clear No cervical or supraclavicular adenopathy Lungs clear bilaterally Heart regular rate and rhythm, no murmur appreciated Abdomen soft, positive bowel sounds, nontender, no masses palpated MSK no focal spinal tenderness  No peripheral edema Neuro: nonfocal, well oriented, positive affect Breasts: Deferred; both axillae are benign    LAB RESULTS: Labs 11/05/2012 showed no detectable M. spike, 0.76, lambda a 1.82, ratio 0.42. These labs are being repeated today.  Lab Results  Component Value Date   WBC 5.7 01/03/2013   NEUTROABS 3.7 01/03/2013   HGB 14.5 01/03/2013   HCT 44.6 01/03/2013   MCV 89.9 01/03/2013   PLT 228 01/03/2013      Chemistry      Component Value Date/Time   NA 145 01/03/2013 0900   NA 143 12/01/2011 0836   K 4.0 01/03/2013 0900   K 4.0 12/01/2011 0836   CL 107 09/27/2012 0842   CL 105 12/01/2011 0836   CO2 25 01/03/2013 0900   CO2 29 12/01/2011 0836   BUN 10.9 01/03/2013 0900   BUN 12 12/01/2011 0836   CREATININE 0.7 01/03/2013 0900   CREATININE 0.52 12/01/2011 0836      Component Value Date/Time   CALCIUM 9.8 01/03/2013 0900   CALCIUM  8.9 12/01/2011 0836   ALKPHOS 63 01/03/2013 0900   ALKPHOS 110 12/01/2011 0836   AST 24 01/03/2013 0900   AST 15 12/01/2011 0836   ALT 18 01/03/2013 0900   ALT 16 12/01/2011 0836   BILITOT 0.29 01/03/2013 0900   BILITOT 0.3 12/01/2011 0836      STUDIES: No results found.  ASSESSMENT: 57 y.o.  [place] woman presenting with anemia, found to have an IgA >6g, with an M-spike of 4.5 g (and a second M-spike of 0.4g), urine IFE showing IgA-lambda and lambda light chains, creatinine 0.77, calcium 10.0, albumin 3.1, and beta-2-microglobulin 5.51; with bone survey showing multiple myeloma lesions and bone marrow biopsy 09/01/2011 showing 75% myeloma cells. 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  (2) on prophylactic coumadin, discontinued as of 12/13/2011.  (3) zolendronic acid every 28 days, 09/19/2011 to 01/05/2012 (five doses)  (4) s/p neupogen mobilization October 2013, with a collection of 4.26 x10^6 CD34 positive cells  (5) status post melphalan at 200 mg/M2 on 02/23/2012, followed by autologous stem cell transplant the following day.  (6) on 06/07/2012 started maintenance bortezomib sq at 1.3 mg/M2 Q2w, with bactrim and acyclovir prophylaxis, the plan being to contuinue this for 2 years as per the HOVON trial  (7) zolendronic acid, given monthly, and resumed June 2014  PLAN:  [place]ya is doing fine from a myeloma point of view, tolerating her treatment without any peripheral neuropathy or other side effects beyond a minimal discomfort and bruising of his subcutaneous shots themselves. We are going to continue the bortezomib every 2 weeks and is alendronate every 4 weeks, and she will see [place] again in early October for routine followup. Her next visit we'll then be  with Dr. Sheryn Bison November 20 or so. She will see me December 15 for further followup.  She knows to call for any problems that may develop before her next visit here.   [place] Dost C    01/14/2013

## 2013-01-16 NOTE — Progress Notes (Signed)
This encounter was created in error - please disregard.

## 2013-01-16 NOTE — Progress Notes (Signed)
ID: Laura Hester   DOB: 1955/07/20  MR#: [id number]  CSN#:[id number]  PCP: Laura Hoff, MD GYN: Genia Del SU: OTHER MD: Armandina Stammer  HISTORY OF PRESENT ILLNESS: Laura Hester has a history of iron deficiency anemia secondary to menorrhagia. However, as this persisted despite near-total cessation of her menses and despite iron supplementation, she was referred to Dr. Dorena Cookey for GI evaluation. He found the patient's Hb to have decreased from 9.17 June 2011 to 8.3 07/29/2011. MCV was 95.9, ferritin 93.3, with a normal folate and negative ANA. Guaiacs were negative. Creatinine was 0.77.  t-transglutaminase IgA was normal at 2, but the total IgA was 6.9 g. Accordingly on 08/04/2011 an SPEP was obtained, showing an M-spike of 4.5 g, with a secondary M-spike of 0.4 g. Total protein was 10.0 with albumin 3.2, calcium 10.2. Bone survey 08/26/2011 showed multiple bone lesions consistent with myeloma and bone marrow biopsy 09/01/2011 confirmed the diagnosis with 75% myeloma cells in the marrow. [name] subsequent history is detailed below.  INTERVAL HISTORY: Laura Hester returns today for followup of her multiple myeloma accompanied by her husband Laura Hester. Since her last visit here her lambda light chains have risen into determinations. This indicates progression. I called her, letter these results, and asked her to come in for reevaluation today  REVIEW OF SYSTEMS: Laura Hester herself has noticed some changes as compared to her baseline. She has felt more tired. She is having more hip pain particularly on the right side. There have been no unusual headaches, visual changes, dizziness, gait imbalance, nausea, vomiting, cough, phlegm production, pleurisy, shortness of breath, chest pain or pressure, or change in bowel or bladder habits. There have been no unexplained weight loss or fatigue, no rash, no bleeding, no fever, and no peripheral edema. A detailed review of systems today was otherwise  stable  PAST MEDICAL HISTORY: Past Medical History  Diagnosis Date  . Fibrocystic breast disease   . Allergic rhinitis   . HTN (hypertension)     mild/ observation only  . Prediabetes   . SUI (stress urinary incontinence, female)   . Overweight (BMI 25.0-29.9)   . Multiple myeloma, stage 3 08/20/11 dx  . Anemia     resolved    PAST SURGICAL HISTORY: Past Surgical History  Procedure Laterality Date  . Cholecystectomy  1995  . Tonsillectomy and adenoidectomy    . Carpal tunnel release      FAMILY HISTORY Family History  Problem Relation Age of Onset  . Heart disease Father   . Breast cancer Mother   . Heart disease Mother   . Diabetes Paternal Grandfather   . Hypertension Maternal Grandmother   . Hypertension Brother   The patient's parents are in their mid-70s.Her mother has a remote history of breast cancer. She has two brothers, one her twin; no sisters. There is no other history of cancer in the family to her knowledge  GYNECOLOGIC HISTORY: Menarche age 48, first live birth age 9, GX P19. Last period was January 2013, before that May 2012.  SOCIAL HISTORY: She is a Software engineer and taught kindergarten until 2012. Her husband Laura Hester, graduated from [employer] and works in Regulatory affairs officer.  Daughter Laura Hester works for [employer], married in 2013. Daughter Laura Hester studies works in [place] and comes home every other week and The patient attends the [employer]   ADVANCED DIRECTIVES: Not in place  HEALTH MAINTENANCE: History  Substance Use Topics  . Smoking status: Never Smoker   . Smokeless tobacco:  Never Used  . Alcohol Use: No     Colonoscopy: 2009/Laura Hester  PAP: March 2014  Bone density: never  Lipid panel: [place]/ "excellent"  Mammography: June of 2014  Allergies  Allergen Reactions  . Wellbutrin [Bupropion Hcl] Hives    Current Outpatient Prescriptions  Medication Sig Dispense Refill  . omeprazole (PRILOSEC) 20 MG capsule Take 1  capsule (20 mg total) by mouth daily.  30 capsule  12  . potassium chloride SA (K-DUR,KLOR-CON) 20 MEQ tablet Take 1 tablet (20 mEq total) by mouth daily.  90 tablet  1  . sulfamethoxazole-trimethoprim (BACTRIM DS,SEPTRA DS) 800-160 MG per tablet Take 1 tablet by mouth 2 (two) times a week.  60 tablet  12  . valACYclovir (VALTREX) 500 MG tablet Take 1 tablet (500 mg total) by mouth daily.  30 tablet  12   No current facility-administered medications for this visit.   OBJECTIVE: Middle-aged white woman in no acute distress There were no vitals filed for this visit.   There is no weight on file to calculate BMI.    ECOG FS: 0  There were no vitals filed for this visit. Vitals - 1 value per visit 01/14/2013  SYSTOLIC 129  DIASTOLIC 73  Pulse 106  Temperature 98.6  Respirations 16  Weight (lb) 168.56  Height   BMI 30.82  VISIT REPORT    Sclerae unicteric, pupils equal round and reactive to light Oropharynx clear No cervical or supraclavicular adenopathy Lungs clear bilaterally Heart regular rate and rhythm, no murmur appreciated Abdomen soft, positive bowel sounds, nontender, no masses palpated MSK no focal spinal tenderness  No peripheral edema Neuro: nonfocal, well oriented, positive affect Breasts: Deferred; both axillae are benign    LAB RESULTS: Results for [name], [name] (MRN [id number]) as of 01/16/2013 08:43  Ref. Range 09/27/2012 08:42 10/11/2012 08:25 11/05/2012 08:16 12/06/2012 09:41 01/03/2013 09:01  Lambda Free Lght Chn Latest Range: 0.57-2.63 mg/dL 4.09 8.11 9.14 7.82 (H) 19.10 (H)   M-Spike, % g/dL  NOT DET    NOT DET    NOT DET    NOT DET    NOT DET    NOT DET    NOT     Lab Results  Component Value Date   WBC 5.7 01/03/2013   NEUTROABS 3.7 01/03/2013   HGB 14.5 01/03/2013   HCT 44.6 01/03/2013   MCV 89.9 01/03/2013   PLT 228 01/03/2013      Chemistry      Component Value Date/Time   NA 145 01/03/2013 0900   NA 143 12/01/2011 0836   K 4.0  01/03/2013 0900   K 4.0 12/01/2011 0836   CL 107 09/27/2012 0842   CL 105 12/01/2011 0836   CO2 25 01/03/2013 0900   CO2 29 12/01/2011 0836   BUN 10.9 01/03/2013 0900   BUN 12 12/01/2011 0836   CREATININE 0.7 01/03/2013 0900   CREATININE 0.52 12/01/2011 0836      Component Value Date/Time   CALCIUM 9.8 01/03/2013 0900   CALCIUM 8.9 12/01/2011 0836   ALKPHOS 63 01/03/2013 0900   ALKPHOS 110 12/01/2011 0836   AST 24 01/03/2013 0900   AST 15 12/01/2011 0836   ALT 18 01/03/2013 0900   ALT 16 12/01/2011 0836   BILITOT 0.29 01/03/2013 0900   BILITOT 0.3 12/01/2011 0836      STUDIES: No results found.  ASSESSMENT: 57 y.o.  [place] woman presenting with anemia, found to have an IgA >6g, with an M-spike of 4.5 g (  and a second M-spike of 0.4g), urine IFE showing IgA-lambda and lambda light chains, creatinine 0.77, calcium 10.0, albumin 3.1, and beta-2-microglobulin 5.51; with bone survey showing multiple myeloma lesions and bone marrow biopsy 09/01/2011 showing 75% myeloma cells. 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  (2) on prophylactic coumadin, discontinued as of 12/13/2011.  (3) zolendronic acid every 28 days, 09/19/2011 to 01/05/2012 (five doses)  (4) s/p neupogen mobilization October 2013, with a collection of 4.26 x10^6 CD34 positive cells  (5) status post melphalan at 200 mg/M2 on 02/23/2012, followed by autologous stem cell transplant the following day.  (6) on 06/07/2012 started maintenance bortezomib sq at 1.3 mg/M2 Q2w, with bactrim and acyclovir prophylaxis, the plan being to contuinue this for 2 years as per the HOVON trial  (7) zolendronic acid, given monthly resumed June 2014  (8) disease progression noted with a rise in the IgA lambda fraction September 2014  PLAN:  I have discussed  Haddie's situation with Dr. Sheryn Bison from [facility]. We are both concerned that her tumor has become active again less than a year after her transplant. We have a high-dose carfilzomib study and Dr. Sheryn Bison agrees in a separately scanned e-mail that this is a reasonable move at this point. Of course there are many other possible treatments we could go to.  I spent over an hour today discussing the overall situation and the proposed study with Jamesetta So and Laura Hester. They have a very good understanding of what is involved, have met with the research nurse, and have signed consent. We are going to obtain a repeat bone survey and bone marrow biopsy and the plan is for [place] to start her carfilzomib October 20. If she tolerates the first dose well she will be randomized to continuing standard dose versus going on high-dose.  She really has an appointment with Dr. Sheryn Bison for mid November. They wanted to know if they should postpone that. My feeling is that they should go and discuss whether a second autologous transplant perhaps with different preparatory drugs or consideration of an allogeneic transplant at some point might be feasible. Emmerie does have 2 brothers who could be tested for HLA matching. Certainly if this is going to be a consideration, the sooner we start the better.  Laura Hester is going to see [place] again October 20. We will review her restaging studies and start her treatment at that time. She knows to call for any problems that may develop before her next visit here.  Laura Hester C    01/16/2013

## 2013-01-17 ENCOUNTER — Ambulatory Visit: Payer: BC Managed Care – PPO

## 2013-01-17 ENCOUNTER — Other Ambulatory Visit: Payer: BC Managed Care – PPO | Admitting: Lab

## 2013-01-17 ENCOUNTER — Other Ambulatory Visit: Payer: Self-pay | Admitting: *Deleted

## 2013-01-17 ENCOUNTER — Telehealth: Payer: Self-pay | Admitting: *Deleted

## 2013-01-17 ENCOUNTER — Encounter: Payer: Self-pay | Admitting: *Deleted

## 2013-01-17 DIAGNOSIS — C9000 Multiple myeloma not having achieved remission: Secondary | ICD-10-CM

## 2013-01-17 NOTE — Telephone Encounter (Signed)
Per Dondra Spry RN with research- need for MD to order bone marrow biopsy and port placement per IR with pt's request for Dr Fredia Sorrow.  This RN called to IR and spoke with Belinda Fisher placement scheduled for this Thur 10/9 at 930am in radiology Bone marrow biopsy scheduled for this Fri 10/10 at 630am in radiology.  Pt will need a driver and  be NPO for both procedures post midnight.  Per Inetta Fermo Dr Fredia Sorrow is attending IR physician for both procedures.  This RN called pt and informed her of above as well as spoke with Dondra Spry in [employer].

## 2013-01-17 NOTE — Progress Notes (Signed)
Per Dondra Spry RN with research- need for MD to order bone marrow biopsy and port placement

## 2013-01-18 ENCOUNTER — Other Ambulatory Visit: Payer: Self-pay | Admitting: Oncology

## 2013-01-18 ENCOUNTER — Encounter (HOSPITAL_COMMUNITY): Payer: Self-pay | Admitting: Pharmacy Technician

## 2013-01-18 DIAGNOSIS — C9002 Multiple myeloma in relapse: Secondary | ICD-10-CM

## 2013-01-19 ENCOUNTER — Other Ambulatory Visit: Payer: Self-pay | Admitting: Radiology

## 2013-01-20 ENCOUNTER — Encounter (HOSPITAL_COMMUNITY): Payer: Self-pay

## 2013-01-20 ENCOUNTER — Other Ambulatory Visit: Payer: Self-pay | Admitting: Oncology

## 2013-01-20 ENCOUNTER — Ambulatory Visit (HOSPITAL_COMMUNITY)
Admission: RE | Admit: 2013-01-20 | Discharge: 2013-01-20 | Disposition: A | Discharge status: Home / Self Care | Payer: BC Managed Care – PPO | Source: Ambulatory Visit | Attending: Oncology | Admitting: Oncology

## 2013-01-20 DIAGNOSIS — I10 Essential (primary) hypertension: Secondary | ICD-10-CM | POA: Diagnosis not present

## 2013-01-20 DIAGNOSIS — Z79899 Other long term (current) drug therapy: Secondary | ICD-10-CM | POA: Insufficient documentation

## 2013-01-20 DIAGNOSIS — C9000 Multiple myeloma not having achieved remission: Secondary | ICD-10-CM | POA: Diagnosis present

## 2013-01-20 LAB — CBC WITH DIFFERENTIAL/PLATELET
Basophils Absolute: 0 10*3/uL (ref 0.0–0.1)
Basophils Relative: 0 % (ref 0–1)
Eosinophils Absolute: 0 10*3/uL (ref 0.0–0.7)
Eosinophils Relative: 1 % (ref 0–5)
Lymphs Abs: 1 10*3/uL (ref 0.7–4.0)
MCH: 29.2 pg (ref 26.0–34.0)
MCHC: 32.7 g/dL (ref 30.0–36.0)
Monocytes Absolute: 0.4 10*3/uL (ref 0.1–1.0)
Neutro Abs: 3.8 10*3/uL (ref 1.7–7.7)
Neutrophils Relative %: 72 % (ref 43–77)
RDW: 14.2 % (ref 11.5–15.5)

## 2013-01-20 LAB — APTT: aPTT: 28 seconds (ref 24–37)

## 2013-01-20 LAB — PROTIME-INR: INR: 1.06 (ref 0.00–1.49)

## 2013-01-20 MED ORDER — FENTANYL CITRATE 0.05 MG/ML IJ SOLN
INTRAMUSCULAR | Status: AC | PRN
  Administered 2013-01-20: 25 ug via INTRAVENOUS
  Administered 2013-01-20: 100 ug via INTRAVENOUS
  Administered 2013-01-20: 25 ug via INTRAVENOUS

## 2013-01-20 MED ORDER — MIDAZOLAM HCL 2 MG/2ML IJ SOLN
INTRAMUSCULAR | Status: AC
  Filled 2013-01-20: qty 6

## 2013-01-20 MED ORDER — FENTANYL CITRATE 0.05 MG/ML IJ SOLN
INTRAMUSCULAR | Status: AC
  Filled 2013-01-20: qty 6

## 2013-01-20 MED ORDER — CEFAZOLIN SODIUM-DEXTROSE 2-3 GM-% IV SOLR
INTRAVENOUS | Status: AC
  Administered 2013-01-20: 2 g via INTRAVENOUS
  Filled 2013-01-20: qty 50

## 2013-01-20 MED ORDER — MIDAZOLAM HCL 2 MG/2ML IJ SOLN
INTRAMUSCULAR | Status: AC | PRN
  Administered 2013-01-20: 1 mg via INTRAVENOUS
  Administered 2013-01-20: 0.5 mg via INTRAVENOUS
  Administered 2013-01-20: 1 mg via INTRAVENOUS
  Administered 2013-01-20: 0.5 mg via INTRAVENOUS

## 2013-01-20 MED ORDER — LIDOCAINE-EPINEPHRINE (PF) 2 %-1:200000 IJ SOLN
INTRAMUSCULAR | Status: AC
  Filled 2013-01-20: qty 20

## 2013-01-20 MED ORDER — LIDOCAINE HCL 1 % IJ SOLN
INTRAMUSCULAR | Status: AC
  Filled 2013-01-20: qty 20

## 2013-01-20 MED ORDER — SODIUM CHLORIDE 0.9 % IV SOLN
INTRAVENOUS | Status: DC
  Administered 2013-01-20: 10:00:00 via INTRAVENOUS

## 2013-01-20 MED ORDER — CEFAZOLIN SODIUM-DEXTROSE 2-3 GM-% IV SOLR
2.0000 g | Freq: Once | INTRAVENOUS | Status: AC
  Administered 2013-01-20: 2 g via INTRAVENOUS

## 2013-01-20 NOTE — Procedures (Signed)
Procedure:  Right IJ porta-cath Access:  Right IJ vein SL Power port with tip at cavoatrial junction.  No PTX.  OK to use.

## 2013-01-20 NOTE — H&P (Signed)
Laura Hester is an 57 y.o. female.   Chief Complaint: "I'm getting a port a cath" HPI: Patient with history of multiple myeloma with evidence of disease progression presents today for port a cath placement for chemotherapy.  Past Medical History  Diagnosis Date  . Fibrocystic breast disease   . Allergic rhinitis   . HTN (hypertension)     mild/ observation only  . Prediabetes   . SUI (stress urinary incontinence, female)   . Overweight (BMI 25.0-29.9)   . Multiple myeloma, stage 3 08/20/11 dx  . Anemia     resolved    Past Surgical History  Procedure Laterality Date  . Cholecystectomy  1995  . Tonsillectomy and adenoidectomy    . Carpal tunnel release      Family History  Problem Relation Age of Onset  . Heart disease Father   . Breast cancer Mother   . Heart disease Mother   . Diabetes Paternal Grandfather   . Hypertension Maternal Grandmother   . Hypertension Brother    Social History:  reports that she has never smoked. She has never used smokeless tobacco. She reports that she does not drink alcohol or use illicit drugs.  Allergies:  Allergies  Allergen Reactions  . Wellbutrin [Bupropion Hcl] Hives    Current outpatient prescriptions:naproxen sodium (ANAPROX) 220 MG tablet, Take 220 mg by mouth 2 (two) times daily with a meal., Disp: , Rfl: ;  omeprazole (PRILOSEC) 20 MG capsule, Take 1 capsule (20 mg total) by mouth daily., Disp: 30 capsule, Rfl: 12;  potassium chloride SA (K-DUR,KLOR-CON) 20 MEQ tablet, Take 1 tablet (20 mEq total) by mouth daily., Disp: 90 tablet, Rfl: 1 valACYclovir (VALTREX) 500 MG tablet, Take 1 tablet (500 mg total) by mouth daily., Disp: 30 tablet, Rfl: 12;  sulfamethoxazole-trimethoprim (BACTRIM DS) 800-160 MG per tablet, Take 2 tablets by mouth 2 (two) times a week. Monday  AND THURSDAY, Disp: , Rfl:  Current facility-administered medications:0.9 %  sodium chloride infusion, , Intravenous, Continuous, Brayton El, PA-C, Last Rate: 20  mL/hr at 01/20/13 1015;  ceFAZolin (ANCEF) IVPB 2 g/50 mL premix, 2 g, Intravenous, Once, Brayton El, PA-C   Results for orders placed during the hospital encounter of 01/20/13 (from the past 48 hour(s))  APTT     Status: None   Collection Time    01/20/13 10:00 AM      Result Value Range   aPTT 28  24 - 37 seconds  CBC WITH DIFFERENTIAL     Status: Abnormal   Collection Time    01/20/13 10:00 AM      Result Value Range   WBC 5.2  4.0 - 10.5 K/uL   RBC 4.01  3.87 - 5.11 MIL/uL   Hemoglobin 11.7 (*) 12.0 - 15.0 g/dL   HCT 16.1 (*) 09.6 - 04.5 %   MCV 89.3  78.0 - 100.0 fL   MCH 29.2  26.0 - 34.0 pg   MCHC 32.7  30.0 - 36.0 g/dL   RDW 40.9  81.1 - 91.4 %   Platelets 273  150 - 400 K/uL   Neutrophils Relative % 72  43 - 77 %   Neutro Abs 3.8  1.7 - 7.7 K/uL   Lymphocytes Relative 19  12 - 46 %   Lymphs Abs 1.0  0.7 - 4.0 K/uL   Monocytes Relative 8  3 - 12 %   Monocytes Absolute 0.4  0.1 - 1.0 K/uL   Eosinophils Relative 1  0 -  5 %   Eosinophils Absolute 0.0  0.0 - 0.7 K/uL   Basophils Relative 0  0 - 1 %   Basophils Absolute 0.0  0.0 - 0.1 K/uL  PROTIME-INR     Status: None   Collection Time    01/20/13 10:00 AM      Result Value Range   Prothrombin Time 13.6  11.6 - 15.2 seconds   INR 1.06  0.00 - 1.49   No results found.  Review of Systems  Constitutional: Negative for fever and chills.  Respiratory: Negative for cough and shortness of breath.   Cardiovascular: Negative for chest pain.  Gastrointestinal: Negative for nausea, vomiting and abdominal pain.  Musculoskeletal: Positive for joint pain.       Occ back pain  Neurological: Negative for headaches.  Endo/Heme/Allergies: Does not bruise/bleed easily.    Blood pressure 141/66, pulse 96, temperature 98.7 F (37.1 C), temperature source Oral, resp. rate 16, height 5\' 2"  (1.575 m), weight 167 lb 6 oz (75.921 kg), last menstrual period 04/20/2011, SpO2 100.00%. Physical Exam  Constitutional: She is oriented to  person, place, and time. She appears well-developed and well-nourished.  Cardiovascular: Normal rate and regular rhythm.   Respiratory: Effort normal and breath sounds normal.  GI: Soft. Bowel sounds are normal. There is no tenderness.  Musculoskeletal: Normal range of motion. She exhibits no edema.  Neurological: She is alert and oriented to person, place, and time.     Assessment/Plan Pt with hx of multiple myeloma; now with evidence of disease progression. Plan is for port a cath placement today for chemotherapy. Details/risks of procedure d/w pt/husband with their understanding and consent.  ALLRED,D KEVIN 01/20/2013, 10:41 AM

## 2013-01-20 NOTE — H&P (Signed)
Agree 

## 2013-01-21 ENCOUNTER — Other Ambulatory Visit (HOSPITAL_COMMUNITY): Payer: BC Managed Care – PPO

## 2013-01-21 ENCOUNTER — Ambulatory Visit (HOSPITAL_COMMUNITY)
Admission: RE | Admit: 2013-01-21 | Discharge: 2013-01-21 | Disposition: A | Discharge status: Home / Self Care | Payer: BC Managed Care – PPO | Source: Ambulatory Visit | Attending: Oncology | Admitting: Oncology

## 2013-01-21 ENCOUNTER — Encounter (HOSPITAL_COMMUNITY): Payer: Self-pay

## 2013-01-21 DIAGNOSIS — C9002 Multiple myeloma in relapse: Secondary | ICD-10-CM

## 2013-01-21 DIAGNOSIS — C9000 Multiple myeloma not having achieved remission: Secondary | ICD-10-CM | POA: Diagnosis not present

## 2013-01-21 LAB — BONE MARROW EXAM: Bone Marrow Exam: 714

## 2013-01-21 MED ORDER — MIDAZOLAM HCL 2 MG/2ML IJ SOLN
INTRAMUSCULAR | Status: AC | PRN
  Administered 2013-01-21 (×4): 1 mg via INTRAVENOUS

## 2013-01-21 MED ORDER — FENTANYL CITRATE 0.05 MG/ML IJ SOLN
INTRAMUSCULAR | Status: AC
  Filled 2013-01-21: qty 6

## 2013-01-21 MED ORDER — HEPARIN SOD (PORK) LOCK FLUSH 100 UNIT/ML IV SOLN
500.0000 [IU] | INTRAVENOUS | Status: DC | PRN
  Filled 2013-01-21: qty 5

## 2013-01-21 MED ORDER — HEPARIN SOD (PORK) LOCK FLUSH 100 UNIT/ML IV SOLN
500.0000 [IU] | INTRAVENOUS | Status: AC | PRN
  Administered 2013-01-21: 500 [IU]

## 2013-01-21 MED ORDER — MIDAZOLAM HCL 2 MG/2ML IJ SOLN
INTRAMUSCULAR | Status: AC
  Filled 2013-01-21: qty 6

## 2013-01-21 MED ORDER — SODIUM CHLORIDE 0.9 % IV SOLN: INTRAVENOUS | Status: DC

## 2013-01-21 MED ORDER — SODIUM CHLORIDE 0.9 % IV SOLN
INTRAVENOUS | Status: DC
  Administered 2013-01-21: 08:00:00 via INTRAVENOUS

## 2013-01-21 MED ORDER — HYDROCODONE-ACETAMINOPHEN 5-325 MG PO TABS: 1.0000 | ORAL_TABLET | ORAL | Status: DC | PRN

## 2013-01-21 MED ORDER — FENTANYL CITRATE 0.05 MG/ML IJ SOLN
INTRAMUSCULAR | Status: AC | PRN
  Administered 2013-01-21: 50 ug via INTRAVENOUS
  Administered 2013-01-21: 100 ug via INTRAVENOUS
  Administered 2013-01-21: 50 ug via INTRAVENOUS

## 2013-01-21 NOTE — Procedures (Signed)
Procedure:  CT guided bone marrow biopsy Findings:  Aspirate and core biopsy of right iliac bone marrow, including extra samples for trial protocol. No complications.

## 2013-01-21 NOTE — H&P (Signed)
Agree 

## 2013-01-21 NOTE — H&P (Signed)
Laura Hester is an 57 y.o. female.   Chief Complaint: "I'm here for a bone marrow biopsy" HPI: Patient with history of multiple myeloma with evidence of disease progression presents today for bone marrow biopsy as part of her workup for treatment.  She had port placed yesterday and she feels fine. PMHx and meds reviewed  Past Medical History  Diagnosis Date  . Fibrocystic breast disease   . Allergic rhinitis   . HTN (hypertension)     mild/ observation only  . Prediabetes   . SUI (stress urinary incontinence, female)   . Overweight (BMI 25.0-29.9)   . Multiple myeloma, stage 3 08/20/11 dx  . Anemia     resolved    Past Surgical History  Procedure Laterality Date  . Cholecystectomy  1995  . Tonsillectomy and adenoidectomy    . Carpal tunnel release      Family History  Problem Relation Age of Onset  . Heart disease Father   . Breast cancer Mother   . Heart disease Mother   . Diabetes Paternal Grandfather   . Hypertension Maternal Grandmother   . Hypertension Brother    Social History:  reports that she has never smoked. She has never used smokeless tobacco. She reports that she does not drink alcohol or use illicit drugs.  Allergies:  Allergies  Allergen Reactions  . Wellbutrin [Bupropion Hcl] Hives    Current outpatient prescriptions:naproxen sodium (ANAPROX) 220 MG tablet, Take 220 mg by mouth 2 (two) times daily with a meal., Disp: , Rfl: ;  omeprazole (PRILOSEC) 20 MG capsule, Take 1 capsule (20 mg total) by mouth daily., Disp: 30 capsule, Rfl: 12;  potassium chloride SA (K-DUR,KLOR-CON) 20 MEQ tablet, Take 1 tablet (20 mEq total) by mouth daily., Disp: 90 tablet, Rfl: 1 sulfamethoxazole-trimethoprim (BACTRIM DS) 800-160 MG per tablet, Take 2 tablets by mouth 2 (two) times a week. Monday  AND THURSDAY, Disp: , Rfl: ;  valACYclovir (VALTREX) 500 MG tablet, Take 1 tablet (500 mg total) by mouth daily., Disp: 30 tablet, Rfl: 12 Current facility-administered  medications:0.9 %  sodium chloride infusion, , Intravenous, Continuous, Brayton El, PA-C, Last Rate: 20 mL/hr at 01/21/13 0737;  0.9 %  sodium chloride infusion, , Intravenous, Continuous, Brayton El, PA-C;  fentaNYL (SUBLIMAZE) 0.05 MG/ML injection, , , , ;  heparin lock flush 100 unit/mL, 500 Units, Intracatheter, Prior to discharge, Reola Calkins, MD;  midazolam (VERSED) 2 MG/2ML injection, , , ,    Results for orders placed during the hospital encounter of 01/20/13 (from the past 48 hour(s))  APTT     Status: None   Collection Time    01/20/13 10:00 AM      Result Value Range   aPTT 28  24 - 37 seconds  CBC WITH DIFFERENTIAL     Status: Abnormal   Collection Time    01/20/13 10:00 AM      Result Value Range   WBC 5.2  4.0 - 10.5 K/uL   RBC 4.01  3.87 - 5.11 MIL/uL   Hemoglobin 11.7 (*) 12.0 - 15.0 g/dL   HCT 95.6 (*) 21.3 - 08.6 %   MCV 89.3  78.0 - 100.0 fL   MCH 29.2  26.0 - 34.0 pg   MCHC 32.7  30.0 - 36.0 g/dL   RDW 57.8  46.9 - 62.9 %   Platelets 273  150 - 400 K/uL   Neutrophils Relative % 72  43 - 77 %   Neutro Abs 3.8  1.7 - 7.7 K/uL   Lymphocytes Relative 19  12 - 46 %   Lymphs Abs 1.0  0.7 - 4.0 K/uL   Monocytes Relative 8  3 - 12 %   Monocytes Absolute 0.4  0.1 - 1.0 K/uL   Eosinophils Relative 1  0 - 5 %   Eosinophils Absolute 0.0  0.0 - 0.7 K/uL   Basophils Relative 0  0 - 1 %   Basophils Absolute 0.0  0.0 - 0.1 K/uL  PROTIME-INR     Status: None   Collection Time    01/20/13 10:00 AM      Result Value Range   Prothrombin Time 13.6  11.6 - 15.2 seconds   INR 1.06  0.00 - 1.49   Ir Fluoro Guide Cv Line Right  01/20/2013   CLINICAL DATA:  Relapse of multiple myeloma with need for porta cath for chemotherapy.  EXAM: IMPLANTED PORT A CATH PLACEMENT WITH ULTRASOUND AND FLUOROSCOPIC GUIDANCE  ANESTHESIA/SEDATION: 3.0 Mg IV Versed; 150 mcg IV Fentanyl  Total Moderate Sedation Time:  40 minutes  Additional Medications: 2 g IV Ancef. As antibiotic prophylaxis,  Ancef was ordered pre-procedure and administered intravenously within one hour of incision.  FLUOROSCOPY TIME:  42 seconds.  PROCEDURE: The procedure, risks, benefits, and alternatives were explained to the patient. Questions regarding the procedure were encouraged and answered. The patient understands and consents to the procedure.  The right neck and chest were prepped with chlorhexidine in a sterile fashion, and a sterile drape was applied covering the operative field. Maximum barrier sterile technique with sterile gowns and gloves were used for the procedure. Local anesthesia was provided with 1% lidocaine and lidocaine with epinephrine.  Ultrasound was used to confirm patency of the right internal jugular vein. After creating a small venotomy incision, a 21 gauge needle was advanced into the right internal jugular vein under direct, real-time ultrasound guidance. Ultrasound image documentation was performed. After securing guidewire access, an 8 Fr dilator was placed. A J-wire was kinked to measure appropriate catheter length.  A subcutaneous port pocket was then created along the upper chest wall utilizing sharp and blunt dissection. Portable cautery was utilized. The pocket was irrigated with sterile saline.  A single lumen power injectable port was chosen for placement. The 8 Fr catheter was tunneled from the port pocket site to the venotomy incision. The port was placed in the pocket and secured with two Ethilon tacking sutures. External catheter was trimmed to appropriate length based on guidewire measurement.  At the venotomy, an 8 Fr peel-away sheath was placed over a guidewire. The catheter was then placed through the sheath and the sheath removed. Final catheter positioning was confirmed and documented with a fluoroscopic spot image. The port was accessed with a needle and aspirated and flushed with heparinized saline. The needle was removed.  The venotomy and port pocket incisions were closed with  subcutaneous 3-0 Monocryl and subcuticular 4-0 Vicryl. Dermabond was applied to both incisions.  Complications: None. No pneumothorax.  FINDINGS: After catheter placement, the tip lies at the cavoatrial junction. The catheter aspirates normally and is ready for immediate use.  IMPRESSION: Placement of single lumen port a cath via right internal jugular vein. The catheter tip lies at the cavoatrial junction. A power injectable port a cath was placed and is ready for immediate use.   Electronically Signed   By: Irish Lack M.D.   On: 01/20/2013 13:38   Ir US Guide Vasc Access Right  01/20/2013  CLINICAL DATA:  Relapse of multiple myeloma with need for porta cath for chemotherapy.  EXAM: IMPLANTED PORT A CATH PLACEMENT WITH ULTRASOUND AND FLUOROSCOPIC GUIDANCE  ANESTHESIA/SEDATION: 3.0 Mg IV Versed; 150 mcg IV Fentanyl  Total Moderate Sedation Time:  40 minutes  Additional Medications: 2 g IV Ancef. As antibiotic prophylaxis, Ancef was ordered pre-procedure and administered intravenously within one hour of incision.  FLUOROSCOPY TIME:  42 seconds.  PROCEDURE: The procedure, risks, benefits, and alternatives were explained to the patient. Questions regarding the procedure were encouraged and answered. The patient understands and consents to the procedure.  The right neck and chest were prepped with chlorhexidine in a sterile fashion, and a sterile drape was applied covering the operative field. Maximum barrier sterile technique with sterile gowns and gloves were used for the procedure. Local anesthesia was provided with 1% lidocaine and lidocaine with epinephrine.  Ultrasound was used to confirm patency of the right internal jugular vein. After creating a small venotomy incision, a 21 gauge needle was advanced into the right internal jugular vein under direct, real-time ultrasound guidance. Ultrasound image documentation was performed. After securing guidewire access, an 8 Fr dilator was placed. A J-wire was  kinked to measure appropriate catheter length.  A subcutaneous port pocket was then created along the upper chest wall utilizing sharp and blunt dissection. Portable cautery was utilized. The pocket was irrigated with sterile saline.  A single lumen power injectable port was chosen for placement. The 8 Fr catheter was tunneled from the port pocket site to the venotomy incision. The port was placed in the pocket and secured with two Ethilon tacking sutures. External catheter was trimmed to appropriate length based on guidewire measurement.  At the venotomy, an 8 Fr peel-away sheath was placed over a guidewire. The catheter was then placed through the sheath and the sheath removed. Final catheter positioning was confirmed and documented with a fluoroscopic spot image. The port was accessed with a needle and aspirated and flushed with heparinized saline. The needle was removed.  The venotomy and port pocket incisions were closed with subcutaneous 3-0 Monocryl and subcuticular 4-0 Vicryl. Dermabond was applied to both incisions.  Complications: None. No pneumothorax.  FINDINGS: After catheter placement, the tip lies at the cavoatrial junction. The catheter aspirates normally and is ready for immediate use.  IMPRESSION: Placement of single lumen port a cath via right internal jugular vein. The catheter tip lies at the cavoatrial junction. A power injectable port a cath was placed and is ready for immediate use.   Electronically Signed   By: Irish Lack M.D.   On: 01/20/2013 13:38    Review of Systems  Constitutional: Negative for fever and chills.  Respiratory: Negative for cough and shortness of breath.   Cardiovascular: Negative for chest pain.  Gastrointestinal: Negative for nausea, vomiting and abdominal pain.  Musculoskeletal: Positive for joint pain.       Occ back pain  Neurological: Negative for headaches.  Endo/Heme/Allergies: Does not bruise/bleed easily.    Blood pressure 130/59, pulse 98,  temperature 98.3 F (36.8 C), temperature source Oral, resp. rate 16, last menstrual period 04/20/2011, SpO2 99.00%. Physical Exam  Constitutional: She is oriented to person, place, and time. She appears well-developed and well-nourished.  Cardiovascular: Normal rate and regular rhythm.   Respiratory: Effort normal and breath sounds normal.  Rt Port is accessed. Incisions c/d/i  GI: Soft. Bowel sounds are normal. There is no tenderness.  Musculoskeletal: Normal range of motion. She exhibits no edema.  Neurological: She is alert and oriented to person, place, and time.     Assessment/Plan Pt with hx of multiple myeloma; now with evidence of disease progression.  Discussed BM biopsy, she has had these before. Reviewed procedure, risks, complications, use of sedation. Labs reviewed. Consent signed in chart Brayton El 01/21/2013, 8:08 AM

## 2013-01-25 ENCOUNTER — Ambulatory Visit (HOSPITAL_BASED_OUTPATIENT_CLINIC_OR_DEPARTMENT_OTHER): Payer: BC Managed Care – PPO

## 2013-01-25 ENCOUNTER — Other Ambulatory Visit (HOSPITAL_BASED_OUTPATIENT_CLINIC_OR_DEPARTMENT_OTHER): Payer: BC Managed Care – PPO | Admitting: Lab

## 2013-01-25 DIAGNOSIS — C9000 Multiple myeloma not having achieved remission: Secondary | ICD-10-CM

## 2013-01-25 DIAGNOSIS — Z452 Encounter for adjustment and management of vascular access device: Secondary | ICD-10-CM

## 2013-01-25 DIAGNOSIS — C9002 Multiple myeloma in relapse: Secondary | ICD-10-CM

## 2013-01-25 LAB — MAGNESIUM (CC13): Magnesium: 2 mg/dl (ref 1.5–2.5)

## 2013-01-25 LAB — COMPREHENSIVE METABOLIC PANEL (CC13)
ALT: 13 U/L (ref 0–55)
AST: 23 U/L (ref 5–34)
Albumin: 2.9 g/dL — ABNORMAL LOW (ref 3.5–5.0)
Alkaline Phosphatase: 51 U/L (ref 40–150)
CO2: 21 mEq/L — ABNORMAL LOW (ref 22–29)
Calcium: 9.5 mg/dL (ref 8.4–10.4)
Chloride: 107 mEq/L (ref 98–109)
Creatinine: 0.8 mg/dL (ref 0.6–1.1)
Glucose: 110 mg/dl (ref 70–140)
Potassium: 4.4 mEq/L (ref 3.5–5.1)
Sodium: 141 mEq/L (ref 136–145)
Total Protein: 8.7 g/dL — ABNORMAL HIGH (ref 6.4–8.3)

## 2013-01-25 LAB — CBC WITH DIFFERENTIAL/PLATELET
EOS%: 0.8 % (ref 0.0–7.0)
Eosinophils Absolute: 0 10*3/uL (ref 0.0–0.5)
HGB: 10.6 g/dL — ABNORMAL LOW (ref 11.6–15.9)
MCH: 28.7 pg (ref 25.1–34.0)
MCV: 88 fL (ref 79.5–101.0)
MONO%: 10.4 % (ref 0.0–14.0)
NEUT#: 4.3 10*3/uL (ref 1.5–6.5)
RBC: 3.7 10*6/uL (ref 3.70–5.45)
RDW: 15 % — ABNORMAL HIGH (ref 11.2–14.5)
lymph#: 1.2 10*3/uL (ref 0.9–3.3)

## 2013-01-25 LAB — URIC ACID (CC13): Uric Acid, Serum: 5.6 mg/dl (ref 2.6–7.4)

## 2013-01-25 MED ORDER — HEPARIN SOD (PORK) LOCK FLUSH 100 UNIT/ML IV SOLN
500.0000 [IU] | Freq: Once | INTRAVENOUS | Status: AC
  Administered 2013-01-25: 500 [IU] via INTRAVENOUS
  Filled 2013-01-25: qty 5

## 2013-01-25 MED ORDER — SODIUM CHLORIDE 0.9 % IJ SOLN
10.0000 mL | INTRAMUSCULAR | Status: DC | PRN
  Administered 2013-01-25: 10 mL via INTRAVENOUS
  Filled 2013-01-25: qty 10

## 2013-01-25 NOTE — Patient Instructions (Signed)

## 2013-01-26 ENCOUNTER — Encounter (HOSPITAL_COMMUNITY)
Admission: RE | Admit: 2013-01-26 | Discharge: 2013-01-26 | Disposition: A | Discharge status: Home / Self Care | Payer: BC Managed Care – PPO | Source: Ambulatory Visit | Attending: Oncology | Admitting: Oncology

## 2013-01-26 ENCOUNTER — Encounter (HOSPITAL_COMMUNITY): Payer: Self-pay

## 2013-01-26 DIAGNOSIS — C9000 Multiple myeloma not having achieved remission: Secondary | ICD-10-CM | POA: Insufficient documentation

## 2013-01-26 MED ORDER — TECHNETIUM TC 99M MEDRONATE IV KIT
25.2000 | PACK | Freq: Once | INTRAVENOUS | Status: AC | PRN
  Administered 2013-01-26: 25.2 via INTRAVENOUS

## 2013-01-27 ENCOUNTER — Other Ambulatory Visit: Payer: BC Managed Care – PPO | Admitting: Lab

## 2013-01-27 ENCOUNTER — Telehealth: Payer: Self-pay | Admitting: *Deleted

## 2013-01-27 LAB — UIFE/LIGHT CHAINS/TP QN, 24-HR UR
Albumin, U: DETECTED
Alpha 1, Urine: DETECTED — AB
Alpha 2, Urine: DETECTED — AB
Beta, Urine: DETECTED — AB
Free Lambda Excretion/Day: 8.91 mg/d
Free Lambda Lt Chains,Ur: 0.33 mg/dL (ref 0.02–0.67)
Gamma Globulin, Urine: DETECTED — AB
Total Protein, Urine: 2.6 mg/dL
Volume, Urine: 2700 mL

## 2013-01-27 NOTE — Telephone Encounter (Signed)
Per research RN I have scheduled appts

## 2013-01-28 ENCOUNTER — Encounter (HOSPITAL_COMMUNITY)
Admission: RE | Admit: 2013-01-28 | Discharge: 2013-01-28 | Disposition: A | Discharge status: Home / Self Care | Payer: BC Managed Care – PPO | Source: Ambulatory Visit | Attending: Oncology | Admitting: Oncology

## 2013-01-28 DIAGNOSIS — C9000 Multiple myeloma not having achieved remission: Secondary | ICD-10-CM

## 2013-01-28 LAB — SPEP & IFE WITH QIG
Albumin ELP: 35.2 % — ABNORMAL LOW (ref 55.8–66.1)
Alpha-1-Globulin: 5 % — ABNORMAL HIGH (ref 2.9–4.9)
Alpha-2-Globulin: 10.1 % (ref 7.1–11.8)
Gamma Globulin: 5 % — ABNORMAL LOW (ref 11.1–18.8)
IgM, Serum: 12 mg/dL — ABNORMAL LOW (ref 52–322)
M-Spike, %: 1.18 g/dL
Total Protein, Serum Electrophoresis: 8 g/dL (ref 6.0–8.3)

## 2013-01-28 LAB — IGE: IgE (Immunoglobulin E), Serum: 12.6 IU/mL (ref 0.0–180.0)

## 2013-01-28 LAB — IGD: IgD: 2 mg/L (ref <179)

## 2013-01-28 LAB — KAPPA/LAMBDA LIGHT CHAINS
Kappa free light chain: 0.19 mg/dL — ABNORMAL LOW (ref 0.33–1.94)
Kappa:Lambda Ratio: 0.01 — ABNORMAL LOW (ref 0.26–1.65)

## 2013-01-28 LAB — PHOSPHORUS: Phosphorus: 4.8 mg/dL — ABNORMAL HIGH (ref 2.3–4.6)

## 2013-01-28 LAB — HIV ANTIBODY (ROUTINE TESTING W REFLEX): HIV: NONREACTIVE

## 2013-01-28 MED ORDER — FLUDEOXYGLUCOSE F - 18 (FDG) INJECTION
17.6000 | Freq: Once | INTRAVENOUS | Status: AC | PRN
  Administered 2013-01-28: 17.6 via INTRAVENOUS

## 2013-01-31 ENCOUNTER — Ambulatory Visit: Payer: BC Managed Care – PPO | Admitting: Physician Assistant

## 2013-01-31 ENCOUNTER — Other Ambulatory Visit: Payer: BC Managed Care – PPO | Admitting: Lab

## 2013-01-31 ENCOUNTER — Ambulatory Visit: Payer: BC Managed Care – PPO

## 2013-02-02 ENCOUNTER — Other Ambulatory Visit: Payer: BC Managed Care – PPO | Admitting: Lab

## 2013-02-02 ENCOUNTER — Ambulatory Visit (HOSPITAL_BASED_OUTPATIENT_CLINIC_OR_DEPARTMENT_OTHER): Payer: BC Managed Care – PPO

## 2013-02-02 ENCOUNTER — Ambulatory Visit (HOSPITAL_COMMUNITY): Payer: BC Managed Care – PPO | Attending: Cardiology | Admitting: Radiology

## 2013-02-02 VITALS — BP 135/63 | HR 100 | Temp 98.0°F | Resp 16

## 2013-02-02 DIAGNOSIS — C9002 Multiple myeloma in relapse: Secondary | ICD-10-CM

## 2013-02-02 DIAGNOSIS — C9000 Multiple myeloma not having achieved remission: Secondary | ICD-10-CM

## 2013-02-02 DIAGNOSIS — Z452 Encounter for adjustment and management of vascular access device: Secondary | ICD-10-CM

## 2013-02-02 DIAGNOSIS — I428 Other cardiomyopathies: Secondary | ICD-10-CM

## 2013-02-02 LAB — CHROMOSOME ANALYSIS, BONE MARROW

## 2013-02-02 LAB — RESEARCH LABS

## 2013-02-02 MED ORDER — HEPARIN SOD (PORK) LOCK FLUSH 100 UNIT/ML IV SOLN
500.0000 [IU] | Freq: Once | INTRAVENOUS | Status: AC
  Administered 2013-02-02: 500 [IU] via INTRAVENOUS
  Filled 2013-02-02: qty 5

## 2013-02-02 MED ORDER — SODIUM CHLORIDE 0.9 % IJ SOLN
10.0000 mL | INTRAMUSCULAR | Status: DC | PRN
  Administered 2013-02-02: 10 mL via INTRAVENOUS
  Filled 2013-02-02: qty 10

## 2013-02-02 NOTE — Progress Notes (Signed)
Limited Echocardiogram performed.  

## 2013-02-03 ENCOUNTER — Ambulatory Visit (HOSPITAL_COMMUNITY)
Admission: RE | Admit: 2013-02-03 | Discharge: 2013-02-03 | Disposition: A | Discharge status: Home / Self Care | Payer: BC Managed Care – PPO | Source: Ambulatory Visit | Attending: Oncology | Admitting: Oncology

## 2013-02-03 ENCOUNTER — Other Ambulatory Visit: Payer: Self-pay | Admitting: Oncology

## 2013-02-03 DIAGNOSIS — C9000 Multiple myeloma not having achieved remission: Secondary | ICD-10-CM | POA: Insufficient documentation

## 2013-02-07 ENCOUNTER — Telehealth: Payer: Self-pay | Admitting: Oncology

## 2013-02-07 ENCOUNTER — Other Ambulatory Visit (HOSPITAL_BASED_OUTPATIENT_CLINIC_OR_DEPARTMENT_OTHER): Payer: BC Managed Care – PPO | Admitting: Lab

## 2013-02-07 ENCOUNTER — Ambulatory Visit: Payer: BC Managed Care – PPO

## 2013-02-07 ENCOUNTER — Ambulatory Visit (HOSPITAL_BASED_OUTPATIENT_CLINIC_OR_DEPARTMENT_OTHER): Payer: BC Managed Care – PPO | Admitting: Physician Assistant

## 2013-02-07 ENCOUNTER — Encounter: Payer: Self-pay | Admitting: Oncology

## 2013-02-07 ENCOUNTER — Encounter: Payer: Self-pay | Admitting: Physician Assistant

## 2013-02-07 ENCOUNTER — Encounter: Payer: Self-pay | Admitting: *Deleted

## 2013-02-07 ENCOUNTER — Ambulatory Visit (HOSPITAL_BASED_OUTPATIENT_CLINIC_OR_DEPARTMENT_OTHER): Payer: BC Managed Care – PPO

## 2013-02-07 VITALS — BP 148/77 | HR 108 | Temp 97.1°F | Resp 20 | Ht 62.0 in | Wt 167.6 lb

## 2013-02-07 DIAGNOSIS — C9000 Multiple myeloma not having achieved remission: Secondary | ICD-10-CM

## 2013-02-07 DIAGNOSIS — C9001 Multiple myeloma in remission: Secondary | ICD-10-CM

## 2013-02-07 DIAGNOSIS — D649 Anemia, unspecified: Secondary | ICD-10-CM

## 2013-02-07 LAB — URIC ACID (CC13): Uric Acid, Serum: 6 mg/dl (ref 2.6–7.4)

## 2013-02-07 LAB — COMPREHENSIVE METABOLIC PANEL (CC13)
Albumin: 2.7 g/dL — ABNORMAL LOW (ref 3.5–5.0)
Alkaline Phosphatase: 54 U/L (ref 40–150)
Anion Gap: 12 mEq/L — ABNORMAL HIGH (ref 3–11)
BUN: 15.9 mg/dL (ref 7.0–26.0)
CO2: 21 mEq/L — ABNORMAL LOW (ref 22–29)
Calcium: 9.4 mg/dL (ref 8.4–10.4)
Glucose: 109 mg/dl (ref 70–140)
Potassium: 3.8 mEq/L (ref 3.5–5.1)
Total Bilirubin: 0.27 mg/dL (ref 0.20–1.20)
Total Protein: 8.8 g/dL — ABNORMAL HIGH (ref 6.4–8.3)

## 2013-02-07 LAB — CBC WITH DIFFERENTIAL/PLATELET
Basophils Absolute: 0 10*3/uL (ref 0.0–0.1)
EOS%: 1.2 % (ref 0.0–7.0)
Eosinophils Absolute: 0.1 10*3/uL (ref 0.0–0.5)
HCT: 30.3 % — ABNORMAL LOW (ref 34.8–46.6)
HGB: 9.6 g/dL — ABNORMAL LOW (ref 11.6–15.9)
MCH: 28.3 pg (ref 25.1–34.0)
MCHC: 31.7 g/dL (ref 31.5–36.0)
MCV: 89.4 fL (ref 79.5–101.0)
MONO%: 9.7 % (ref 0.0–14.0)
NEUT#: 3.8 10*3/uL (ref 1.5–6.5)
NEUT%: 64.4 % (ref 38.4–76.8)
Platelets: 256 10*3/uL (ref 145–400)
RDW: 15.3 % — ABNORMAL HIGH (ref 11.2–14.5)

## 2013-02-07 LAB — MAGNESIUM (CC13): Magnesium: 1.8 mg/dl (ref 1.5–2.5)

## 2013-02-07 LAB — PHOSPHORUS: Phosphorus: 4.6 mg/dL (ref 2.3–4.6)

## 2013-02-07 LAB — PRO B NATRIURETIC PEPTIDE: Pro B Natriuretic peptide (BNP): 597.1 pg/mL — ABNORMAL HIGH (ref <126)

## 2013-02-07 MED ORDER — LORAZEPAM 0.5 MG PO TABS: 0.5000 mg | ORAL_TABLET | Freq: Every evening | ORAL | Status: DC | PRN

## 2013-02-07 MED ORDER — ZOLEDRONIC ACID 4 MG/100ML IV SOLN
4.0000 mg | Freq: Once | INTRAVENOUS | Status: AC
  Administered 2013-02-07: 4 mg via INTRAVENOUS
  Filled 2013-02-07: qty 100

## 2013-02-07 MED ORDER — DEXAMETHASONE SODIUM PHOSPHATE 20 MG/5ML IJ SOLN
20.0000 mg | Freq: Once | INTRAMUSCULAR | Status: AC
  Administered 2013-02-07: 20 mg via INTRAVENOUS

## 2013-02-07 MED ORDER — HEPARIN SOD (PORK) LOCK FLUSH 100 UNIT/ML IV SOLN
500.0000 [IU] | Freq: Once | INTRAVENOUS | Status: AC
  Administered 2013-02-07: 500 [IU] via INTRAVENOUS
  Filled 2013-02-07: qty 5

## 2013-02-07 MED ORDER — SODIUM CHLORIDE 0.9 % IJ SOLN
10.0000 mL | INTRAMUSCULAR | Status: DC | PRN
  Administered 2013-02-07: 10 mL via INTRAVENOUS
  Filled 2013-02-07: qty 10

## 2013-02-07 MED ORDER — HEPARIN SOD (PORK) LOCK FLUSH 100 UNIT/ML IV SOLN
500.0000 [IU] | Freq: Once | INTRAVENOUS | Status: AC | PRN
  Administered 2013-02-07: 500 [IU]
  Filled 2013-02-07: qty 5

## 2013-02-07 MED ORDER — ZOLEDRONIC ACID 4 MG/5ML IV CONC: 4.0000 mg | Freq: Once | INTRAVENOUS | Status: DC

## 2013-02-07 MED ORDER — INV-CARFILZOMIB CHEMO INJECTION 60 MG SWOG S1304
20.0000 mg/m2 | Freq: Once | INTRAVENOUS | Status: AC
  Administered 2013-02-07: 36.4 mg via INTRAVENOUS
  Filled 2013-02-07: qty 18.2

## 2013-02-07 MED ORDER — ONDANSETRON 8 MG/50ML IVPB (CHCC)
8.0000 mg | Freq: Once | INTRAVENOUS | Status: AC
  Administered 2013-02-07: 8 mg via INTRAVENOUS

## 2013-02-07 MED ORDER — ONDANSETRON 8 MG/NS 50 ML IVPB
INTRAVENOUS | Status: AC
  Filled 2013-02-07: qty 8

## 2013-02-07 MED ORDER — SODIUM CHLORIDE 0.9 % IV SOLN
Freq: Once | INTRAVENOUS | Status: AC
  Administered 2013-02-07: 16:00:00 via INTRAVENOUS

## 2013-02-07 MED ORDER — SODIUM CHLORIDE 0.9 % IV SOLN
Freq: Once | INTRAVENOUS | Status: AC
  Administered 2013-02-07: 17:00:00 via INTRAVENOUS

## 2013-02-07 MED ORDER — DEXAMETHASONE SODIUM PHOSPHATE 20 MG/5ML IJ SOLN
INTRAMUSCULAR | Status: AC
  Filled 2013-02-07: qty 5

## 2013-02-07 MED ORDER — LIDOCAINE-PRILOCAINE 2.5-2.5 % EX CREA
TOPICAL_CREAM | CUTANEOUS | Status: AC
  Filled 2013-02-07: qty 5

## 2013-02-07 MED ORDER — SODIUM CHLORIDE 0.9 % IJ SOLN
10.0000 mL | INTRAMUSCULAR | Status: DC | PRN
  Administered 2013-02-07: 10 mL
  Filled 2013-02-07: qty 10

## 2013-02-07 NOTE — Telephone Encounter (Signed)
pt has visit today will get appts today

## 2013-02-07 NOTE — Patient Instructions (Signed)
[facility] Discharge Instructions for Patients Receiving Chemotherapy  Today you received the following chemotherapy agents: kyprolis, zometa  To help prevent nausea and vomiting after your treatment, we encourage you to take your nausea medication.  Take it as often as prescribed.     If you develop nausea and vomiting that is not controlled by your nausea medication, call the clinic. If it is after clinic hours your family physician or the after hours number for the clinic or go to the Emergency Department.   BELOW ARE SYMPTOMS THAT SHOULD BE REPORTED IMMEDIATELY:  *FEVER GREATER THAN 100.5 F  *CHILLS WITH OR WITHOUT FEVER  NAUSEA AND VOMITING THAT IS NOT CONTROLLED WITH YOUR NAUSEA MEDICATION  *UNUSUAL SHORTNESS OF BREATH  *UNUSUAL BRUISING OR BLEEDING  TENDERNESS IN MOUTH AND THROAT WITH OR WITHOUT PRESENCE OF ULCERS  *URINARY PROBLEMS  *BOWEL PROBLEMS  UNUSUAL RASH Items with * indicate a potential emergency and should be followed up as soon as possible.  One of the nurses will contact you 24 hours after your treatment. Please let the nurse know about any problems that you may have experienced. Feel free to call the clinic you have any questions or concerns. The clinic phone number is [phone].   I have been informed and understand all the instructions given to me. I know to contact the clinic, my physician, or go to the Emergency Department if any problems should occur. I do not have any questions at this time, but understand that I may call the clinic during office hours   should I have any questions or need assistance in obtaining follow up care.    __________________________________________  _____________  __________ Signature of Patient or Authorized Representative            Date                   Time    __________________________________________ Nurse's Signature  Carfilzomib injection (Kyprolis) What is this medicine? CARFILZOMIB is  a chemotherapy drug that works by slowing or stopping cancer cell growth. This medicine is used to treat multiple myeloma. This medicine may be used for other purposes; ask your health care provider or pharmacist if you have questions. What should I tell my health care provider before I take this medicine? They need to know if you have any of these conditions: -heart disease -irregular heartbeat -liver disease -lung or breathing disease -an unusual or allergic reaction to carfilzomib, or other medicines, foods, dyes, or preservatives -pregnant or trying to get pregnant -breast-feeding How should I use this medicine? This medicine is for injection or infusion into a vein. It is given by a health care professional in a hospital or clinic setting. Talk to your pediatrician regarding the use of this medicine in children. Special care may be needed. Overdosage: If you think you've taken too much of this medicine contact a poison control center or emergency room at once. Overdosage: If you think you have taken too much of this medicine contact a poison control center or emergency room at once. NOTE: This medicine is only for you. Do not share this medicine with others. What if I miss a dose? It is important not to miss your dose. Call your doctor or health care professional if you are unable to keep an appointment. What may interact with this medicine? Interactions are not expected. Give your health care provider a list of all the medicines, herbs, non-prescription drugs, or dietary supplements you  use. Also tell them if you smoke, drink alcohol, or use illegal drugs. Some items may interact with your medicine. This list may not describe all possible interactions. Give your health care provider a list of all the medicines, herbs, non-prescription drugs, or dietary supplements you use. Also tell them if you smoke, drink alcohol, or use illegal drugs. Some items may interact with your medicine. What  should I watch for while using this medicine? Your condition will be monitored carefully while you are receiving this medicine. Report any side effects. Continue your course of treatment even though you feel ill unless your doctor tells you to stop. Call your doctor or health care professional for advice if you get a fever, chills or sore throat, or other symptoms of a cold or flu. Do not treat yourself. Try to avoid being around people who are sick. Do not become pregnant while taking this medicine. Women should inform their doctor if they wish to become pregnant or think they might be pregnant. There is a potential for serious side effects to an unborn child. Talk to your health care professional or pharmacist for more information. Do not breast-feed an infant while taking this medicine. Check with your doctor or health care professional if you get an attack of severe diarrhea, nausea and vomiting, or if you sweat a lot. The loss of too much body fluid can make it dangerous for you to take this medicine. You may get dizzy. Do not drive, use machinery, or do anything that needs mental alertness until you know how this medicine affects you. Do not stand or sit up quickly, especially if you are an older patient. This reduces the risk of dizzy or fainting spells. What side effects may I notice from receiving this medicine? Side effects that you should report to your doctor or health care professional as soon as possible: -allergic reactions like skin rash, itching or hives, swelling of the face, lips, or tongue -breathing problems -chest pain or palpitationschest tightness -cough -dark urine -dizziness -feeling faint or lightheaded -fever or chills -general ill feeling or flu-like symptoms -light-colored stools -palpitations -right upper belly pain -swelling of the legs or ankles -unusual bleeding or bruising -unusually weak or tired -yellowing of the eyes or skin  Side effects that usually do  not require medical attention (Report these to your doctor or health care professional if they continue or are bothersome.): -diarrhea -headache -nausea, vomiting -tiredness This list may not describe all possible side effects. Call your doctor for medical advice about side effects. You may report side effects to FDA at 1-800-FDA-1088. Where should I keep my medicine? This drug is given in a hospital or clinic and will not be stored at home. NOTE: This sheet is a summary. It may not cover all possible information. If you have questions about this medicine, talk to your doctor, pharmacist, or health care provider.  2013, [employer]. (03/20/2011 4:16:30 PM)

## 2013-02-07 NOTE — Progress Notes (Signed)
02/07/13 @ 4:00 pm, SWOG 1304, Cycle 1, Day 1:  [name] into the [facility] to have labs, see Zollie Scale, PA and receive treatment with carfilzomib.  She is reportedly feeling well with no complaints.  She will receive Zometa as she did prior to enrolling in the study.  She had a recent dental examination and has no problems with her teeth or jaw.    Spoke with Adella Hare, RN in the infusion area about [name]'s treatment for today.  Provided her with a sign for the pump and the details of study drug administration.  All of her questions were answered.  [name] [facility] a Cath will be left accessed for treatment tomorrow.  02/08/13 @ 4:15 pm:  C1D2, [name] into the [facility] for Day 2 carfilzomib.  Spoke with Kathlee Nations, RN in the infusion area about her treatment and provided her with a sign.  02/14/13 @ 1:45 pm: C1D8, [name] into the [facility] for Day 8 treatment with carfilzobmib.  She reports feeling very well and seems less fatigued.  Spoke with Vincent Peyer, RN in the infusion about treatment and provided her with a sign. A CBC and Type and Hold will be drawn from her [facility] today as requested by Dr. Darnelle Catalan (not study required).  02/15/13 @ 10:00 am:  C1D9,  [name] into the [facility] for Day 9 treatment with carfilzomib.  No complaints at this time  Spoke with Tomasita Crumble, RN in the infusion room about treatment today and provided her with a sign.  02/21/13 @ 10:20 am:  C1D15:  [name] into the [facility] for labs, to see Zollie Scale, PA and to receive treatment with carfilzomib.  She reports doing very well, with no drug toxicities.  She will be treated today and tomorrow per protocol.  Spoke with Synetta Fail, RN in the infusion area about her treatment and provided her with a sign for the pump.  02/22/13 @ 08:45 am: C1D16:  [name] in for the last treatment with carfilzomib this cycle.  Spoke with Tommy Medal, RN in the infusion area and provided with a sign for  the pump

## 2013-02-07 NOTE — Progress Notes (Signed)
ID: [name]   DOB: [birth year]  MR#: [id number]  QMV#:[id number]  PCP: Laura Hoff, MD GYN: Laura [facility]ester SU: OTHER MD: [facility]ester  CHIEF COMPLAINT:  Multiple Myeloma   HISTORY OF PRESENT ILLNESS: Laura [facility]ester has a history of iron deficiency anemia secondary to menorrhagia. However, as this persisted despite near-total cessation of her menses and despite iron supplementation, she was referred to [facility] for GI evaluation. He found the patient's Hb to have decreased from 9.17 June 2011 to 8.3 07/29/2011. MCV was 95.9, ferritin 93.3, with a normal folate and negative ANA. Guaiacs were negative. Creatinine was 0.77.  t-transglutaminase IgA was normal at 2, but the total IgA was 6.9 g. Accordingly on 08/04/2011 an SPEP was obtained, showing an M-spike of 4.5 g, with a secondary M-spike of 0.4 g. Total protein was 10.0 with albumin 3.2, calcium 10.2. Bone survey 08/26/2011 showed multiple bone lesions consistent with myeloma and bone marrow biopsy 09/01/2011 confirmed the diagnosis with 75% myeloma cells in the marrow. Laura Hester' subsequent history is detailed below.  INTERVAL HISTORY: [facility] returns today for followup of her multiple myeloma accompanied by her husband Laura Hester. She was recently found to have disease progression and is here today to begin treatment according to the [facility] S1304 protocol.  Per protocol, she will receive carfilzomib on days 1, 2, 8, 9, 15, and 16 of each 28 day cycle. Cycle 1, she will receive a low dose of 20 mg per meter square, but if she tolerates cycle 1 well, we will increase this to 56 mg per meter square beginning with cycle 2. This is according to protocol, and she was randomized to the high-dose arm.   Physically, Laura [facility]ester is doing extremely well with very few complaints today. She is up-to-date with her Pap smear, had a flu shot last week, and also saw her dentist earlier this month. She received zoledronic acid monthly, and is due for her next  dose today.  REVIEW OF SYSTEMS: Laura Hester has had no fevers, chills, night sweats, or unexplained weight loss. She has only mild fatigue, but admits that she still doing 90 - 100% of her day-to-day activities. She does find that she is a little short of breath with exertion, likely associated with her anemia. She denies any abnormal bruising or bleeding. She's had no rashes or skin changes. Her appetite is good, and she's keeping herself very well hydrated. She's had no nausea, and no change in bowel or bladder habits. She denies any cough, orthopnea, chest pain, or palpitations. She's had no abnormal headaches or dizziness. She occasionally has some pain in the left hip. She's previously complaining of some pain in the right lower back, but that pain has actually resolved, and she denies any new or unusual myalgias, arthralgias, or bony pain elsewhere. She's had no peripheral swelling. She has no peripheral neuropathy whatsoever, grade 0.   A detailed review of systems today was otherwise stable   PAST MEDICAL HISTORY: Past Medical History  Diagnosis Date  . Fibrocystic breast disease   . Allergic rhinitis   . HTN (hypertension)     mild/ observation only  . Prediabetes   . SUI (stress urinary incontinence, female)   . Overweight (BMI 25.0-29.9)   . Multiple myeloma, stage 3 08/20/11 dx  . Anemia     resolved    PAST SURGICAL HISTORY: Past Surgical History  Procedure Laterality Date  . Cholecystectomy  1995  . Tonsillectomy and adenoidectomy    . Carpal tunnel release  FAMILY HISTORY Family History  Problem Relation Age of Onset  . Heart disease Father   . Breast cancer Mother   . Heart disease Mother   . Diabetes Paternal Grandfather   . Hypertension Maternal Grandmother   . Hypertension Brother   The patient's parents are in their mid-70s.Her mother has a remote history of breast cancer. She has two brothers, one her twin; no sisters. There is no other history of cancer in  the family to her knowledge  GYNECOLOGIC HISTORY: Menarche age 63, first live birth age 44, GX P26. Last period was January 2013, before that May 2012.  SOCIAL HISTORY: (Updated 02/07/2013) She is a Software engineer and taught kindergarten until 2012. Her husband Laura Hester, graduated from [employer] and works in Regulatory affairs officer.  Daughter Laura Hester works for [employer], married in 2013. Daughter Laura Hester studies and works in [employer] and comes home every other week and The patient attends the [employer]   ADVANCED DIRECTIVES: Not in place  HEALTH MAINTENANCE: (Updated 02/07/2013) History  Substance Use Topics  . Smoking status: Never Smoker   . Smokeless tobacco: Never Used  . Alcohol Use: No     Colonoscopy: 2009/Laura [facility]  PAP: March 2014, Dr. Seymour Hester  Bone density: never  Lipid panel: [facility]/ "excellent"  Mammography: June of 2014    Allergies  Allergen Reactions  . Wellbutrin [Bupropion Hcl] Hives    Current Outpatient Prescriptions  Medication Sig Dispense Refill  . LORazepam (ATIVAN) 0.5 MG tablet Take 1 tablet (0.5 mg total) by mouth at bedtime as needed for anxiety.  30 tablet  0  . naproxen sodium (ANAPROX) 220 MG tablet Take 220 mg by mouth 2 (two) times daily with a meal.      . omeprazole (PRILOSEC) 20 MG capsule Take 1 capsule (20 mg total) by mouth daily.  30 capsule  12  . potassium chloride SA (K-DUR,KLOR-CON) 20 MEQ tablet Take 1 tablet (20 mEq total) by mouth daily.  90 tablet  1  . sulfamethoxazole-trimethoprim (BACTRIM DS) 800-160 MG per tablet Take 2 tablets by mouth 2 (two) times a week. Monday  AND THURSDAY      . valACYclovir (VALTREX) 500 MG tablet Take 1 tablet (500 mg total) by mouth daily.  30 tablet  12   No current facility-administered medications for this visit.   Facility-Administered Medications Ordered in Other Visits  Medication Dose Route Frequency Provider Last Rate Last Dose  . 0.9 %  sodium chloride infusion   Intravenous  Once Laura Hester G Laura Tanton, PA-C      . 0.9 %  sodium chloride infusion   Intravenous Once Laura Ke Allegra Grana, PA-C      . Dexamethasone Sodium Phosphate (DECADRON) injection 20 mg  20 mg Intravenous Once Joee Iovine G Rhegan Trunnell, PA-C      . heparin lock flush 100 unit/mL  500 Units Intracatheter Once PRN Rochel Privett G Seith Aikey, PA-C      . ondansetron (ZOFRAN) IVPB 8 mg  8 mg Intravenous Once Usama Harkless G Mckaylee Dimalanta, PA-C      . sodium chloride 0.9 % injection 10 mL  10 mL Intracatheter PRN Adair Lauderback Allegra Grana, PA-C       OBJECTIVE: Middle-aged white woman in no acute distress Filed Vitals:   02/07/13 1520  BP: 148/77  Pulse: 108  Temp: 97.1 F (36.2 C)  Resp: 20     Body mass index is 30.65 kg/(m^2).     ECOG FS: 0    Zubrod Performance Scale: 0  Filed Weights   02/07/13 1520  Weight: 167 lb 9.6 oz (76.023 kg)   Physical Exam: HEENT:  Sclerae anicteric.  Oropharynx clear. Buccal mucosa is pink and moist NODES:  No cervical, supraclavicular, or axillary lymphadenopathy palpated.  BREAST EXAM:  Deferred. LUNGS:  Clear to auscultation bilaterally.  No wheezes or rhonchi HEART:  Regular rate and rhythm. Soft systolic murmur  ABDOMEN:  Soft, nontender.  Positive bowel sounds.  MSK:  No focal spinal tenderness to palpation. Full range of motion in both the upper and lower extremities EXTREMITIES:  No peripheral edema.   NEURO:  Nonfocal. Well oriented. Positive affect.    LAB RESULTS: Results for KYNDAHL, JABLON (MRN [id number]) as of 01/16/2013 08:43  Ref. Range 09/27/2012 08:42 10/11/2012 08:25 11/05/2012 08:16 12/06/2012 09:41 01/03/2013 09:01  Lambda Free Lght Chn Latest Range: 0.57-2.63 mg/dL 1.47 8.29 5.62 1.30 (H) 19.10 (H)     Lab Results  Component Value Date   WBC 5.9 02/07/2013   NEUTROABS 3.8 02/07/2013   HGB 9.6* 02/07/2013   HCT 30.3* 02/07/2013   MCV 89.4 02/07/2013   PLT 256 02/07/2013      Chemistry      Component Value Date/Time   NA 141 02/07/2013 1441   NA 143 12/01/2011 0836   K 3.8 02/07/2013 1441   K 4.0  12/01/2011 0836   CL 107 09/27/2012 0842   CL 105 12/01/2011 0836   CO2 21* 02/07/2013 1441   CO2 29 12/01/2011 0836   BUN 15.9 02/07/2013 1441   BUN 12 12/01/2011 0836   CREATININE 0.8 02/07/2013 1441   CREATININE 0.52 12/01/2011 0836      Component Value Date/Time   CALCIUM 9.4 02/07/2013 1441   CALCIUM 8.9 12/01/2011 0836   ALKPHOS 54 02/07/2013 1441   ALKPHOS 110 12/01/2011 0836   AST 32 02/07/2013 1441   AST 15 12/01/2011 0836   ALT 20 02/07/2013 1441   ALT 16 12/01/2011 0836   BILITOT 0.27 02/07/2013 1441   BILITOT 0.3 12/01/2011 0836      STUDIES: Nm Bone Scan Whole Body  01/26/2013   CLINICAL DATA:  Multiple myeloma  EXAM: NUCLEAR MEDICINE WHOLE BODY BONE SCAN  TECHNIQUE: Whole body anterior and posterior images were obtained approximately 3 hours after intravenous injection of radiopharmaceutical.  COMPARISON:  This are no comparison  RADIOPHARMACEUTICALS:  25.2 Technetium99 MDP  FINDINGS: Scattered foci of increased activity are seen in the anterior ribs bilaterally. Scattered areas of patchy uptake are seen in the thoracolumbar spine. No definite abnormal uptake in the visualized long bones of the appendicular skeleton.  IMPRESSION: Subtle scattered areas of increased uptake in the lower anterior ribs bilaterally. The distribution is somewhat unusual, given the symmetry in distribution, but multiple myeloma would be a consideration.  Scattered patchy uptake in the thoracolumbar spine is also somewhat nonspecific.  No abnormal uptake within the visualized long bones of the appendicular skeleton.   Electronically Signed   By: Kennith Center M.D.   On: 01/26/2013 15:55   Nm Pet Image Initial (pi) Skull Base To Thigh  01/28/2013   CLINICAL DATA:  Subsequent treatment strategy for wall.  EXAM: NUCLEAR MEDICINE PET SKULL BASE TO THIGH  FASTING BLOOD GLUCOSE:  Value:  96 mg/dl  TECHNIQUE: 86.5 mCi H-84 FDG was injected intravenously. CT data was obtained and used for attenuation correction and  anatomic localization only. (This was not acquired as a diagnostic CT examination.) Additional exam technical data entered on technologist worksheet.  COMPARISON:  No prior PET-CT. Nuclear medicine whole body bone scan 01/26/2013. Left hip x-rays 10/11/2012. Metastatic bone survey 08/26/2011.  FINDINGS: NECK  No hypermetabolic lymph nodes in the neck.  CHEST  No hypermetabolic mediastinal or hilar nodes. No suspicious pulmonary nodules on the CT scan.  ABDOMEN/PELVIS  No abnormal hypermetabolic activity within the liver, pancreas, adrenal glands, or spleen. No hypermetabolic lymph nodes in the abdomen or pelvis.  SKELETON  Widespread osseous hypermetabolic activity, the greatest activity involving: C7 vertebral body, right transverse process of T2, right lateral posterior T7 vertebral body, T12 vertebral body, left 1st sacral ala, midline 2nd sacral segment, left iliac bone, right acetabular roof, posterior column left acetabulum.  Hypermetabolic activity to a lesser degree is also present in the proximal humeri, proximal femora, scattered throughout the pelvis, numerous thoracic and lumbar vertebrae, and several ribs.  Index left 1st sacral aorta lesion has SUV max of 18.3.  Index midline S2 lesion has SUV max of 19.2.  Index posterior column left acetabulum lesion has SUV max of 18.3.  The only extraosseous hypermetabolic activity is in the subcutaneous tissues and the paraspinous muscle at the site of the recent biopsy of the posterior right iliac bone. Note is made of a large osteophyte at the L3-4 level with severe multifactorial spinal stenosis at this level and at L4-5.  IMPRESSION: 1. Widespread hypermetabolic osseous activity consistent with the current history of multiple myeloma. Please see above comments. 2. No evidence of extraosseous plasmacytoma or malignancy elsewhere.   Electronically Signed   By: Hulan Saas M.D.   On: 01/28/2013 15:00    Dg Bone Survey Met  02/03/2013   CLINICAL DATA:   History of multiple myeloma.  EXAM: METASTATIC BONE SURVEY  COMPARISON:  08/26/2011  FINDINGS: There are multiple small lucent bone lesions scattered throughout the axial and appendicular skeleton, most prominent in the skull, consistent with the given diagnosis of multiple myeloma. There is no radiographic evidence of progression since the prior bone survey.  IMPRESSION: Multiple lytic lesions are seen consistent with the given diagnosis of multiple myeloma. There has been no progression since the prior bone survey.   Electronically Signed   By: Amie Portland M.D.   On: 02/03/2013 14:34     ASSESSMENT: 57 y.o.  [place] woman presenting with anemia, found to have an IgA >6g, with an M-spike of 4.5 g (and a second M-spike of 0.4g), urine IFE showing IgA-lambda and lambda light chains, creatinine 0.77, calcium 10.0, albumin 3.1, and beta-2-microglobulin 5.51; with bone survey showing multiple myeloma lesions and bone marrow biopsy 09/01/2011 showing 75% myeloma cells. 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  (2) on prophylactic coumadin, discontinued as of 12/13/2011.  (3) zolendronic acid every 28 days, 09/19/2011 to 01/05/2012 (five doses)  (4) s/p neupogen mobilization October 2013, with a collection of 4.26 x10^6 CD34 positive cells  (5) status post melphalan at 200 mg/M2 on 02/23/2012, followed by autologous stem cell transplant the following day.  (6) on 06/07/2012 started maintenance bortezomib sq at 1.3 mg/M2 Q2w, with bactrim and acyclovir prophylaxis, the plan being to contuinue this for 2 years as per the HOVON trial  (7) zolendronic acid, given monthly resumed June 2014  (8) disease progression noted with a rise in the IgA lambda fraction September 2014  (9)  being  treated according to the [facility]  S1304 randomized to the high dose arm, and will be receiving carfilzomib  on days 1, 2, 8, 9, 15, and 16 of every 28 day cycle. Cycle 1 will be given at a reduced dose of 20 mg per meter square, with subsequent cycles given at 56 mg per meter square.   PLAN:  We reviewed the results of Smt' recent imaging studies, including her bone scan, PET scan, and bone survey. (These are detailed above.) Any of her labs are still pending, but we did review her CBC as well. We again reviewed her treatment plan according to the  [facility] S1304 protocol, and she will initiate day 1 cycle 1 today as planned.  She'll also receive her next monthly dose of zoledronic acid today, approved per protocol.  Typically, per protocol, we will check her labs only on days 1 and 15 of each cycle, and she will see [place] for physical exam on those days as well. Since her hemoglobin has dropped significantly over the past couple weeks, however, I plan on repeating a CBC again next week on day 8, November 3.  [facility] has all of her medications on hand, including antinausea medications. Of course she will be receiving dexamethasone which gives her insomnia, and accordingly I have given her prescription for lorazepam, 0.5 mg, one tablet at bedtime as needed.  The above plan was reviewed in detail with [facility] and [facility]ester today, both of whom voice understanding and agreement with this plan. They will call any changes or problems prior to her next appointment with me which will be on November 10.  She already has an appointment with Dr. Sheryn Bison for mid November, and whe will keep that appointment as planned to discuss whether a second autologous transplant perhaps with different preparatory drugs or consideration of an allogeneic transplant at some point might be feasible. Wenda does have 2 brothers who could be tested for HLA matching. Certainly if this is going to be a consideration, the sooner we start the better.   Lemar Bakos PA-C     02/07/2013

## 2013-02-08 ENCOUNTER — Other Ambulatory Visit: Payer: Self-pay | Admitting: *Deleted

## 2013-02-08 ENCOUNTER — Ambulatory Visit (HOSPITAL_BASED_OUTPATIENT_CLINIC_OR_DEPARTMENT_OTHER): Payer: BC Managed Care – PPO

## 2013-02-08 VITALS — BP 130/71 | HR 108 | Temp 97.1°F | Resp 16

## 2013-02-08 DIAGNOSIS — C9000 Multiple myeloma not having achieved remission: Secondary | ICD-10-CM

## 2013-02-08 DIAGNOSIS — Z5112 Encounter for antineoplastic immunotherapy: Secondary | ICD-10-CM

## 2013-02-08 LAB — BRAIN NATRIURETIC PEPTIDE: Brain Natriuretic Peptide: 77.9 pg/mL (ref 0.0–100.0)

## 2013-02-08 LAB — CK TOTAL AND CKMB (NOT AT ARMC)

## 2013-02-08 MED ORDER — INV-CARFILZOMIB CHEMO INJECTION 60 MG SWOG S1304
20.0000 mg/m2 | Freq: Once | INTRAVENOUS | Status: AC
  Administered 2013-02-08: 36.4 mg via INTRAVENOUS
  Filled 2013-02-08: qty 18.2

## 2013-02-08 MED ORDER — SODIUM CHLORIDE 0.9 % IV SOLN
Freq: Once | INTRAVENOUS | Status: AC
  Administered 2013-02-08: 17:00:00 via INTRAVENOUS

## 2013-02-08 MED ORDER — ONDANSETRON 8 MG/50ML IVPB (CHCC)
8.0000 mg | Freq: Once | INTRAVENOUS | Status: AC
  Administered 2013-02-08: 8 mg via INTRAVENOUS

## 2013-02-08 MED ORDER — DEXAMETHASONE SODIUM PHOSPHATE 20 MG/5ML IJ SOLN
20.0000 mg | Freq: Once | INTRAMUSCULAR | Status: AC
  Administered 2013-02-08: 20 mg via INTRAVENOUS

## 2013-02-08 MED ORDER — SODIUM CHLORIDE 0.9 % IJ SOLN
10.0000 mL | INTRAMUSCULAR | Status: DC | PRN
  Administered 2013-02-08: 10 mL
  Filled 2013-02-08: qty 10

## 2013-02-08 MED ORDER — LIDOCAINE-PRILOCAINE 2.5-2.5 % EX CREA: TOPICAL_CREAM | CUTANEOUS | Status: DC | PRN

## 2013-02-08 MED ORDER — ONDANSETRON 8 MG/NS 50 ML IVPB
INTRAVENOUS | Status: AC
  Filled 2013-02-08: qty 8

## 2013-02-08 MED ORDER — DEXAMETHASONE SODIUM PHOSPHATE 20 MG/5ML IJ SOLN
INTRAMUSCULAR | Status: AC
  Filled 2013-02-08: qty 5

## 2013-02-08 MED ORDER — HEPARIN SOD (PORK) LOCK FLUSH 100 UNIT/ML IV SOLN
500.0000 [IU] | Freq: Once | INTRAVENOUS | Status: AC | PRN
  Administered 2013-02-08: 500 [IU]
  Filled 2013-02-08: qty 5

## 2013-02-08 NOTE — Patient Instructions (Signed)
[facility] Discharge Instructions for Patients Receiving Chemotherapy  Today you received the following chemotherapy agents kyprolis  To help prevent nausea and vomiting after your treatment, we encourage you to take your nausea medication as needed   If you develop nausea and vomiting that is not controlled by your nausea medication, call the clinic.   BELOW ARE SYMPTOMS THAT SHOULD BE REPORTED IMMEDIATELY:  *FEVER GREATER THAN 100.5 F  *CHILLS WITH OR WITHOUT FEVER  NAUSEA AND VOMITING THAT IS NOT CONTROLLED WITH YOUR NAUSEA MEDICATION  *UNUSUAL SHORTNESS OF BREATH  *UNUSUAL BRUISING OR BLEEDING  TENDERNESS IN MOUTH AND THROAT WITH OR WITHOUT PRESENCE OF ULCERS  *URINARY PROBLEMS  *BOWEL PROBLEMS  UNUSUAL RASH Items with * indicate a potential emergency and should be followed up as soon as possible.  Feel free to call the clinic you have any questions or concerns. The clinic phone number is [phone].    

## 2013-02-09 ENCOUNTER — Telehealth: Payer: Self-pay | Admitting: *Deleted

## 2013-02-09 NOTE — Telephone Encounter (Signed)
Spoke with pt today for post chemo f/u call.  Pt stated she is eating ok, drinks lots of fluids as tolerated.  Denied nausea/vomiting;  Pt stated she has to take stool softener to help with bowel movement since Decadron caused constipation for pt.  Pt understood to call office with any new problems.  Pt aware of next return appts.

## 2013-02-14 ENCOUNTER — Other Ambulatory Visit (HOSPITAL_BASED_OUTPATIENT_CLINIC_OR_DEPARTMENT_OTHER): Payer: BC Managed Care – PPO | Admitting: Lab

## 2013-02-14 ENCOUNTER — Ambulatory Visit: Payer: BC Managed Care – PPO

## 2013-02-14 ENCOUNTER — Other Ambulatory Visit: Payer: BC Managed Care – PPO | Admitting: Lab

## 2013-02-14 ENCOUNTER — Ambulatory Visit (HOSPITAL_BASED_OUTPATIENT_CLINIC_OR_DEPARTMENT_OTHER): Payer: BC Managed Care – PPO

## 2013-02-14 VITALS — BP 147/59 | HR 111 | Temp 98.3°F | Resp 18

## 2013-02-14 DIAGNOSIS — C9000 Multiple myeloma not having achieved remission: Secondary | ICD-10-CM

## 2013-02-14 DIAGNOSIS — Z5112 Encounter for antineoplastic immunotherapy: Secondary | ICD-10-CM

## 2013-02-14 LAB — CBC & DIFF AND RETIC
EOS%: 1.2 % (ref 0.0–7.0)
Eosinophils Absolute: 0.1 10*3/uL (ref 0.0–0.5)
HCT: 31.8 % — ABNORMAL LOW (ref 34.8–46.6)
Immature Retic Fract: 17.6 % — ABNORMAL HIGH (ref 1.60–10.00)
LYMPH%: 16.2 % (ref 14.0–49.7)
MONO#: 0.9 10*3/uL (ref 0.1–0.9)
NEUT#: 5.5 10*3/uL (ref 1.5–6.5)
NEUT%: 71.3 % (ref 38.4–76.8)
Platelets: 249 10*3/uL (ref 145–400)
WBC: 7.7 10*3/uL (ref 3.9–10.3)

## 2013-02-14 LAB — HOLD TUBE, BLOOD BANK

## 2013-02-14 MED ORDER — HEPARIN SOD (PORK) LOCK FLUSH 100 UNIT/ML IV SOLN
500.0000 [IU] | Freq: Once | INTRAVENOUS | Status: AC | PRN
  Administered 2013-02-14: 500 [IU]
  Filled 2013-02-14: qty 5

## 2013-02-14 MED ORDER — ONDANSETRON 8 MG/50ML IVPB (CHCC)
8.0000 mg | Freq: Once | INTRAVENOUS | Status: AC
  Administered 2013-02-14: 8 mg via INTRAVENOUS

## 2013-02-14 MED ORDER — ONDANSETRON 8 MG/NS 50 ML IVPB
INTRAVENOUS | Status: AC
  Filled 2013-02-14: qty 8

## 2013-02-14 MED ORDER — SODIUM CHLORIDE 0.9 % IV SOLN
Freq: Once | INTRAVENOUS | Status: AC
  Administered 2013-02-14: 14:00:00 via INTRAVENOUS

## 2013-02-14 MED ORDER — DEXAMETHASONE SODIUM PHOSPHATE 20 MG/5ML IJ SOLN
INTRAMUSCULAR | Status: AC
  Filled 2013-02-14: qty 5

## 2013-02-14 MED ORDER — DEXAMETHASONE SODIUM PHOSPHATE 20 MG/5ML IJ SOLN
20.0000 mg | Freq: Once | INTRAMUSCULAR | Status: AC
  Administered 2013-02-14: 20 mg via INTRAVENOUS

## 2013-02-14 MED ORDER — INV-CARFILZOMIB CHEMO INJECTION 60 MG SWOG S1304
20.0000 mg/m2 | Freq: Once | INTRAVENOUS | Status: AC
  Administered 2013-02-14: 36.4 mg via INTRAVENOUS
  Filled 2013-02-14: qty 18.2

## 2013-02-14 MED ORDER — SODIUM CHLORIDE 0.9 % IJ SOLN
10.0000 mL | INTRAMUSCULAR | Status: DC | PRN
  Administered 2013-02-14: 10 mL
  Filled 2013-02-14: qty 10

## 2013-02-14 NOTE — Patient Instructions (Signed)
[facility] Discharge Instructions for Patients Receiving Chemotherapy  Today you received the following chemotherapy agents Kyprolis.  To help prevent nausea and vomiting after your treatment, we encourage you to take your nausea medication.   If you develop nausea and vomiting that is not controlled by your nausea medication, call the clinic.   BELOW ARE SYMPTOMS THAT SHOULD BE REPORTED IMMEDIATELY:  *FEVER GREATER THAN 100.5 F  *CHILLS WITH OR WITHOUT FEVER  NAUSEA AND VOMITING THAT IS NOT CONTROLLED WITH YOUR NAUSEA MEDICATION  *UNUSUAL SHORTNESS OF BREATH  *UNUSUAL BRUISING OR BLEEDING  TENDERNESS IN MOUTH AND THROAT WITH OR WITHOUT PRESENCE OF ULCERS  *URINARY PROBLEMS  *BOWEL PROBLEMS  UNUSUAL RASH Items with * indicate a potential emergency and should be followed up as soon as possible.  Feel free to call the clinic you have any questions or concerns. The clinic phone number is [phone].    

## 2013-02-15 ENCOUNTER — Ambulatory Visit (HOSPITAL_BASED_OUTPATIENT_CLINIC_OR_DEPARTMENT_OTHER): Payer: BC Managed Care – PPO

## 2013-02-15 VITALS — BP 140/56 | HR 109 | Temp 98.1°F | Resp 19

## 2013-02-15 DIAGNOSIS — C9000 Multiple myeloma not having achieved remission: Secondary | ICD-10-CM

## 2013-02-15 DIAGNOSIS — Z5112 Encounter for antineoplastic immunotherapy: Secondary | ICD-10-CM

## 2013-02-15 MED ORDER — DEXAMETHASONE SODIUM PHOSPHATE 20 MG/5ML IJ SOLN
20.0000 mg | Freq: Once | INTRAMUSCULAR | Status: AC
  Administered 2013-02-15: 20 mg via INTRAVENOUS

## 2013-02-15 MED ORDER — INV-CARFILZOMIB CHEMO INJECTION 60 MG SWOG S1304
20.0000 mg/m2 | Freq: Once | INTRAVENOUS | Status: AC
  Administered 2013-02-15: 36.4 mg via INTRAVENOUS
  Filled 2013-02-15: qty 18.2

## 2013-02-15 MED ORDER — SODIUM CHLORIDE 0.9 % IV SOLN: Freq: Once | INTRAVENOUS | Status: DC

## 2013-02-15 MED ORDER — SODIUM CHLORIDE 0.9 % IJ SOLN
10.0000 mL | INTRAMUSCULAR | Status: DC | PRN
  Administered 2013-02-15: 10 mL
  Filled 2013-02-15: qty 10

## 2013-02-15 MED ORDER — ONDANSETRON 8 MG/NS 50 ML IVPB
INTRAVENOUS | Status: AC
  Filled 2013-02-15: qty 8

## 2013-02-15 MED ORDER — SODIUM CHLORIDE 0.9 % IV SOLN
Freq: Once | INTRAVENOUS | Status: AC
  Administered 2013-02-15: 10:00:00 via INTRAVENOUS

## 2013-02-15 MED ORDER — DEXAMETHASONE SODIUM PHOSPHATE 20 MG/5ML IJ SOLN
INTRAMUSCULAR | Status: AC
  Filled 2013-02-15: qty 5

## 2013-02-15 MED ORDER — HEPARIN SOD (PORK) LOCK FLUSH 100 UNIT/ML IV SOLN
500.0000 [IU] | Freq: Once | INTRAVENOUS | Status: AC | PRN
  Administered 2013-02-15: 500 [IU]
  Filled 2013-02-15: qty 5

## 2013-02-15 MED ORDER — ONDANSETRON 8 MG/50ML IVPB (CHCC)
8.0000 mg | Freq: Once | INTRAVENOUS | Status: AC
  Administered 2013-02-15: 8 mg via INTRAVENOUS

## 2013-02-15 NOTE — Patient Instructions (Signed)
[facility] Discharge Instructions for Patients Receiving Chemotherapy  Today you received the following chemotherapy agents Kyprolis.  To help prevent nausea and vomiting after your treatment, we encourage you to take your nausea medication.   If you develop nausea and vomiting that is not controlled by your nausea medication, call the clinic.   BELOW ARE SYMPTOMS THAT SHOULD BE REPORTED IMMEDIATELY:  *FEVER GREATER THAN 100.5 F  *CHILLS WITH OR WITHOUT FEVER  NAUSEA AND VOMITING THAT IS NOT CONTROLLED WITH YOUR NAUSEA MEDICATION  *UNUSUAL SHORTNESS OF BREATH  *UNUSUAL BRUISING OR BLEEDING  TENDERNESS IN MOUTH AND THROAT WITH OR WITHOUT PRESENCE OF ULCERS  *URINARY PROBLEMS  *BOWEL PROBLEMS  UNUSUAL RASH Items with * indicate a potential emergency and should be followed up as soon as possible.  Feel free to call the clinic you have any questions or concerns. The clinic phone number is [phone].    

## 2013-02-21 ENCOUNTER — Ambulatory Visit: Payer: BC Managed Care – PPO

## 2013-02-21 ENCOUNTER — Encounter: Payer: Self-pay | Admitting: Physician Assistant

## 2013-02-21 ENCOUNTER — Other Ambulatory Visit (HOSPITAL_BASED_OUTPATIENT_CLINIC_OR_DEPARTMENT_OTHER): Payer: BC Managed Care – PPO

## 2013-02-21 ENCOUNTER — Other Ambulatory Visit: Payer: Self-pay | Admitting: Oncology

## 2013-02-21 ENCOUNTER — Telehealth: Payer: Self-pay | Admitting: *Deleted

## 2013-02-21 ENCOUNTER — Ambulatory Visit (HOSPITAL_BASED_OUTPATIENT_CLINIC_OR_DEPARTMENT_OTHER): Payer: BC Managed Care – PPO | Admitting: Physician Assistant

## 2013-02-21 ENCOUNTER — Ambulatory Visit (HOSPITAL_BASED_OUTPATIENT_CLINIC_OR_DEPARTMENT_OTHER): Payer: BC Managed Care – PPO

## 2013-02-21 VITALS — BP 138/77 | HR 112 | Temp 98.8°F | Resp 20 | Ht 62.0 in | Wt 167.8 lb

## 2013-02-21 DIAGNOSIS — C9000 Multiple myeloma not having achieved remission: Secondary | ICD-10-CM

## 2013-02-21 DIAGNOSIS — Z5112 Encounter for antineoplastic immunotherapy: Secondary | ICD-10-CM

## 2013-02-21 DIAGNOSIS — N63 Unspecified lump in unspecified breast: Secondary | ICD-10-CM

## 2013-02-21 DIAGNOSIS — D649 Anemia, unspecified: Secondary | ICD-10-CM

## 2013-02-21 LAB — CBC WITH DIFFERENTIAL/PLATELET
BASO%: 0.2 % (ref 0.0–2.0)
Basophils Absolute: 0 10e3/uL (ref 0.0–0.1)
EOS%: 0.6 % (ref 0.0–7.0)
Eosinophils Absolute: 0 10e3/uL (ref 0.0–0.5)
HCT: 33 % — ABNORMAL LOW (ref 34.8–46.6)
HGB: 10.3 g/dL — ABNORMAL LOW (ref 11.6–15.9)
LYMPH%: 15 % (ref 14.0–49.7)
MCH: 28 pg (ref 25.1–34.0)
MCHC: 31.2 g/dL — ABNORMAL LOW (ref 31.5–36.0)
MCV: 89.7 fL (ref 79.5–101.0)
MONO#: 0.6 10e3/uL (ref 0.1–0.9)
MONO%: 9.6 % (ref 0.0–14.0)
NEUT#: 4.9 10e3/uL (ref 1.5–6.5)
NEUT%: 74.6 % (ref 38.4–76.8)
Platelets: 270 10e3/uL (ref 145–400)
RBC: 3.68 10e6/uL — ABNORMAL LOW (ref 3.70–5.45)
RDW: 16 % — ABNORMAL HIGH (ref 11.2–14.5)
WBC: 6.6 10e3/uL (ref 3.9–10.3)
lymph#: 1 10e3/uL (ref 0.9–3.3)
nRBC: 0 % (ref 0–0)

## 2013-02-21 LAB — COMPREHENSIVE METABOLIC PANEL (CC13)
ALT: 48 U/L (ref 0–55)
AST: 39 U/L — ABNORMAL HIGH (ref 5–34)
Albumin: 2.7 g/dL — ABNORMAL LOW (ref 3.5–5.0)
Alkaline Phosphatase: 54 U/L (ref 40–150)
Anion Gap: 13 meq/L — ABNORMAL HIGH (ref 3–11)
BUN: 17.4 mg/dL (ref 7.0–26.0)
CO2: 21 meq/L — ABNORMAL LOW (ref 22–29)
Calcium: 8.7 mg/dL (ref 8.4–10.4)
Chloride: 106 meq/L (ref 98–109)
Creatinine: 0.7 mg/dL (ref 0.6–1.1)
Glucose: 117 mg/dL (ref 70–140)
Potassium: 4.1 meq/L (ref 3.5–5.1)
Sodium: 140 meq/L (ref 136–145)
Total Bilirubin: 0.41 mg/dL (ref 0.20–1.20)
Total Protein: 7.9 g/dL (ref 6.4–8.3)

## 2013-02-21 LAB — URIC ACID (CC13): Uric Acid, Serum: 5.6 mg/dl (ref 2.6–7.4)

## 2013-02-21 LAB — PHOSPHORUS: Phosphorus: 3.7 mg/dL (ref 2.3–4.6)

## 2013-02-21 LAB — LACTATE DEHYDROGENASE (CC13): LDH: 179 U/L (ref 125–245)

## 2013-02-21 MED ORDER — SODIUM CHLORIDE 0.9 % IV SOLN: Freq: Once | INTRAVENOUS | Status: DC

## 2013-02-21 MED ORDER — SODIUM CHLORIDE 0.9 % IV SOLN
Freq: Once | INTRAVENOUS | Status: AC
  Administered 2013-02-21: 11:00:00 via INTRAVENOUS

## 2013-02-21 MED ORDER — SODIUM CHLORIDE 0.9 % IJ SOLN
10.0000 mL | INTRAMUSCULAR | Status: DC | PRN
  Administered 2013-02-21: 10 mL
  Filled 2013-02-21: qty 10

## 2013-02-21 MED ORDER — INV-CARFILZOMIB CHEMO INJECTION 60 MG SWOG S1304
20.0000 mg/m2 | Freq: Once | INTRAVENOUS | Status: AC
  Administered 2013-02-21: 36.4 mg via INTRAVENOUS
  Filled 2013-02-21: qty 18.2

## 2013-02-21 MED ORDER — ONDANSETRON 8 MG/50ML IVPB (CHCC)
8.0000 mg | Freq: Once | INTRAVENOUS | Status: AC
  Administered 2013-02-21: 8 mg via INTRAVENOUS

## 2013-02-21 MED ORDER — DEXAMETHASONE SODIUM PHOSPHATE 20 MG/5ML IJ SOLN
INTRAMUSCULAR | Status: AC
  Filled 2013-02-21: qty 5

## 2013-02-21 MED ORDER — ONDANSETRON 8 MG/NS 50 ML IVPB
INTRAVENOUS | Status: AC
  Filled 2013-02-21: qty 8

## 2013-02-21 MED ORDER — HEPARIN SOD (PORK) LOCK FLUSH 100 UNIT/ML IV SOLN
500.0000 [IU] | Freq: Once | INTRAVENOUS | Status: AC | PRN
  Administered 2013-02-21: 500 [IU]
  Filled 2013-02-21: qty 5

## 2013-02-21 MED ORDER — DEXAMETHASONE SODIUM PHOSPHATE 20 MG/5ML IJ SOLN
20.0000 mg | Freq: Once | INTRAMUSCULAR | Status: AC
  Administered 2013-02-21: 20 mg via INTRAVENOUS

## 2013-02-21 NOTE — Progress Notes (Signed)
ID: Laura Hester   DOB: [birth year]  MR#: [id number]  CSN#:[id number]  PCP: Laura Hoff, MD GYN: Laura Hester SU: OTHER MD: Laura Hester  CHIEF COMPLAINT:  Multiple Myeloma   HISTORY OF PRESENT ILLNESS: Laura Hester has a history of iron deficiency anemia secondary to menorrhagia. However, as this persisted despite near-total cessation of her menses and despite iron supplementation, she was referred to Dr. Dorena Hester for GI evaluation. He found the patient's Hb to have decreased from 9.17 June 2011 to 8.3 07/29/2011. MCV was 95.9, ferritin 93.3, with a normal folate and negative ANA. Guaiacs were negative. Creatinine was 0.77.  t-transglutaminase IgA was normal at 2, but the total IgA was 6.9 g. Accordingly on 08/04/2011 an SPEP was obtained, showing an M-spike of 4.5 g, with a secondary M-spike of 0.4 g. Total protein was 10.0 with albumin 3.2, calcium 10.2. Bone survey 08/26/2011 showed multiple bone lesions consistent with myeloma and bone marrow biopsy 09/01/2011 confirmed the diagnosis with 75% myeloma cells in the marrow. [name] subsequent history is detailed below.  INTERVAL HISTORY: Laura Hester returns today for followup of her multiple myeloma accompanied by her husband Laura Hester. She is currently receiving treatment according to the [facility] S1304 protocol.  Per protocol, she is receiving carfilzomib on days 1, 2, 8, 9, 15, and 16 of each 28 day cycle. Today is day 15 cycle 1. During cycle 1, she will receive a low dose of 20 mg per meter square, but if she tolerates cycle 1 well, we will increase this to 56 mg per meter square beginning with cycle 2, according to protocol randomization to the high-dose arm.   Laura Hester has been tolerating the treatment extremely well, and in fact tells me she has no associated side effects. Her only concerns today are some slight bleeding when she blows her nose which is attributed to the cold weather and dry heat inside. She also bruised her left breast  approximately a week ago when moving some books and furniture at home. She had a large bruise on the upper outer quadrant of the left breast which she tells me is resolving, but now she feels a little "lump" in that area. This is also an associated with her myeloma, and unassociated with her treatment.   REVIEW OF SYSTEMS: Laura Hester has had no fevers, chills, night sweats, or unexplained weight loss. She has only mild fatigue, but is still able to do approximately 90 - 100% of her day-to-day activities. She denies any unexplained bruising or additional abnormal bleeding. She's had no rashes or skin changes. Her appetite is good, and she's  had no nausea, and no change in bowel or bladder habits. She denies any cough, orthopnea, chest pain, or palpitations. She's had no abnormal headaches or dizziness. She denies any new or unusual myalgias, arthralgias, or bony pain. She's had no peripheral swelling. She has no peripheral neuropathy whatsoever, currently grade 0.   A detailed review of systems today was otherwise stable   PAST MEDICAL HISTORY: Past Medical History  Diagnosis Date  . Fibrocystic breast disease   . Allergic rhinitis   . HTN (hypertension)     mild/ observation only  . Prediabetes   . SUI (stress urinary incontinence, female)   . Overweight (BMI 25.0-29.9)   . Multiple myeloma, stage 3 08/20/11 dx  . Anemia     resolved    PAST SURGICAL HISTORY: Past Surgical History  Procedure Laterality Date  . Cholecystectomy  1995  . Tonsillectomy and adenoidectomy    .  Carpal tunnel release      FAMILY HISTORY Family History  Problem Relation Age of Onset  . Heart disease Father   . Breast cancer Mother   . Heart disease Mother   . Diabetes Paternal Grandfather   . Hypertension Maternal Grandmother   . Hypertension Brother   The patient's parents are in their mid-70s.Her mother has a remote history of breast cancer. She has two brothers, one her twin; no sisters. There is no  other history of cancer in the family to her knowledge  GYNECOLOGIC HISTORY: Menarche age 66, first live birth age 49, GX P65. Last period was January 2013, before that May 2012.  SOCIAL HISTORY: (Updated 02/07/2013) She is a Software engineer and taught kindergarten until 2012. Her husband Laura Hester, graduated from [employer] and works in Regulatory affairs officer.  Daughter Laura Hester works for [employer], married in 2013. Daughter Laura Hester studies and works in [place] and comes home every other week and The patient attends the [employer]   ADVANCED DIRECTIVES: Not in place  HEALTH MAINTENANCE: (Updated 02/07/2013) History  Substance Use Topics  . Smoking status: Never Smoker   . Smokeless tobacco: Never Used  . Alcohol Use: No     Colonoscopy: 2009/[facility]  PAP: March 2014, Dr. Seymour Bars  Bone density: never  Lipid panel: [place]/ "excellent"  Mammography: June of 2014    Allergies  Allergen Reactions  . Wellbutrin [Bupropion Hcl] Hives    Current Outpatient Prescriptions  Medication Sig Dispense Refill  . lidocaine-prilocaine (EMLA) cream Apply topically as needed.  30 g  3  . LORazepam (ATIVAN) 0.5 MG tablet Take 1 tablet (0.5 mg total) by mouth at bedtime as needed for anxiety.  30 tablet  0  . omeprazole (PRILOSEC) 20 MG capsule Take 1 capsule (20 mg total) by mouth daily.  30 capsule  12  . potassium chloride SA (K-DUR,KLOR-CON) 20 MEQ tablet Take 1 tablet (20 mEq total) by mouth daily.  90 tablet  1  . sulfamethoxazole-trimethoprim (BACTRIM DS) 800-160 MG per tablet Take 2 tablets by mouth 2 (two) times a week. Monday  AND THURSDAY      . valACYclovir (VALTREX) 500 MG tablet Take 1 tablet (500 mg total) by mouth daily.  30 tablet  12  . naproxen sodium (ANAPROX) 220 MG tablet Take 220 mg by mouth 2 (two) times daily with a meal.       No current facility-administered medications for this visit.   Facility-Administered Medications Ordered in Other Visits  Medication  Dose Route Frequency Provider Last Rate Last Dose  . 0.9 %  sodium chloride infusion   Intravenous Once Laura Cranmore G Detavious Rinn, PA-C      . sodium chloride 0.9 % injection 10 mL  10 mL Intracatheter PRN Laura Mom Allegra Grana, PA-C   10 mL at 02/21/13 1151   OBJECTIVE: Middle-aged white woman in no acute distress Filed Vitals:   02/21/13 0931  BP: 138/77  Pulse: 112  Temp: 98.8 F (37.1 C)  Resp: 20     Body mass index is 30.68 kg/(m^2).     ECOG FS: 0    Zubrod Performance Scale: 0 Filed Weights   02/21/13 0931  Weight: 167 lb 12.8 oz (76.114 kg)   Physical Exam: HEENT:  Sclerae anicteric.  Oropharynx clear. No ulcerations or evidence of candidiasis noted. NODES:  No cervical, supraclavicular, or axillary lymphadenopathy palpated.  BREAST EXAM:  The left breast was examined. There is a resolving ecchymoses in the upper-outer  quadrant of the left breast, and just beneath the bruised area is an area of thickening measuring between 1 and 1.5 cm at the most. This is not particularly nodular, but is an obvious palpable thickening. LUNGS:  Clear to auscultation bilaterally.  No wheezes or rhonchi HEART:  Regular rate and rhythm. Soft systolic murmur, stable ABDOMEN:  Soft, nontender. No organomegaly palpated. Positive bowel sounds.  MSK:  No focal spinal tenderness to palpation. Full range of motion in both the upper and lower extremities EXTREMITIES:  No peripheral edema.   NEURO:  Nonfocal. Well oriented. Positive affect.    LAB RESULTS: Results for AJOONI, KARAM (MRN [id number]) as of 01/16/2013 08:43  Ref. Range 09/27/2012 08:42 10/11/2012 08:25 11/05/2012 08:16 12/06/2012 09:41 01/03/2013 09:01  Lambda Free Lght Chn Latest Range: 0.57-2.63 mg/dL 1.47 8.29 5.62 1.30 (H) 19.10 (H)     Lab Results  Component Value Date   WBC 6.6 02/21/2013   NEUTROABS 4.9 02/21/2013   HGB 10.3* 02/21/2013   HCT 33.0* 02/21/2013   MCV 89.7 02/21/2013   PLT 270 02/21/2013      Chemistry      Component Value  Date/Time   NA 140 02/21/2013 0843   NA 143 12/01/2011 0836   K 4.1 02/21/2013 0843   K 4.0 12/01/2011 0836   CL 107 09/27/2012 0842   CL 105 12/01/2011 0836   CO2 21* 02/21/2013 0843   CO2 29 12/01/2011 0836   BUN 17.4 02/21/2013 0843   BUN 12 12/01/2011 0836   CREATININE 0.7 02/21/2013 0843   CREATININE 0.52 12/01/2011 0836      Component Value Date/Time   CALCIUM 8.7 02/21/2013 0843   CALCIUM 8.9 12/01/2011 0836   ALKPHOS 54 02/21/2013 0843   ALKPHOS 110 12/01/2011 0836   AST 39* 02/21/2013 0843   AST 15 12/01/2011 0836   ALT 48 02/21/2013 0843   ALT 16 12/01/2011 0836   BILITOT 0.41 02/21/2013 0843   BILITOT 0.3 12/01/2011 0836      STUDIES:  Nm Bone Scan Whole Body  01/26/2013   CLINICAL DATA:  Multiple myeloma  EXAM: NUCLEAR MEDICINE WHOLE BODY BONE SCAN  TECHNIQUE: Whole body anterior and posterior images were obtained approximately 3 hours after intravenous injection of radiopharmaceutical.  COMPARISON:  This are no comparison  RADIOPHARMACEUTICALS:  25.2 Technetium99 MDP  FINDINGS: Scattered foci of increased activity are seen in the anterior ribs bilaterally. Scattered areas of patchy uptake are seen in the thoracolumbar spine. No definite abnormal uptake in the visualized long bones of the appendicular skeleton.  IMPRESSION: Subtle scattered areas of increased uptake in the lower anterior ribs bilaterally. The distribution is somewhat unusual, given the symmetry in distribution, but multiple myeloma would be a consideration.  Scattered patchy uptake in the thoracolumbar spine is also somewhat nonspecific.  No abnormal uptake within the visualized long bones of the appendicular skeleton.   Electronically Signed   By: Kennith Center M.D.   On: 01/26/2013 15:55   Nm Pet Image Initial (pi) Skull Base To Thigh  01/28/2013   CLINICAL DATA:  Subsequent treatment strategy for wall.  EXAM: NUCLEAR MEDICINE PET SKULL BASE TO THIGH  FASTING BLOOD GLUCOSE:  Value:  96 mg/dl  TECHNIQUE: 86.5 mCi  H-84 FDG was injected intravenously. CT data was obtained and used for attenuation correction and anatomic localization only. (This was not acquired as a diagnostic CT examination.) Additional exam technical data entered on technologist worksheet.  COMPARISON:  No prior PET-CT. Nuclear medicine  whole body bone scan 01/26/2013. Left hip x-rays 10/11/2012. Metastatic bone survey 08/26/2011.  FINDINGS: NECK  No hypermetabolic lymph nodes in the neck.  CHEST  No hypermetabolic mediastinal or hilar nodes. No suspicious pulmonary nodules on the CT scan.  ABDOMEN/PELVIS  No abnormal hypermetabolic activity within the liver, pancreas, adrenal glands, or spleen. No hypermetabolic lymph nodes in the abdomen or pelvis.  SKELETON  Widespread osseous hypermetabolic activity, the greatest activity involving: C7 vertebral body, right transverse process of T2, right lateral posterior T7 vertebral body, T12 vertebral body, left 1st sacral ala, midline 2nd sacral segment, left iliac bone, right acetabular roof, posterior column left acetabulum.  Hypermetabolic activity to a lesser degree is also present in the proximal humeri, proximal femora, scattered throughout the pelvis, numerous thoracic and lumbar vertebrae, and several ribs.  Index left 1st sacral aorta lesion has SUV max of 18.3.  Index midline S2 lesion has SUV max of 19.2.  Index posterior column left acetabulum lesion has SUV max of 18.3.  The only extraosseous hypermetabolic activity is in the subcutaneous tissues and the paraspinous muscle at the site of the recent biopsy of the posterior right iliac bone. Note is made of a large osteophyte at the L3-4 level with severe multifactorial spinal stenosis at this level and at L4-5.  IMPRESSION: 1. Widespread hypermetabolic osseous activity consistent with the current history of multiple myeloma. Please see above comments. 2. No evidence of extraosseous plasmacytoma or malignancy elsewhere.   Electronically Signed   By:  Hulan Saas M.D.   On: 01/28/2013 15:00   Dg Bone Survey Met  02/03/2013   CLINICAL DATA:  History of multiple myeloma.  EXAM: METASTATIC BONE SURVEY  COMPARISON:  08/26/2011  FINDINGS: There are multiple small lucent bone lesions scattered throughout the axial and appendicular skeleton, most prominent in the skull, consistent with the given diagnosis of multiple myeloma. There is no radiographic evidence of progression since the prior bone survey.  IMPRESSION: Multiple lytic lesions are seen consistent with the given diagnosis of multiple myeloma. There has been no progression since the prior bone survey.   Electronically Signed   By: Amie Portland M.D.   On: 02/03/2013 14:34       ASSESSMENT: 57 y.o.  [place] woman presenting with anemia, found to have an IgA >6g, with an M-spike of 4.5 g (and a second M-spike of 0.4g), urine IFE showing IgA-lambda and lambda light chains, creatinine 0.77, calcium 10.0, albumin 3.1, and beta-2-microglobulin 5.51; with bone survey showing multiple myeloma lesions and bone marrow biopsy 09/01/2011 showing 75% myeloma cells. 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  (2) on prophylactic coumadin, discontinued as of 12/13/2011.  (3) zolendronic acid every 28 days, 09/19/2011 to 01/05/2012 (five doses)  (4) s/p neupogen mobilization October 2013, with a collection of 4.26 x10^6 CD34 positive cells  (5) status post melphalan at 200 mg/M2 on 02/23/2012, followed by autologous stem cell transplant the following day.  (6) on 06/07/2012 started maintenance bortezomib sq at 1.3 mg/M2 Q2w, with bactrim and acyclovir prophylaxis, the plan being to contuinue this for 2 years as per the HOVON trial  (7) zolendronic acid, given monthly resumed June 2014  (8) disease progression  noted with a rise in the IgA lambda fraction September 2014  (9)  being treated according to  the SWOG S1304 randomized to the high dose arm, and will be receiving carfilzomib  on days 1, 2, 8, 9, 15, and 16 of every 28 day cycle. Cycle 1 will be given at a reduced dose of 20 mg per meter square, with subsequent cycles given at 56 mg per meter square.   PLAN:  Kiosha appears to be tolerating the current fields and is extremely well, and we'll proceed as planned today for day 5 cycle 1. We have scheduled her all the way out to the beginning of cycle 3. Per her request (due to the fact that her copayments are $70 per visit) Nakaila would like to see [place] only on a monthly basis on day 1 of each cycle. This is appropriate per protocol which requires labs and physical exam only on day 1 of each cycle.   Accordingly, we will schedule her for labs and followups only on day 1, with day 1 cycle 2 being November 24, and day 1 cycle 3 being December 22. Laura Hester does know, of course, to contact us with any new symptoms or side effects, any increased fatigue, or any additional changes or problems and we will recheck her labs and see her sooner.   She'll keep an eye on the changes in the left breast, and we'll let me know if this area changes or worsens. I will examine her left breast again when she returns in 2 weeks. Certainly we can proceed with a mammogram and/or ultrasound if necessary, but I do believe this is going to be secondary to trauma.  Of note, she'll continue to receive monthly zoledronic acid as well, last given 02/07/2013, and due again in 2 weeks on November 24.  She already has an appointment with Dr. Sheryn Bison for mid November, and whe will keep that appointment as planned to discuss whether a second autologous transplant perhaps with different preparatory drugs or consideration of an allogeneic transplant at some point might be feasible. Aijah does have 2 brothers who could be tested for HLA  matching. Certainly if this is going to be a consideration, the sooner we start the better.   Abdiaziz Klahn PA-C    02/21/2013

## 2013-02-21 NOTE — Telephone Encounter (Signed)
appts made and printed. Pt is aware that her tx's will be added. i emailed MW to add the tx's...td

## 2013-02-21 NOTE — Telephone Encounter (Signed)
Per staff message and POF I have scheduled appts.  JMW  

## 2013-02-21 NOTE — Patient Instructions (Signed)
[facility] Discharge Instructions for Patients Receiving Chemotherapy  Today you received the following chemotherapy agents Kyprolis.  To help prevent nausea and vomiting after your treatment, we encourage you to take your nausea medication.   If you develop nausea and vomiting that is not controlled by your nausea medication, call the clinic.   BELOW ARE SYMPTOMS THAT SHOULD BE REPORTED IMMEDIATELY:  *FEVER GREATER THAN 100.5 F  *CHILLS WITH OR WITHOUT FEVER  NAUSEA AND VOMITING THAT IS NOT CONTROLLED WITH YOUR NAUSEA MEDICATION  *UNUSUAL SHORTNESS OF BREATH  *UNUSUAL BRUISING OR BLEEDING  TENDERNESS IN MOUTH AND THROAT WITH OR WITHOUT PRESENCE OF ULCERS  *URINARY PROBLEMS  *BOWEL PROBLEMS  UNUSUAL RASH Items with * indicate a potential emergency and should be followed up as soon as possible.  Feel free to call the clinic you have any questions or concerns. The clinic phone number is [phone].    

## 2013-02-22 ENCOUNTER — Ambulatory Visit (HOSPITAL_BASED_OUTPATIENT_CLINIC_OR_DEPARTMENT_OTHER): Payer: BC Managed Care – PPO

## 2013-02-22 VITALS — BP 118/65 | HR 103 | Temp 98.1°F

## 2013-02-22 DIAGNOSIS — Z5112 Encounter for antineoplastic immunotherapy: Secondary | ICD-10-CM

## 2013-02-22 DIAGNOSIS — C9000 Multiple myeloma not having achieved remission: Secondary | ICD-10-CM

## 2013-02-22 MED ORDER — ONDANSETRON 8 MG/NS 50 ML IVPB
INTRAVENOUS | Status: AC
  Filled 2013-02-22: qty 8

## 2013-02-22 MED ORDER — ONDANSETRON 8 MG/50ML IVPB (CHCC)
8.0000 mg | Freq: Once | INTRAVENOUS | Status: AC
  Administered 2013-02-22: 8 mg via INTRAVENOUS

## 2013-02-22 MED ORDER — DEXAMETHASONE SODIUM PHOSPHATE 20 MG/5ML IJ SOLN
20.0000 mg | Freq: Once | INTRAMUSCULAR | Status: AC
  Administered 2013-02-22: 20 mg via INTRAVENOUS

## 2013-02-22 MED ORDER — SODIUM CHLORIDE 0.9 % IV SOLN
Freq: Once | INTRAVENOUS | Status: AC
  Administered 2013-02-22: 09:00:00 via INTRAVENOUS

## 2013-02-22 MED ORDER — DEXAMETHASONE SODIUM PHOSPHATE 20 MG/5ML IJ SOLN
INTRAMUSCULAR | Status: AC
  Filled 2013-02-22: qty 5

## 2013-02-22 MED ORDER — HEPARIN SOD (PORK) LOCK FLUSH 100 UNIT/ML IV SOLN
500.0000 [IU] | Freq: Once | INTRAVENOUS | Status: AC | PRN
  Administered 2013-02-22: 500 [IU]
  Filled 2013-02-22: qty 5

## 2013-02-22 MED ORDER — SODIUM CHLORIDE 0.9 % IJ SOLN
10.0000 mL | INTRAMUSCULAR | Status: DC | PRN
  Administered 2013-02-22: 10 mL
  Filled 2013-02-22: qty 10

## 2013-02-22 MED ORDER — INV-CARFILZOMIB CHEMO INJECTION 60 MG SWOG S1304
20.0000 mg/m2 | Freq: Once | INTRAVENOUS | Status: AC
  Administered 2013-02-22: 36.4 mg via INTRAVENOUS
  Filled 2013-02-22: qty 18.2

## 2013-02-22 NOTE — Patient Instructions (Signed)
[facility] Discharge Instructions for Patients Receiving Chemotherapy  Today you received the following chemotherapy agents Kyprolis To help prevent nausea and vomiting after your treatment, we encourage you to take your nausea medication as prescribed.  If you develop nausea and vomiting that is not controlled by your nausea medication, call the clinic.   BELOW ARE SYMPTOMS THAT SHOULD BE REPORTED IMMEDIATELY:  *FEVER GREATER THAN 100.5 F  *CHILLS WITH OR WITHOUT FEVER  NAUSEA AND VOMITING THAT IS NOT CONTROLLED WITH YOUR NAUSEA MEDICATION  *UNUSUAL SHORTNESS OF BREATH  *UNUSUAL BRUISING OR BLEEDING  TENDERNESS IN MOUTH AND THROAT WITH OR WITHOUT PRESENCE OF ULCERS  *URINARY PROBLEMS  *BOWEL PROBLEMS  UNUSUAL RASH Items with * indicate a potential emergency and should be followed up as soon as possible.  Feel free to call the clinic you have any questions or concerns. The clinic phone number is [phone].    

## 2013-02-28 ENCOUNTER — Other Ambulatory Visit: Payer: BC Managed Care – PPO | Admitting: Lab

## 2013-03-07 ENCOUNTER — Other Ambulatory Visit (HOSPITAL_BASED_OUTPATIENT_CLINIC_OR_DEPARTMENT_OTHER): Payer: BC Managed Care – PPO | Admitting: Lab

## 2013-03-07 ENCOUNTER — Ambulatory Visit: Payer: BC Managed Care – PPO

## 2013-03-07 ENCOUNTER — Ambulatory Visit (HOSPITAL_BASED_OUTPATIENT_CLINIC_OR_DEPARTMENT_OTHER): Payer: BC Managed Care – PPO | Admitting: Physician Assistant

## 2013-03-07 ENCOUNTER — Encounter: Payer: Self-pay | Admitting: Physician Assistant

## 2013-03-07 ENCOUNTER — Ambulatory Visit (HOSPITAL_BASED_OUTPATIENT_CLINIC_OR_DEPARTMENT_OTHER): Payer: BC Managed Care – PPO

## 2013-03-07 ENCOUNTER — Encounter: Payer: Self-pay | Admitting: *Deleted

## 2013-03-07 VITALS — BP 146/79 | HR 111 | Temp 99.0°F | Resp 19 | Ht 62.0 in | Wt 165.4 lb

## 2013-03-07 DIAGNOSIS — Z5112 Encounter for antineoplastic immunotherapy: Secondary | ICD-10-CM

## 2013-03-07 DIAGNOSIS — Z95828 Presence of other vascular implants and grafts: Secondary | ICD-10-CM

## 2013-03-07 DIAGNOSIS — C9000 Multiple myeloma not having achieved remission: Secondary | ICD-10-CM

## 2013-03-07 DIAGNOSIS — D649 Anemia, unspecified: Secondary | ICD-10-CM

## 2013-03-07 LAB — CBC WITH DIFFERENTIAL/PLATELET
BASO%: 0.3 % (ref 0.0–2.0)
Basophils Absolute: 0 10*3/uL (ref 0.0–0.1)
EOS%: 1.2 % (ref 0.0–7.0)
HCT: 31.8 % — ABNORMAL LOW (ref 34.8–46.6)
HGB: 9.9 g/dL — ABNORMAL LOW (ref 11.6–15.9)
LYMPH%: 23.1 % (ref 14.0–49.7)
MCH: 27.7 pg (ref 25.1–34.0)
MCHC: 31.1 g/dL — ABNORMAL LOW (ref 31.5–36.0)
MCV: 89.1 fL (ref 79.5–101.0)
MONO%: 10.1 % (ref 0.0–14.0)
NEUT#: 4.3 10*3/uL (ref 1.5–6.5)
NEUT%: 65.3 % (ref 38.4–76.8)
Platelets: 306 10*3/uL (ref 145–400)
RDW: 16.5 % — ABNORMAL HIGH (ref 11.2–14.5)
lymph#: 1.5 10*3/uL (ref 0.9–3.3)

## 2013-03-07 LAB — COMPREHENSIVE METABOLIC PANEL (CC13)
AST: 24 U/L (ref 5–34)
Albumin: 2.8 g/dL — ABNORMAL LOW (ref 3.5–5.0)
Alkaline Phosphatase: 53 U/L (ref 40–150)
BUN: 10.7 mg/dL (ref 7.0–26.0)
Chloride: 106 mEq/L (ref 98–109)
Creatinine: 0.7 mg/dL (ref 0.6–1.1)
Glucose: 118 mg/dl (ref 70–140)
Potassium: 4 mEq/L (ref 3.5–5.1)
Total Bilirubin: 0.25 mg/dL (ref 0.20–1.20)

## 2013-03-07 LAB — PHOSPHORUS: Phosphorus: 4.5 mg/dL (ref 2.3–4.6)

## 2013-03-07 LAB — MAGNESIUM (CC13): Magnesium: 2 mg/dl (ref 1.5–2.5)

## 2013-03-07 MED ORDER — SODIUM CHLORIDE 0.9 % IJ SOLN
10.0000 mL | INTRAMUSCULAR | Status: DC | PRN
  Administered 2013-03-07: 10 mL
  Filled 2013-03-07: qty 10

## 2013-03-07 MED ORDER — SODIUM CHLORIDE 0.9 % IJ SOLN
10.0000 mL | INTRAMUSCULAR | Status: DC | PRN
  Administered 2013-03-07: 10 mL via INTRAVENOUS
  Filled 2013-03-07: qty 10

## 2013-03-07 MED ORDER — ZOLEDRONIC ACID 4 MG/100ML IV SOLN
4.0000 mg | Freq: Once | INTRAVENOUS | Status: AC
  Administered 2013-03-07: 4 mg via INTRAVENOUS
  Filled 2013-03-07: qty 100

## 2013-03-07 MED ORDER — ONDANSETRON 8 MG/NS 50 ML IVPB
INTRAVENOUS | Status: AC
  Filled 2013-03-07: qty 8

## 2013-03-07 MED ORDER — SODIUM CHLORIDE 0.9 % IV SOLN
Freq: Once | INTRAVENOUS | Status: AC
  Administered 2013-03-07: 13:00:00 via INTRAVENOUS

## 2013-03-07 MED ORDER — DEXTROSE 5 % IV SOLN
56.0000 mg/m2 | Freq: Once | INTRAVENOUS | Status: AC
  Administered 2013-03-07: 102 mg via INTRAVENOUS
  Filled 2013-03-07: qty 51

## 2013-03-07 MED ORDER — DEXAMETHASONE SODIUM PHOSPHATE 20 MG/5ML IJ SOLN
20.0000 mg | Freq: Once | INTRAMUSCULAR | Status: AC
  Administered 2013-03-07: 20 mg via INTRAVENOUS

## 2013-03-07 MED ORDER — DEXAMETHASONE SODIUM PHOSPHATE 20 MG/5ML IJ SOLN
INTRAMUSCULAR | Status: AC
  Filled 2013-03-07: qty 5

## 2013-03-07 MED ORDER — HEPARIN SOD (PORK) LOCK FLUSH 100 UNIT/ML IV SOLN
500.0000 [IU] | Freq: Once | INTRAVENOUS | Status: AC
  Administered 2013-03-07: 500 [IU] via INTRAVENOUS
  Filled 2013-03-07: qty 5

## 2013-03-07 MED ORDER — HEPARIN SOD (PORK) LOCK FLUSH 100 UNIT/ML IV SOLN
500.0000 [IU] | Freq: Once | INTRAVENOUS | Status: AC | PRN
  Administered 2013-03-07: 500 [IU]
  Filled 2013-03-07: qty 5

## 2013-03-07 MED ORDER — ONDANSETRON 8 MG/50ML IVPB (CHCC)
8.0000 mg | Freq: Once | INTRAVENOUS | Status: AC
  Administered 2013-03-07: 8 mg via INTRAVENOUS

## 2013-03-07 NOTE — Progress Notes (Signed)
Patient left accessed for treatment tomorrow.

## 2013-03-07 NOTE — Patient Instructions (Signed)
[facility] Discharge Instructions for Patients Receiving Chemotherapy  Today you received the following chemotherapy agents KYPROLIS, ZOMETA  To help prevent nausea and vomiting after your treatment, we encourage you to take your nausea medication IF NEEDED   If you develop nausea and vomiting that is not controlled by your nausea medication, call the clinic.   BELOW ARE SYMPTOMS THAT SHOULD BE REPORTED IMMEDIATELY:  *FEVER GREATER THAN 100.5 F  *CHILLS WITH OR WITHOUT FEVER  NAUSEA AND VOMITING THAT IS NOT CONTROLLED WITH YOUR NAUSEA MEDICATION  *UNUSUAL SHORTNESS OF BREATH  *UNUSUAL BRUISING OR BLEEDING  TENDERNESS IN MOUTH AND THROAT WITH OR WITHOUT PRESENCE OF ULCERS  *URINARY PROBLEMS  *BOWEL PROBLEMS  UNUSUAL RASH Items with * indicate a potential emergency and should be followed up as soon as possible.  Feel free to call the clinic you have any questions or concerns. The clinic phone number is [phone].

## 2013-03-07 NOTE — Progress Notes (Signed)
03/07/13 @ 12:30 pm SWOG Z6109, Cycle 2, Day 1:  Mrs. Dawkins into the [facility] for the start of Cycle 2.  She is reportedly doing well, looks well, remains active and fit.  She denies any adverse events during Cycle 1, and all labs are appropriate to proceed with dose escalation of carfilzomib per the study plan.  She had a complete assessment by Zollie Scale, PA.  She denies any adverse events whatsoever. Spoke with Garald Braver, RN in the infusion room regarding the treatment for today and provided her with a sign for the pump.  03/08/13 @ 2:00pm, C2 D2:  Mrs. Hallinan into the [facility] for day two of high dose carfilzomib.  She reports doing well with no complaints.  Spoke with Adella Hare, RN in the infusion area about patient's treatment today and provided her with a sign for the pump.  03/14/13 @ 12:10 pm, C2 D8: Mrs. Nightengale into the [facility] for treatment with high dose carfilzomib.  Spoke with her in the lobby and she reports feeling great. Spoke with Kem Boroughs, RN in the infusion room and provided her with a sign for the pump.  03/15/13 @ 3:00pm, C2 D9:  Mrs. Truxillo into the [facility] for treatment with high dose carfilzomib.  She reports feeling well with no complaints.  Spoke with Shellee Milo, RN in the infusion area and gave her a sign for the pump with details of protocol treatment.  03/21/13 @1 :00 pm, C2 D15:  Mrs. Grays into the [facility] for treatment with high dose carfilzomib.  She is doing well by her report.  Dr. Darnelle Catalan would prefer that she have labs collected 3 days early for disease assessment at C3 D1.  She can have those done on 04/01/13, but the study still requires that the CBC and CMET be done on C3 D1.  She is agreeable to that plan.  Appointment made for 04/01/13.  She will return her 24 hour urine that morning and have the SPEP & IFE with QIG, the IgD, IgE and serum free light chains done that day. Spoke with Ernesta Amble, RN in the infusion room regarding treatment for today and  gave her a sign for the pump. Emphasized the duration of carfilzomib.  03/22/13 @ 1:30 pm, C2 D16:   Mrs. Eckmann into the [facility] for treatment with carfilzomib.  She picked up her 24-hour urine collection jug and instructions.  She will complete the collection and return it on 04/01/13 along with all other disease assessment labs. Provided her with a written schedule for December appointments. Spoke with Baird Cancer, RN in the infusion room regarding the duration of the carfilzomib infusion and provided her with a sign for the pump.

## 2013-03-07 NOTE — Progress Notes (Signed)
ID: Laura Hester   DOB: 1955-07-02  MR#: [id number]  CSN#:[id number]  PCP: Sissy Hoff, MD GYN: Genia Del SU: OTHER MD: Armandina Stammer  CHIEF COMPLAINT:  Multiple Myeloma   HISTORY OF PRESENT ILLNESS: Laura Hester has a history of iron deficiency anemia secondary to menorrhagia. However, as this persisted despite near-total cessation of her menses and despite iron supplementation, she was referred to Dr. Dorena Cookey for GI evaluation. He found the patient's Hb to have decreased from 9.17 June 2011 to 8.3 07/29/2011. MCV was 95.9, ferritin 93.3, with a normal folate and negative ANA. Guaiacs were negative. Creatinine was 0.77.  t-transglutaminase IgA was normal at 2, but the total IgA was 6.9 g. Accordingly on 08/04/2011 an SPEP was obtained, showing an M-spike of 4.5 g, with a secondary M-spike of 0.4 g. Total protein was 10.0 with albumin 3.2, calcium 10.2. Bone survey 08/26/2011 showed multiple bone lesions consistent with myeloma and bone marrow biopsy 09/01/2011 confirmed the diagnosis with 75% myeloma cells in the marrow. Laura Hester' subsequent history is detailed below.  INTERVAL HISTORY: [name] returns today for followup of her multiple myeloma accompanied by her husband [name]. Her protocol nurse, Isidoro Donning, RN,  was also present during her appointment today. She is currently receiving treatment according to the [facility] S1304 protocol.  Per protocol, she is receiving carfilzomib on days 1, 2, 8, 9, 15, and 16 of each 28 day cycle. Today is day 1 cycle 2.  She tolerated her first cycle very well, and per protocol, we will increase her dose to 56 mg per meter square beginning with this cycle.   She also received zoledronic acid monthly, and is due for her next dose today.  Laura Hester is feeling great, and tells me her energy level is a 9 or 10 on a scale of 1-10. She has absolutely no pain whatsoever. She's had no signs of neuropathy at all, still a grade 0. In fact, when asked what her  greatest complaint would be today, she couldn't think of anything.  I will mention that Laura Hester was evaluated 2 weeks ago for a bruise and "lump" in the left breast. This was thought to be secondary to mild trauma. The bruise has resolved, and she tells me the lump is "almost gone".  REVIEW OF SYSTEMS: [name] has had no fevers, chills, night sweats, or unexplained weight loss.  She denies any rashes, skin changes, unexplained bruising or abnormal bleeding.  She denies any mouth ulcers or oral sensitivity and has had no jaw pain or dental problems. Her appetite is good, and she's  had no nausea, and no change in bowel or bladder habits. She denies any cough, shortness of breath, orthopnea, chest pain, or pressure. She does feel like her heart is racing at times, likely secondary to the dexamethasone. She's had no abnormal headaches or dizziness. She denies any new or unusual myalgias, arthralgias, or bony pain. She's had no peripheral swelling.   A detailed review of systems today was otherwise stable   PAST MEDICAL HISTORY: Past Medical History  Diagnosis Date  . Fibrocystic breast disease   . Allergic rhinitis   . HTN (hypertension)     mild/ observation only  . Prediabetes   . SUI (stress urinary incontinence, female)   . Overweight (BMI 25.0-29.9)   . Multiple myeloma, stage 3 08/20/11 dx  . Anemia     resolved  . Cancer   . Arthritis     PAST SURGICAL HISTORY: Past Surgical History  Procedure  Laterality Date  . Cholecystectomy  1995  . Tonsillectomy and adenoidectomy    . Carpal tunnel release      FAMILY HISTORY Family History  Problem Relation Age of Onset  . Heart disease Father   . Breast cancer Mother   . Heart disease Mother   . Diabetes Paternal Grandfather   . Hypertension Maternal Grandmother   . Hypertension Brother   The patient's parents are in their mid-70s.Her mother has a remote history of breast cancer. She has two brothers, one her twin; no sisters.  There is no other history of cancer in the family to her knowledge  GYNECOLOGIC HISTORY: Menarche age 57, first live birth age 57, GX P63. Last period was January 2013, before that May 2012.  SOCIAL HISTORY: (Updated  November 2014) She is a Software engineer and taught kindergarten until 2012. Her husband [name], graduated from [employer] and works in Regulatory affairs officer.  Daughter Laura Hester works for [employer], married in 2013. Daughter Laura Hester studies and works in [place] and comes home every other week and The patient attends the [employer]   ADVANCED DIRECTIVES: Not in place  HEALTH MAINTENANCE: (Updated November 2014) History  Substance Use Topics  . Smoking status: Never Smoker   . Smokeless tobacco: Never Used  . Alcohol Use: No     Colonoscopy: 2009/[facility]  PAP: March 2014, Dr. Seymour Bars  Bone density: never  Lipid panel: [place]/ "excellent"  Mammography: June of 2014    Allergies  Allergen Reactions  . Wellbutrin [Bupropion Hcl] Hives    Current Outpatient Prescriptions  Medication Sig Dispense Refill  . lidocaine-prilocaine (EMLA) cream Apply topically as needed.  30 g  3  . LORazepam (ATIVAN) 0.5 MG tablet Take 1 tablet (0.5 mg total) by mouth at bedtime as needed for anxiety.  30 tablet  0  . omeprazole (PRILOSEC) 20 MG capsule Take 1 capsule (20 mg total) by mouth daily.  30 capsule  12  . potassium chloride SA (K-DUR,KLOR-CON) 20 MEQ tablet Take 1 tablet (20 mEq total) by mouth daily.  90 tablet  1  . sulfamethoxazole-trimethoprim (BACTRIM DS) 800-160 MG per tablet Take 2 tablets by mouth 2 (two) times a week. Monday  AND THURSDAY      . valACYclovir (VALTREX) 500 MG tablet Take 1 tablet (500 mg total) by mouth daily.  30 tablet  12  . naproxen sodium (ANAPROX) 220 MG tablet Take 220 mg by mouth 2 (two) times daily with a meal.       No current facility-administered medications for this visit.   OBJECTIVE: Middle-aged white woman who appears  comfortable and is in no acute distress Filed Vitals:   03/07/13 1154  BP: 146/79  Pulse: 111  Temp: 99 F (37.2 C)  Resp: 19     Body mass index is 30.24 kg/(m^2).     ECOG FS: 0    Zubrod Performance Scale: 0 Filed Weights   03/07/13 1154  Weight: 165 lb 6.4 oz (75.025 kg)   Physical Exam: HEENT:  Sclerae anicteric.  Oropharynx clear. No ulcerations or evidence of candidiasis noted. Buccal mucosa is pink and moist NODES:  No cervical, supraclavicular, or axillary lymphadenopathy palpated.  BREAST EXAM:  The left breast was examined. The previously noted ecchymoses has resolved. The area of thickening has decreased significantly, now measuring less than 0.75 cm, and softer than before. LUNGS:  Clear to auscultation bilaterally.  No wheezes or rhonchi.  Good excursion bilaterally. HEART:  Regular  rate and rhythm. Soft systolic murmur, stable ABDOMEN:  Soft, nontender. No organomegaly palpated. Positive bowel sounds.  MSK:  No focal spinal tenderness to palpation. Full range of motion in both the upper and lower extremities EXTREMITIES:  No peripheral edema.   NEURO:  Nonfocal. Well oriented. Positive affect. SKIN:  Port intact in the upper chest wall, no erythema or edema and no evidence of cellulitis.    LAB RESULTS: Results for MITA, VALLO (MRN [id number]) as of 01/16/2013 08:43  Ref. Range 09/27/2012 08:42 10/11/2012 08:25 11/05/2012 08:16 12/06/2012 09:41 01/03/2013 09:01  Lambda Free Lght Chn Latest Range: 0.57-2.63 mg/dL 8.46 9.62 9.52 8.41 (H) 19.10 (H)     Lab Results  Component Value Date   WBC 6.6 03/07/2013   NEUTROABS 4.3 03/07/2013   HGB 9.9* 03/07/2013   HCT 31.8* 03/07/2013   MCV 89.1 03/07/2013   PLT 306 03/07/2013      Chemistry      Component Value Date/Time   NA 142 03/07/2013 1109   NA 143 12/01/2011 0836   K 4.0 03/07/2013 1109   K 4.0 12/01/2011 0836   CL 107 09/27/2012 0842   CL 105 12/01/2011 0836   CO2 23 03/07/2013 1109   CO2 29 12/01/2011  0836   BUN 10.7 03/07/2013 1109   BUN 12 12/01/2011 0836   CREATININE 0.7 03/07/2013 1109   CREATININE 0.52 12/01/2011 0836      Component Value Date/Time   CALCIUM 9.3 03/07/2013 1109   CALCIUM 8.9 12/01/2011 0836   ALKPHOS 53 03/07/2013 1109   ALKPHOS 110 12/01/2011 0836   AST 24 03/07/2013 1109   AST 15 12/01/2011 0836   ALT 12 03/07/2013 1109   ALT 16 12/01/2011 0836   BILITOT 0.25 03/07/2013 1109   BILITOT 0.3 12/01/2011 0836      STUDIES:  No results found.     ASSESSMENT: 57 y.o.  [place] woman presenting with anemia, found to have an IgA >6g, with an M-spike of 4.5 g (and a second M-spike of 0.4g), urine IFE showing IgA-lambda and lambda light chains, creatinine 0.77, calcium 10.0, albumin 3.1, and beta-2-microglobulin 5.51; with bone survey showing multiple myeloma lesions and bone marrow biopsy 09/01/2011 showing 75% myeloma cells. 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  (2) on prophylactic coumadin, discontinued as of 12/13/2011.  (3) zolendronic acid every 28 days, 09/19/2011 to 01/05/2012 (five doses)  (4) s/p neupogen mobilization October 2013, with a collection of 4.26 x10^6 CD34 positive cells  (5) status post melphalan at 200 mg/M2 on 02/23/2012, followed by autologous stem cell transplant the following day.  (6) on 06/07/2012 started maintenance bortezomib sq at 1.3 mg/M2 Q2w, with bactrim and acyclovir prophylaxis, the plan being to contuinue this for 2 years as per the HOVON trial  (7) zolendronic acid, given monthly resumed June 2014  (8) disease progression noted with a rise in the IgA lambda fraction September 2014  (9)  being treated according to the [facility] S1304 randomized to the high dose arm, and will be receiving carfilzomib  on days 1, 2, 8, 9, 15, and 16 of  every 28 day cycle. Cycle 1 will be given at a reduced dose of 20 mg per meter square, with subsequent cycles given at 56 mg per meter square.   PLAN:  Corine  continues to tolerate treatment extremely well and will proceed to treatment today as scheduled for day 1 cycle 2 of carfilzomib at an increased dose of 56 mg per meter square per protocol. She will also receive her next monthly dose of zoledronic acid as scheduled.   She is scheduled out through the end of December for all of her infusions, and will see Dr. Darnelle Catalan again when she returns for day 1 cycle 3 on December 22. She'll be due for her next monthly infusion of zoledronic acid today as well.  [place] and [name] both voice understanding and agreement with this plan, and they know to call with any changes that occur prior to her next appointment.  Zaraya Delauder PA-C    03/07/2013

## 2013-03-08 ENCOUNTER — Ambulatory Visit (HOSPITAL_BASED_OUTPATIENT_CLINIC_OR_DEPARTMENT_OTHER): Payer: BC Managed Care – PPO

## 2013-03-08 VITALS — BP 124/55 | HR 115 | Temp 98.1°F | Resp 20

## 2013-03-08 DIAGNOSIS — Z5112 Encounter for antineoplastic immunotherapy: Secondary | ICD-10-CM

## 2013-03-08 DIAGNOSIS — C9000 Multiple myeloma not having achieved remission: Secondary | ICD-10-CM

## 2013-03-08 MED ORDER — SODIUM CHLORIDE 0.9 % IJ SOLN
10.0000 mL | INTRAMUSCULAR | Status: DC | PRN
  Administered 2013-03-08: 10 mL
  Filled 2013-03-08: qty 10

## 2013-03-08 MED ORDER — ONDANSETRON 8 MG/NS 50 ML IVPB
INTRAVENOUS | Status: AC
  Filled 2013-03-08: qty 8

## 2013-03-08 MED ORDER — DEXAMETHASONE SODIUM PHOSPHATE 20 MG/5ML IJ SOLN
20.0000 mg | Freq: Once | INTRAMUSCULAR | Status: AC
  Administered 2013-03-08: 20 mg via INTRAVENOUS

## 2013-03-08 MED ORDER — DEXTROSE 5 % IV SOLN
56.0000 mg/m2 | Freq: Once | INTRAVENOUS | Status: AC
  Administered 2013-03-08: 102 mg via INTRAVENOUS
  Filled 2013-03-08: qty 51

## 2013-03-08 MED ORDER — DEXAMETHASONE SODIUM PHOSPHATE 20 MG/5ML IJ SOLN
INTRAMUSCULAR | Status: AC
  Filled 2013-03-08: qty 5

## 2013-03-08 MED ORDER — ONDANSETRON 8 MG/50ML IVPB (CHCC)
8.0000 mg | Freq: Once | INTRAVENOUS | Status: AC
  Administered 2013-03-08: 8 mg via INTRAVENOUS

## 2013-03-08 MED ORDER — SODIUM CHLORIDE 0.9 % IV SOLN
Freq: Once | INTRAVENOUS | Status: AC
  Administered 2013-03-08: 14:00:00 via INTRAVENOUS

## 2013-03-08 MED ORDER — HEPARIN SOD (PORK) LOCK FLUSH 100 UNIT/ML IV SOLN
500.0000 [IU] | Freq: Once | INTRAVENOUS | Status: AC | PRN
  Administered 2013-03-08: 500 [IU]
  Filled 2013-03-08: qty 5

## 2013-03-08 NOTE — Patient Instructions (Signed)
[facility] Discharge Instructions for Patients Receiving Chemotherapy  Today you received the following chemotherapy agents Kyprolis To help prevent nausea and vomiting after your treatment, we encourage you to take your nausea medication as prescribed.  If you develop nausea and vomiting that is not controlled by your nausea medication, call the clinic.   BELOW ARE SYMPTOMS THAT SHOULD BE REPORTED IMMEDIATELY:  *FEVER GREATER THAN 100.5 F  *CHILLS WITH OR WITHOUT FEVER  NAUSEA AND VOMITING THAT IS NOT CONTROLLED WITH YOUR NAUSEA MEDICATION  *UNUSUAL SHORTNESS OF BREATH  *UNUSUAL BRUISING OR BLEEDING  TENDERNESS IN MOUTH AND THROAT WITH OR WITHOUT PRESENCE OF ULCERS  *URINARY PROBLEMS  *BOWEL PROBLEMS  UNUSUAL RASH Items with * indicate a potential emergency and should be followed up as soon as possible.  Feel free to call the clinic you have any questions or concerns. The clinic phone number is [phone].    

## 2013-03-09 LAB — UPEP/TP, 24-HR URINE
Alpha-2-Globulin, U: 18.9 %
Beta Globulin, U: 11.7 %
Collection Interval: 24 hours
Total Protein, Urine/Day: 375 mg/d — ABNORMAL HIGH (ref 50–100)
Total Protein, Urine: 15 mg/dL
Total Volume, Urine: 2500 mL

## 2013-03-09 LAB — UIFE/LIGHT CHAINS/TP QN, 24-HR UR
Beta, Urine: DETECTED — AB
Free Kappa Lt Chains,Ur: 0.29 mg/dL (ref 0.14–2.42)
Free Lambda Lt Chains,Ur: 0.86 mg/dL — ABNORMAL HIGH (ref 0.02–0.67)
Free Lt Chn Excr Rate: 7.25 mg/d
Gamma Globulin, Urine: DETECTED — AB
Total Protein, Urine-Ur/day: 210 mg/d — ABNORMAL HIGH (ref 10–140)
Volume, Urine: 2500 mL

## 2013-03-11 LAB — SPEP & IFE WITH QIG
Alpha-2-Globulin: 10.2 % (ref 7.1–11.8)
Beta Globulin: 38.2 % — ABNORMAL HIGH (ref 4.7–7.2)
Gamma Globulin: 3.2 % — ABNORMAL LOW (ref 11.1–18.8)
IgG (Immunoglobin G), Serum: 374 mg/dL — ABNORMAL LOW (ref 690–1700)
M-Spike, %: 1.17 g/dL

## 2013-03-11 LAB — KAPPA/LAMBDA LIGHT CHAINS: Lambda Free Lght Chn: 31.8 mg/dL — ABNORMAL HIGH (ref 0.57–2.63)

## 2013-03-11 LAB — IGE: IgE (Immunoglobulin E), Serum: 10.5 IU/mL (ref 0.0–180.0)

## 2013-03-14 ENCOUNTER — Other Ambulatory Visit: Payer: BC Managed Care – PPO | Admitting: Lab

## 2013-03-14 ENCOUNTER — Ambulatory Visit (HOSPITAL_BASED_OUTPATIENT_CLINIC_OR_DEPARTMENT_OTHER): Payer: BC Managed Care – PPO

## 2013-03-14 VITALS — BP 147/81 | HR 93 | Temp 98.2°F | Resp 20

## 2013-03-14 DIAGNOSIS — C9000 Multiple myeloma not having achieved remission: Secondary | ICD-10-CM

## 2013-03-14 DIAGNOSIS — Z5112 Encounter for antineoplastic immunotherapy: Secondary | ICD-10-CM

## 2013-03-14 MED ORDER — SODIUM CHLORIDE 0.9 % IV SOLN
Freq: Once | INTRAVENOUS | Status: AC
  Administered 2013-03-14: 13:00:00 via INTRAVENOUS

## 2013-03-14 MED ORDER — DEXAMETHASONE SODIUM PHOSPHATE 20 MG/5ML IJ SOLN
INTRAMUSCULAR | Status: AC
  Filled 2013-03-14: qty 5

## 2013-03-14 MED ORDER — SODIUM CHLORIDE 0.9 % IJ SOLN
10.0000 mL | INTRAMUSCULAR | Status: DC | PRN
  Administered 2013-03-14: 10 mL
  Filled 2013-03-14: qty 10

## 2013-03-14 MED ORDER — INV-CARFILZOMIB CHEMO INJECTION 60 MG SWOG S1304
56.0000 mg/m2 | Freq: Once | INTRAVENOUS | Status: AC
  Administered 2013-03-14: 102 mg via INTRAVENOUS
  Filled 2013-03-14: qty 51

## 2013-03-14 MED ORDER — DEXAMETHASONE SODIUM PHOSPHATE 20 MG/5ML IJ SOLN
20.0000 mg | Freq: Once | INTRAMUSCULAR | Status: AC
  Administered 2013-03-14: 20 mg via INTRAVENOUS

## 2013-03-14 MED ORDER — ONDANSETRON 8 MG/50ML IVPB (CHCC)
8.0000 mg | Freq: Once | INTRAVENOUS | Status: AC
  Administered 2013-03-14: 8 mg via INTRAVENOUS

## 2013-03-14 MED ORDER — ONDANSETRON 8 MG/NS 50 ML IVPB
INTRAVENOUS | Status: AC
  Filled 2013-03-14: qty 8

## 2013-03-14 MED ORDER — HEPARIN SOD (PORK) LOCK FLUSH 100 UNIT/ML IV SOLN
500.0000 [IU] | Freq: Once | INTRAVENOUS | Status: AC | PRN
  Administered 2013-03-14: 500 [IU]
  Filled 2013-03-14: qty 5

## 2013-03-14 NOTE — Progress Notes (Signed)
Carfilzomib 102 mg infusion completed at 1410 without adverse event.

## 2013-03-14 NOTE — Patient Instructions (Signed)
[facility] Discharge Instructions for Patients Receiving Chemotherapy  Today you received the following chemotherapy agents: Kyprolis  To help prevent nausea and vomiting after your treatment, we encourage you to take your nausea medication as needed: Ativan    If you develop nausea and vomiting that is not controlled by your nausea medication, call the clinic.   BELOW ARE SYMPTOMS THAT SHOULD BE REPORTED IMMEDIATELY:  *FEVER GREATER THAN 100.5 F  *CHILLS WITH OR WITHOUT FEVER  NAUSEA AND VOMITING THAT IS NOT CONTROLLED WITH YOUR NAUSEA MEDICATION  *UNUSUAL SHORTNESS OF BREATH  *UNUSUAL BRUISING OR BLEEDING  TENDERNESS IN MOUTH AND THROAT WITH OR WITHOUT PRESENCE OF ULCERS  *URINARY PROBLEMS  *BOWEL PROBLEMS  UNUSUAL RASH Items with * indicate a potential emergency and should be followed up as soon as possible.  Feel free to call the clinic you have any questions or concerns. The clinic phone number is [phone].  It has been a pleasure to serve you today !

## 2013-03-15 ENCOUNTER — Ambulatory Visit (HOSPITAL_BASED_OUTPATIENT_CLINIC_OR_DEPARTMENT_OTHER): Payer: BC Managed Care – PPO

## 2013-03-15 DIAGNOSIS — C9000 Multiple myeloma not having achieved remission: Secondary | ICD-10-CM

## 2013-03-15 DIAGNOSIS — Z5112 Encounter for antineoplastic immunotherapy: Secondary | ICD-10-CM

## 2013-03-15 MED ORDER — DEXAMETHASONE SODIUM PHOSPHATE 20 MG/5ML IJ SOLN
INTRAMUSCULAR | Status: AC
  Filled 2013-03-15: qty 5

## 2013-03-15 MED ORDER — HEPARIN SOD (PORK) LOCK FLUSH 100 UNIT/ML IV SOLN
500.0000 [IU] | Freq: Once | INTRAVENOUS | Status: AC | PRN
  Administered 2013-03-15: 500 [IU]
  Filled 2013-03-15: qty 5

## 2013-03-15 MED ORDER — ONDANSETRON 8 MG/NS 50 ML IVPB
INTRAVENOUS | Status: AC
  Filled 2013-03-15: qty 8

## 2013-03-15 MED ORDER — SODIUM CHLORIDE 0.9 % IV SOLN
Freq: Once | INTRAVENOUS | Status: AC
  Administered 2013-03-15: 15:00:00 via INTRAVENOUS

## 2013-03-15 MED ORDER — ONDANSETRON 8 MG/50ML IVPB (CHCC)
8.0000 mg | Freq: Once | INTRAVENOUS | Status: AC
  Administered 2013-03-15: 8 mg via INTRAVENOUS

## 2013-03-15 MED ORDER — SODIUM CHLORIDE 0.9 % IJ SOLN
10.0000 mL | INTRAMUSCULAR | Status: DC | PRN
  Administered 2013-03-15: 10 mL
  Filled 2013-03-15: qty 10

## 2013-03-15 MED ORDER — INV-CARFILZOMIB CHEMO INJECTION 60 MG SWOG S1304
56.0000 mg/m2 | Freq: Once | INTRAVENOUS | Status: AC
  Administered 2013-03-15: 102 mg via INTRAVENOUS
  Filled 2013-03-15: qty 51

## 2013-03-15 MED ORDER — DEXAMETHASONE SODIUM PHOSPHATE 20 MG/5ML IJ SOLN
20.0000 mg | Freq: Once | INTRAMUSCULAR | Status: AC
  Administered 2013-03-15: 20 mg via INTRAVENOUS

## 2013-03-15 NOTE — Patient Instructions (Signed)
Carfilzomib injection What is this medicine? CARFILZOMIB (kar FILZ oh mib) is a chemotherapy drug that works by slowing or stopping cancer cell growth. This medicine is used to treat multiple myeloma. This medicine may be used for other purposes; ask your health care provider or pharmacist if you have questions. COMMON BRAND NAME(S): KYPROLIS What should I tell my health care provider before I take this medicine? They need to know if you have any of these conditions: -heart disease -irregular heartbeat -liver disease -lung or breathing disease -an unusual or allergic reaction to carfilzomib, or other medicines, foods, dyes, or preservatives -pregnant or trying to get pregnant -breast-feeding How should I use this medicine? This medicine is for injection or infusion into a vein. It is given by a health care professional in a hospital or clinic setting. Talk to your pediatrician regarding the use of this medicine in children. Special care may be needed. Overdosage: If you think you've taken too much of this medicine contact a poison control center or emergency room at once. Overdosage: If you think you have taken too much of this medicine contact a poison control center or emergency room at once. NOTE: This medicine is only for you. Do not share this medicine with others. What if I miss a dose? It is important not to miss your dose. Call your doctor or health care professional if you are unable to keep an appointment. What may interact with this medicine? Interactions are not expected. Give your health care provider a list of all the medicines, herbs, non-prescription drugs, or dietary supplements you use. Also tell them if you smoke, drink alcohol, or use illegal drugs. Some items may interact with your medicine. This list may not describe all possible interactions. Give your health care provider a list of all the medicines, herbs, non-prescription drugs, or dietary supplements you use. Also  tell them if you smoke, drink alcohol, or use illegal drugs. Some items may interact with your medicine. What should I watch for while using this medicine? Your condition will be monitored carefully while you are receiving this medicine. Report any side effects. Continue your course of treatment even though you feel ill unless your doctor tells you to stop. Call your doctor or health care professional for advice if you get a fever, chills or sore throat, or other symptoms of a cold or flu. Do not treat yourself. Try to avoid being around people who are sick. Do not become pregnant while taking this medicine. Women should inform their doctor if they wish to become pregnant or think they might be pregnant. There is a potential for serious side effects to an unborn child. Talk to your health care professional or pharmacist for more information. Do not breast-feed an infant while taking this medicine. Check with your doctor or health care professional if you get an attack of severe diarrhea, nausea and vomiting, or if you sweat a lot. The loss of too much body fluid can make it dangerous for you to take this medicine. You may get dizzy. Do not drive, use machinery, or do anything that needs mental alertness until you know how this medicine affects you. Do not stand or sit up quickly, especially if you are an older patient. This reduces the risk of dizzy or fainting spells. What side effects may I notice from receiving this medicine? Side effects that you should report to your doctor or health care professional as soon as possible: -allergic reactions like skin rash, itching or hives,   swelling of the face, lips, or tongue -breathing problems -chest pain or palpitationschest tightness -cough -dark urine -dizziness -feeling faint or lightheaded -fever or chills -general ill feeling or flu-like symptoms -light-colored stools -palpitations -right upper belly pain -swelling of the legs or ankles -unusual  bleeding or bruising -unusually weak or tired -yellowing of the eyes or skin  Side effects that usually do not require medical attention (Report these to your doctor or health care professional if they continue or are bothersome.): -diarrhea -headache -nausea, vomiting -tiredness This list may not describe all possible side effects. Call your doctor for medical advice about side effects. You may report side effects to FDA at 1-800-FDA-1088. Where should I keep my medicine? This drug is given in a hospital or clinic and will not be stored at home. NOTE: This sheet is a summary. It may not cover all possible information. If you have questions about this medicine, talk to your doctor, pharmacist, or health care provider.  2014, Elsevier/Gold Standard. (2011-09-19 17:02:29)  

## 2013-03-19 ENCOUNTER — Other Ambulatory Visit: Payer: Self-pay | Admitting: Oncology

## 2013-03-21 ENCOUNTER — Ambulatory Visit (HOSPITAL_BASED_OUTPATIENT_CLINIC_OR_DEPARTMENT_OTHER): Payer: BC Managed Care – PPO

## 2013-03-21 VITALS — BP 149/77 | HR 95 | Temp 98.9°F | Resp 18

## 2013-03-21 DIAGNOSIS — C9000 Multiple myeloma not having achieved remission: Secondary | ICD-10-CM

## 2013-03-21 DIAGNOSIS — Z5112 Encounter for antineoplastic immunotherapy: Secondary | ICD-10-CM

## 2013-03-21 MED ORDER — ONDANSETRON 8 MG/NS 50 ML IVPB
INTRAVENOUS | Status: AC
  Filled 2013-03-21: qty 8

## 2013-03-21 MED ORDER — DEXAMETHASONE SODIUM PHOSPHATE 20 MG/5ML IJ SOLN
20.0000 mg | Freq: Once | INTRAMUSCULAR | Status: AC
  Administered 2013-03-21: 20 mg via INTRAVENOUS

## 2013-03-21 MED ORDER — SODIUM CHLORIDE 0.9 % IV SOLN
Freq: Once | INTRAVENOUS | Status: AC
  Administered 2013-03-21: 13:00:00 via INTRAVENOUS

## 2013-03-21 MED ORDER — DEXAMETHASONE SODIUM PHOSPHATE 20 MG/5ML IJ SOLN
INTRAMUSCULAR | Status: AC
  Filled 2013-03-21: qty 5

## 2013-03-21 MED ORDER — ONDANSETRON 8 MG/50ML IVPB (CHCC)
8.0000 mg | Freq: Once | INTRAVENOUS | Status: AC
  Administered 2013-03-21: 8 mg via INTRAVENOUS

## 2013-03-21 MED ORDER — INV-CARFILZOMIB CHEMO INJECTION 60 MG SWOG S1304
56.0000 mg/m2 | Freq: Once | INTRAVENOUS | Status: AC
  Administered 2013-03-21: 102 mg via INTRAVENOUS
  Filled 2013-03-21: qty 51

## 2013-03-21 MED ORDER — SODIUM CHLORIDE 0.9 % IJ SOLN
10.0000 mL | INTRAMUSCULAR | Status: DC | PRN
  Administered 2013-03-21: 10 mL
  Filled 2013-03-21: qty 10

## 2013-03-21 MED ORDER — HEPARIN SOD (PORK) LOCK FLUSH 100 UNIT/ML IV SOLN
500.0000 [IU] | Freq: Once | INTRAVENOUS | Status: AC | PRN
  Administered 2013-03-21: 500 [IU]
  Filled 2013-03-21: qty 5

## 2013-03-21 NOTE — Patient Instructions (Signed)
[facility] Discharge Instructions for Patients Receiving Chemotherapy  Today you received the following chemotherapy agents Kyprolis To help prevent nausea and vomiting after your treatment, we encourage you to take your nausea medication as prescribed.  If you develop nausea and vomiting that is not controlled by your nausea medication, call the clinic.   BELOW ARE SYMPTOMS THAT SHOULD BE REPORTED IMMEDIATELY:  *FEVER GREATER THAN 100.5 F  *CHILLS WITH OR WITHOUT FEVER  NAUSEA AND VOMITING THAT IS NOT CONTROLLED WITH YOUR NAUSEA MEDICATION  *UNUSUAL SHORTNESS OF BREATH  *UNUSUAL BRUISING OR BLEEDING  TENDERNESS IN MOUTH AND THROAT WITH OR WITHOUT PRESENCE OF ULCERS  *URINARY PROBLEMS  *BOWEL PROBLEMS  UNUSUAL RASH Items with * indicate a potential emergency and should be followed up as soon as possible.  Feel free to call the clinic you have any questions or concerns. The clinic phone number is [phone].    

## 2013-03-22 ENCOUNTER — Encounter: Payer: Self-pay | Admitting: *Deleted

## 2013-03-22 ENCOUNTER — Ambulatory Visit (HOSPITAL_BASED_OUTPATIENT_CLINIC_OR_DEPARTMENT_OTHER): Payer: BC Managed Care – PPO

## 2013-03-22 VITALS — BP 129/72 | HR 91 | Temp 98.5°F

## 2013-03-22 DIAGNOSIS — Z5112 Encounter for antineoplastic immunotherapy: Secondary | ICD-10-CM

## 2013-03-22 DIAGNOSIS — C9000 Multiple myeloma not having achieved remission: Secondary | ICD-10-CM

## 2013-03-22 MED ORDER — DEXAMETHASONE SODIUM PHOSPHATE 20 MG/5ML IJ SOLN
20.0000 mg | Freq: Once | INTRAMUSCULAR | Status: AC
  Administered 2013-03-22: 20 mg via INTRAVENOUS

## 2013-03-22 MED ORDER — SODIUM CHLORIDE 0.9 % IV SOLN
Freq: Once | INTRAVENOUS | Status: AC
  Administered 2013-03-22: 14:00:00 via INTRAVENOUS

## 2013-03-22 MED ORDER — SODIUM CHLORIDE 0.9 % IJ SOLN
10.0000 mL | INTRAMUSCULAR | Status: DC | PRN
  Filled 2013-03-22: qty 10

## 2013-03-22 MED ORDER — ONDANSETRON 8 MG/50ML IVPB (CHCC)
8.0000 mg | Freq: Once | INTRAVENOUS | Status: AC
  Administered 2013-03-22: 8 mg via INTRAVENOUS

## 2013-03-22 MED ORDER — INV-CARFILZOMIB CHEMO INJECTION 60 MG SWOG S1304
56.0000 mg/m2 | Freq: Once | INTRAVENOUS | Status: AC
  Administered 2013-03-22: 102 mg via INTRAVENOUS
  Filled 2013-03-22: qty 51

## 2013-03-22 MED ORDER — HEPARIN SOD (PORK) LOCK FLUSH 100 UNIT/ML IV SOLN
500.0000 [IU] | Freq: Once | INTRAVENOUS | Status: DC | PRN
  Filled 2013-03-22: qty 5

## 2013-03-22 MED ORDER — DEXAMETHASONE SODIUM PHOSPHATE 20 MG/5ML IJ SOLN
INTRAMUSCULAR | Status: AC
  Filled 2013-03-22: qty 5

## 2013-03-22 MED ORDER — ONDANSETRON 8 MG/NS 50 ML IVPB
INTRAVENOUS | Status: AC
  Filled 2013-03-22: qty 8

## 2013-03-22 NOTE — Patient Instructions (Signed)
[facility] Discharge Instructions for Patients Receiving Chemotherapy  Today you received the following chemotherapy agents: Kyprolis  To help prevent nausea and vomiting after your treatment, we encourage you to take your nausea medication: as directed.   If you develop nausea and vomiting that is not controlled by your nausea medication, call the clinic.   BELOW ARE SYMPTOMS THAT SHOULD BE REPORTED IMMEDIATELY:  *FEVER GREATER THAN 100.5 F  *CHILLS WITH OR WITHOUT FEVER  NAUSEA AND VOMITING THAT IS NOT CONTROLLED WITH YOUR NAUSEA MEDICATION  *UNUSUAL SHORTNESS OF BREATH  *UNUSUAL BRUISING OR BLEEDING  TENDERNESS IN MOUTH AND THROAT WITH OR WITHOUT PRESENCE OF ULCERS  *URINARY PROBLEMS  *BOWEL PROBLEMS  UNUSUAL RASH Items with * indicate a potential emergency and should be followed up as soon as possible.  Feel free to call the clinic you have any questions or concerns. The clinic phone number is [phone].    

## 2013-03-22 NOTE — Progress Notes (Signed)
Chaplain made follow-up visit. Pt was crocheting a stocking and in very good spirits. Pt shared about the clinical trial she is taking and how she decided on that course of action. Pt stated that she has "tons of support" and received "over 300 get-well cards" the last time she was ill. Pt said that she has not experienced many side effects and that she is very blessed in this way. She described being diagnosed with multiple myeloma and how she had to "put on her big-girl panties" and deal with it. Pt discussed how it has helped her to maintain a very positive attitude throughout her treatment. 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 Chaplain affirmed pt for setting good boundaries. Chaplain asked if pt had ever attended the multiple myeloma support group and she said that her Dr advised her not go since she already had such a good support network and might hear "depressing stories," that would get her down.  Chaplain practiced active listening and empathic presence.

## 2013-03-22 NOTE — Progress Notes (Signed)
Chaplain made follow-up visit. Pt was crocheting a stocking and in very good spirits. Pt shared about the clinical trial she is taking and how she decided on that course of action. Pt stated that she has "tons of support" and received "over 300 get-well cards" the last time she was ill. Pt said that she has not experienced many side effects and that she is very blessed in this way. She described being diagnosed with multiple myeloma and how she had to "put on her big-girl panties" and deal with it. Pt discussed how it has helped her to maintain a very positive attitude throughout her treatment. 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 Chaplain affirmed pt for setting good boundaries. Chaplain asked if pt had ever attended the multiple myeloma support group and she said that her Dr advised her not go since she already had such a good support network and might hear "depressing stories," that would get her down.  Chaplain practiced active listening and empathic presence.  

## 2013-03-28 ENCOUNTER — Other Ambulatory Visit: Payer: BC Managed Care – PPO | Admitting: Lab

## 2013-04-01 ENCOUNTER — Ambulatory Visit (HOSPITAL_BASED_OUTPATIENT_CLINIC_OR_DEPARTMENT_OTHER): Payer: BC Managed Care – PPO

## 2013-04-01 ENCOUNTER — Other Ambulatory Visit: Payer: BC Managed Care – PPO

## 2013-04-01 DIAGNOSIS — C9000 Multiple myeloma not having achieved remission: Secondary | ICD-10-CM

## 2013-04-01 DIAGNOSIS — Z95828 Presence of other vascular implants and grafts: Secondary | ICD-10-CM

## 2013-04-01 MED ORDER — HEPARIN SOD (PORK) LOCK FLUSH 100 UNIT/ML IV SOLN
500.0000 [IU] | Freq: Once | INTRAVENOUS | Status: AC
  Administered 2013-04-01: 500 [IU] via INTRAVENOUS
  Filled 2013-04-01: qty 5

## 2013-04-01 MED ORDER — SODIUM CHLORIDE 0.9 % IJ SOLN
10.0000 mL | Freq: Once | INTRAMUSCULAR | Status: AC
  Administered 2013-04-01: 10 mL via INTRAVENOUS
  Filled 2013-04-01: qty 10

## 2013-04-01 NOTE — Patient Instructions (Signed)

## 2013-04-04 ENCOUNTER — Ambulatory Visit (HOSPITAL_BASED_OUTPATIENT_CLINIC_OR_DEPARTMENT_OTHER): Payer: BC Managed Care – PPO

## 2013-04-04 ENCOUNTER — Ambulatory Visit: Payer: BC Managed Care – PPO

## 2013-04-04 ENCOUNTER — Telehealth: Payer: Self-pay | Admitting: *Deleted

## 2013-04-04 ENCOUNTER — Ambulatory Visit (HOSPITAL_BASED_OUTPATIENT_CLINIC_OR_DEPARTMENT_OTHER): Payer: BC Managed Care – PPO | Admitting: Oncology

## 2013-04-04 ENCOUNTER — Telehealth: Payer: Self-pay | Admitting: Oncology

## 2013-04-04 ENCOUNTER — Encounter: Payer: Self-pay | Admitting: *Deleted

## 2013-04-04 ENCOUNTER — Other Ambulatory Visit (HOSPITAL_BASED_OUTPATIENT_CLINIC_OR_DEPARTMENT_OTHER): Payer: BC Managed Care – PPO

## 2013-04-04 VITALS — BP 134/58 | HR 91 | Temp 98.3°F | Resp 20

## 2013-04-04 VITALS — Ht 62.0 in | Wt 166.3 lb

## 2013-04-04 DIAGNOSIS — C9000 Multiple myeloma not having achieved remission: Secondary | ICD-10-CM

## 2013-04-04 DIAGNOSIS — D630 Anemia in neoplastic disease: Secondary | ICD-10-CM

## 2013-04-04 DIAGNOSIS — E876 Hypokalemia: Secondary | ICD-10-CM

## 2013-04-04 DIAGNOSIS — Z5112 Encounter for antineoplastic immunotherapy: Secondary | ICD-10-CM

## 2013-04-04 LAB — CBC WITH DIFFERENTIAL/PLATELET
Basophils Absolute: 0 10*3/uL (ref 0.0–0.1)
EOS%: 2.4 % (ref 0.0–7.0)
Eosinophils Absolute: 0.2 10*3/uL (ref 0.0–0.5)
HGB: 10.9 g/dL — ABNORMAL LOW (ref 11.6–15.9)
LYMPH%: 14.7 % (ref 14.0–49.7)
MCH: 28.6 pg (ref 25.1–34.0)
MCV: 88 fL (ref 79.5–101.0)
MONO#: 0.8 10*3/uL (ref 0.1–0.9)
MONO%: 12 % (ref 0.0–14.0)
NEUT#: 4.6 10*3/uL (ref 1.5–6.5)
Platelets: 337 10*3/uL (ref 145–400)
RBC: 3.82 10*6/uL (ref 3.70–5.45)

## 2013-04-04 LAB — COMPREHENSIVE METABOLIC PANEL (CC13)
ALT: 13 U/L (ref 0–55)
Albumin: 3 g/dL — ABNORMAL LOW (ref 3.5–5.0)
Anion Gap: 8 mEq/L (ref 3–11)
BUN: 8.8 mg/dL (ref 7.0–26.0)
Calcium: 8.9 mg/dL (ref 8.4–10.4)
Creatinine: 0.6 mg/dL (ref 0.6–1.1)
Glucose: 119 mg/dl (ref 70–140)
Sodium: 143 mEq/L (ref 136–145)

## 2013-04-04 LAB — PHOSPHORUS: Phosphorus: 4 mg/dL (ref 2.3–4.6)

## 2013-04-04 LAB — MAGNESIUM (CC13): Magnesium: 2.1 mg/dl (ref 1.5–2.5)

## 2013-04-04 LAB — LACTATE DEHYDROGENASE (CC13): LDH: 181 U/L (ref 125–245)

## 2013-04-04 MED ORDER — DEXAMETHASONE SODIUM PHOSPHATE 20 MG/5ML IJ SOLN
20.0000 mg | Freq: Once | INTRAMUSCULAR | Status: AC
  Administered 2013-04-04: 20 mg via INTRAVENOUS

## 2013-04-04 MED ORDER — OMEPRAZOLE 20 MG PO CPDR: 20.0000 mg | DELAYED_RELEASE_CAPSULE | Freq: Every day | ORAL | Status: DC

## 2013-04-04 MED ORDER — LORAZEPAM 0.5 MG PO TABS: 0.5000 mg | ORAL_TABLET | Freq: Every evening | ORAL | Status: DC | PRN

## 2013-04-04 MED ORDER — POTASSIUM CHLORIDE CRYS ER 20 MEQ PO TBCR: 20.0000 meq | EXTENDED_RELEASE_TABLET | Freq: Every day | ORAL | Status: DC

## 2013-04-04 MED ORDER — SODIUM CHLORIDE 0.9 % IV SOLN
Freq: Once | INTRAVENOUS | Status: AC
  Administered 2013-04-04: 11:00:00 via INTRAVENOUS

## 2013-04-04 MED ORDER — ONDANSETRON 8 MG/NS 50 ML IVPB
INTRAVENOUS | Status: AC
  Filled 2013-04-04: qty 8

## 2013-04-04 MED ORDER — VALACYCLOVIR HCL 500 MG PO TABS: 500.0000 mg | ORAL_TABLET | Freq: Every day | ORAL | Status: DC

## 2013-04-04 MED ORDER — HEPARIN SOD (PORK) LOCK FLUSH 100 UNIT/ML IV SOLN
500.0000 [IU] | Freq: Once | INTRAVENOUS | Status: AC | PRN
  Administered 2013-04-04: 500 [IU]
  Filled 2013-04-04: qty 5

## 2013-04-04 MED ORDER — DEXAMETHASONE SODIUM PHOSPHATE 20 MG/5ML IJ SOLN
INTRAMUSCULAR | Status: AC
  Filled 2013-04-04: qty 5

## 2013-04-04 MED ORDER — SODIUM CHLORIDE 0.9 % IJ SOLN
10.0000 mL | INTRAMUSCULAR | Status: DC | PRN
  Administered 2013-04-04: 10 mL
  Filled 2013-04-04: qty 10

## 2013-04-04 MED ORDER — INV-CARFILZOMIB CHEMO INJECTION 60 MG SWOG S1304
56.0000 mg/m2 | Freq: Once | INTRAVENOUS | Status: AC
  Administered 2013-04-04: 102 mg via INTRAVENOUS
  Filled 2013-04-04: qty 51

## 2013-04-04 MED ORDER — NAPROXEN SODIUM 220 MG PO TABS: 220.0000 mg | ORAL_TABLET | Freq: Two times a day (BID) | ORAL | Status: DC

## 2013-04-04 MED ORDER — LIDOCAINE-PRILOCAINE 2.5-2.5 % EX CREA: TOPICAL_CREAM | CUTANEOUS | Status: AC | PRN

## 2013-04-04 MED ORDER — SULFAMETHOXAZOLE-TMP DS 800-160 MG PO TABS: 2.0000 | ORAL_TABLET | ORAL | Status: DC

## 2013-04-04 MED ORDER — SODIUM CHLORIDE 0.9 % IJ SOLN
10.0000 mL | INTRAMUSCULAR | Status: DC | PRN
  Administered 2013-04-04: 10 mL via INTRAVENOUS
  Filled 2013-04-04: qty 10

## 2013-04-04 MED ORDER — ONDANSETRON 8 MG/50ML IVPB (CHCC)
8.0000 mg | Freq: Once | INTRAVENOUS | Status: AC
  Administered 2013-04-04: 8 mg via INTRAVENOUS

## 2013-04-04 MED ORDER — ZOLEDRONIC ACID 4 MG/100ML IV SOLN
4.0000 mg | Freq: Once | INTRAVENOUS | Status: AC
  Administered 2013-04-04: 4 mg via INTRAVENOUS
  Filled 2013-04-04: qty 100

## 2013-04-04 MED ORDER — HEPARIN SOD (PORK) LOCK FLUSH 100 UNIT/ML IV SOLN
500.0000 [IU] | Freq: Once | INTRAVENOUS | Status: AC
  Administered 2013-04-04: 500 [IU] via INTRAVENOUS
  Filled 2013-04-04: qty 5

## 2013-04-04 NOTE — Patient Instructions (Signed)
Today you had your port flushed. Continue to have port flushed every 6-8 weeks

## 2013-04-04 NOTE — Progress Notes (Signed)
ID: Laura Hester   DOB: 26-Aug-1955  MR#: [id number]  JYN#:[id number]  PCP: Laura Hoff, MD GYN: Laura Hester: OTHER MD: Armandina Stammer  CHIEF COMPLAINT:  Multiple Myeloma   HISTORY OF PRESENT ILLNESS: Laura Hester has a history of iron deficiency anemia secondary to menorrhagia. However, as this persisted despite near-total cessation of her menses and despite iron supplementation, she was referred to Dr. Dorena Cookey for GI evaluation. He found the patient's Hb to have decreased from 9.17 June 2011 to 8.3 07/29/2011. MCV was 95.9, ferritin 93.3, with a normal folate and negative ANA. Guaiacs were negative. Creatinine was 0.77.  t-transglutaminase IgA was normal at 2, but the total IgA was 6.9 g. Accordingly on 08/04/2011 an SPEP was obtained, showing an M-spike of 4.5 g, with a secondary M-spike of 0.4 g. Total protein was 10.0 with albumin 3.2, calcium 10.2. Bone survey 08/26/2011 showed multiple bone lesions consistent with myeloma and bone marrow biopsy 09/01/2011 confirmed the diagnosis with 75% myeloma cells in the marrow. Lashika' subsequent history is detailed below.  INTERVAL HISTORY: Laura Hester returns today for followup of her multiple myeloma accompanied by her husband Laura Hester. Today is day 1 cycle 3 of her high-dose carfilzomib, which she is receiving according to SWOG 1304.  REVIEW OF SYSTEMS: Laura Hester is tolerating treatment remarkably well. She tends to get constipated, but takes care of that with prunes. She's not having any hard bowel movements. Just a little bit of insomnia on days 1 and 2, which of course her of the treatment days. She takes lorazepam for that. Otherwise she denies any symptoms related to treatment. In particular her a tachycardia is much improved as is her sense of fatigue. This is likely secondary to the improvement in her hemoglobin. She is walking around the house but not otherwise exercising on a regular basis. A detailed review of systems today was  otherwise noncontributory   PAST MEDICAL HISTORY: Past Medical History  Diagnosis Date  . Fibrocystic breast disease   . Allergic rhinitis   . HTN (hypertension)     mild/ observation only  . Prediabetes   . SUI (stress urinary incontinence, female)   . Overweight (BMI 25.0-29.9)   . Multiple myeloma, stage 3 08/20/11 dx  . Anemia     resolved  . Cancer   . Arthritis     PAST SURGICAL HISTORY: Past Surgical History  Procedure Laterality Date  . Cholecystectomy  1995  . Tonsillectomy and adenoidectomy    . Carpal tunnel release      FAMILY HISTORY Family History  Problem Relation Age of Onset  . Heart disease Father   . Breast cancer Mother   . Heart disease Mother   . Diabetes Paternal Grandfather   . Hypertension Maternal Grandmother   . Hypertension Brother   The patient's parents are in their mid-70s.Her mother has a remote history of breast cancer. She has two brothers, one her twin; no sisters. There is no other history of cancer in the family to her knowledge  GYNECOLOGIC HISTORY: Menarche age 14, first live birth age 69, GX P51. Last period was January 2013, before that May 2012.  SOCIAL HISTORY: (Updated  November 2014) She is a Software engineer and taught kindergarten until 2012. Her husband Laura Hester, graduated from [employer] and works in Regulatory affairs officer.  Daughter Laura Hester works for [employer], married in 2013. Daughter Laura Hester studies and works in [place] and comes home every other week and The patient attends the [employer]  ADVANCED DIRECTIVES: Not in place  HEALTH MAINTENANCE: (Updated November 2014) History  Substance Use Topics  . Smoking status: Never Smoker   . Smokeless tobacco: Never Used  . Alcohol Use: No     Colonoscopy: 2009/[facility]  PAP: March 2014, Dr. Seymour Bars  Bone density: never  Lipid panel: [facility]/ "excellent"  Mammography: June of 2014    Allergies  Allergen Reactions  . Wellbutrin [Bupropion Hcl] Hives     Current Outpatient Prescriptions  Medication Sig Dispense Refill  . lidocaine-prilocaine (EMLA) cream Apply topically as needed.  30 g  3  . LORazepam (ATIVAN) 0.5 MG tablet Take 1 tablet (0.5 mg total) by mouth at bedtime as needed for anxiety.  30 tablet  0  . naproxen sodium (ANAPROX) 220 MG tablet Take 1 tablet (220 mg total) by mouth 2 (two) times daily with a meal.  60 tablet  6  . omeprazole (PRILOSEC) 20 MG capsule Take 1 capsule (20 mg total) by mouth daily.  30 capsule  12  . potassium chloride SA (K-DUR,KLOR-CON) 20 MEQ tablet Take 1 tablet (20 mEq total) by mouth daily.  90 tablet  1  . sulfamethoxazole-trimethoprim (BACTRIM DS) 800-160 MG per tablet Take 2 tablets by mouth 2 (two) times a week. Monday  AND THURSDAY  30 tablet  4  . valACYclovir (VALTREX) 500 MG tablet Take 1 tablet (500 mg total) by mouth daily.  30 tablet  12   No current facility-administered medications for this visit.   OBJECTIVE: Middle-aged white woman  in no acute distress There were no vitals filed for this visit.   Body mass index is 30.41 kg/(m^2).     ECOG FS: 0    Zubrod Performance Scale: 0 Filed Weights   04/04/13 0923  Weight: 166 lb 4.8 oz (75.433 kg)   Vitals - 1 value per visit 04/04/2013  SYSTOLIC 134  DIASTOLIC 58  Pulse 91  Temperature 98.3  Respirations 20  Weight (lb)   Height   BMI   VISIT REPORT    Sclerae unicteric, pupils equal and reactive Oropharynx clear and moist-- no thrush--good dentition No cervical or supraclavicular adenopathy; right axillary lymph node resolved Lungs no rales or rhonchi Heart regular rate and rhythm Abd soft, nontender, positive bowel sounds MSK no focal spinal tenderness, no upper extremity lymphedema Neuro: nonfocal, well oriented, appropriate affect Breasts: I do not palpate a mass or see a bruise in the left breast    LAB RESULTS:  Results for TORIANA, SPONSEL (MRN [id number]) as of 04/04/2013 09:24  Ref. Range 06/01/2012 09:34  01/25/2013 12:12 03/07/2013 11:09  IgA Latest Range: 69-380 mg/dL 782 9562 (H) 1308 (H)  Mspi  Results for RODNEY, WIGGER (MRN [id number]) as of 04/04/2013 09:24  Ref. Range 11/05/2012 08:16 12/06/2012 09:41 01/03/2013 09:01 01/25/2013 12:12 03/07/2013 11:09  Lambda Free Lght Chn Latest Range: 0.57-2.63 mg/dL 9.52 8.41 (H) 32.44 (H) 25.50 (H) 31.80 (H)  Results for LARSEN, ZETTEL (MRN [id number]) as of 04/04/2013 09:24  Ref. Range 11/05/2012 08:16 12/06/2012 09:41 01/03/2013 09:01 01/25/2013 12:12 03/07/2013 11:09  M-SPIKE, % No range found NOT DET NOT DET NOT DET 1.18 1.17    Lab Results  Component Value Date   WBC 6.5 04/04/2013   NEUTROABS 4.6 04/04/2013   HGB 10.9* 04/04/2013   HCT 33.6* 04/04/2013   MCV 88.0 04/04/2013   PLT 337 04/04/2013      Chemistry      Component Value Date/Time   NA 142 03/07/2013 1109  NA 143 12/01/2011 0836   K 4.0 03/07/2013 1109   K 4.0 12/01/2011 0836   CL 107 09/27/2012 0842   CL 105 12/01/2011 0836   CO2 23 03/07/2013 1109   CO2 29 12/01/2011 0836   BUN 10.7 03/07/2013 1109   BUN 12 12/01/2011 0836   CREATININE 0.7 03/07/2013 1109   CREATININE 0.52 12/01/2011 0836      Component Value Date/Time   CALCIUM 9.3 03/07/2013 1109   CALCIUM 8.9 12/01/2011 0836   ALKPHOS 53 03/07/2013 1109   ALKPHOS 110 12/01/2011 0836   AST 24 03/07/2013 1109   AST 15 12/01/2011 0836   ALT 12 03/07/2013 1109   ALT 16 12/01/2011 0836   BILITOT 0.25 03/07/2013 1109   BILITOT 0.3 12/01/2011 0836      STUDIES:  No results found.   ASSESSMENT: 57 y.o.  [place] woman presenting with anemia, found to have an IgA >6g, with an M-spike of 4.5 g (and a second M-spike of 0.4g), urine IFE showing IgA-lambda and lambda light chains, creatinine 0.77, calcium 10.0, albumin 3.1, and beta-2-microglobulin 5.51; with bone survey showing multiple myeloma lesions and bone marrow biopsy 09/01/2011 showing 75% myeloma cells. 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  (2) on prophylactic coumadin, discontinued as of 12/13/2011.  (3) zolendronic acid every 28 days, 09/19/2011 to 01/05/2012 (five doses)  (4) s/p neupogen mobilization October 2013, with a collection of 4.26 x10^6 CD34 positive cells  (5) status post melphalan at 200 mg/M2 on 02/23/2012, followed by autologous stem cell transplant the following day.  (6) on 06/07/2012 started maintenance bortezomib sq at 1.3 mg/M2 Q2w, with bactrim and acyclovir prophylaxis, the plan being to contuinue this for 2 years as per the HOVON trial  (7) zolendronic acid, given monthly resumed June 2014  (8) disease progression noted with a rise in the IgA lambda fraction September 2014  (9)  being treated according to the [facility] S1304 randomized to the high dose arm, and will be receiving carfilzomib  on days 1, 2, 8, 9, 15, and 16 of every 28 day cycle. Cycle 1 will be given at a reduced dose of 20 mg per meter square, with subsequent cycles given at 56 mg per meter square.   PLAN:  Aicha tolerated the higher dose of carfilzomib without any additional side effects. In particular she has had absolutely no peripheral neuropathy. We are proceeding with cycle 3 today, although we do not yet have the results of the lab work from 3 days ago. We should have that by tomorrow, though, and we will make sure she gets a copy.  Assuming we are seeing some evidence of response, and the plan of course is to continue on the SWOG study. She knows to call for any problems that may develop before her next visit here.  Lowella Dell, MD     04/04/2013

## 2013-04-04 NOTE — Telephone Encounter (Signed)
Per staff message and POF I have scheduled appts.  JMW  

## 2013-04-04 NOTE — Progress Notes (Signed)
04/04/13 @ 10:30 am, SWOG Z6109, Cycle 3, Day 1:  Laura Hester, accompanied by her husband, into the [facility] for labs, to see Dr. Darnelle Catalan for a physical assessment, and to receive high-dose carfilzomib per protocol.  Her labs were all appropriate to proceed, although her disease assessment labs are not yet finalized.  She denies any adverse events, including sensory neuropathy.  She report feeling great, eating well and exercising.  Her Zubrod performance status is 0.   Spoke with Ceasar Mons, RN in the infusion area about her treatment for today and provided her with a sign for the pump.  Emphasized the duration of the carfilzomib infusion. 04/05/13 @ 1:15 pm: C3  D2:  Laura Hester into the [facility] for Day 2 treatment with high dose carfilzomib.  Spoke with Reesa Chew, RN in the infusion area regarding the duration of treatment and provided her with a sign for the pump.  04/11/13 @ 2:00 pm: C3 D8: Laura Hester into the [facility] for Day 8 treatment with high dose carfilzomib.  Spoke with Waynard Edwards, RN in the infusion area regarding the duration of treatment and provided her with a sign for the pump.  04/12/13 @ 1:45 pm: C3 D9:  Laura Hester into the [facility] for Day 9 treatment with high dose carfilzomib.  Spoke with Ernesta Amble, RN in the infusion area regarding the duration of treatment and provided her with a sign for the pump.  04/18/14 @ 1:30 pm, C3 D15:  Laura Hester into the [facility] for Day 15 treatment with high dose carfilzomib.  Spoke with Chauncy Lean, RN in the infusion area and provided her with a sign for the pump, emphazing the duration of the carfilzomib infusion.

## 2013-04-04 NOTE — Telephone Encounter (Signed)
, °

## 2013-04-04 NOTE — Patient Instructions (Signed)
[facility] Discharge Instructions for Patients Receiving Chemotherapy  Today you received the following chemotherapy agents Kyprolis/Zometa To help prevent nausea and vomiting after your treatment, we encourage you to take your nausea medication as prescribed.  If you develop nausea and vomiting that is not controlled by your nausea medication, call the clinic.   BELOW ARE SYMPTOMS THAT SHOULD BE REPORTED IMMEDIATELY:  *FEVER GREATER THAN 100.5 F  *CHILLS WITH OR WITHOUT FEVER  NAUSEA AND VOMITING THAT IS NOT CONTROLLED WITH YOUR NAUSEA MEDICATION  *UNUSUAL SHORTNESS OF BREATH  *UNUSUAL BRUISING OR BLEEDING  TENDERNESS IN MOUTH AND THROAT WITH OR WITHOUT PRESENCE OF ULCERS  *URINARY PROBLEMS  *BOWEL PROBLEMS  UNUSUAL RASH Items with * indicate a potential emergency and should be followed up as soon as possible.  Feel free to call the clinic you have any questions or concerns. The clinic phone number is [phone].    

## 2013-04-05 ENCOUNTER — Ambulatory Visit (HOSPITAL_BASED_OUTPATIENT_CLINIC_OR_DEPARTMENT_OTHER): Payer: BC Managed Care – PPO

## 2013-04-05 ENCOUNTER — Other Ambulatory Visit: Payer: Self-pay | Admitting: Oncology

## 2013-04-05 VITALS — BP 137/55 | HR 99 | Temp 98.6°F | Resp 20

## 2013-04-05 DIAGNOSIS — C9000 Multiple myeloma not having achieved remission: Secondary | ICD-10-CM

## 2013-04-05 DIAGNOSIS — Z5112 Encounter for antineoplastic immunotherapy: Secondary | ICD-10-CM

## 2013-04-05 LAB — UIFE/LIGHT CHAINS/TP QN, 24-HR UR
Albumin, U: DETECTED
Alpha 1, Urine: DETECTED — AB
Free Kappa Lt Chains,Ur: 0.13 mg/dL — ABNORMAL LOW (ref 0.14–2.42)
Free Kappa/Lambda Ratio: 2.6 ratio (ref 2.04–10.37)
Free Lambda Excretion/Day: 1.3 mg/d
Free Lambda Lt Chains,Ur: 0.05 mg/dL (ref 0.02–0.67)
Free Lt Chn Excr Rate: 3.38 mg/d
Gamma Globulin, Urine: DETECTED — AB
Time: 24 hours
Total Protein, Urine: 5.4 mg/dL
Volume, Urine: 2600 mL

## 2013-04-05 LAB — UPEP/TP, 24-HR URINE
Alpha-1-Globulin, U: 22.6 %
Collection Interval: 24 hours
Total Protein, Urine/Day: 234 mg/d — ABNORMAL HIGH (ref 50–100)
Total Protein, Urine: 9 mg/dL
Total Volume, Urine: 2600 mL

## 2013-04-05 LAB — SPEP & IFE WITH QIG
Albumin ELP: 53.7 % — ABNORMAL LOW (ref 55.8–66.1)
Alpha-1-Globulin: 5.1 % — ABNORMAL HIGH (ref 2.9–4.9)
Alpha-2-Globulin: 11.9 % — ABNORMAL HIGH (ref 7.1–11.8)
Beta 2: 6.8 % — ABNORMAL HIGH (ref 3.2–6.5)
Beta Globulin: 15.7 % — ABNORMAL HIGH (ref 4.7–7.2)
Gamma Globulin: 6.8 % — ABNORMAL LOW (ref 11.1–18.8)
IgA: 994 mg/dL — ABNORMAL HIGH (ref 69–380)
IgM, Serum: 8 mg/dL — ABNORMAL LOW (ref 52–322)

## 2013-04-05 LAB — IGE: IgE (Immunoglobulin E), Serum: 6.8 IU/mL (ref 0.0–180.0)

## 2013-04-05 LAB — IGD: IgD: <2 mg/L (ref <179)

## 2013-04-05 LAB — KAPPA/LAMBDA LIGHT CHAINS
Kappa:Lambda Ratio: 0 — ABNORMAL LOW (ref 0.26–1.65)
Lambda Free Lght Chn: 8.45 mg/dL — ABNORMAL HIGH (ref 0.57–2.63)

## 2013-04-05 MED ORDER — DEXAMETHASONE SODIUM PHOSPHATE 20 MG/5ML IJ SOLN
INTRAMUSCULAR | Status: AC
  Filled 2013-04-05: qty 5

## 2013-04-05 MED ORDER — INV-CARFILZOMIB CHEMO INJECTION 60 MG SWOG S1304
56.0000 mg/m2 | Freq: Once | INTRAVENOUS | Status: AC
  Administered 2013-04-05: 102 mg via INTRAVENOUS
  Filled 2013-04-05: qty 51

## 2013-04-05 MED ORDER — ONDANSETRON 8 MG/NS 50 ML IVPB
INTRAVENOUS | Status: AC
  Filled 2013-04-05: qty 8

## 2013-04-05 MED ORDER — ONDANSETRON 8 MG/50ML IVPB (CHCC)
8.0000 mg | Freq: Once | INTRAVENOUS | Status: AC
  Administered 2013-04-05: 8 mg via INTRAVENOUS

## 2013-04-05 MED ORDER — SODIUM CHLORIDE 0.9 % IJ SOLN
10.0000 mL | INTRAMUSCULAR | Status: DC | PRN
  Administered 2013-04-05: 10 mL
  Filled 2013-04-05: qty 10

## 2013-04-05 MED ORDER — SODIUM CHLORIDE 0.9 % IV SOLN
Freq: Once | INTRAVENOUS | Status: AC
  Administered 2013-04-05: 13:00:00 via INTRAVENOUS

## 2013-04-05 MED ORDER — DEXAMETHASONE SODIUM PHOSPHATE 20 MG/5ML IJ SOLN
20.0000 mg | Freq: Once | INTRAMUSCULAR | Status: AC
  Administered 2013-04-05: 20 mg via INTRAVENOUS

## 2013-04-05 MED ORDER — HEPARIN SOD (PORK) LOCK FLUSH 100 UNIT/ML IV SOLN
500.0000 [IU] | Freq: Once | INTRAVENOUS | Status: AC | PRN
  Administered 2013-04-05: 500 [IU]
  Filled 2013-04-05: qty 5

## 2013-04-11 ENCOUNTER — Other Ambulatory Visit: Payer: BC Managed Care – PPO | Admitting: Lab

## 2013-04-11 ENCOUNTER — Ambulatory Visit (HOSPITAL_BASED_OUTPATIENT_CLINIC_OR_DEPARTMENT_OTHER): Payer: BC Managed Care – PPO

## 2013-04-11 VITALS — BP 140/56 | HR 93 | Temp 97.4°F | Resp 17

## 2013-04-11 DIAGNOSIS — C9000 Multiple myeloma not having achieved remission: Secondary | ICD-10-CM

## 2013-04-11 DIAGNOSIS — Z5112 Encounter for antineoplastic immunotherapy: Secondary | ICD-10-CM

## 2013-04-11 MED ORDER — ONDANSETRON 8 MG/NS 50 ML IVPB
INTRAVENOUS | Status: AC
  Filled 2013-04-11: qty 8

## 2013-04-11 MED ORDER — INV-CARFILZOMIB CHEMO INJECTION 60 MG SWOG S1304
56.0000 mg/m2 | Freq: Once | INTRAVENOUS | Status: AC
  Administered 2013-04-11: 102 mg via INTRAVENOUS
  Filled 2013-04-11: qty 51

## 2013-04-11 MED ORDER — DEXAMETHASONE SODIUM PHOSPHATE 20 MG/5ML IJ SOLN
INTRAMUSCULAR | Status: AC
  Filled 2013-04-11: qty 5

## 2013-04-11 MED ORDER — SODIUM CHLORIDE 0.9 % IV SOLN
Freq: Once | INTRAVENOUS | Status: AC
  Administered 2013-04-11: 14:00:00 via INTRAVENOUS

## 2013-04-11 MED ORDER — DEXAMETHASONE SODIUM PHOSPHATE 20 MG/5ML IJ SOLN
20.0000 mg | Freq: Once | INTRAMUSCULAR | Status: AC
  Administered 2013-04-11: 20 mg via INTRAVENOUS

## 2013-04-11 MED ORDER — ONDANSETRON 8 MG/50ML IVPB (CHCC)
8.0000 mg | Freq: Once | INTRAVENOUS | Status: AC
  Administered 2013-04-11: 8 mg via INTRAVENOUS

## 2013-04-11 MED ORDER — SODIUM CHLORIDE 0.9 % IJ SOLN
10.0000 mL | INTRAMUSCULAR | Status: DC | PRN
  Administered 2013-04-11: 10 mL
  Filled 2013-04-11: qty 10

## 2013-04-11 MED ORDER — HEPARIN SOD (PORK) LOCK FLUSH 100 UNIT/ML IV SOLN
500.0000 [IU] | Freq: Once | INTRAVENOUS | Status: AC | PRN
  Administered 2013-04-11: 500 [IU]
  Filled 2013-04-11: qty 5

## 2013-04-11 NOTE — Patient Instructions (Signed)
[facility] Discharge Instructions for Patients Receiving Chemotherapy  Today you received the following chemotherapy agents Kyprolis To help prevent nausea and vomiting after your treatment, we encourage you to take your nausea medication as prescribed.  If you develop nausea and vomiting that is not controlled by your nausea medication, call the clinic.   BELOW ARE SYMPTOMS THAT SHOULD BE REPORTED IMMEDIATELY:  *FEVER GREATER THAN 100.5 F  *CHILLS WITH OR WITHOUT FEVER  NAUSEA AND VOMITING THAT IS NOT CONTROLLED WITH YOUR NAUSEA MEDICATION  *UNUSUAL SHORTNESS OF BREATH  *UNUSUAL BRUISING OR BLEEDING  TENDERNESS IN MOUTH AND THROAT WITH OR WITHOUT PRESENCE OF ULCERS  *URINARY PROBLEMS  *BOWEL PROBLEMS  UNUSUAL RASH Items with * indicate a potential emergency and should be followed up as soon as possible.  Feel free to call the clinic you have any questions or concerns. The clinic phone number is [phone].    

## 2013-04-12 ENCOUNTER — Ambulatory Visit (HOSPITAL_BASED_OUTPATIENT_CLINIC_OR_DEPARTMENT_OTHER): Payer: BC Managed Care – PPO

## 2013-04-12 VITALS — BP 127/59 | HR 90 | Temp 98.8°F | Resp 18

## 2013-04-12 DIAGNOSIS — Z5112 Encounter for antineoplastic immunotherapy: Secondary | ICD-10-CM

## 2013-04-12 DIAGNOSIS — C9000 Multiple myeloma not having achieved remission: Secondary | ICD-10-CM

## 2013-04-12 MED ORDER — DEXAMETHASONE SODIUM PHOSPHATE 20 MG/5ML IJ SOLN
20.0000 mg | Freq: Once | INTRAMUSCULAR | Status: AC
  Administered 2013-04-12: 20 mg via INTRAVENOUS

## 2013-04-12 MED ORDER — SODIUM CHLORIDE 0.9 % IV SOLN
Freq: Once | INTRAVENOUS | Status: AC
  Administered 2013-04-12: 14:00:00 via INTRAVENOUS

## 2013-04-12 MED ORDER — ONDANSETRON 8 MG/NS 50 ML IVPB
INTRAVENOUS | Status: AC
  Filled 2013-04-12: qty 8

## 2013-04-12 MED ORDER — DEXAMETHASONE SODIUM PHOSPHATE 20 MG/5ML IJ SOLN
INTRAMUSCULAR | Status: AC
  Filled 2013-04-12: qty 5

## 2013-04-12 MED ORDER — SODIUM CHLORIDE 0.9 % IJ SOLN
10.0000 mL | INTRAMUSCULAR | Status: DC | PRN
  Administered 2013-04-12: 10 mL
  Filled 2013-04-12: qty 10

## 2013-04-12 MED ORDER — ONDANSETRON 8 MG/50ML IVPB (CHCC)
8.0000 mg | Freq: Once | INTRAVENOUS | Status: AC
  Administered 2013-04-12: 8 mg via INTRAVENOUS

## 2013-04-12 MED ORDER — INV-CARFILZOMIB CHEMO INJECTION 60 MG SWOG S1304
56.0000 mg/m2 | Freq: Once | INTRAVENOUS | Status: AC
  Administered 2013-04-12: 102 mg via INTRAVENOUS
  Filled 2013-04-12: qty 51

## 2013-04-12 MED ORDER — HEPARIN SOD (PORK) LOCK FLUSH 100 UNIT/ML IV SOLN
500.0000 [IU] | Freq: Once | INTRAVENOUS | Status: AC | PRN
  Administered 2013-04-12: 500 [IU]
  Filled 2013-04-12: qty 5

## 2013-04-12 NOTE — Patient Instructions (Signed)
[facility] Discharge Instructions for Patients Receiving Chemotherapy  Today you received the following chemotherapy agents Kyprolis To help prevent nausea and vomiting after your treatment, we encourage you to take your nausea medication as prescribed.  If you develop nausea and vomiting that is not controlled by your nausea medication, call the clinic.   BELOW ARE SYMPTOMS THAT SHOULD BE REPORTED IMMEDIATELY:  *FEVER GREATER THAN 100.5 F  *CHILLS WITH OR WITHOUT FEVER  NAUSEA AND VOMITING THAT IS NOT CONTROLLED WITH YOUR NAUSEA MEDICATION  *UNUSUAL SHORTNESS OF BREATH  *UNUSUAL BRUISING OR BLEEDING  TENDERNESS IN MOUTH AND THROAT WITH OR WITHOUT PRESENCE OF ULCERS  *URINARY PROBLEMS  *BOWEL PROBLEMS  UNUSUAL RASH Items with * indicate a potential emergency and should be followed up as soon as possible.  Feel free to call the clinic you have any questions or concerns. The clinic phone number is [phone].    

## 2013-04-18 ENCOUNTER — Ambulatory Visit (HOSPITAL_BASED_OUTPATIENT_CLINIC_OR_DEPARTMENT_OTHER): Payer: BC Managed Care – PPO

## 2013-04-18 VITALS — BP 122/64 | HR 92 | Temp 98.8°F | Resp 18

## 2013-04-18 DIAGNOSIS — Z5112 Encounter for antineoplastic immunotherapy: Secondary | ICD-10-CM

## 2013-04-18 DIAGNOSIS — C9000 Multiple myeloma not having achieved remission: Secondary | ICD-10-CM

## 2013-04-18 MED ORDER — DEXAMETHASONE SODIUM PHOSPHATE 20 MG/5ML IJ SOLN
20.0000 mg | Freq: Once | INTRAMUSCULAR | Status: AC
  Administered 2013-04-18: 20 mg via INTRAVENOUS

## 2013-04-18 MED ORDER — ONDANSETRON 8 MG/NS 50 ML IVPB
INTRAVENOUS | Status: AC
Start: 2013-04-18 — End: 2013-04-18
  Filled 2013-04-18: qty 8

## 2013-04-18 MED ORDER — ONDANSETRON 8 MG/50ML IVPB (CHCC)
8.0000 mg | Freq: Once | INTRAVENOUS | Status: AC
  Administered 2013-04-18: 8 mg via INTRAVENOUS

## 2013-04-18 MED ORDER — DEXAMETHASONE SODIUM PHOSPHATE 10 MG/ML IJ SOLN
INTRAMUSCULAR | Status: AC
  Filled 2013-04-18: qty 1

## 2013-04-18 MED ORDER — SODIUM CHLORIDE 0.9 % IJ SOLN
10.0000 mL | INTRAMUSCULAR | Status: DC | PRN
  Administered 2013-04-18: 10 mL
  Filled 2013-04-18: qty 10

## 2013-04-18 MED ORDER — DEXTROSE 5 % IV SOLN
56.0000 mg/m2 | Freq: Once | INTRAVENOUS | Status: AC
  Administered 2013-04-18: 102 mg via INTRAVENOUS
  Filled 2013-04-18: qty 51

## 2013-04-18 MED ORDER — SODIUM CHLORIDE 0.9 % IV SOLN
Freq: Once | INTRAVENOUS | Status: AC
  Administered 2013-04-18: 12:00:00 via INTRAVENOUS

## 2013-04-18 MED ORDER — DEXAMETHASONE SODIUM PHOSPHATE 20 MG/5ML IJ SOLN
INTRAMUSCULAR | Status: AC
  Filled 2013-04-18: qty 5

## 2013-04-18 MED ORDER — HEPARIN SOD (PORK) LOCK FLUSH 100 UNIT/ML IV SOLN
500.0000 [IU] | Freq: Once | INTRAVENOUS | Status: AC | PRN
  Administered 2013-04-18: 500 [IU]
  Filled 2013-04-18: qty 5

## 2013-04-18 NOTE — Patient Instructions (Signed)
[facility] Discharge Instructions for Patients Receiving Chemotherapy  Today you received the following chemotherapy agent Kyprolis.  To help prevent nausea and vomiting after your treatment, we encourage you to take your nausea medication.   If you develop nausea and vomiting that is not controlled by your nausea medication, call the clinic.   BELOW ARE SYMPTOMS THAT SHOULD BE REPORTED IMMEDIATELY:  *FEVER GREATER THAN 100.5 F  *CHILLS WITH OR WITHOUT FEVER  NAUSEA AND VOMITING THAT IS NOT CONTROLLED WITH YOUR NAUSEA MEDICATION  *UNUSUAL SHORTNESS OF BREATH  *UNUSUAL BRUISING OR BLEEDING  TENDERNESS IN MOUTH AND THROAT WITH OR WITHOUT PRESENCE OF ULCERS  *URINARY PROBLEMS  *BOWEL PROBLEMS  UNUSUAL RASH Items with * indicate a potential emergency and should be followed up as soon as possible.  Feel free to call the clinic you have any questions or concerns. The clinic phone number is [phone].    

## 2013-04-19 ENCOUNTER — Ambulatory Visit (HOSPITAL_BASED_OUTPATIENT_CLINIC_OR_DEPARTMENT_OTHER): Payer: BC Managed Care – PPO

## 2013-04-19 ENCOUNTER — Telehealth: Payer: Self-pay | Admitting: Oncology

## 2013-04-19 ENCOUNTER — Encounter: Payer: Self-pay | Admitting: *Deleted

## 2013-04-19 VITALS — BP 141/61 | HR 97 | Temp 97.9°F | Resp 17

## 2013-04-19 DIAGNOSIS — C9000 Multiple myeloma not having achieved remission: Secondary | ICD-10-CM

## 2013-04-19 DIAGNOSIS — Z5112 Encounter for antineoplastic immunotherapy: Secondary | ICD-10-CM

## 2013-04-19 MED ORDER — ONDANSETRON 8 MG/50ML IVPB (CHCC)
8.0000 mg | Freq: Once | INTRAVENOUS | Status: AC
Start: 2013-04-19 — End: 2013-04-19
  Administered 2013-04-19: 8 mg via INTRAVENOUS

## 2013-04-19 MED ORDER — ONDANSETRON 8 MG/NS 50 ML IVPB
INTRAVENOUS | Status: AC
  Filled 2013-04-19: qty 8

## 2013-04-19 MED ORDER — DEXAMETHASONE SODIUM PHOSPHATE 20 MG/5ML IJ SOLN
20.0000 mg | Freq: Once | INTRAMUSCULAR | Status: AC
  Administered 2013-04-19: 20 mg via INTRAVENOUS

## 2013-04-19 MED ORDER — SODIUM CHLORIDE 0.9 % IV SOLN
Freq: Once | INTRAVENOUS | Status: AC
  Administered 2013-04-19: 09:00:00 via INTRAVENOUS

## 2013-04-19 MED ORDER — DEXAMETHASONE SODIUM PHOSPHATE 20 MG/5ML IJ SOLN
INTRAMUSCULAR | Status: AC
  Filled 2013-04-19: qty 5

## 2013-04-19 MED ORDER — SODIUM CHLORIDE 0.9 % IJ SOLN
10.0000 mL | INTRAMUSCULAR | Status: DC | PRN
  Administered 2013-04-19: 10 mL
  Filled 2013-04-19: qty 10

## 2013-04-19 MED ORDER — DEXTROSE 5 % IV SOLN
56.0000 mg/m2 | Freq: Once | INTRAVENOUS | Status: AC
  Administered 2013-04-19: 102 mg via INTRAVENOUS
  Filled 2013-04-19: qty 51

## 2013-04-19 MED ORDER — HEPARIN SOD (PORK) LOCK FLUSH 100 UNIT/ML IV SOLN
500.0000 [IU] | Freq: Once | INTRAVENOUS | Status: AC | PRN
  Administered 2013-04-19: 500 [IU]
  Filled 2013-04-19: qty 5

## 2013-04-19 NOTE — Progress Notes (Signed)
04/19/13; Z0017, cycle 3, day 23 Mrs. Tomaselli is in the cancer center today for day 16 treatment on the S1304 clinical trial.  She is not experiencing adverse events at this time. Informed her that the appointment for the next ECHO is being rescheduled for the [facility] location. It is not rescheduled at this time.  Sign for infusion given to A. Quentin Cornwall, RN.

## 2013-04-19 NOTE — Patient Instructions (Signed)
[facility] Discharge Instructions for Patients Receiving Chemotherapy  Today you received the following chemotherapy agents Kyprolis  To help prevent nausea and vomiting after your treatment, we encourage you to take your nausea medication as needed   If you develop nausea and vomiting that is not controlled by your nausea medication, call the clinic.   BELOW ARE SYMPTOMS THAT SHOULD BE REPORTED IMMEDIATELY:  *FEVER GREATER THAN 100.5 F  *CHILLS WITH OR WITHOUT FEVER  NAUSEA AND VOMITING THAT IS NOT CONTROLLED WITH YOUR NAUSEA MEDICATION  *UNUSUAL SHORTNESS OF BREATH  *UNUSUAL BRUISING OR BLEEDING  TENDERNESS IN MOUTH AND THROAT WITH OR WITHOUT PRESENCE OF ULCERS  *URINARY PROBLEMS  *BOWEL PROBLEMS  UNUSUAL RASH Items with * indicate a potential emergency and should be followed up as soon as possible.  Feel free to call the clinic you have any questions or concerns. The clinic phone number is [phone].    

## 2013-04-19 NOTE — Telephone Encounter (Signed)
, °

## 2013-04-25 ENCOUNTER — Ambulatory Visit (HOSPITAL_COMMUNITY): Payer: BC Managed Care – PPO | Attending: Cardiology | Admitting: Radiology

## 2013-04-25 ENCOUNTER — Encounter: Payer: Self-pay | Admitting: Cardiology

## 2013-04-25 ENCOUNTER — Ambulatory Visit (HOSPITAL_COMMUNITY): Payer: BC Managed Care – PPO

## 2013-04-25 DIAGNOSIS — Z9489 Other transplanted organ and tissue status: Secondary | ICD-10-CM | POA: Insufficient documentation

## 2013-04-25 DIAGNOSIS — C9000 Multiple myeloma not having achieved remission: Secondary | ICD-10-CM | POA: Insufficient documentation

## 2013-04-25 DIAGNOSIS — I10 Essential (primary) hypertension: Secondary | ICD-10-CM | POA: Insufficient documentation

## 2013-04-25 DIAGNOSIS — Z006 Encounter for examination for normal comparison and control in clinical research program: Secondary | ICD-10-CM | POA: Insufficient documentation

## 2013-04-25 NOTE — Progress Notes (Signed)
Limited Echocardiogram performed.  

## 2013-04-29 ENCOUNTER — Other Ambulatory Visit: Payer: BC Managed Care – PPO

## 2013-04-29 ENCOUNTER — Ambulatory Visit (HOSPITAL_BASED_OUTPATIENT_CLINIC_OR_DEPARTMENT_OTHER): Payer: BC Managed Care – PPO

## 2013-04-29 VITALS — BP 126/57 | HR 78 | Temp 98.0°F

## 2013-04-29 DIAGNOSIS — Z452 Encounter for adjustment and management of vascular access device: Secondary | ICD-10-CM

## 2013-04-29 DIAGNOSIS — C9000 Multiple myeloma not having achieved remission: Secondary | ICD-10-CM

## 2013-04-29 DIAGNOSIS — Z95828 Presence of other vascular implants and grafts: Secondary | ICD-10-CM

## 2013-04-29 LAB — PRO B NATRIURETIC PEPTIDE: PRO B NATRI PEPTIDE: 194 pg/mL — AB (ref <126)

## 2013-04-29 MED ORDER — SODIUM CHLORIDE 0.9 % IJ SOLN
10.0000 mL | INTRAMUSCULAR | Status: DC | PRN
  Administered 2013-04-29: 10 mL via INTRAVENOUS
  Filled 2013-04-29: qty 10

## 2013-04-29 MED ORDER — HEPARIN SOD (PORK) LOCK FLUSH 100 UNIT/ML IV SOLN
500.0000 [IU] | Freq: Once | INTRAVENOUS | Status: AC
  Administered 2013-04-29: 500 [IU] via INTRAVENOUS
  Filled 2013-04-29: qty 5

## 2013-04-29 NOTE — Patient Instructions (Signed)

## 2013-05-02 ENCOUNTER — Encounter: Payer: Self-pay | Admitting: *Deleted

## 2013-05-02 ENCOUNTER — Other Ambulatory Visit (HOSPITAL_BASED_OUTPATIENT_CLINIC_OR_DEPARTMENT_OTHER): Payer: BC Managed Care – PPO

## 2013-05-02 ENCOUNTER — Ambulatory Visit (HOSPITAL_BASED_OUTPATIENT_CLINIC_OR_DEPARTMENT_OTHER): Payer: BC Managed Care – PPO

## 2013-05-02 ENCOUNTER — Ambulatory Visit (HOSPITAL_BASED_OUTPATIENT_CLINIC_OR_DEPARTMENT_OTHER): Payer: BC Managed Care – PPO | Admitting: Oncology

## 2013-05-02 VITALS — Ht 62.0 in | Wt 168.8 lb

## 2013-05-02 VITALS — BP 134/63 | HR 104 | Temp 98.6°F | Resp 18

## 2013-05-02 DIAGNOSIS — C9000 Multiple myeloma not having achieved remission: Secondary | ICD-10-CM

## 2013-05-02 DIAGNOSIS — D630 Anemia in neoplastic disease: Secondary | ICD-10-CM

## 2013-05-02 DIAGNOSIS — Z5112 Encounter for antineoplastic immunotherapy: Secondary | ICD-10-CM

## 2013-05-02 LAB — COMPREHENSIVE METABOLIC PANEL (CC13)
ALBUMIN: 3.1 g/dL — AB (ref 3.5–5.0)
ALK PHOS: 61 U/L (ref 40–150)
ALT: 14 U/L (ref 0–55)
AST: 17 U/L (ref 5–34)
Anion Gap: 13 mEq/L — ABNORMAL HIGH (ref 3–11)
BUN: 11.8 mg/dL (ref 7.0–26.0)
CALCIUM: 9.2 mg/dL (ref 8.4–10.4)
CHLORIDE: 106 meq/L (ref 98–109)
CO2: 24 mEq/L (ref 22–29)
Creatinine: 0.6 mg/dL (ref 0.6–1.1)
Glucose: 100 mg/dl (ref 70–140)
Potassium: 4.1 mEq/L (ref 3.5–5.1)
SODIUM: 143 meq/L (ref 136–145)
TOTAL PROTEIN: 6.7 g/dL (ref 6.4–8.3)
Total Bilirubin: 0.29 mg/dL (ref 0.20–1.20)

## 2013-05-02 LAB — CBC WITH DIFFERENTIAL/PLATELET
BASO%: 0.6 % (ref 0.0–2.0)
Basophils Absolute: 0 10*3/uL (ref 0.0–0.1)
EOS%: 3 % (ref 0.0–7.0)
Eosinophils Absolute: 0.2 10*3/uL (ref 0.0–0.5)
HEMATOCRIT: 35.8 % (ref 34.8–46.6)
HGB: 11.2 g/dL — ABNORMAL LOW (ref 11.6–15.9)
LYMPH#: 1.1 10*3/uL (ref 0.9–3.3)
LYMPH%: 17 % (ref 14.0–49.7)
MCH: 28.1 pg (ref 25.1–34.0)
MCHC: 31.3 g/dL — AB (ref 31.5–36.0)
MCV: 89.7 fL (ref 79.5–101.0)
MONO#: 0.9 10*3/uL (ref 0.1–0.9)
MONO%: 13.9 % (ref 0.0–14.0)
NEUT#: 4.2 10*3/uL (ref 1.5–6.5)
NEUT%: 65.5 % (ref 38.4–76.8)
NRBC: 1 % — AB (ref 0–0)
Platelets: 346 10*3/uL (ref 145–400)
RBC: 3.99 10*6/uL (ref 3.70–5.45)
RDW: 16.9 % — AB (ref 11.2–14.5)
WBC: 6.4 10*3/uL (ref 3.9–10.3)

## 2013-05-02 LAB — LACTATE DEHYDROGENASE (CC13): LDH: 213 U/L (ref 125–245)

## 2013-05-02 LAB — MAGNESIUM (CC13): Magnesium: 1.8 mg/dl (ref 1.5–2.5)

## 2013-05-02 LAB — PHOSPHORUS: Phosphorus: 4.3 mg/dL (ref 2.3–4.6)

## 2013-05-02 MED ORDER — SODIUM CHLORIDE 0.9 % IJ SOLN
10.0000 mL | Freq: Once | INTRAMUSCULAR | Status: AC
  Administered 2013-05-02: 10 mL
  Filled 2013-05-02: qty 10

## 2013-05-02 MED ORDER — DEXAMETHASONE SODIUM PHOSPHATE 20 MG/5ML IJ SOLN
20.0000 mg | Freq: Once | INTRAMUSCULAR | Status: AC
  Administered 2013-05-02: 20 mg via INTRAVENOUS

## 2013-05-02 MED ORDER — HEPARIN SOD (PORK) LOCK FLUSH 100 UNIT/ML IV SOLN
500.0000 [IU] | Freq: Once | INTRAVENOUS | Status: AC
  Administered 2013-05-02: 500 [IU]
  Filled 2013-05-02: qty 5

## 2013-05-02 MED ORDER — DEXAMETHASONE SODIUM PHOSPHATE 20 MG/5ML IJ SOLN
INTRAMUSCULAR | Status: AC
  Filled 2013-05-02: qty 5

## 2013-05-02 MED ORDER — SODIUM CHLORIDE 0.9 % IV SOLN
Freq: Once | INTRAVENOUS | Status: AC
  Administered 2013-05-02: 11:00:00 via INTRAVENOUS

## 2013-05-02 MED ORDER — HEPARIN SOD (PORK) LOCK FLUSH 100 UNIT/ML IV SOLN
500.0000 [IU] | Freq: Once | INTRAVENOUS | Status: AC | PRN
  Administered 2013-05-02: 500 [IU]
  Filled 2013-05-02: qty 5

## 2013-05-02 MED ORDER — ONDANSETRON 8 MG/50ML IVPB (CHCC)
8.0000 mg | Freq: Once | INTRAVENOUS | Status: AC
  Administered 2013-05-02: 8 mg via INTRAVENOUS

## 2013-05-02 MED ORDER — SODIUM CHLORIDE 0.9 % IJ SOLN
10.0000 mL | INTRAMUSCULAR | Status: DC | PRN
  Administered 2013-05-02: 10 mL
  Filled 2013-05-02: qty 10

## 2013-05-02 MED ORDER — ZOLEDRONIC ACID 4 MG/100ML IV SOLN
4.0000 mg | Freq: Once | INTRAVENOUS | Status: AC
  Administered 2013-05-02: 4 mg via INTRAVENOUS
  Filled 2013-05-02: qty 100

## 2013-05-02 MED ORDER — INV-CARFILZOMIB CHEMO INJECTION 60 MG SWOG S1304
56.0000 mg/m2 | Freq: Once | INTRAVENOUS | Status: AC
  Administered 2013-05-02: 102 mg via INTRAVENOUS
  Filled 2013-05-02: qty 51

## 2013-05-02 MED ORDER — ONDANSETRON 8 MG/NS 50 ML IVPB
INTRAVENOUS | Status: AC
  Filled 2013-05-02: qty 8

## 2013-05-02 NOTE — Progress Notes (Signed)
ID: Laura Hester   DOB: 05-Jun-1955  MR#: [id number]  HTD#:[id number]  PCP: Laura Kroner, MD GYN: Laura Hester SU: OTHER MD: Laura Hester  CHIEF COMPLAINT:  Multiple Myeloma   HISTORY OF PRESENT ILLNESS: Laura Hester has a history of iron deficiency anemia secondary to menorrhagia. However, as this persisted despite near-total cessation of her menses and despite iron supplementation, she was referred to Dr. Teena Hester for GI evaluation. He found the patient's Hb to have decreased from 9.17 June 2011 to 8.3 07/29/2011. MCV was 95.9, ferritin 93.3, with a normal folate and negative ANA. Guaiacs were negative. Creatinine was 0.77.  t-transglutaminase IgA was normal at 2, but the total IgA was 6.9 g. Accordingly on 08/04/2011 an SPEP was obtained, showing an M-spike of 4.5 g, with a secondary M-spike of 0.4 g. Total protein was 10.0 with albumin 3.2, calcium 10.2. Bone survey 08/26/2011 showed multiple bone lesions consistent with myeloma and bone marrow biopsy 09/01/2011 confirmed the diagnosis with 75% myeloma cells in the marrow. Laura Hester' subsequent history is detailed below.  INTERVAL HISTORY: Laura Hester returns today for followup of her multiple myeloma accompanied by her husband Laura Hester. Today is day 1 cycle 4 of her high-dose carfilzomib, which she is receiving according to SWOG 1304.  REVIEW OF SYSTEMS: Laura Hester feels "great". She has died her hair dark, which is very becoming. She is not exercising regularly, but doing a lot of crocheting. She likes  "Silver sneakers" and is planning to get back into that soon. She is minimally constipated from the carfilzomib and manages that well with prunes and a rare Senokot. She denies hard bowel movements. She has absolutely no peripheral neuropathy symptoms. She gets "wired" from the dexamethasone, but knows to take lorazepam those night so she can sleep. Her port is working well. Overall she is doing terrific as far as her treatment is  concerned  PAST MEDICAL HISTORY: Past Medical History  Diagnosis Date  . Fibrocystic breast disease   . Allergic rhinitis   . HTN (hypertension)     mild/ observation only  . Prediabetes   . SUI (stress urinary incontinence, female)   . Overweight (BMI 25.0-29.9)   . Multiple myeloma, stage 3 08/20/11 dx  . Anemia     resolved  . Cancer   . Arthritis     PAST SURGICAL HISTORY: Past Surgical History  Procedure Laterality Date  . Cholecystectomy  1995  . Tonsillectomy and adenoidectomy    . Carpal tunnel release      FAMILY HISTORY Family History  Problem Relation Age of Onset  . Heart disease Father   . Breast cancer Mother   . Heart disease Mother   . Diabetes Paternal Grandfather   . Hypertension Maternal Grandmother   . Hypertension Brother   The patient's parents are in their mid-34s.Her mother has a remote history of breast cancer. She has two brothers, one her twin; no sisters. There is no other history of cancer in the family to her knowledge  GYNECOLOGIC HISTORY: Menarche age 27, first live birth age 39, [place] P26. Last period was January 2013, before that May 2012.  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 Her husband Laura Hester, graduated from [employer] and works in Technical brewer.  Daughter Laura Hester works for [employer], married in 2013. Daughter Laura Hester studies and works in [place] and comes home every other week and The patient attends the [employer]:  Not in place  HEALTH MAINTENANCE: (Updated November 2014) History  Substance Use Topics  . Smoking status: Never Smoker   . Smokeless tobacco: Never Used  . Alcohol Use: No     Colonoscopy: 2009/Laura Hester  PAP: March 2014, Dr. Dellis Hester  Bone density: never  Lipid panel: Laura Hester/ "excellent"  Mammography: June of 2014    Allergies  Allergen Reactions  . Wellbutrin [Bupropion Hcl] Hives    Current  Outpatient Prescriptions  Medication Sig Dispense Refill  . lidocaine-prilocaine (EMLA) cream Apply topically as needed.  30 g  3  . LORazepam (ATIVAN) 0.5 MG tablet Take 1 tablet (0.5 mg total) by mouth at bedtime as needed for anxiety.  30 tablet  0  . naproxen sodium (ANAPROX) 220 MG tablet Take 1 tablet (220 mg total) by mouth 2 (two) times daily with a meal.  60 tablet  6  . omeprazole (PRILOSEC) 20 MG capsule Take 1 capsule (20 mg total) by mouth daily.  30 capsule  12  . potassium chloride SA (K-DUR,KLOR-CON) 20 MEQ tablet Take 1 tablet (20 mEq total) by mouth daily.  90 tablet  1  . sulfamethoxazole-trimethoprim (BACTRIM DS) 800-160 MG per tablet Take 2 tablets by mouth 2 (two) times a week. Monday  AND THURSDAY  30 tablet  4  . valACYclovir (VALTREX) 500 MG tablet Take 1 tablet (500 mg total) by mouth daily.  30 tablet  12   No current facility-administered medications for this visit.   OBJECTIVE: Middle-aged white woman  in no acute distress There were no vitals filed for this visit.   Body mass index is 30.87 kg/(m^2).     ECOG FS: 0    Zubrod Performance Scale: 0 Filed Weights   05/02/13 0934  Weight: 168 lb 12.8 oz (76.567 kg)   Vitals - 1 value per visit 1/61/0960  SYSTOLIC 454  DIASTOLIC 63  Pulse 098  Temperature 98.6  Respirations 18  Weight (lb)   Height   BMI   VISIT REPORT    Vitals - 1 value per visit 05/02/1476  SYSTOLIC   DIASTOLIC   Pulse   Temperature   Respirations   Weight (lb) 168.8  Height _0   BMI 30.87  VISIT REPORT     Sclerae unicteric, pupils equal and round Oropharynx clear and moist-- no thrush--good dentition No cervical or supraclavicular adenopathy; no axillary adenopathy Lungs no rales or rhonchi Heart regular rate and rhythm Abd soft, nontender, positive bowel sounds MSK no focal spinal tenderness, no upper extremity lymphedema Neuro: nonfocal, well oriented, appropriate affect Breasts: Deferred    LAB RESULTS:  Repeat  SPEP and kappa/lambda results from 04/29/2013 are pending  Results for Laura, Hester (MRN [id number]) as of 04/04/2013 09:24  Ref. Range 06/01/2012 09:34 01/25/2013 12:12 03/07/2013 11:09  IgA Latest Range: 69-380 mg/dL 110 2850 (H) 2670 (H)  Mspi  Results for MARSELA, KUAN (MRN [id number]) as of 04/04/2013 09:24  Ref. Range 11/05/2012 08:16 12/06/2012 09:41 01/03/2013 09:01 01/25/2013 12:12 03/07/2013 11:09  Lambda Free Lght Chn Latest Range: 0.57-2.63 mg/dL 1.82 4.19 (H) 19.10 (H) 25.50 (H) 31.80 (H)  Results for MILANYA, SUNDERLAND (MRN [id number]) as of 04/04/2013 09:24  Ref. Range 11/05/2012 08:16 12/06/2012 09:41 01/03/2013 09:01 01/25/2013 12:12 03/07/2013 11:09  M-SPIKE, % No range found NOT DET NOT DET NOT DET 1.18 1.17    Lab Results  Component Value Date   WBC 6.4 05/02/2013   NEUTROABS 4.2 05/02/2013   HGB 11.2* 05/02/2013  HCT 35.8 05/02/2013   MCV 89.7 05/02/2013   PLT 346 05/02/2013      Chemistry      Component Value Date/Time   NA 143 05/02/2013 0910   NA 143 12/01/2011 0836   K 4.1 05/02/2013 0910   K 4.0 12/01/2011 0836   CL 107 09/27/2012 0842   CL 105 12/01/2011 0836   CO2 24 05/02/2013 0910   CO2 29 12/01/2011 0836   BUN 11.8 05/02/2013 0910   BUN 12 12/01/2011 0836   CREATININE 0.6 05/02/2013 0910   CREATININE 0.52 12/01/2011 0836      Component Value Date/Time   CALCIUM 9.2 05/02/2013 0910   CALCIUM 8.9 12/01/2011 0836   ALKPHOS 61 05/02/2013 0910   ALKPHOS 110 12/01/2011 0836   AST 17 05/02/2013 0910   AST 15 12/01/2011 0836   ALT 14 05/02/2013 0910   ALT 16 12/01/2011 0836   BILITOT 0.29 05/02/2013 0910   BILITOT 0.3 12/01/2011 0836      STUDIES:  No results found.   ASSESSMENT: 59 y.o.  [place] woman presenting with anemia, found to have an IgA >6g, with an M-spike of 4.5 g (and a second M-spike of 0.4g), urine IFE showing IgA-lambda and lambda light chains, creatinine 0.77, calcium 10.0, albumin 3.1, and beta-2-microglobulin 5.51; with bone survey  showing multiple myeloma lesions and bone marrow biopsy 09/01/2011 showing 75% myeloma cells. 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  (2) on prophylactic coumadin, discontinued as of 12/13/2011.  (3) zolendronic acid every 28 days, 09/19/2011 to 01/05/2012 (five doses)  (4) s/p neupogen mobilization October 2013, with a collection of 4.26 x10^6 CD34 positive cells  (5) status post melphalan at 200 mg/M2 on 02/23/2012, followed by autologous stem cell transplant the following day.  (6) on 06/07/2012 started maintenance bortezomib sq at 1.3 mg/M2 Q2w, with bactrim and acyclovir prophylaxis, the plan being to contuinue this for 2 years as per the HOVON trial  (7) zolendronic acid, given monthly resumed June 2014  (8) disease progression noted with a rise in the IgA lambda fraction September 2014  (9)  being treated according to the [facility] S1304 randomized to the high dose arm, and will be receiving carfilzomib  on days 1, 2, 8, 9, 15, and 16 of every 28 day cycle. Cycle 1 will be given at a reduced dose of 20 mg per meter square, with subsequent cycles given at 56 mg per meter square.   PLAN:  Deklynn is doing fine as far as her treatment is concerned and we have evidence of response from last month's labs, but of course we just repeated them and are just waiting to get those results. We should have them tomorrow, which she returns for another dose of therapy. Assuming that continues to show response, the plan is to stay on this study, which will go on for a total of 12 cycles assuming she enters remission and there is no progression.  I encouraged her to participate in [place] again. Otherwise she is doing very well overall and will see [place] again in one month. She knows to call for any problems that  may develop before that visit.  Chauncey Cruel, MD     05/02/2013

## 2013-05-02 NOTE — Progress Notes (Signed)
05/02/13 @10 :15 am, SWOG G6659, C4 D1:  [name], accompanied by her husband, into the [facility] for labs, to see Dr. Jana Hakim, and receive treatment with high dose carfilzomib.  She had disease assessment labs drawn and 24 hour urine returned on Friday 04/29/13.  She had an ECHO on 04/25/13 and an EKG on 04/29/13.  All treatment labs were appropriate for treatment, but disease assessment labs have not returned.  [name] is doing very well, has no signs of sensory neuropathy, and continues to be fully active.  Spoke with Amy Horton, RN in the infusion room about patient's treatment and provided her with a sign for the pump.  Emphasized the duration of the infusion of carfilzomib.  05/03/13 @10 :50 am, C4 D2:  [name] in for treatment with high dose carfilzomib.  Spoke with Jonelle Sports, RN and Ann Lions, RN in the infusion room about her treatment for today and provided them with a sign for the pump with the duration of treatment.  Disease assessment labs are not yet final.  05/09/13 @10 :45 am, C4, D8:  [name] in for treatment with high dose carfilzomib.  Spoke with Corinda Gubler, RN in the infusion room and provided her with a sign for the pump with duration of treatment.  Provided [name] with copies of her labs from the beginning of the cycle. She continues to have a response to the carfilzomib, therefore will remain on study treatment.  05/10/13 @10 :30 am, C4, D9:  [name] in for treatment with high dose carfilzomib.  Spoke with Etta Grandchild, RN in the infusion room and provided her with a sign for the pump with duration of treatment.  05/16/13 @ 11:00 am, C4, D15:  [name] into the [facility] for treatment with high dose carfilzomib.  Spoke with Cira Rue, RN in the infusion room and provided her with a sign for the pump with the duration of the treatment.  05/17/13 @ 10:30 am, C4, D16:  [name] into the [facility] for treatment with high dose carfilzomib.   Prior to her treatment, we went through the new consent for SWOG S1304, which incorporates a new lab at the beginning of each cycle, C-reactive protein, and changes to the risk profile of carfilzomib.  We compared the old consent side effects to the new side effects listing.  All of her questions were answered and she agreed to continue treatment per protocol.  She signed the new consent and was given a copy of the signed document for her records.  She was provided with a 24 hour urine collection jug to return on 05/30/13 for disease assessment on 05/30/13. Spoke with Ann Lions, RN in the infusion area about her treatment and provided her with a sign for the pump, emphasizing the duration of carfilzomib.

## 2013-05-02 NOTE — Patient Instructions (Signed)
[facility] Discharge Instructions for Patients Receiving Chemotherapy  Today you received the following chemotherapy agents: Kyprolis  To help prevent nausea and vomiting after your treatment, we encourage you to take your nausea medication: as directed.   If you develop nausea and vomiting that is not controlled by your nausea medication, call the clinic.   BELOW ARE SYMPTOMS THAT SHOULD BE REPORTED IMMEDIATELY:  *FEVER GREATER THAN 100.5 F  *CHILLS WITH OR WITHOUT FEVER  NAUSEA AND VOMITING THAT IS NOT CONTROLLED WITH YOUR NAUSEA MEDICATION  *UNUSUAL SHORTNESS OF BREATH  *UNUSUAL BRUISING OR BLEEDING  TENDERNESS IN MOUTH AND THROAT WITH OR WITHOUT PRESENCE OF ULCERS  *URINARY PROBLEMS  *BOWEL PROBLEMS  UNUSUAL RASH Items with * indicate a potential emergency and should be followed up as soon as possible.  Feel free to call the clinic you have any questions or concerns. The clinic phone number is [phone].    

## 2013-05-03 ENCOUNTER — Encounter: Payer: Self-pay | Admitting: *Deleted

## 2013-05-03 ENCOUNTER — Ambulatory Visit (HOSPITAL_BASED_OUTPATIENT_CLINIC_OR_DEPARTMENT_OTHER): Payer: BC Managed Care – PPO

## 2013-05-03 ENCOUNTER — Other Ambulatory Visit: Payer: Self-pay | Admitting: Oncology

## 2013-05-03 ENCOUNTER — Telehealth: Payer: Self-pay | Admitting: *Deleted

## 2013-05-03 VITALS — BP 121/73 | HR 101 | Temp 98.0°F | Resp 19

## 2013-05-03 DIAGNOSIS — C9000 Multiple myeloma not having achieved remission: Secondary | ICD-10-CM

## 2013-05-03 DIAGNOSIS — Z5112 Encounter for antineoplastic immunotherapy: Secondary | ICD-10-CM

## 2013-05-03 LAB — UPEP/TP, 24-HR URINE
ALBUMIN UR 24 HR ELECTRO: 54 %
ALPHA-2-GLOBULIN, U: 10.4 %
Alpha-1-Globulin, U: 23.7 %
Beta Globulin, U: 5.8 %
Collection Interval: 24 hours
GAMMA GLOBULIN, U: 6.1 %
Total Protein, Urine/Day: 400 mg/d — ABNORMAL HIGH (ref 50–100)
Total Protein, Urine: 16 mg/dL
Total Volume, Urine: 2500 mL

## 2013-05-03 LAB — UIFE/LIGHT CHAINS/TP QN, 24-HR UR
ALBUMIN, U: DETECTED
ALPHA 1 UR: DETECTED — AB
Alpha 2, Urine: DETECTED — AB
BETA UR: DETECTED — AB
FREE LAMBDA EXCRETION/DAY: 2.75 mg/d
FREE LAMBDA LT CHAINS, UR: 0.11 mg/dL (ref 0.02–0.67)
Free Kappa Lt Chains,Ur: 0.2 mg/dL (ref 0.14–2.42)
Free Kappa/Lambda Ratio: 1.82 ratio — ABNORMAL LOW (ref 2.04–10.37)
Free Lt Chn Excr Rate: 5 mg/d
GAMMA UR: DETECTED — AB
TIME-UPE24: 24 h
Total Protein, Urine-Ur/day: 270 mg/d — ABNORMAL HIGH (ref 10–140)
Total Protein, Urine: 10.8 mg/dL
VOLUME, URINE-UPE24: 2500 mL

## 2013-05-03 LAB — SPEP & IFE WITH QIG
ALPHA-1-GLOBULIN: 5.9 % — AB (ref 2.9–4.9)
ALPHA-2-GLOBULIN: 12.4 % — AB (ref 7.1–11.8)
Albumin ELP: 52.4 % — ABNORMAL LOW (ref 55.8–66.1)
Beta 2: 12.4 % — ABNORMAL HIGH (ref 3.2–6.5)
Beta Globulin: 10 % — ABNORMAL HIGH (ref 4.7–7.2)
GAMMA GLOBULIN: 6.9 % — AB (ref 11.1–18.8)
IGM, SERUM: 6 mg/dL — AB (ref 52–322)
IgA: 886 mg/dL — ABNORMAL HIGH (ref 69–380)
IgG (Immunoglobin G), Serum: 234 mg/dL — ABNORMAL LOW (ref 690–1700)
M-Spike, %: 0.33 g/dL
TOTAL PROTEIN, SERUM ELECTROPHOR: 6 g/dL (ref 6.0–8.3)

## 2013-05-03 LAB — CK TOTAL AND CKMB (NOT AT ARMC): Total CK: 22 U/L (ref 7–177)

## 2013-05-03 LAB — KAPPA/LAMBDA LIGHT CHAINS: LAMBDA FREE LGHT CHN: 8.53 mg/dL — AB (ref 0.57–2.63)

## 2013-05-03 LAB — IGE: IGE (IMMUNOGLOBULIN E), SERUM: 6.7 [IU]/mL (ref 0.0–180.0)

## 2013-05-03 LAB — BRAIN NATRIURETIC PEPTIDE: Brain Natriuretic Peptide: 44.3 pg/mL (ref 0.0–100.0)

## 2013-05-03 LAB — IGD: IgD: <2 mg/L (ref <179)

## 2013-05-03 MED ORDER — HEPARIN SOD (PORK) LOCK FLUSH 100 UNIT/ML IV SOLN
500.0000 [IU] | Freq: Once | INTRAVENOUS | Status: AC | PRN
  Administered 2013-05-03: 500 [IU]
  Filled 2013-05-03: qty 5

## 2013-05-03 MED ORDER — ONDANSETRON 8 MG/50ML IVPB (CHCC)
8.0000 mg | Freq: Once | INTRAVENOUS | Status: AC
  Administered 2013-05-03: 8 mg via INTRAVENOUS

## 2013-05-03 MED ORDER — INV-CARFILZOMIB CHEMO INJECTION 60 MG SWOG S1304
56.0000 mg/m2 | Freq: Once | INTRAVENOUS | Status: AC
  Administered 2013-05-03: 102 mg via INTRAVENOUS
  Filled 2013-05-03: qty 51

## 2013-05-03 MED ORDER — DEXAMETHASONE SODIUM PHOSPHATE 20 MG/5ML IJ SOLN
20.0000 mg | Freq: Once | INTRAMUSCULAR | Status: AC
  Administered 2013-05-03: 20 mg via INTRAVENOUS

## 2013-05-03 MED ORDER — ONDANSETRON 8 MG/NS 50 ML IVPB
INTRAVENOUS | Status: AC
  Filled 2013-05-03: qty 8

## 2013-05-03 MED ORDER — SODIUM CHLORIDE 0.9 % IJ SOLN
10.0000 mL | INTRAMUSCULAR | Status: DC | PRN
  Administered 2013-05-03: 10 mL
  Filled 2013-05-03: qty 10

## 2013-05-03 MED ORDER — DEXAMETHASONE SODIUM PHOSPHATE 20 MG/5ML IJ SOLN
INTRAMUSCULAR | Status: AC
  Filled 2013-05-03: qty 5

## 2013-05-03 MED ORDER — SODIUM CHLORIDE 0.9 % IV SOLN
Freq: Once | INTRAVENOUS | Status: AC
  Administered 2013-05-03: 11:00:00 via INTRAVENOUS

## 2013-05-03 NOTE — Patient Instructions (Signed)
[facility] Discharge Instructions for Patients Receiving Chemotherapy  Today you received the following chemotherapy agents Kyprolis  To help prevent nausea and vomiting after your treatment, we encourage you to take your nausea medication as needed   If you develop nausea and vomiting that is not controlled by your nausea medication, call the clinic.   BELOW ARE SYMPTOMS THAT SHOULD BE REPORTED IMMEDIATELY:  *FEVER GREATER THAN 100.5 F  *CHILLS WITH OR WITHOUT FEVER  NAUSEA AND VOMITING THAT IS NOT CONTROLLED WITH YOUR NAUSEA MEDICATION  *UNUSUAL SHORTNESS OF BREATH  *UNUSUAL BRUISING OR BLEEDING  TENDERNESS IN MOUTH AND THROAT WITH OR WITHOUT PRESENCE OF ULCERS  *URINARY PROBLEMS  *BOWEL PROBLEMS  UNUSUAL RASH Items with * indicate a potential emergency and should be followed up as soon as possible.  Feel free to call the clinic you have any questions or concerns. The clinic phone number is [phone].    

## 2013-05-03 NOTE — Progress Notes (Signed)
Chaplain made follow-up visit with pt. She continues to thrive. Chaplain practiced active listening, affirmation, and ministry of presence.

## 2013-05-03 NOTE — Telephone Encounter (Signed)
This RN called pt with results of labs per MD review.

## 2013-05-04 ENCOUNTER — Telehealth: Payer: Self-pay | Admitting: Oncology

## 2013-05-04 NOTE — Telephone Encounter (Signed)
Sent michelle a staff message to add the chemo appts for feb and march.

## 2013-05-05 ENCOUNTER — Telehealth: Payer: Self-pay | Admitting: *Deleted

## 2013-05-05 NOTE — Telephone Encounter (Signed)
Per staff message and POF I have scheduled appts.  JMW  

## 2013-05-09 ENCOUNTER — Ambulatory Visit (HOSPITAL_BASED_OUTPATIENT_CLINIC_OR_DEPARTMENT_OTHER): Payer: BC Managed Care – PPO

## 2013-05-09 VITALS — BP 122/73 | HR 95 | Temp 98.9°F | Resp 18

## 2013-05-09 DIAGNOSIS — Z5112 Encounter for antineoplastic immunotherapy: Secondary | ICD-10-CM

## 2013-05-09 DIAGNOSIS — C9000 Multiple myeloma not having achieved remission: Secondary | ICD-10-CM

## 2013-05-09 MED ORDER — SODIUM CHLORIDE 0.9 % IV SOLN
Freq: Once | INTRAVENOUS | Status: AC
  Administered 2013-05-09: 11:00:00 via INTRAVENOUS

## 2013-05-09 MED ORDER — DEXAMETHASONE SODIUM PHOSPHATE 20 MG/5ML IJ SOLN
20.0000 mg | Freq: Once | INTRAMUSCULAR | Status: AC
  Administered 2013-05-09: 20 mg via INTRAVENOUS

## 2013-05-09 MED ORDER — ONDANSETRON 8 MG/50ML IVPB (CHCC)
8.0000 mg | Freq: Once | INTRAVENOUS | Status: AC
  Administered 2013-05-09: 8 mg via INTRAVENOUS

## 2013-05-09 MED ORDER — INV-CARFILZOMIB CHEMO INJECTION 60 MG SWOG S1304
56.0000 mg/m2 | Freq: Once | INTRAVENOUS | Status: AC
  Administered 2013-05-09: 102 mg via INTRAVENOUS
  Filled 2013-05-09: qty 51

## 2013-05-09 MED ORDER — DEXAMETHASONE SODIUM PHOSPHATE 20 MG/5ML IJ SOLN
INTRAMUSCULAR | Status: AC
  Filled 2013-05-09: qty 5

## 2013-05-09 MED ORDER — ONDANSETRON 8 MG/NS 50 ML IVPB
INTRAVENOUS | Status: AC
  Filled 2013-05-09: qty 8

## 2013-05-09 MED ORDER — SODIUM CHLORIDE 0.9 % IJ SOLN
10.0000 mL | INTRAMUSCULAR | Status: DC | PRN
  Administered 2013-05-09: 10 mL
  Filled 2013-05-09: qty 10

## 2013-05-09 MED ORDER — HEPARIN SOD (PORK) LOCK FLUSH 100 UNIT/ML IV SOLN
500.0000 [IU] | Freq: Once | INTRAVENOUS | Status: AC | PRN
  Administered 2013-05-09: 500 [IU]
  Filled 2013-05-09: qty 5

## 2013-05-09 NOTE — Patient Instructions (Signed)
[facility] Discharge Instructions for Patients Receiving Chemotherapy  Today you received the following chemotherapy agents Kyprolis To help prevent nausea and vomiting after your treatment, we encourage you to take your nausea medication as prescribed.  If you develop nausea and vomiting that is not controlled by your nausea medication, call the clinic.   BELOW ARE SYMPTOMS THAT SHOULD BE REPORTED IMMEDIATELY:  *FEVER GREATER THAN 100.5 F  *CHILLS WITH OR WITHOUT FEVER  NAUSEA AND VOMITING THAT IS NOT CONTROLLED WITH YOUR NAUSEA MEDICATION  *UNUSUAL SHORTNESS OF BREATH  *UNUSUAL BRUISING OR BLEEDING  TENDERNESS IN MOUTH AND THROAT WITH OR WITHOUT PRESENCE OF ULCERS  *URINARY PROBLEMS  *BOWEL PROBLEMS  UNUSUAL RASH Items with * indicate a potential emergency and should be followed up as soon as possible.  Feel free to call the clinic you have any questions or concerns. The clinic phone number is [phone].    

## 2013-05-10 ENCOUNTER — Ambulatory Visit (HOSPITAL_BASED_OUTPATIENT_CLINIC_OR_DEPARTMENT_OTHER): Payer: BC Managed Care – PPO

## 2013-05-10 VITALS — BP 135/73 | HR 93 | Temp 97.7°F | Resp 18

## 2013-05-10 DIAGNOSIS — Z5112 Encounter for antineoplastic immunotherapy: Secondary | ICD-10-CM

## 2013-05-10 DIAGNOSIS — C9000 Multiple myeloma not having achieved remission: Secondary | ICD-10-CM

## 2013-05-10 MED ORDER — HEPARIN SOD (PORK) LOCK FLUSH 100 UNIT/ML IV SOLN
500.0000 [IU] | Freq: Once | INTRAVENOUS | Status: AC | PRN
  Administered 2013-05-10: 500 [IU]
  Filled 2013-05-10: qty 5

## 2013-05-10 MED ORDER — INV-CARFILZOMIB CHEMO INJECTION 60 MG SWOG S1304
56.0000 mg/m2 | Freq: Once | INTRAVENOUS | Status: AC
  Administered 2013-05-10: 102 mg via INTRAVENOUS
  Filled 2013-05-10: qty 51

## 2013-05-10 MED ORDER — ONDANSETRON 8 MG/NS 50 ML IVPB
INTRAVENOUS | Status: AC
  Filled 2013-05-10: qty 8

## 2013-05-10 MED ORDER — DEXAMETHASONE SODIUM PHOSPHATE 20 MG/5ML IJ SOLN
20.0000 mg | Freq: Once | INTRAMUSCULAR | Status: AC
  Administered 2013-05-10: 20 mg via INTRAVENOUS

## 2013-05-10 MED ORDER — ONDANSETRON 8 MG/50ML IVPB (CHCC)
8.0000 mg | Freq: Once | INTRAVENOUS | Status: AC
  Administered 2013-05-10: 8 mg via INTRAVENOUS

## 2013-05-10 MED ORDER — SODIUM CHLORIDE 0.9 % IJ SOLN
10.0000 mL | INTRAMUSCULAR | Status: DC | PRN
  Administered 2013-05-10: 10 mL
  Filled 2013-05-10: qty 10

## 2013-05-10 MED ORDER — DEXAMETHASONE SODIUM PHOSPHATE 20 MG/5ML IJ SOLN
INTRAMUSCULAR | Status: AC
  Filled 2013-05-10: qty 5

## 2013-05-10 MED ORDER — SODIUM CHLORIDE 0.9 % IV SOLN
Freq: Once | INTRAVENOUS | Status: AC
Start: 2013-05-10 — End: 2013-05-10
  Administered 2013-05-10: 10:00:00 via INTRAVENOUS

## 2013-05-10 NOTE — Patient Instructions (Signed)
[facility] Discharge Instructions for Patients Receiving Chemotherapy  Today you received the following chemotherapy agents Kyprolis To help prevent nausea and vomiting after your treatment, we encourage you to take your nausea medication as prescribed.  If you develop nausea and vomiting that is not controlled by your nausea medication, call the clinic.   BELOW ARE SYMPTOMS THAT SHOULD BE REPORTED IMMEDIATELY:  *FEVER GREATER THAN 100.5 F  *CHILLS WITH OR WITHOUT FEVER  NAUSEA AND VOMITING THAT IS NOT CONTROLLED WITH YOUR NAUSEA MEDICATION  *UNUSUAL SHORTNESS OF BREATH  *UNUSUAL BRUISING OR BLEEDING  TENDERNESS IN MOUTH AND THROAT WITH OR WITHOUT PRESENCE OF ULCERS  *URINARY PROBLEMS  *BOWEL PROBLEMS  UNUSUAL RASH Items with * indicate a potential emergency and should be followed up as soon as possible.  Feel free to call the clinic you have any questions or concerns. The clinic phone number is [phone].    

## 2013-05-16 ENCOUNTER — Ambulatory Visit (HOSPITAL_BASED_OUTPATIENT_CLINIC_OR_DEPARTMENT_OTHER): Payer: BC Managed Care – PPO

## 2013-05-16 VITALS — BP 144/72 | HR 98 | Temp 98.6°F | Resp 18

## 2013-05-16 DIAGNOSIS — C9000 Multiple myeloma not having achieved remission: Secondary | ICD-10-CM

## 2013-05-16 DIAGNOSIS — Z5112 Encounter for antineoplastic immunotherapy: Secondary | ICD-10-CM

## 2013-05-16 MED ORDER — ONDANSETRON 8 MG/50ML IVPB (CHCC)
8.0000 mg | Freq: Once | INTRAVENOUS | Status: AC
  Administered 2013-05-16: 8 mg via INTRAVENOUS

## 2013-05-16 MED ORDER — INV-CARFILZOMIB CHEMO INJECTION 60 MG SWOG S1304
56.0000 mg/m2 | Freq: Once | INTRAVENOUS | Status: AC
  Administered 2013-05-16: 102 mg via INTRAVENOUS
  Filled 2013-05-16: qty 51

## 2013-05-16 MED ORDER — DEXAMETHASONE SODIUM PHOSPHATE 20 MG/5ML IJ SOLN
INTRAMUSCULAR | Status: AC
  Filled 2013-05-16: qty 5

## 2013-05-16 MED ORDER — SODIUM CHLORIDE 0.9 % IV SOLN
Freq: Once | INTRAVENOUS | Status: AC
  Administered 2013-05-16: 11:00:00 via INTRAVENOUS

## 2013-05-16 MED ORDER — ONDANSETRON 8 MG/NS 50 ML IVPB
INTRAVENOUS | Status: AC
  Filled 2013-05-16: qty 8

## 2013-05-16 MED ORDER — HEPARIN SOD (PORK) LOCK FLUSH 100 UNIT/ML IV SOLN
500.0000 [IU] | Freq: Once | INTRAVENOUS | Status: AC | PRN
  Administered 2013-05-16: 500 [IU]
  Filled 2013-05-16: qty 5

## 2013-05-16 MED ORDER — SODIUM CHLORIDE 0.9 % IJ SOLN
10.0000 mL | INTRAMUSCULAR | Status: DC | PRN
  Administered 2013-05-16: 10 mL
  Filled 2013-05-16: qty 10

## 2013-05-16 MED ORDER — DEXAMETHASONE SODIUM PHOSPHATE 20 MG/5ML IJ SOLN
20.0000 mg | Freq: Once | INTRAMUSCULAR | Status: AC
  Administered 2013-05-16: 20 mg via INTRAVENOUS

## 2013-05-16 NOTE — Progress Notes (Signed)
Treatment parameters verified with Baker Janus, Research RN upon patient's arrival to infusion room.

## 2013-05-16 NOTE — Patient Instructions (Signed)
[facility] Discharge Instructions for Patients Receiving Chemotherapy  Today you received the following chemotherapy agent: Kyprolis  To help prevent nausea and vomiting after your treatment, we encourage you to take your nausea medication as prescribed.    If you develop nausea and vomiting that is not controlled by your nausea medication, call the clinic.   BELOW ARE SYMPTOMS THAT SHOULD BE REPORTED IMMEDIATELY:  *FEVER GREATER THAN 100.5 F  *CHILLS WITH OR WITHOUT FEVER  NAUSEA AND VOMITING THAT IS NOT CONTROLLED WITH YOUR NAUSEA MEDICATION  *UNUSUAL SHORTNESS OF BREATH  *UNUSUAL BRUISING OR BLEEDING  TENDERNESS IN MOUTH AND THROAT WITH OR WITHOUT PRESENCE OF ULCERS  *URINARY PROBLEMS  *BOWEL PROBLEMS  UNUSUAL RASH Items with * indicate a potential emergency and should be followed up as soon as possible.  Feel free to call the clinic you have any questions or concerns. The clinic phone number is [phone].    

## 2013-05-17 ENCOUNTER — Other Ambulatory Visit: Payer: BC Managed Care – PPO

## 2013-05-17 ENCOUNTER — Ambulatory Visit (HOSPITAL_BASED_OUTPATIENT_CLINIC_OR_DEPARTMENT_OTHER): Payer: BC Managed Care – PPO

## 2013-05-17 VITALS — BP 141/67 | HR 93 | Temp 98.6°F | Resp 20

## 2013-05-17 DIAGNOSIS — C9000 Multiple myeloma not having achieved remission: Secondary | ICD-10-CM

## 2013-05-17 DIAGNOSIS — Z5112 Encounter for antineoplastic immunotherapy: Secondary | ICD-10-CM

## 2013-05-17 MED ORDER — ONDANSETRON 8 MG/50ML IVPB (CHCC)
8.0000 mg | Freq: Once | INTRAVENOUS | Status: AC
  Administered 2013-05-17: 8 mg via INTRAVENOUS

## 2013-05-17 MED ORDER — ONDANSETRON 8 MG/NS 50 ML IVPB
INTRAVENOUS | Status: AC
Start: 2013-05-17 — End: 2013-05-17
  Filled 2013-05-17: qty 8

## 2013-05-17 MED ORDER — DEXAMETHASONE SODIUM PHOSPHATE 20 MG/5ML IJ SOLN
INTRAMUSCULAR | Status: AC
  Filled 2013-05-17: qty 5

## 2013-05-17 MED ORDER — HEPARIN SOD (PORK) LOCK FLUSH 100 UNIT/ML IV SOLN
500.0000 [IU] | Freq: Once | INTRAVENOUS | Status: AC | PRN
  Administered 2013-05-17: 500 [IU]
  Filled 2013-05-17: qty 5

## 2013-05-17 MED ORDER — SODIUM CHLORIDE 0.9 % IV SOLN
Freq: Once | INTRAVENOUS | Status: AC
  Administered 2013-05-17: 10:00:00 via INTRAVENOUS

## 2013-05-17 MED ORDER — INV-CARFILZOMIB CHEMO INJECTION 60 MG SWOG S1304
56.0000 mg/m2 | Freq: Once | INTRAVENOUS | Status: AC
  Administered 2013-05-17: 102 mg via INTRAVENOUS
  Filled 2013-05-17: qty 51

## 2013-05-17 MED ORDER — SODIUM CHLORIDE 0.9 % IJ SOLN
10.0000 mL | INTRAMUSCULAR | Status: DC | PRN
  Administered 2013-05-17: 10 mL
  Filled 2013-05-17: qty 10

## 2013-05-17 MED ORDER — DEXAMETHASONE SODIUM PHOSPHATE 20 MG/5ML IJ SOLN
20.0000 mg | Freq: Once | INTRAMUSCULAR | Status: AC
  Administered 2013-05-17: 20 mg via INTRAVENOUS

## 2013-05-17 NOTE — Patient Instructions (Signed)
[facility] Discharge Instructions for Patients Receiving Chemotherapy  Today you received the following chemotherapy agents: Kyprolis  To help prevent nausea and vomiting after your treatment, we encourage you to take your nausea medication as prescribed by your physician.   If you develop nausea and vomiting that is not controlled by your nausea medication, call the clinic.   BELOW ARE SYMPTOMS THAT SHOULD BE REPORTED IMMEDIATELY:  *FEVER GREATER THAN 100.5 F  *CHILLS WITH OR WITHOUT FEVER  NAUSEA AND VOMITING THAT IS NOT CONTROLLED WITH YOUR NAUSEA MEDICATION  *UNUSUAL SHORTNESS OF BREATH  *UNUSUAL BRUISING OR BLEEDING  TENDERNESS IN MOUTH AND THROAT WITH OR WITHOUT PRESENCE OF ULCERS  *URINARY PROBLEMS  *BOWEL PROBLEMS  UNUSUAL RASH Items with * indicate a potential emergency and should be followed up as soon as possible.  Feel free to call the clinic you have any questions or concerns. The clinic phone number is [phone].    

## 2013-05-24 ENCOUNTER — Encounter: Payer: Self-pay | Admitting: Oncology

## 2013-05-30 ENCOUNTER — Ambulatory Visit: Payer: BC Managed Care – PPO

## 2013-05-30 ENCOUNTER — Telehealth: Payer: Self-pay | Admitting: Oncology

## 2013-05-30 ENCOUNTER — Other Ambulatory Visit (HOSPITAL_BASED_OUTPATIENT_CLINIC_OR_DEPARTMENT_OTHER): Payer: BC Managed Care – PPO

## 2013-05-30 ENCOUNTER — Encounter: Payer: Self-pay | Admitting: Oncology

## 2013-05-30 ENCOUNTER — Encounter: Payer: Self-pay | Admitting: Physician Assistant

## 2013-05-30 ENCOUNTER — Encounter: Payer: Self-pay | Admitting: *Deleted

## 2013-05-30 ENCOUNTER — Ambulatory Visit (HOSPITAL_BASED_OUTPATIENT_CLINIC_OR_DEPARTMENT_OTHER): Payer: BC Managed Care – PPO

## 2013-05-30 ENCOUNTER — Ambulatory Visit (HOSPITAL_BASED_OUTPATIENT_CLINIC_OR_DEPARTMENT_OTHER): Payer: BC Managed Care – PPO | Admitting: Physician Assistant

## 2013-05-30 VITALS — BP 162/73 | HR 96 | Temp 98.2°F | Resp 18 | Ht 62.0 in | Wt 169.8 lb

## 2013-05-30 DIAGNOSIS — C9000 Multiple myeloma not having achieved remission: Secondary | ICD-10-CM

## 2013-05-30 DIAGNOSIS — D630 Anemia in neoplastic disease: Secondary | ICD-10-CM

## 2013-05-30 DIAGNOSIS — Z5112 Encounter for antineoplastic immunotherapy: Secondary | ICD-10-CM

## 2013-05-30 LAB — CBC WITH DIFFERENTIAL/PLATELET
BASO%: 0.7 % (ref 0.0–2.0)
Basophils Absolute: 0.1 10*3/uL (ref 0.0–0.1)
EOS%: 2.1 % (ref 0.0–7.0)
Eosinophils Absolute: 0.2 10*3/uL (ref 0.0–0.5)
HCT: 33 % — ABNORMAL LOW (ref 34.8–46.6)
HGB: 10.7 g/dL — ABNORMAL LOW (ref 11.6–15.9)
LYMPH%: 14.8 % (ref 14.0–49.7)
MCH: 29.5 pg (ref 25.1–34.0)
MCHC: 32.4 g/dL (ref 31.5–36.0)
MCV: 91 fL (ref 79.5–101.0)
MONO#: 1.1 10*3/uL — AB (ref 0.1–0.9)
MONO%: 12.5 % (ref 0.0–14.0)
NEUT#: 6.1 10*3/uL (ref 1.5–6.5)
NEUT%: 69.9 % (ref 38.4–76.8)
PLATELETS: 362 10*3/uL (ref 145–400)
RBC: 3.63 10*6/uL — AB (ref 3.70–5.45)
RDW: 18.6 % — ABNORMAL HIGH (ref 11.2–14.5)
WBC: 8.7 10*3/uL (ref 3.9–10.3)
lymph#: 1.3 10*3/uL (ref 0.9–3.3)

## 2013-05-30 LAB — COMPREHENSIVE METABOLIC PANEL (CC13)
ALK PHOS: 64 U/L (ref 40–150)
ALT: 15 U/L (ref 0–55)
AST: 22 U/L (ref 5–34)
Albumin: 3.1 g/dL — ABNORMAL LOW (ref 3.5–5.0)
Anion Gap: 10 mEq/L (ref 3–11)
BILIRUBIN TOTAL: 0.37 mg/dL (ref 0.20–1.20)
BUN: 9.9 mg/dL (ref 7.0–26.0)
CO2: 24 meq/L (ref 22–29)
Calcium: 9.7 mg/dL (ref 8.4–10.4)
Chloride: 108 mEq/L (ref 98–109)
Creatinine: 0.7 mg/dL (ref 0.6–1.1)
Glucose: 100 mg/dl (ref 70–140)
Potassium: 4.1 mEq/L (ref 3.5–5.1)
SODIUM: 142 meq/L (ref 136–145)
TOTAL PROTEIN: 7 g/dL (ref 6.4–8.3)

## 2013-05-30 LAB — MAGNESIUM (CC13): MAGNESIUM: 1.7 mg/dL (ref 1.5–2.5)

## 2013-05-30 LAB — LACTATE DEHYDROGENASE (CC13): LDH: 228 U/L (ref 125–245)

## 2013-05-30 LAB — PHOSPHORUS: Phosphorus: 5 mg/dL — ABNORMAL HIGH (ref 2.3–4.6)

## 2013-05-30 MED ORDER — DEXAMETHASONE SODIUM PHOSPHATE 20 MG/5ML IJ SOLN
20.0000 mg | Freq: Once | INTRAMUSCULAR | Status: AC
  Administered 2013-05-30: 20 mg via INTRAVENOUS

## 2013-05-30 MED ORDER — HEPARIN SOD (PORK) LOCK FLUSH 100 UNIT/ML IV SOLN
500.0000 [IU] | Freq: Once | INTRAVENOUS | Status: AC
  Administered 2013-05-30: 500 [IU] via INTRAVENOUS
  Filled 2013-05-30: qty 5

## 2013-05-30 MED ORDER — ZOLEDRONIC ACID 4 MG/100ML IV SOLN
4.0000 mg | Freq: Once | INTRAVENOUS | Status: AC
  Administered 2013-05-30: 4 mg via INTRAVENOUS
  Filled 2013-05-30: qty 100

## 2013-05-30 MED ORDER — SODIUM CHLORIDE 0.9 % IJ SOLN
10.0000 mL | INTRAMUSCULAR | Status: AC | PRN
  Administered 2013-05-30: 10 mL
  Filled 2013-05-30: qty 10

## 2013-05-30 MED ORDER — INV-CARFILZOMIB CHEMO INJECTION 60 MG SWOG S1304
56.0000 mg/m2 | Freq: Once | INTRAVENOUS | Status: AC
  Administered 2013-05-30: 102 mg via INTRAVENOUS
  Filled 2013-05-30: qty 51

## 2013-05-30 MED ORDER — SODIUM CHLORIDE 0.9 % IJ SOLN
10.0000 mL | INTRAMUSCULAR | Status: DC | PRN
  Administered 2013-05-30: 10 mL via INTRAVENOUS
  Filled 2013-05-30: qty 10

## 2013-05-30 MED ORDER — DEXAMETHASONE SODIUM PHOSPHATE 20 MG/5ML IJ SOLN
INTRAMUSCULAR | Status: AC
  Filled 2013-05-30: qty 5

## 2013-05-30 MED ORDER — ONDANSETRON 8 MG/NS 50 ML IVPB
INTRAVENOUS | Status: AC
  Filled 2013-05-30: qty 8

## 2013-05-30 MED ORDER — HEPARIN SOD (PORK) LOCK FLUSH 100 UNIT/ML IV SOLN
500.0000 [IU] | Freq: Once | INTRAVENOUS | Status: AC | PRN
  Administered 2013-05-30: 500 [IU]
  Filled 2013-05-30: qty 5

## 2013-05-30 MED ORDER — SODIUM CHLORIDE 0.9 % IV SOLN
Freq: Once | INTRAVENOUS | Status: AC
  Administered 2013-05-30: 15:00:00 via INTRAVENOUS

## 2013-05-30 MED ORDER — ONDANSETRON 8 MG/50ML IVPB (CHCC)
8.0000 mg | Freq: Once | INTRAVENOUS | Status: AC
  Administered 2013-05-30: 8 mg via INTRAVENOUS

## 2013-05-30 NOTE — Telephone Encounter (Signed)
added tx appts for march as requested per 2/16 pof (3/16, 3/17, 3/23, 3/24, 3/30, 3/31). lmonvm informing pt and asked that pt get new schedule when she comes in today.

## 2013-05-30 NOTE — Patient Instructions (Addendum)
[facility] Discharge Instructions for Patients Receiving Chemotherapy  Today you received the following chemotherapy agents Kyprolis/Zometa To help prevent nausea and vomiting after your treatment, we encourage you to take your nausea medication as prescribed.  If you develop nausea and vomiting that is not controlled by your nausea medication, call the clinic.   BELOW ARE SYMPTOMS THAT SHOULD BE REPORTED IMMEDIATELY:  *FEVER GREATER THAN 100.5 F  *CHILLS WITH OR WITHOUT FEVER  NAUSEA AND VOMITING THAT IS NOT CONTROLLED WITH YOUR NAUSEA MEDICATION  *UNUSUAL SHORTNESS OF BREATH  *UNUSUAL BRUISING OR BLEEDING  TENDERNESS IN MOUTH AND THROAT WITH OR WITHOUT PRESENCE OF ULCERS  *URINARY PROBLEMS  *BOWEL PROBLEMS  UNUSUAL RASH Items with * indicate a potential emergency and should be followed up as soon as possible.  Feel free to call the clinic you have any questions or concerns. The clinic phone number is [phone].

## 2013-05-30 NOTE — Addendum Note (Signed)
Addended by: Perlie Gold on: 05/30/2013 03:26 PM   Modules accepted: Orders

## 2013-05-30 NOTE — Progress Notes (Signed)
ID: Laura Hester   DOB: [birth year]  MR#: [id number]  SKA#:[id number]  PCP: Gara Kroner, MD GYN: Princess Bruins SU: OTHER MD: Precious Haws  CHIEF COMPLAINT:  Multiple Myeloma   HISTORY OF PRESENT ILLNESS: Laura Hester has a history of iron deficiency anemia secondary to menorrhagia. However, as this persisted despite near-total cessation of her menses and despite iron supplementation, she was referred to Dr. Teena Irani for GI evaluation. He found the patient's Hb to have decreased from 9.17 June 2011 to 8.3 07/29/2011. MCV was 95.9, ferritin 93.3, with a normal folate and negative ANA. Guaiacs were negative. Creatinine was 0.77.  t-transglutaminase IgA was normal at 2, but the total IgA was 6.9 g. Accordingly on 08/04/2011 an SPEP was obtained, showing an M-spike of 4.5 g, with a secondary M-spike of 0.4 g. Total protein was 10.0 with albumin 3.2, calcium 10.2. Bone survey 08/26/2011 showed multiple bone lesions consistent with myeloma and bone marrow biopsy 09/01/2011 confirmed the diagnosis with 75% myeloma cells in the marrow. Reeve' subsequent history is detailed below.  INTERVAL HISTORY: Laura Hester returns today for followup of her multiple myeloma accompanied by her husband [name]. Today is day 1 cycle 5 of her high-dose carfilzomib, which she is receiving according to SWOG 1304.  She also received zoledronic acid on a monthly basis, and is due for her next infusion today.  Overall, Laura Hester is feeling well, and has no new complaints today. Her energy level is good. She's very upbeat. She has been walking at least 3 days every week at the church, and this has also improved her energy level.  She's had no problems whatsoever with peripheral neuropathy, neither the upper nor the lower extremities.  REVIEW OF SYSTEMS: Laura Hester has had no fevers, chills, or night sweats. She denies any rashes or skin changes. She's had no abnormal bruising or bleeding. She denies any mouth ulcers, oral  sensitivity, or thrush. Her appetite is great, and she's had no nausea or change in bowel or bladder habits. She occasionally eats prunes for mild constipation, but this is stable and is not problematic. She's had no cough, increased shortness of breath, chest pain, or palpitations. She tells me she currently "has no pain at all", and denies any unusual myalgias, arthralgias, or bony pain. She has had no peripheral swelling. She also denies any abnormal headaches or dizziness.   A detailed review of systems is otherwise stable and noncontributory.    PAST MEDICAL HISTORY: Past Medical History  Diagnosis Date  . Fibrocystic breast disease   . Allergic rhinitis   . HTN (hypertension)     mild/ observation only  . Prediabetes   . SUI (stress urinary incontinence, female)   . Overweight (BMI 25.0-29.9)   . Multiple myeloma, stage 3 08/20/11 dx  . Anemia     resolved  . Cancer   . Arthritis     PAST SURGICAL HISTORY: Past Surgical History  Procedure Laterality Date  . Cholecystectomy  1995  . Tonsillectomy and adenoidectomy    . Carpal tunnel release      FAMILY HISTORY Family History  Problem Relation Age of Onset  . Heart disease Father   . Breast cancer Mother   . Heart disease Mother   . Diabetes Paternal Grandfather   . Hypertension Maternal Grandmother   . Hypertension Brother   The patient's parents are in their mid-33s.Her mother has a remote history of breast cancer. She has two brothers, one her twin; no sisters. There is no other  history of cancer in the family to her knowledge  GYNECOLOGIC HISTORY: Menarche age 39, first live birth age 31, [place] P59. Last period was January 2013, before that May 2012.  SOCIAL HISTORY: (Updated February 2015) She is a Forensic scientist and taught kindergarten until 2012. Her husband [name], graduated from [employer] and works in Technical brewer.  Daughter Laura Hester works for [employer], married in 2013. Daughter Laura Hester studies and works  in [place] and comes home every other week and The patient attends the [place]: Not in place  HEALTH MAINTENANCE: (Updated February 2015) History  Substance Use Topics  . Smoking status: Never Smoker   . Smokeless tobacco: Never Used  . Alcohol Use: No     Colonoscopy: 2009/[facility]  PAP: March 2014, Dr. Dellis Filbert  Bone density: never  Lipid panel: [place]/ "excellent"  Mammography: June of 2014    Allergies  Allergen Reactions  . Wellbutrin [Bupropion Hcl] Hives    Current Outpatient Prescriptions  Medication Sig Dispense Refill  . lidocaine-prilocaine (EMLA) cream Apply topically as needed.  30 g  3  . LORazepam (ATIVAN) 0.5 MG tablet Take 1 tablet (0.5 mg total) by mouth at bedtime as needed for anxiety.  30 tablet  0  . naproxen sodium (ANAPROX) 220 MG tablet Take 1 tablet (220 mg total) by mouth 2 (two) times daily with a meal.  60 tablet  6  . omeprazole (PRILOSEC) 20 MG capsule Take 1 capsule (20 mg total) by mouth daily.  30 capsule  12  . potassium chloride SA (K-DUR,KLOR-CON) 20 MEQ tablet Take 1 tablet (20 mEq total) by mouth daily.  90 tablet  1  . sulfamethoxazole-trimethoprim (BACTRIM DS) 800-160 MG per tablet Take 2 tablets by mouth 2 (two) times a week. Monday  AND THURSDAY  30 tablet  4  . valACYclovir (VALTREX) 500 MG tablet Take 1 tablet (500 mg total) by mouth daily.  30 tablet  12   No current facility-administered medications for this visit.   Facility-Administered Medications Ordered in Other Visits  Medication Dose Route Frequency Provider Last Rate Last Dose  . carfilzomib SWOG S1304 (KYPROLIS) 102 mg in dextrose 5 % 100 mL chemo infusion  56 mg/m2 (Treatment Plan Actual) Intravenous Once Shanzay Hepworth G Carroll Ranney, PA-C      . heparin lock flush 100 unit/mL  500 Units Intracatheter Once PRN Minervia Osso Milda Smart, PA-C      . sodium chloride 0.9 % injection 10 mL  10 mL Intracatheter PRN Shevelle Smither Milda Smart, PA-C      . Zoledronic Acid (ZOMETA)  4 mg IVPB  4 mg Intravenous Once Chauncey Cruel, MD 400 mL/hr at 05/30/13 1537 4 mg at 05/30/13 1537   OBJECTIVE: Middle-aged white woman who appears comfortable  in no acute distress Filed Vitals:   05/30/13 1411  BP: 162/73  Pulse: 96  Temp: 98.2 F (36.8 C)  Resp: 18     Body mass index is 31.05 kg/(m^2).     ECOG FS: 0    Zubrod Performance Scale: 0 Filed Weights   05/30/13 1411  Weight: 169 lb 12.8 oz (77.021 kg)   Physical Exam: HEENT:  Sclerae anicteric.  Oropharynx clear, pink, and moist. Neck supple, trachea midline. NODES:  No cervical, supraclavicular, or axillary lymphadenopathy palpated.  BREAST EXAM:  Deferred. LUNGS:  Clear to auscultation bilaterally with good excursion.  No wheezes or rhonchi HEART:  Regular rate and rhythm. No murmur appreciated ABDOMEN:  Soft,  nontender. No masses or organomegaly palpated.  Positive bowel sounds.  MSK:  No focal spinal tenderness to palpation. Good range of motion bilaterally in the upper and lower extremities EXTREMITIES:  No peripheral edema.   SKIN:  Benign with no visible rashes or skin changes. No excessive ecchymoses. No petechiae. No pallor. NEURO:  Nonfocal. Well oriented.  Positive affect.      LAB RESULTS:  Repeat SPEP and kappa/lambda results from 05/30/2013 are pending.     Lab Results  Component Value Date   WBC 8.7 05/30/2013   NEUTROABS 6.1 05/30/2013   HGB 10.7* 05/30/2013   HCT 33.0* 05/30/2013   MCV 91.0 05/30/2013   PLT 362 05/30/2013      Chemistry      Component Value Date/Time   NA 142 05/30/2013 1216   NA 143 12/01/2011 0836   K 4.1 05/30/2013 1216   K 4.0 12/01/2011 0836   CL 107 09/27/2012 0842   CL 105 12/01/2011 0836   CO2 24 05/30/2013 1216   CO2 29 12/01/2011 0836   BUN 9.9 05/30/2013 1216   BUN 12 12/01/2011 0836   CREATININE 0.7 05/30/2013 1216   CREATININE 0.52 12/01/2011 0836      Component Value Date/Time   CALCIUM 9.7 05/30/2013 1216   CALCIUM 8.9 12/01/2011 0836   ALKPHOS 64  05/30/2013 1216   ALKPHOS 110 12/01/2011 0836   AST 22 05/30/2013 1216   AST 15 12/01/2011 0836   ALT 15 05/30/2013 1216   ALT 16 12/01/2011 0836   BILITOT 0.37 05/30/2013 1216   BILITOT 0.3 12/01/2011 0836     05/30/2013 Magnesium:  1.7  Phosphorus:  5.0 LDH:   228   STUDIES:  No results found.   ASSESSMENT: 58 y.o.  [place] woman presenting with anemia, found to have an IgA >6g, with an M-spike of 4.5 g (and a second M-spike of 0.4g), urine IFE showing IgA-lambda and lambda light chains, creatinine 0.77, calcium 10.0, albumin 3.1, and beta-2-microglobulin 5.51; with bone survey showing multiple myeloma lesions and bone marrow biopsy 09/01/2011 showing 75% myeloma cells. 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  (2) on prophylactic coumadin, discontinued as of 12/13/2011.  (3) zolendronic acid every 28 days, 09/19/2011 to 01/05/2012 (five doses)  (4) s/p neupogen mobilization October 2013, with a collection of 4.26 x10^6 CD34 positive cells  (5) status post melphalan at 200 mg/M2 on 02/23/2012, followed by autologous stem cell transplant the following day.  (6) on 06/07/2012 started maintenance bortezomib sq at 1.3 mg/M2 Q2w, with bactrim and acyclovir prophylaxis, the plan being to contuinue this for 2 years as per the HOVON trial  (7) zolendronic acid, given monthly, resumed June 2014  (8) disease progression noted with a rise in the IgA lambda fraction September 2014  (9)  being treated according to the [facility] S1304 randomized to the high dose arm, and will be receiving carfilzomib  on days 1, 2, 8, 9, 15, and 16 of every 28 day cycle. Cycle 1 will be given at a reduced dose of 20 mg per meter square, with subsequent cycles given at 56 mg per meter square.   PLAN:  Lovena appears to be  doing well with regards to her multiple myeloma, and is tolerating this treatment regimen well. I am making no changes to  her current regimen. She'll receive the carfilzomib on days 1, 2, 8, 9, 15, and 16 of this 28 day cycle. I will see her again in four weeks on March 16 in anticipation of day 1 cycle 6.  Assuming that  she continues to show response and there is no progression, the plan is to stay on this study, which will go on for a total of 12 cycles.  She receive her next monthly infusion of zoledronic acid today as well. She is scheduled for routine dental assessment in April, but is having no current issues.   The patient's phosphorus level was elevated today at 5.0. This is also been elevated in the past, then normalized on its own. We will recheck the level in one week when she returns on February 23.    Both Silva Bandy and [name] voice their understanding and agreement with our plan as detailed above. They both know to call as always with any changes or problems prior to her next scheduled followup.   Nezzie Manera, PA-C     05/30/2013

## 2013-05-31 ENCOUNTER — Ambulatory Visit (HOSPITAL_BASED_OUTPATIENT_CLINIC_OR_DEPARTMENT_OTHER): Payer: BC Managed Care – PPO

## 2013-05-31 VITALS — BP 139/69 | HR 106 | Temp 97.3°F

## 2013-05-31 DIAGNOSIS — C9000 Multiple myeloma not having achieved remission: Secondary | ICD-10-CM

## 2013-05-31 DIAGNOSIS — Z5112 Encounter for antineoplastic immunotherapy: Secondary | ICD-10-CM

## 2013-05-31 MED ORDER — DEXAMETHASONE SODIUM PHOSPHATE 20 MG/5ML IJ SOLN
20.0000 mg | Freq: Once | INTRAMUSCULAR | Status: AC
  Administered 2013-05-31: 20 mg via INTRAVENOUS

## 2013-05-31 MED ORDER — ONDANSETRON 8 MG/50ML IVPB (CHCC)
8.0000 mg | Freq: Once | INTRAVENOUS | Status: AC
  Administered 2013-05-31: 8 mg via INTRAVENOUS

## 2013-05-31 MED ORDER — DEXTROSE 5 % IV SOLN
56.0000 mg/m2 | Freq: Once | INTRAVENOUS | Status: AC
  Administered 2013-05-31: 102 mg via INTRAVENOUS
  Filled 2013-05-31: qty 51

## 2013-05-31 MED ORDER — SODIUM CHLORIDE 0.9 % IV SOLN
Freq: Once | INTRAVENOUS | Status: AC
  Administered 2013-05-31: 14:00:00 via INTRAVENOUS

## 2013-05-31 MED ORDER — HEPARIN SOD (PORK) LOCK FLUSH 100 UNIT/ML IV SOLN
500.0000 [IU] | Freq: Once | INTRAVENOUS | Status: AC | PRN
  Administered 2013-05-31: 500 [IU]
  Filled 2013-05-31: qty 5

## 2013-05-31 MED ORDER — ONDANSETRON 8 MG/NS 50 ML IVPB
INTRAVENOUS | Status: AC
  Filled 2013-05-31: qty 8

## 2013-05-31 MED ORDER — DEXAMETHASONE SODIUM PHOSPHATE 20 MG/5ML IJ SOLN
INTRAMUSCULAR | Status: AC
  Filled 2013-05-31: qty 5

## 2013-05-31 MED ORDER — SODIUM CHLORIDE 0.9 % IJ SOLN
10.0000 mL | INTRAMUSCULAR | Status: DC | PRN
  Administered 2013-05-31: 10 mL
  Filled 2013-05-31: qty 10

## 2013-05-31 NOTE — Patient Instructions (Addendum)
[facility] Discharge Instructions for Patients Receiving Chemotherapy  Today you received the following chemotherapy agent: Kyprolis  To help prevent nausea and vomiting after your treatment, we encourage you to take your nausea medication as directed:  Ativan 0.5 mg as needed   If you develop nausea and vomiting that is not controlled by your nausea medication, call the clinic.   BELOW ARE SYMPTOMS THAT SHOULD BE REPORTED IMMEDIATELY:  *FEVER GREATER THAN 100.5 F  *CHILLS WITH OR WITHOUT FEVER  NAUSEA AND VOMITING THAT IS NOT CONTROLLED WITH YOUR NAUSEA MEDICATION  *UNUSUAL SHORTNESS OF BREATH  *UNUSUAL BRUISING OR BLEEDING  TENDERNESS IN MOUTH AND THROAT WITH OR WITHOUT PRESENCE OF ULCERS  *URINARY PROBLEMS  *BOWEL PROBLEMS  UNUSUAL RASH Items with * indicate a potential emergency and should be followed up as soon as possible.  Feel free to call the clinic should you have any questions or concerns. The clinic phone number is [phone].  It has been a pleasure to serve you today!

## 2013-05-31 NOTE — Progress Notes (Signed)
Zofran/Decadron completed at 1420.

## 2013-06-01 ENCOUNTER — Telehealth: Payer: Self-pay | Admitting: Oncology

## 2013-06-01 LAB — UIFE/LIGHT CHAINS/TP QN, 24-HR UR
ALBUMIN, U: DETECTED
Alpha 1, Urine: DETECTED — AB
Alpha 2, Urine: DETECTED — AB
Beta, Urine: DETECTED — AB
FREE KAPPA LT CHAINS, UR: 0.58 mg/dL (ref 0.14–2.42)
FREE LAMBDA EXCRETION/DAY: 12.96 mg/d
FREE LAMBDA LT CHAINS, UR: 0.54 mg/dL (ref 0.02–0.67)
Free Kappa/Lambda Ratio: 1.07 ratio — ABNORMAL LOW (ref 2.04–10.37)
Free Lt Chn Excr Rate: 13.92 mg/d
GAMMA UR: DETECTED — AB
Time: 24 hours
Total Protein, Urine-Ur/day: 689 mg/d — ABNORMAL HIGH (ref 10–140)
Total Protein, Urine: 28.7 mg/dL
VOLUME, URINE-UPE24: 2400 mL

## 2013-06-01 LAB — UPEP/TP, 24-HR URINE
Albumin: 68.3 %
Alpha-1-Globulin, U: 17.9 %
Alpha-2-Globulin, U: 7.1 %
Beta Globulin, U: 4.5 %
COLLECTION INTERVAL: 24 h
Gamma Globulin, U: 2.2 %
TOTAL PROTEIN, URINE: 40 mg/dL
Total Protein, Urine/Day: 960 mg/d — ABNORMAL HIGH (ref 50–100)
Total Volume, Urine: 2400 mL

## 2013-06-01 LAB — SPEP & IFE WITH QIG
ALBUMIN ELP: 48.1 % — AB (ref 55.8–66.1)
ALPHA-1-GLOBULIN: 5.7 % — AB (ref 2.9–4.9)
Alpha-2-Globulin: 11.1 % (ref 7.1–11.8)
Beta 2: 19.6 % — ABNORMAL HIGH (ref 3.2–6.5)
Beta Globulin: 8.5 % — ABNORMAL HIGH (ref 4.7–7.2)
Gamma Globulin: 7 % — ABNORMAL LOW (ref 11.1–18.8)
IgA: 1530 mg/dL — ABNORMAL HIGH (ref 69–380)
IgG (Immunoglobin G), Serum: 202 mg/dL — ABNORMAL LOW (ref 690–1700)
M-Spike, %: 0.47 g/dL
Total Protein, Serum Electrophoresis: 6.8 g/dL (ref 6.0–8.3)

## 2013-06-01 LAB — IGE: IgE (Immunoglobulin E), Serum: 13.4 IU/mL (ref 0.0–180.0)

## 2013-06-01 NOTE — Progress Notes (Signed)
05/30/13 @ 2:00pm, SWOG B3419, Cycle 5, Day 1:  Mrw. Krogh into the [facility] for labs, to see Micah Flesher, PA and to receive treatment with carfilzomib.  Her CBC and CMET were appropriate to proceed with treatment, and her restaging labs will be available later this week.  She is having no complaints.   Spoke with Randolm Idol, RN in the infusion area and provided her with a sign for the pump with details of treatment, emphasizing the duration of 30 minutes.  05/31/13 @ 1:30 pm, C5 D2:  Mrs. Schwark into the [facility] for carfilzomib infusion.  Provided Merceda Elks, RN a sign for the pump with details of the treatment requirements, emphasizing the 30 minute duration for carfilzomib.  06/06/13 @ 2: 30 pm, C5 D8:  Mrs. Pinkney into the [facility] for carfilzomib infusion.  Provided Jaci Carrel, RN with a sign for the pump with details of treatment requirements.  Mrs. Kirshenbaum has no new complaints.  06/07/13 @ 1:30 pm, C5 D9:  Mrs. Nida into the [facility] for high dose carfilzomib infusion.  Provided Ann Lions, RN with a sign for the pump with details of treatment.  06/13/13 @ 3:00 pm, C5, D15:  Mrs. Towe into the [facility] for high dose carfilzomib infusion. Provided Cira Rue, RN with a sign for the pump with details of treatment.  06/14/13 @ 3:00 pm, C5, D16:  Mrs. Spaugh into the [facility] for high dose carfilzomib infusion.  Provided Rudene Anda, RN with a sign for the pump with details of the treatment.  Provided patient with 24 hour urine collection container for next cycle.

## 2013-06-02 LAB — C-REACTIVE PROTEIN: CRP: 1.4 mg/dL — AB (ref <0.60)

## 2013-06-02 LAB — KAPPA/LAMBDA LIGHT CHAINS
Kappa free light chain: 0.03 mg/dL — ABNORMAL LOW (ref 0.33–1.94)
Kappa:Lambda Ratio: 0 — ABNORMAL LOW (ref 0.26–1.65)
Lambda Free Lght Chn: 13.6 mg/dL — ABNORMAL HIGH (ref 0.57–2.63)

## 2013-06-02 LAB — IGD: IgD: <2 mg/L (ref <179)

## 2013-06-06 ENCOUNTER — Ambulatory Visit (HOSPITAL_BASED_OUTPATIENT_CLINIC_OR_DEPARTMENT_OTHER): Payer: BC Managed Care – PPO

## 2013-06-06 ENCOUNTER — Other Ambulatory Visit: Payer: Self-pay | Admitting: Oncology

## 2013-06-06 ENCOUNTER — Other Ambulatory Visit: Payer: BC Managed Care – PPO

## 2013-06-06 VITALS — BP 164/65 | HR 95 | Temp 98.4°F | Resp 18

## 2013-06-06 DIAGNOSIS — Z5112 Encounter for antineoplastic immunotherapy: Secondary | ICD-10-CM

## 2013-06-06 DIAGNOSIS — C9000 Multiple myeloma not having achieved remission: Secondary | ICD-10-CM

## 2013-06-06 LAB — PHOSPHORUS: Phosphorus: 4 mg/dL (ref 2.3–4.6)

## 2013-06-06 MED ORDER — DEXAMETHASONE SODIUM PHOSPHATE 20 MG/5ML IJ SOLN
INTRAMUSCULAR | Status: AC
  Filled 2013-06-06: qty 5

## 2013-06-06 MED ORDER — ONDANSETRON 8 MG/50ML IVPB (CHCC)
8.0000 mg | Freq: Once | INTRAVENOUS | Status: AC
  Administered 2013-06-06: 8 mg via INTRAVENOUS

## 2013-06-06 MED ORDER — SODIUM CHLORIDE 0.9 % IJ SOLN
10.0000 mL | INTRAMUSCULAR | Status: DC | PRN
  Administered 2013-06-06: 10 mL
  Filled 2013-06-06: qty 10

## 2013-06-06 MED ORDER — ONDANSETRON 8 MG/NS 50 ML IVPB
INTRAVENOUS | Status: AC
  Filled 2013-06-06: qty 8

## 2013-06-06 MED ORDER — HEPARIN SOD (PORK) LOCK FLUSH 100 UNIT/ML IV SOLN
500.0000 [IU] | Freq: Once | INTRAVENOUS | Status: AC | PRN
  Administered 2013-06-06: 500 [IU]
  Filled 2013-06-06: qty 5

## 2013-06-06 MED ORDER — DEXAMETHASONE SODIUM PHOSPHATE 20 MG/5ML IJ SOLN
20.0000 mg | Freq: Once | INTRAMUSCULAR | Status: AC
  Administered 2013-06-06: 20 mg via INTRAVENOUS

## 2013-06-06 MED ORDER — INV-CARFILZOMIB CHEMO INJECTION 60 MG SWOG S1304
56.0000 mg/m2 | Freq: Once | INTRAVENOUS | Status: AC
  Administered 2013-06-06: 102 mg via INTRAVENOUS
  Filled 2013-06-06: qty 51

## 2013-06-06 MED ORDER — SODIUM CHLORIDE 0.9 % IV SOLN
Freq: Once | INTRAVENOUS | Status: AC
  Administered 2013-06-06: 15:00:00 via INTRAVENOUS

## 2013-06-06 NOTE — Patient Instructions (Signed)
[facility] Discharge Instructions for Patients Receiving Chemotherapy  Today you received the following chemotherapy agents Kyprolis.  To help prevent nausea and vomiting after your treatment, we encourage you to take your nausea medication.   If you develop nausea and vomiting that is not controlled by your nausea medication, call the clinic.   BELOW ARE SYMPTOMS THAT SHOULD BE REPORTED IMMEDIATELY:  *FEVER GREATER THAN 100.5 F  *CHILLS WITH OR WITHOUT FEVER  NAUSEA AND VOMITING THAT IS NOT CONTROLLED WITH YOUR NAUSEA MEDICATION  *UNUSUAL SHORTNESS OF BREATH  *UNUSUAL BRUISING OR BLEEDING  TENDERNESS IN MOUTH AND THROAT WITH OR WITHOUT PRESENCE OF ULCERS  *URINARY PROBLEMS  *BOWEL PROBLEMS  UNUSUAL RASH Items with * indicate a potential emergency and should be followed up as soon as possible.  Feel free to call the clinic you have any questions or concerns. The clinic phone number is [phone].    

## 2013-06-07 ENCOUNTER — Ambulatory Visit (HOSPITAL_BASED_OUTPATIENT_CLINIC_OR_DEPARTMENT_OTHER): Payer: BC Managed Care – PPO

## 2013-06-07 VITALS — BP 138/57 | HR 99 | Temp 97.1°F | Resp 20

## 2013-06-07 DIAGNOSIS — C9000 Multiple myeloma not having achieved remission: Secondary | ICD-10-CM

## 2013-06-07 DIAGNOSIS — Z5112 Encounter for antineoplastic immunotherapy: Secondary | ICD-10-CM

## 2013-06-07 MED ORDER — HEPARIN SOD (PORK) LOCK FLUSH 100 UNIT/ML IV SOLN
500.0000 [IU] | Freq: Once | INTRAVENOUS | Status: AC | PRN
  Administered 2013-06-07: 500 [IU]
  Filled 2013-06-07: qty 5

## 2013-06-07 MED ORDER — SODIUM CHLORIDE 0.9 % IV SOLN
Freq: Once | INTRAVENOUS | Status: AC
  Administered 2013-06-07: 14:00:00 via INTRAVENOUS

## 2013-06-07 MED ORDER — ONDANSETRON 8 MG/50ML IVPB (CHCC)
8.0000 mg | Freq: Once | INTRAVENOUS | Status: AC
  Administered 2013-06-07: 8 mg via INTRAVENOUS

## 2013-06-07 MED ORDER — INV-CARFILZOMIB CHEMO INJECTION 60 MG SWOG S1304
56.0000 mg/m2 | Freq: Once | INTRAVENOUS | Status: AC
  Administered 2013-06-07: 102 mg via INTRAVENOUS
  Filled 2013-06-07: qty 51

## 2013-06-07 MED ORDER — SODIUM CHLORIDE 0.9 % IJ SOLN
10.0000 mL | INTRAMUSCULAR | Status: DC | PRN
  Administered 2013-06-07: 10 mL
  Filled 2013-06-07: qty 10

## 2013-06-07 MED ORDER — DEXAMETHASONE SODIUM PHOSPHATE 20 MG/5ML IJ SOLN
20.0000 mg | Freq: Once | INTRAMUSCULAR | Status: AC
  Administered 2013-06-07: 20 mg via INTRAVENOUS

## 2013-06-07 MED ORDER — ONDANSETRON 8 MG/NS 50 ML IVPB
INTRAVENOUS | Status: AC
  Filled 2013-06-07: qty 8

## 2013-06-07 MED ORDER — DEXAMETHASONE SODIUM PHOSPHATE 20 MG/5ML IJ SOLN
INTRAMUSCULAR | Status: AC
  Filled 2013-06-07: qty 5

## 2013-06-07 NOTE — Patient Instructions (Signed)
[facility] Discharge Instructions for Patients Receiving Chemotherapy  Today you received the following chemotherapy agents: Kyprolis  To help prevent nausea and vomiting after your treatment, we encourage you to take your nausea medication as prescribed by your physician.   If you develop nausea and vomiting that is not controlled by your nausea medication, call the clinic.   BELOW ARE SYMPTOMS THAT SHOULD BE REPORTED IMMEDIATELY:  *FEVER GREATER THAN 100.5 F  *CHILLS WITH OR WITHOUT FEVER  NAUSEA AND VOMITING THAT IS NOT CONTROLLED WITH YOUR NAUSEA MEDICATION  *UNUSUAL SHORTNESS OF BREATH  *UNUSUAL BRUISING OR BLEEDING  TENDERNESS IN MOUTH AND THROAT WITH OR WITHOUT PRESENCE OF ULCERS  *URINARY PROBLEMS  *BOWEL PROBLEMS  UNUSUAL RASH Items with * indicate a potential emergency and should be followed up as soon as possible.  Feel free to call the clinic you have any questions or concerns. The clinic phone number is [phone].    

## 2013-06-13 ENCOUNTER — Ambulatory Visit (HOSPITAL_BASED_OUTPATIENT_CLINIC_OR_DEPARTMENT_OTHER): Payer: BC Managed Care – PPO

## 2013-06-13 VITALS — BP 153/61 | HR 101 | Temp 98.8°F

## 2013-06-13 DIAGNOSIS — Z5112 Encounter for antineoplastic immunotherapy: Secondary | ICD-10-CM

## 2013-06-13 DIAGNOSIS — C9000 Multiple myeloma not having achieved remission: Secondary | ICD-10-CM

## 2013-06-13 MED ORDER — SODIUM CHLORIDE 0.9 % IV SOLN
Freq: Once | INTRAVENOUS | Status: AC
  Administered 2013-06-13: 15:00:00 via INTRAVENOUS

## 2013-06-13 MED ORDER — INV-CARFILZOMIB CHEMO INJECTION 60 MG SWOG S1304
56.0000 mg/m2 | Freq: Once | INTRAVENOUS | Status: AC
  Administered 2013-06-13: 102 mg via INTRAVENOUS
  Filled 2013-06-13: qty 51

## 2013-06-13 MED ORDER — SODIUM CHLORIDE 0.9 % IJ SOLN
10.0000 mL | INTRAMUSCULAR | Status: DC | PRN
  Administered 2013-06-13: 10 mL
  Filled 2013-06-13: qty 10

## 2013-06-13 MED ORDER — DEXAMETHASONE SODIUM PHOSPHATE 20 MG/5ML IJ SOLN
20.0000 mg | Freq: Once | INTRAMUSCULAR | Status: AC
  Administered 2013-06-13: 20 mg via INTRAVENOUS

## 2013-06-13 MED ORDER — DEXAMETHASONE SODIUM PHOSPHATE 20 MG/5ML IJ SOLN
INTRAMUSCULAR | Status: AC
  Filled 2013-06-13: qty 5

## 2013-06-13 MED ORDER — ONDANSETRON 8 MG/50ML IVPB (CHCC)
8.0000 mg | Freq: Once | INTRAVENOUS | Status: AC
  Administered 2013-06-13: 8 mg via INTRAVENOUS

## 2013-06-13 MED ORDER — ONDANSETRON 8 MG/NS 50 ML IVPB
INTRAVENOUS | Status: AC
  Filled 2013-06-13: qty 8

## 2013-06-13 MED ORDER — HEPARIN SOD (PORK) LOCK FLUSH 100 UNIT/ML IV SOLN
500.0000 [IU] | Freq: Once | INTRAVENOUS | Status: AC | PRN
  Administered 2013-06-13: 500 [IU]
  Filled 2013-06-13: qty 5

## 2013-06-13 NOTE — Patient Instructions (Signed)
[facility] Discharge Instructions for Patients Receiving Chemotherapy  Today you received the following chemotherapy agents Kyprolis To help prevent nausea and vomiting after your treatment, we encourage you to take your nausea medication as prescribed.  If you develop nausea and vomiting that is not controlled by your nausea medication, call the clinic.   BELOW ARE SYMPTOMS THAT SHOULD BE REPORTED IMMEDIATELY:  *FEVER GREATER THAN 100.5 F  *CHILLS WITH OR WITHOUT FEVER  NAUSEA AND VOMITING THAT IS NOT CONTROLLED WITH YOUR NAUSEA MEDICATION  *UNUSUAL SHORTNESS OF BREATH  *UNUSUAL BRUISING OR BLEEDING  TENDERNESS IN MOUTH AND THROAT WITH OR WITHOUT PRESENCE OF ULCERS  *URINARY PROBLEMS  *BOWEL PROBLEMS  UNUSUAL RASH Items with * indicate a potential emergency and should be followed up as soon as possible.  Feel free to call the clinic you have any questions or concerns. The clinic phone number is [phone].    

## 2013-06-14 ENCOUNTER — Ambulatory Visit (HOSPITAL_BASED_OUTPATIENT_CLINIC_OR_DEPARTMENT_OTHER): Payer: BC Managed Care – PPO

## 2013-06-14 VITALS — BP 135/66 | HR 101 | Temp 97.5°F

## 2013-06-14 DIAGNOSIS — Z5112 Encounter for antineoplastic immunotherapy: Secondary | ICD-10-CM

## 2013-06-14 DIAGNOSIS — C9000 Multiple myeloma not having achieved remission: Secondary | ICD-10-CM

## 2013-06-14 MED ORDER — ONDANSETRON 8 MG/NS 50 ML IVPB
INTRAVENOUS | Status: AC
  Filled 2013-06-14: qty 8

## 2013-06-14 MED ORDER — SODIUM CHLORIDE 0.9 % IV SOLN
Freq: Once | INTRAVENOUS | Status: AC
  Administered 2013-06-14: 15:00:00 via INTRAVENOUS

## 2013-06-14 MED ORDER — DEXTROSE 5 % IV SOLN
56.0000 mg/m2 | Freq: Once | INTRAVENOUS | Status: AC
  Administered 2013-06-14: 102 mg via INTRAVENOUS
  Filled 2013-06-14: qty 51

## 2013-06-14 MED ORDER — SODIUM CHLORIDE 0.9 % IJ SOLN
10.0000 mL | INTRAMUSCULAR | Status: DC | PRN
  Administered 2013-06-14: 10 mL
  Filled 2013-06-14: qty 10

## 2013-06-14 MED ORDER — DEXAMETHASONE SODIUM PHOSPHATE 20 MG/5ML IJ SOLN
INTRAMUSCULAR | Status: AC
  Filled 2013-06-14: qty 5

## 2013-06-14 MED ORDER — ONDANSETRON 8 MG/50ML IVPB (CHCC)
8.0000 mg | Freq: Once | INTRAVENOUS | Status: AC
  Administered 2013-06-14: 8 mg via INTRAVENOUS

## 2013-06-14 MED ORDER — HEPARIN SOD (PORK) LOCK FLUSH 100 UNIT/ML IV SOLN
500.0000 [IU] | Freq: Once | INTRAVENOUS | Status: AC | PRN
  Administered 2013-06-14: 500 [IU]
  Filled 2013-06-14: qty 5

## 2013-06-14 MED ORDER — DEXAMETHASONE SODIUM PHOSPHATE 20 MG/5ML IJ SOLN
20.0000 mg | Freq: Once | INTRAMUSCULAR | Status: AC
  Administered 2013-06-14: 20 mg via INTRAVENOUS

## 2013-06-14 NOTE — Patient Instructions (Signed)
[facility] Discharge Instructions for Patients Receiving Chemotherapy  Today you received the following chemotherapy agents kyprolis  To help prevent nausea and vomiting after your treatment, we encourage you to take your nausea medication as needed   If you develop nausea and vomiting that is not controlled by your nausea medication, call the clinic.   BELOW ARE SYMPTOMS THAT SHOULD BE REPORTED IMMEDIATELY:  *FEVER GREATER THAN 100.5 F  *CHILLS WITH OR WITHOUT FEVER  NAUSEA AND VOMITING THAT IS NOT CONTROLLED WITH YOUR NAUSEA MEDICATION  *UNUSUAL SHORTNESS OF BREATH  *UNUSUAL BRUISING OR BLEEDING  TENDERNESS IN MOUTH AND THROAT WITH OR WITHOUT PRESENCE OF ULCERS  *URINARY PROBLEMS  *BOWEL PROBLEMS  UNUSUAL RASH Items with * indicate a potential emergency and should be followed up as soon as possible.  Feel free to call the clinic you have any questions or concerns. The clinic phone number is [phone].    

## 2013-06-22 ENCOUNTER — Telehealth: Payer: Self-pay | Admitting: *Deleted

## 2013-06-22 MED ORDER — AZITHROMYCIN 250 MG PO TABS: ORAL_TABLET | ORAL | Status: DC

## 2013-06-22 NOTE — Telephone Encounter (Signed)
Rec'd message from patient that she has "caught a cold from husband Laura Hester", hoarseness, cough and some congestion. Temp x 1 yesterday of 102.0, with relief from Aleve, no higher than 99 at this time. Per Dr Jana Hakim, we will start patient on a Zpak.

## 2013-06-27 ENCOUNTER — Other Ambulatory Visit (HOSPITAL_BASED_OUTPATIENT_CLINIC_OR_DEPARTMENT_OTHER): Payer: BC Managed Care – PPO | Admitting: Oncology

## 2013-06-27 ENCOUNTER — Other Ambulatory Visit: Payer: Self-pay | Admitting: *Deleted

## 2013-06-27 ENCOUNTER — Ambulatory Visit (HOSPITAL_BASED_OUTPATIENT_CLINIC_OR_DEPARTMENT_OTHER): Payer: BC Managed Care – PPO | Admitting: Physician Assistant

## 2013-06-27 ENCOUNTER — Encounter: Payer: Self-pay | Admitting: Oncology

## 2013-06-27 ENCOUNTER — Ambulatory Visit (HOSPITAL_COMMUNITY)
Admission: RE | Admit: 2013-06-27 | Discharge: 2013-06-27 | Disposition: A | Discharge status: Home / Self Care | Payer: BC Managed Care – PPO | Source: Ambulatory Visit | Attending: Oncology | Admitting: Oncology

## 2013-06-27 ENCOUNTER — Other Ambulatory Visit: Payer: BC Managed Care – PPO

## 2013-06-27 ENCOUNTER — Ambulatory Visit (HOSPITAL_BASED_OUTPATIENT_CLINIC_OR_DEPARTMENT_OTHER): Payer: BC Managed Care – PPO

## 2013-06-27 ENCOUNTER — Encounter: Payer: Self-pay | Admitting: *Deleted

## 2013-06-27 ENCOUNTER — Encounter: Payer: Self-pay | Admitting: Physician Assistant

## 2013-06-27 VITALS — BP 153/75 | HR 96 | Temp 98.9°F | Resp 18 | Ht 62.0 in | Wt 167.3 lb

## 2013-06-27 DIAGNOSIS — C9000 Multiple myeloma not having achieved remission: Secondary | ICD-10-CM

## 2013-06-27 DIAGNOSIS — Z5112 Encounter for antineoplastic immunotherapy: Secondary | ICD-10-CM

## 2013-06-27 DIAGNOSIS — I498 Other specified cardiac arrhythmias: Secondary | ICD-10-CM | POA: Insufficient documentation

## 2013-06-27 DIAGNOSIS — J069 Acute upper respiratory infection, unspecified: Secondary | ICD-10-CM | POA: Insufficient documentation

## 2013-06-27 DIAGNOSIS — D649 Anemia, unspecified: Secondary | ICD-10-CM

## 2013-06-27 DIAGNOSIS — Z95828 Presence of other vascular implants and grafts: Secondary | ICD-10-CM

## 2013-06-27 DIAGNOSIS — D630 Anemia in neoplastic disease: Secondary | ICD-10-CM

## 2013-06-27 DIAGNOSIS — M545 Low back pain, unspecified: Secondary | ICD-10-CM | POA: Insufficient documentation

## 2013-06-27 DIAGNOSIS — J45909 Unspecified asthma, uncomplicated: Secondary | ICD-10-CM

## 2013-06-27 LAB — CBC WITH DIFFERENTIAL/PLATELET
BASO%: 0.6 % (ref 0.0–2.0)
Basophils Absolute: 0.1 10*3/uL (ref 0.0–0.1)
EOS%: 1.4 % (ref 0.0–7.0)
Eosinophils Absolute: 0.1 10*3/uL (ref 0.0–0.5)
HCT: 30.7 % — ABNORMAL LOW (ref 34.8–46.6)
HGB: 9.5 g/dL — ABNORMAL LOW (ref 11.6–15.9)
LYMPH#: 1 10*3/uL (ref 0.9–3.3)
LYMPH%: 12.1 % — ABNORMAL LOW (ref 14.0–49.7)
MCH: 29.1 pg (ref 25.1–34.0)
MCHC: 30.9 g/dL — AB (ref 31.5–36.0)
MCV: 94.2 fL (ref 79.5–101.0)
MONO#: 1.1 10*3/uL — ABNORMAL HIGH (ref 0.1–0.9)
MONO%: 12.7 % (ref 0.0–14.0)
NEUT#: 6.1 10*3/uL (ref 1.5–6.5)
NEUT%: 73.2 % (ref 38.4–76.8)
Platelets: 393 10*3/uL (ref 145–400)
RBC: 3.26 10*6/uL — ABNORMAL LOW (ref 3.70–5.45)
RDW: 16.4 % — ABNORMAL HIGH (ref 11.2–14.5)
WBC: 8.4 10*3/uL (ref 3.9–10.3)
nRBC: 1 % — ABNORMAL HIGH (ref 0–0)

## 2013-06-27 LAB — COMPREHENSIVE METABOLIC PANEL (CC13)
ALBUMIN: 2.5 g/dL — AB (ref 3.5–5.0)
ALK PHOS: 57 U/L (ref 40–150)
ALT: 16 U/L (ref 0–55)
AST: 26 U/L (ref 5–34)
Anion Gap: 13 mEq/L — ABNORMAL HIGH (ref 3–11)
BILIRUBIN TOTAL: 0.33 mg/dL (ref 0.20–1.20)
BUN: 6.8 mg/dL — AB (ref 7.0–26.0)
CO2: 24 mEq/L (ref 22–29)
CREATININE: 0.7 mg/dL (ref 0.6–1.1)
Calcium: 9.3 mg/dL (ref 8.4–10.4)
Chloride: 109 mEq/L (ref 98–109)
GLUCOSE: 131 mg/dL (ref 70–140)
Potassium: 3.4 mEq/L — ABNORMAL LOW (ref 3.5–5.1)
Sodium: 145 mEq/L (ref 136–145)
Total Protein: 7.5 g/dL (ref 6.4–8.3)

## 2013-06-27 LAB — LACTATE DEHYDROGENASE (CC13): LDH: 240 U/L (ref 125–245)

## 2013-06-27 LAB — MAGNESIUM (CC13): Magnesium: 1.9 mg/dl (ref 1.5–2.5)

## 2013-06-27 LAB — PHOSPHORUS: PHOSPHORUS: 4.4 mg/dL (ref 2.3–4.6)

## 2013-06-27 MED ORDER — SODIUM CHLORIDE 0.9 % IJ SOLN
10.0000 mL | INTRAMUSCULAR | Status: DC | PRN
  Administered 2013-06-27: 10 mL via INTRAVENOUS
  Filled 2013-06-27: qty 10

## 2013-06-27 MED ORDER — DEXAMETHASONE SODIUM PHOSPHATE 20 MG/5ML IJ SOLN
INTRAMUSCULAR | Status: AC
  Filled 2013-06-27: qty 5

## 2013-06-27 MED ORDER — DEXTROSE 5 % IV SOLN
56.0000 mg/m2 | Freq: Once | INTRAVENOUS | Status: AC
  Administered 2013-06-27: 102 mg via INTRAVENOUS
  Filled 2013-06-27: qty 51

## 2013-06-27 MED ORDER — SODIUM CHLORIDE 0.9 % IJ SOLN
10.0000 mL | INTRAMUSCULAR | Status: DC | PRN
  Administered 2013-06-27: 10 mL
  Filled 2013-06-27: qty 10

## 2013-06-27 MED ORDER — ZOLEDRONIC ACID 4 MG/100ML IV SOLN
4.0000 mg | Freq: Once | INTRAVENOUS | Status: AC
  Administered 2013-06-27: 4 mg via INTRAVENOUS
  Filled 2013-06-27: qty 100

## 2013-06-27 MED ORDER — DEXAMETHASONE SODIUM PHOSPHATE 20 MG/5ML IJ SOLN
20.0000 mg | Freq: Once | INTRAMUSCULAR | Status: AC
  Administered 2013-06-27: 20 mg via INTRAVENOUS

## 2013-06-27 MED ORDER — SODIUM CHLORIDE 0.9 % IV SOLN
Freq: Once | INTRAVENOUS | Status: AC
  Administered 2013-06-27: 12:00:00 via INTRAVENOUS

## 2013-06-27 MED ORDER — ONDANSETRON 8 MG/NS 50 ML IVPB
INTRAVENOUS | Status: AC
  Filled 2013-06-27: qty 8

## 2013-06-27 MED ORDER — HEPARIN SOD (PORK) LOCK FLUSH 100 UNIT/ML IV SOLN
500.0000 [IU] | Freq: Once | INTRAVENOUS | Status: AC | PRN
  Administered 2013-06-27: 500 [IU]
  Filled 2013-06-27: qty 5

## 2013-06-27 MED ORDER — ONDANSETRON 8 MG/50ML IVPB (CHCC)
8.0000 mg | Freq: Once | INTRAVENOUS | Status: AC
  Administered 2013-06-27: 8 mg via INTRAVENOUS

## 2013-06-27 MED ORDER — HEPARIN SOD (PORK) LOCK FLUSH 100 UNIT/ML IV SOLN
500.0000 [IU] | Freq: Once | INTRAVENOUS | Status: AC
  Administered 2013-06-27: 500 [IU] via INTRAVENOUS
  Filled 2013-06-27: qty 5

## 2013-06-27 NOTE — Patient Instructions (Signed)

## 2013-06-27 NOTE — Patient Instructions (Signed)
[facility]charge Instructions for Patients Receiving Chemotherapy  Today you received the following chemotherapy agents Kyprolis To help prevent nausea and vomiting after your treatment, we encourage you to take your nausea medication as prescribed.  If you develop nausea and vomiting that is not controlled by your nausea medication, call the clinic.   BELOW ARE SYMPTOMS THAT SHOULD BE REPORTED IMMEDIATELY:  *FEVER GREATER THAN 100.5 F  *CHILLS WITH OR WITHOUT FEVER  NAUSEA AND VOMITING THAT IS NOT CONTROLLED WITH YOUR NAUSEA MEDICATION  *UNUSUAL SHORTNESS OF BREATH  *UNUSUAL BRUISING OR BLEEDING  TENDERNESS IN MOUTH AND THROAT WITH OR WITHOUT PRESENCE OF ULCERS  *URINARY PROBLEMS  *BOWEL PROBLEMS  UNUSUAL RASH Items with * indicate a potential emergency and should be followed up as soon as possible.  Feel free to call the clinic you have any questions or concerns. The clinic phone number is [phone].    

## 2013-06-27 NOTE — Progress Notes (Unsigned)
06/27/13 @ 11:15 am, SWOG W9794, Cycle 6, Day 1:  [name] into the [facility], accompanied by her husband, for the beginning of cycle 6 treatment with high dose carfilzomib.  She had all required labs drawn from her [place] a Cath by the flush nurse in the laboratory prior to her visit with Micah Flesher, PA.  The CBC came back with a hemoglobin of 6.2 g/dL.  A type and cross match for 2 units of PRBC was ordered with a plan to transfuse today.  While the type and cross was being drawn from the accessed and capped Port a Cath, a repeat CBC was collected.  The second CBC revealed a hemoglobin of 9.5 g/dL.  The transfusion was canceled.  There was concern about the collection and handling of the specimens collected from the [facility] a Cath in the flush room, so the staging labs (SPEP, QIG and IFE) were recollected in the infusion room from the patient's Port a Cath, with 10 cc of waste discarded prior to collection.    Spoke with Corinda Gubler, RN in the infusion room prior to treatment about details of treatment and provided her with a sign for the pump.  She will leave the [facility] a Cath accessed and capped to be used for treatment tomorrow.  06/28/13 @ 3:15 pm, C6, D2:  [name] into the [facility] for treatment with high dose carfilzomib per the S1304 protocol.  Spoke with Randolm Idol, RN in the infusion room regarding duration of the drug.  Mrs. Rochford has no new complaints today, and in fact, reports improvement in her upper respiratory infections symptoms.  07/04/13 @ 3:30 pm, End of Study Visit:  [name], accompanied by her husband into the [facility] to see Micah Flesher, PA to discuss her Cycle 6 disease assessment labs prior to scheduled treatment with carfilzomib.  Unfortunately, her labs confirm the suspected progression and she will not continue to receive treatment per protocol.  She had an EKG today, and will have the remainders of the end of study labs and the research blood collected today.  She is scheduled  for a 2DEcho on 07/15/13 and a skeletal survey on 07/08/13.  She has absolutely refused to have another bone marrow biopsy, even though she originally consented in the main study consent.  Her rationale is that she did not have measurable disease in her bone marrow in screening and her disease is followed through blood and urine.  Reassured her that she does not have to undergo the bone marrow biopsy against her will. She agreed to all other end of study procedures and agreed to allow [place] to follow her through her medical record.  She will return to [facility] for a consult with Dr. Iven Finn at Dr. Virgie Dad suggestion, then back here to see Dr. Jana Hakim on 07/22/13.

## 2013-06-27 NOTE — Progress Notes (Signed)
ID: [name]   DOB: 06-Feb-1956  MR#: [id number]  JOI#:[id number]  PCP: Gara Kroner, MD GYN: Princess Bruins SU: OTHER MD: Precious Haws  CHIEF COMPLAINT:  Multiple Myeloma   HISTORY OF PRESENT ILLNESS: Laura Hester has a history of iron deficiency anemia secondary to menorrhagia. However, as this persisted despite near-total cessation of her menses and despite iron supplementation, she was referred to Dr. Teena Irani for GI evaluation. He found the patient's Hb to have decreased from 9.17 June 2011 to 8.3 07/29/2011. MCV was 95.9, ferritin 93.3, with a normal folate and negative ANA. Guaiacs were negative. Creatinine was 0.77.  t-transglutaminase IgA was normal at 2, but the total IgA was 6.9 g. Accordingly on 08/04/2011 an SPEP was obtained, showing an M-spike of 4.5 g, with a secondary M-spike of 0.4 g. Total protein was 10.0 with albumin 3.2, calcium 10.2. Bone survey 08/26/2011 showed multiple bone lesions consistent with myeloma and bone marrow biopsy 09/01/2011 confirmed the diagnosis with 75% myeloma cells in the marrow. Pegge' subsequent history is detailed below.  INTERVAL HISTORY: Laura Hester returns today for followup of her multiple myeloma accompanied by her husband [name]. Today is day 1 cycle 6 of her high-dose carfilzomib, which she is receiving according to SWOG 1304.  She also receives zoledronic acid on a monthly basis, last given 05/30/2013 and due again today.  Darcia' CBC caught our attention this morning with a hemoglobin of 6.2. Clinically, however, this did not seem consistent with her presentation/current condition. She tells me she is a little tired, but overall her energy level is good and she can "do anything she wants to do". She's had no signs of abnormal bleeding. She does have some shortness of breath with minor exertion but denies any chest pain or palpitations.  Subsequently, we will repeat her CBC, with a corrected hemoglobin of 9.5 which seems much more  consistent with her presentation today.  Otherwise, Laura Hester has had an apparent upper respiratory infection which is clearing. Her highest recorded temp was last Tuesday, 06/21/2013, at 102.2. The fever broke on Tuesday, and she's had no fever over 100 since that time. She completed a Z-Pak yesterday and now has only a residual nonproductive cough. She has some occasional wheezing at night, which was not evident during our visit. (Of note she also has a history of asthma.)   Laura Hester also notes that she has had some increased pain in her lower back, primarily on the left side, and she describes this as an occasional "dull ache" which seems to bother her more when she is lying down than it does with activity. She has noticed this only occasionally, approximately 4-5 times overall over the past 3 weeks. At its worst, this pain has only been a 3 or 4 on a scale of 1-10. She is taking no pain medication for this discomfort.    REVIEW OF SYSTEMS: Laura Hester has had no additional fevers, chills, or night sweats. She denies any rashes or skin changes. She's had no mouth ulcers, oral sensitivity, or thrush, and denies any jaw pain or recent dental procedures.. Her appetite is  still great, and she's had no nausea or change in bowel or bladder habits.  she's had no abnormal headaches or dizziness. Other than the lower back pain noted above, she's had no additional myalgias, arthralgias, or bony pain. She denies any peripheral swelling, and also denies any signs at all of deferral neuropathy in either the upper or lower extremities.   A detailed review of  systems is otherwise stable and noncontributory.    PAST MEDICAL HISTORY: Past Medical History  Diagnosis Date  . Fibrocystic breast disease   . Allergic rhinitis   . HTN (hypertension)     mild/ observation only  . Prediabetes   . SUI (stress urinary incontinence, female)   . Overweight (BMI 25.0-29.9)   . Multiple myeloma, stage 3 08/20/11 dx  . Anemia      resolved  . Cancer   . Arthritis     PAST SURGICAL HISTORY: Past Surgical History  Procedure Laterality Date  . Cholecystectomy  1995  . Tonsillectomy and adenoidectomy    . Carpal tunnel release      FAMILY HISTORY Family History  Problem Relation Age of Onset  . Heart disease Father   . Breast cancer Mother   . Heart disease Mother   . Diabetes Paternal Grandfather   . Hypertension Maternal Grandmother   . Hypertension Brother   The patient's parents are in their mid-40s.Her mother has a remote history of breast cancer. She has two brothers, one her twin; no sisters. There is no other history of cancer in the family to her knowledge  GYNECOLOGIC HISTORY: Menarche age 67, first live birth age 89, [place] P67. Last period was January 2013, before that May 2012.  SOCIAL HISTORY: (Updated February 2015) She is a Forensic scientist and taught kindergarten until 2012. Her husband [name], graduated from [employer] and works in Technical brewer.  Daughter [name] works for [employer], married in 2013. Daughter [name] studies and works in [place] and comes home every other week and The patient attends the [place]: Not in place  HEALTH MAINTENANCE: (Updated February 2015) History  Substance Use Topics  . Smoking status: Never Smoker   . Smokeless tobacco: Never Used  . Alcohol Use: No     Colonoscopy: 2009/[facility]  PAP: March 2014, Dr. Dellis Filbert  Bone density: never  Lipid panel: [place]/ "excellent"  Mammography: June of 2014    Allergies  Allergen Reactions  . Wellbutrin [Bupropion Hcl] Hives    Current Outpatient Prescriptions  Medication Sig Dispense Refill  . lidocaine-prilocaine (EMLA) cream Apply topically as needed.  30 g  3  . LORazepam (ATIVAN) 0.5 MG tablet Take 1 tablet (0.5 mg total) by mouth at bedtime as needed for anxiety.  30 tablet  0  . naproxen sodium (ANAPROX) 220 MG tablet Take 1 tablet (220 mg total) by mouth  2 (two) times daily with a meal.  60 tablet  6  . omeprazole (PRILOSEC) 20 MG capsule Take 1 capsule (20 mg total) by mouth daily.  30 capsule  12  . potassium chloride SA (K-DUR,KLOR-CON) 20 MEQ tablet Take 1 tablet (20 mEq total) by mouth daily.  90 tablet  1  . sulfamethoxazole-trimethoprim (BACTRIM DS) 800-160 MG per tablet Take 2 tablets by mouth 2 (two) times a week. Monday  AND THURSDAY  30 tablet  4  . valACYclovir (VALTREX) 500 MG tablet Take 1 tablet (500 mg total) by mouth daily.  30 tablet  12   No current facility-administered medications for this visit.   Facility-Administered Medications Ordered in Other Visits  Medication Dose Route Frequency Provider Last Rate Last Dose  . heparin lock flush 100 unit/mL  500 Units Intracatheter Once PRN Bernarr Longsworth G Hadassa Cermak, PA-C      . sodium chloride 0.9 % injection 10 mL  10 mL Intracatheter PRN Cahlil Sattar Laura Smart, PA-C   10  mL at 05/30/13 1640  . sodium chloride 0.9 % injection 10 mL  10 mL Intracatheter PRN Kalani Sthilaire Laura Smart, PA-C      . Zoledronic Acid (ZOMETA) 4 mg IVPB  4 mg Intravenous Once Chauncey Cruel, MD       OBJECTIVE: Middle-aged white woman who appears comfortable  in no acute distress Filed Vitals:   06/27/13 0940  BP: 153/75  Pulse: 96  Temp: 98.9 F (37.2 C)  Resp: 18     Body mass index is 30.59 kg/(m^2).     ECOG FS: 0    Zubrod Performance Scale: 0 Filed Weights   06/27/13 0940  Weight: 167 lb 4.8 oz (75.887 kg)   Physical Exam: HEENT:  Sclerae anicteric. Conjunctiva pink. Oropharynx clear and moist. Neck supple, trachea midline.  NODES:  No cervical, supraclavicular, or axillary lymphadenopathy palpated.  BREAST EXAM:  Deferred. LUNGS:  Clear to auscultation bilaterally.  No wheezes, rales  or rhonchi. No dullness to percussion   HEART:  Regular rate and rhythm. No murmur appreciated ABDOMEN:  Soft, nontender. No masses or organomegaly palpated.  Positive bowel sounds.  MSK:  No focal spinal tenderness to palpation. Good  range of motion bilaterally in the upper and lower extremities EXTREMITIES:  No peripheral edema.   SKIN:  Benign with no visible rashes or skin changes. No excessive ecchymoses. No petechiae. No  excessive pallor. NEURO:  Nonfocal. Well oriented.  Positive affect.    LAB RESULTS:   I will mention that there were some changes noted on her labs drawn on 05/30/2013, and these are all being repeated today (06/27/2013). Specifically, the lambda free light chain had increased from 8.53 on 04/29/2013 to 13.6 on 05/30/2013. The IgA had increased to 1530, up from 886 in January. The M spike had also increased from 0.33% to 0.47%.   Lab Results  Component Value Date   WBC 8.4 06/27/2013   NEUTROABS 6.1 06/27/2013   HGB 9.5* 06/27/2013   HCT 30.7* 06/27/2013   MCV 94.2 06/27/2013   PLT 393 06/27/2013      Chemistry      Component Value Date/Time   NA 145 06/27/2013 0921   NA 143 12/01/2011 0836   K 3.4* 06/27/2013 0921   K 4.0 12/01/2011 0836   CL 107 09/27/2012 0842   CL 105 12/01/2011 0836   CO2 24 06/27/2013 0921   CO2 29 12/01/2011 0836   BUN 6.8* 06/27/2013 0921   BUN 12 12/01/2011 0836   CREATININE 0.7 06/27/2013 0921   CREATININE 0.52 12/01/2011 0836      Component Value Date/Time   CALCIUM 9.3 06/27/2013 0921   CALCIUM 8.9 12/01/2011 0836   ALKPHOS 57 06/27/2013 0921   ALKPHOS 110 12/01/2011 0836   AST 26 06/27/2013 0921   AST 15 12/01/2011 0836   ALT 16 06/27/2013 0921   ALT 16 12/01/2011 0836   BILITOT 0.33 06/27/2013 0921   BILITOT 0.3 12/01/2011 0836     05/30/2013 Magnesium:  1.7  Phosphorus:  5.0 LDH:   228    STUDIES:  No results found.   ASSESSMENT: 58 y.o.  [place] woman presenting with anemia, found to have an IgA >6g, with an M-spike of 4.5 g (and a second M-spike of 0.4g), urine IFE showing IgA-lambda and lambda light chains, creatinine 0.77, calcium 10.0, albumin 3.1, and beta-2-microglobulin 5.51; with bone survey showing multiple myeloma lesions and bone marrow  biopsy 09/01/2011 showing 75% myeloma cells. 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  (2) on prophylactic coumadin, discontinued as of 12/13/2011.  (3) zolendronic acid every 28 days, 09/19/2011 to 01/05/2012 (five doses)  (4) s/p neupogen mobilization October 2013, with a collection of 4.26 x10^6 CD34 positive cells  (5) status post melphalan at 200 mg/M2 on 02/23/2012, followed by autologous stem cell transplant the following day.  (6) on 06/07/2012 started maintenance bortezomib sq at 1.3 mg/M2 Q2w, with bactrim and acyclovir prophylaxis, the plan being to contuinue this for 2 years as per the HOVON trial  (7) zolendronic acid, given monthly, resumed June 2014  (8) disease progression noted with a rise in the IgA lambda fraction September 2014  (9)  being treated according to the [facility] S1304 randomized to the high dose arm, and will be receiving carfilzomib  on days 1, 2, 8, 9, 15, and 16 of every 28 day cycle. Cycle 1 will be given at a reduced dose of 20 mg per meter square, with subsequent cycles given at 56 mg per meter square.   PLAN:  As noted above, we are repeating all of her lab results today for further assessment, specifically to determine if there is evidence of disease progression. Until those labs are returned, we will continue with her current regimen, however, and she'll proceed with day 1 cycle 6 of  carfilzomib today as scheduled. She'll return for treatment again tomorrow, March 17. I will see her again next week on March 23 to review all of her lab results and discuss her treatment plan.   She is due for her monthly infusion of zoledronic acid today which she will also receive.  Her upper respiratory infection seems to be clearing. I did recommend that she try taking Mucinex in the  morning to loosen up her cough, and she can take a cough suppressant such as Delsym at bedtime. I encouraged her to drink plenty of fluids which she seems to be doing well. Of course she knows to call with any fevers of 100 or above. I also offered her a chest x-ray today which she currently declines. She feels like she is "getting better", but will call if she has any worsening symptoms and will reconsider at that time.  Both Silva Bandy and [name] voice their understanding and agreement with our plan as detailed above. They both know to call as always with any changes or problems prior to her next scheduled followup.   Aniyha Tate, PA-C     06/27/2013

## 2013-06-28 ENCOUNTER — Ambulatory Visit (HOSPITAL_BASED_OUTPATIENT_CLINIC_OR_DEPARTMENT_OTHER): Payer: BC Managed Care – PPO

## 2013-06-28 DIAGNOSIS — C9000 Multiple myeloma not having achieved remission: Secondary | ICD-10-CM

## 2013-06-28 DIAGNOSIS — Z5112 Encounter for antineoplastic immunotherapy: Secondary | ICD-10-CM

## 2013-06-28 LAB — C-REACTIVE PROTEIN: CRP: 3.5 mg/dL — ABNORMAL HIGH (ref <0.60)

## 2013-06-28 MED ORDER — ONDANSETRON 8 MG/50ML IVPB (CHCC)
8.0000 mg | Freq: Once | INTRAVENOUS | Status: AC
  Administered 2013-06-28: 8 mg via INTRAVENOUS

## 2013-06-28 MED ORDER — DEXAMETHASONE SODIUM PHOSPHATE 20 MG/5ML IJ SOLN
INTRAMUSCULAR | Status: AC
  Filled 2013-06-28: qty 5

## 2013-06-28 MED ORDER — HEPARIN SOD (PORK) LOCK FLUSH 100 UNIT/ML IV SOLN
500.0000 [IU] | Freq: Once | INTRAVENOUS | Status: AC | PRN
  Administered 2013-06-28: 500 [IU]
  Filled 2013-06-28: qty 5

## 2013-06-28 MED ORDER — ONDANSETRON 8 MG/NS 50 ML IVPB
INTRAVENOUS | Status: AC
  Filled 2013-06-28: qty 8

## 2013-06-28 MED ORDER — DEXAMETHASONE SODIUM PHOSPHATE 20 MG/5ML IJ SOLN
20.0000 mg | Freq: Once | INTRAMUSCULAR | Status: AC
  Administered 2013-06-28: 20 mg via INTRAVENOUS

## 2013-06-28 MED ORDER — INV-CARFILZOMIB CHEMO INJECTION 60 MG SWOG S1304
56.0000 mg/m2 | Freq: Once | INTRAVENOUS | Status: AC
  Administered 2013-06-28: 102 mg via INTRAVENOUS
  Filled 2013-06-28: qty 51

## 2013-06-28 MED ORDER — SODIUM CHLORIDE 0.9 % IJ SOLN
10.0000 mL | INTRAMUSCULAR | Status: DC | PRN
  Administered 2013-06-28: 10 mL
  Filled 2013-06-28: qty 10

## 2013-06-28 MED ORDER — SODIUM CHLORIDE 0.9 % IV SOLN
Freq: Once | INTRAVENOUS | Status: AC
  Administered 2013-06-28: 15:00:00 via INTRAVENOUS

## 2013-06-29 LAB — UIFE/LIGHT CHAINS/TP QN, 24-HR UR
Albumin, U: DETECTED
Alpha 1, Urine: DETECTED — AB
Alpha 2, Urine: DETECTED — AB
Beta, Urine: DETECTED — AB
FREE KAPPA LT CHAINS, UR: 0.51 mg/dL (ref 0.14–2.42)
FREE KAPPA/LAMBDA RATIO: 0.17 ratio — AB (ref 2.04–10.37)
FREE LAMBDA LT CHAINS, UR: 2.92 mg/dL — AB (ref 0.02–0.67)
FREE LT CHN EXCR RATE: 12.24 mg/d
Free Lambda Excretion/Day: 70.08 mg/d
GAMMA UR: DETECTED — AB
Time: 24 hours
Total Protein, Urine-Ur/day: 1510 mg/d — ABNORMAL HIGH (ref 10–140)
Total Protein, Urine: 62.9 mg/dL
VOLUME, URINE-UPE24: 2400 mL

## 2013-06-29 LAB — SPEP & IFE WITH QIG
ALPHA-2-GLOBULIN: 11.9 % — AB (ref 7.1–11.8)
Albumin ELP: 38.7 % — ABNORMAL LOW (ref 55.8–66.1)
Alpha-1-Globulin: 6.1 % — ABNORMAL HIGH (ref 2.9–4.9)
BETA GLOBULIN: 9.2 % — AB (ref 4.7–7.2)
Beta 2: 25 % — ABNORMAL HIGH (ref 3.2–6.5)
Gamma Globulin: 9.1 % — ABNORMAL LOW (ref 11.1–18.8)
IGA: 1910 mg/dL — AB (ref 69–380)
IGG (IMMUNOGLOBIN G), SERUM: 133 mg/dL — AB (ref 690–1700)
IgM, Serum: <5 mg/dL — ABNORMAL LOW (ref 52–322)
M-SPIKE, %: 0.55 g/dL
Total Protein, Serum Electrophoresis: 6.3 g/dL (ref 6.0–8.3)

## 2013-06-29 LAB — KAPPA/LAMBDA LIGHT CHAINS
KAPPA FREE LGHT CHN: 0.03 mg/dL — AB (ref 0.33–1.94)
KAPPA LAMBDA RATIO: 0 — AB (ref 0.26–1.65)
LAMBDA FREE LGHT CHN: 29.1 mg/dL — AB (ref 0.57–2.63)

## 2013-06-29 LAB — UPEP/TP, 24-HR URINE
Albumin: 66.1 %
Alpha-1-Globulin, U: 20.3 %
Alpha-2-Globulin, U: 6.9 %
Beta Globulin, U: 5.2 %
Collection Interval: 24 hours
Gamma Globulin, U: 1.5 %
TOTAL PROTEIN, URINE: 81 mg/dL
Total Protein, Urine/Day: 1944 mg/d — ABNORMAL HIGH (ref 50–100)
Total Volume, Urine: 2400 mL

## 2013-07-04 ENCOUNTER — Ambulatory Visit: Payer: BC Managed Care – PPO

## 2013-07-04 ENCOUNTER — Encounter: Payer: Self-pay | Admitting: Physician Assistant

## 2013-07-04 ENCOUNTER — Other Ambulatory Visit: Payer: Self-pay

## 2013-07-04 ENCOUNTER — Telehealth: Payer: Self-pay | Admitting: Oncology

## 2013-07-04 ENCOUNTER — Ambulatory Visit (HOSPITAL_BASED_OUTPATIENT_CLINIC_OR_DEPARTMENT_OTHER): Payer: BC Managed Care – PPO

## 2013-07-04 ENCOUNTER — Ambulatory Visit (HOSPITAL_BASED_OUTPATIENT_CLINIC_OR_DEPARTMENT_OTHER): Payer: BC Managed Care – PPO | Admitting: Physician Assistant

## 2013-07-04 VITALS — BP 149/74 | HR 114 | Temp 99.0°F | Resp 18 | Ht 62.0 in | Wt 167.9 lb

## 2013-07-04 DIAGNOSIS — D630 Anemia in neoplastic disease: Secondary | ICD-10-CM

## 2013-07-04 DIAGNOSIS — C9000 Multiple myeloma not having achieved remission: Secondary | ICD-10-CM

## 2013-07-04 DIAGNOSIS — M545 Low back pain, unspecified: Secondary | ICD-10-CM

## 2013-07-04 DIAGNOSIS — Z452 Encounter for adjustment and management of vascular access device: Secondary | ICD-10-CM

## 2013-07-04 DIAGNOSIS — Z95828 Presence of other vascular implants and grafts: Secondary | ICD-10-CM

## 2013-07-04 LAB — RESEARCH LABS

## 2013-07-04 MED ORDER — HEPARIN SOD (PORK) LOCK FLUSH 100 UNIT/ML IV SOLN
500.0000 [IU] | Freq: Once | INTRAVENOUS | Status: AC
  Administered 2013-07-04: 500 [IU] via INTRAVENOUS
  Filled 2013-07-04: qty 5

## 2013-07-04 MED ORDER — SODIUM CHLORIDE 0.9 % IJ SOLN
10.0000 mL | INTRAMUSCULAR | Status: DC | PRN
  Administered 2013-07-04: 10 mL via INTRAVENOUS
  Filled 2013-07-04: qty 10

## 2013-07-04 NOTE — Telephone Encounter (Signed)
, °

## 2013-07-04 NOTE — Progress Notes (Signed)
ID: Laura Hester   DOB: February 24, 1956  MR#: [id number]  CSN#:[id number]  PCP: Gara Kroner, MD GYN: Princess Bruins, MD SU: OTHER MD: Precious Haws, MD at [facility] (fax # [phone])  CHIEF COMPLAINT:  Multiple Myeloma   HISTORY OF PRESENT ILLNESS: Laura Hester has a history of iron deficiency anemia secondary to menorrhagia. However, as this persisted despite near-total cessation of her menses and despite iron supplementation, she was referred to Dr. Teena Irani for GI evaluation. He found the patient's Hb to have decreased from 9.17 June 2011 to 8.3 07/29/2011. MCV was 95.9, ferritin 93.3, with a normal folate and negative ANA. Guaiacs were negative. Creatinine was 0.77.  t-transglutaminase IgA was normal at 2, but the total IgA was 6.9 g. Accordingly on 08/04/2011 an SPEP was obtained, showing an M-spike of 4.5 g, with a secondary M-spike of 0.4 g. Total protein was 10.0 with albumin 3.2, calcium 10.2. Bone survey 08/26/2011 showed multiple bone lesions consistent with myeloma and bone marrow biopsy 09/01/2011 confirmed the diagnosis with 75% myeloma cells in the marrow.   Laura Hester' subsequent history is detailed below.  INTERVAL HISTORY: Laura Hester returns today for followup of her multiple myeloma accompanied by her husband Laura Hester. She has been receiving high-dose carfilzomibaccording to [facility] 1304, with today being day 8 cycle 6.  She also receives zoledronic acid on a monthly basis, last given 06/27/2013. Laura Hester is here today to review recent lab results and discuss her treatment plan.  She is very anxious about the recent changes in her labs, and is anxious to discuss a change in treatment.      REVIEW OF SYSTEMS: Physically, [name] is tired, but had no new complaints today. She's had no fevers or chills.  She denies any rashes or skin changes. She's had no signs of abnormal bleeding. She also denies any mouth ulcers, oral sensitivity or thrush. She's had no jaw pain or dental  procedures/extractions. She's eating well with no nausea or emesis and denies any change in bowel or bladder habits. She does note that her urine has appeared more "foamy". She continues to have pain in lower back, primarily on the left side. She denies any additional myalgias, arthralgias, or bony pain today. She's had no peripheral swelling has had no signs of neuropathy in either the upper or lower extremities. She also denies any abnormal headaches and has had no dizziness.  A detailed review of systems is otherwise stable and noncontributory.    PAST MEDICAL HISTORY: Past Medical History  Diagnosis Date  . Fibrocystic breast disease   . Allergic rhinitis   . HTN (hypertension)     mild/ observation only  . Prediabetes   . SUI (stress urinary incontinence, female)   . Overweight (BMI 25.0-29.9)   . Multiple myeloma, stage 3 08/20/11 dx  . Anemia     resolved  . Cancer   . Arthritis     PAST SURGICAL HISTORY: Past Surgical History  Procedure Laterality Date  . Cholecystectomy  1995  . Tonsillectomy and adenoidectomy    . Carpal tunnel release      FAMILY HISTORY Family History  Problem Relation Age of Onset  . Heart disease Father   . Breast cancer Mother   . Heart disease Mother   . Diabetes Paternal Grandfather   . Hypertension Maternal Grandmother   . Hypertension Brother   The patient's parents are in their mid-4s.Her mother has a remote history of breast cancer. She has two brothers, one her twin; no sisters. There  is no other history of cancer in the family to her knowledge  GYNECOLOGIC HISTORY: Menarche age 39, first live birth age 62, [place] P35. Last period was January 2013, before that May 2012.  SOCIAL HISTORY: (Updated  07/04/2013) She is a Forensic scientist and taught kindergarten until 2012. Her husband Laura Hester, graduated from [employer] and works in Technical brewer.  Daughter Laura Hester works for [employer], married in 2013. Daughter Laura Hester studies and works in  [place] and comes home every other week and The patient attends the [place]: Not in place  HEALTH MAINTENANCE: (Updated 07/04/2013) History  Substance Use Topics  . Smoking status: Never Smoker   . Smokeless tobacco: Never Used  . Alcohol Use: No     Colonoscopy: 2009/[facility]  PAP: March 2014, Dr. Dellis Filbert  Bone density: never  Lipid panel: [place]/ "excellent"  Mammography: June of 2014    Allergies  Allergen Reactions  . Wellbutrin [Bupropion Hcl] Hives    Current Outpatient Prescriptions  Medication Sig Dispense Refill  . lidocaine-prilocaine (EMLA) cream Apply topically as needed.  30 g  3  . LORazepam (ATIVAN) 0.5 MG tablet Take 1 tablet (0.5 mg total) by mouth at bedtime as needed for anxiety.  30 tablet  0  . naproxen sodium (ANAPROX) 220 MG tablet Take 1 tablet (220 mg total) by mouth 2 (two) times daily with a meal.  60 tablet  6  . omeprazole (PRILOSEC) 20 MG capsule Take 1 capsule (20 mg total) by mouth daily.  30 capsule  12  . potassium chloride SA (K-DUR,KLOR-CON) 20 MEQ tablet Take 1 tablet (20 mEq total) by mouth daily.  90 tablet  1  . sulfamethoxazole-trimethoprim (BACTRIM DS) 800-160 MG per tablet Take 2 tablets by mouth 2 (two) times a week. Monday  AND THURSDAY  30 tablet  4  . valACYclovir (VALTREX) 500 MG tablet Take 1 tablet (500 mg total) by mouth daily.  30 tablet  12   No current facility-administered medications for this visit.   Facility-Administered Medications Ordered in Other Visits  Medication Dose Route Frequency Provider Last Rate Last Dose  . sodium chloride 0.9 % injection 10 mL  10 mL Intracatheter PRN Shearon Clonch G Kohl Polinsky, PA-C   10 mL at 05/30/13 1640  . sodium chloride 0.9 % injection 10 mL  10 mL Intravenous PRN Chauncey Cruel, MD   10 mL at 07/04/13 1545   OBJECTIVE: Middle-aged white woman who appears anxious but is in no acute distress Filed Vitals:   07/04/13 1357  BP: 149/74  Pulse: 114   Temp: 99 F (37.2 C)  Resp: 18     Body mass index is 30.7 kg/(m^2).     ECOG FS: 0    Zubrod Performance Scale: 0 Filed Weights   07/04/13 1357  Weight: 167 lb 14.4 oz (76.159 kg)   Physical Exam: HEENT:  Sclerae anicteric.Oropharynx clear, pink, and moist. Neck supple, trachea midline. No thyromegaly. NODES:  No cervical, supraclavicular, or axillary lymphadenopathy palpated.  BREAST EXAM:  Deferred. LUNGS:  Clear to auscultation bilaterally with good excursion. No dullness to percussion. No wheezes, rales  or rhonchi. HEART:  Regular rate and rhythm.  ABDOMEN:  Soft, nontender. No masses or organomegaly palpated. No guarding or rebound. Positive bowel sounds.  MSK:  No focal spinal tenderness to palpation. Good range of motion bilaterally in the upper and lower extremities EXTREMITIES:  No peripheral edema.   SKIN:  Benign with no visible  rashes or skin changes. No excessive ecchymoses. No petechiae. No  pallor. NEURO:  Nonfocal. Well oriented.  Appropriate affect.    LAB RESULTS:     Lab Results  Component Value Date   WBC 8.4 06/27/2013   NEUTROABS 6.1 06/27/2013   HGB 9.5* 06/27/2013   HCT 30.7* 06/27/2013   MCV 94.2 06/27/2013   PLT 393 06/27/2013      Chemistry      Component Value Date/Time   NA 145 06/27/2013 0921   NA 143 12/01/2011 0836   K 3.4* 06/27/2013 0921   K 4.0 12/01/2011 0836   CL 107 09/27/2012 0842   CL 105 12/01/2011 0836   CO2 24 06/27/2013 0921   CO2 29 12/01/2011 0836   BUN 6.8* 06/27/2013 0921   BUN 12 12/01/2011 0836   CREATININE 0.7 06/27/2013 0921   CREATININE 0.52 12/01/2011 0836      Component Value Date/Time   CALCIUM 9.3 06/27/2013 0921   CALCIUM 8.9 12/01/2011 0836   ALKPHOS 57 06/27/2013 0921   ALKPHOS 110 12/01/2011 0836   AST 26 06/27/2013 0921   AST 15 12/01/2011 0836   ALT 16 06/27/2013 0921   ALT 16 12/01/2011 0836   BILITOT 0.33 06/27/2013 0921   BILITOT 0.3 12/01/2011 0836      06/27/2013 Magnesium:  1.9  Phosphorus:  4.4 LDH:   240 CRP:    3.5   Kappa/lamba Light Chains   06/27/2013 05/30/2013 Kappa free light chain   0.03  0.03 Lambda free light chain  29.10  13.60  Kappa:Lambda ratio   0.00  0.00  SPEP & IFE with QIG  06/27/2013 05/30/2013 IgG, Serum    133  202  IgA     1910  1530 IgM, Serum    <5  <5 Total Protein, Serum   6.3  6.8 Albumin ELP    38.7  48.1 Alpha-1-Globulin   6.1  5.7 Alpha-2-Globulin   11.9  11.1 Beta Globulin    9.2  8.5  Beta 2     25.0  19.6 Gmma Globulin   9.1  7.0 M-Spike, %    0.55  0.47  Protein Electro, 24-Hour Urine 06/27/2013 05/30/2013 Total volume, urine   2400  2400 Collective interval   24 hr  24 hr Total protein, urine   81  40 Total protein, urine/day  1944  960 Albumin    66.1  68.3 Alpha-1-globulin, urine  20.3  17.9 Alpha-2-globulin, urine  6.9  7.1 Beta globulin, urine   5.2  4.5 Gamma globulin, urine  1.5  2.2  Monoclonal band 1   None Det None Det Monoclonal band 2   None Det None Det     STUDIES:  No results found.  A bone survey is scheduled for 07/08/2013. A repeat echocardiogram as scheduled for 07/15/2013.    ASSESSMENT: 58 y.o.  [place] woman presenting with anemia, found to have an IgA >6g, with an M-spike of 4.5 g (and a second M-spike of 0.4g), urine IFE showing IgA-lambda and lambda light chains, creatinine 0.77, calcium 10.0, albumin 3.1, and beta-2-microglobulin 5.51; with bone survey showing multiple myeloma lesions and bone marrow biopsy 09/01/2011 showing 75% myeloma cells. 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  (2) on prophylactic coumadin, discontinued as of 12/13/2011.  (3) zolendronic  acid every 28 days, 09/19/2011 to 01/05/2012 (five doses)  (4) s/p neupogen mobilization  October 2013, with a collection of 4.26 x10^6 CD34 positive cells  (5) status post melphalan at 200 mg/M2 on 02/23/2012, followed by autologous stem cell transplant the following day.  (6) on 06/07/2012 started maintenance bortezomib sq at 1.3 mg/M2 Q2w, with bactrim and acyclovir prophylaxis, the plan being to contuinue this for 2 years as per the [facility] trial  (7) zolendronic acid, given monthly, resumed June 2014  (8) disease progression noted with a rise in the IgA lambda fraction September 2014  (9)  treated according to the [facility] S1304 randomized to the high dose arm, and will be receiving carfilzomib  on days 1, 2, 8, 9, 15, and 16 of every 28 day cycle. Cycle 1 given at a reduced dose of 20 mg per meter square, with subsequent cycles given at 56 mg per meter square.  Regimen  discontinued as of 07/04/2013 (day 8 cycle 6) due to evidence of progression.   PLAN:  This case was reviewed with Dr. Jana Hakim who also spoke extensively with the patient today. According to the lab results above, there is no question that unfortunately  Keyondra' disease has progressed, and we are discontinuing treatment with carfilzomib according to the  S1304 protocol.   Per protocol, Janira will have an EKG today. She's Re: scheduled for a bone survey later this week, and will have a repeat echocardiogram next week on April 3. Typically, a bone marrow biopsy would also be obtained at the end of protocol, but she declines since she has had a biopsy within the last 6 months.  Dr. Virgie Dad suggestion is to refer her Symphany back to Dr. Precious Haws at [facility] for repeat consultation and advice regarding treatment. He has also suggested that she consider signing up for the [place] program. We also have a protocol here which will open approximately 07/19/2013, combining treatment with lenalidomide, dexamethasone and elotuzumab, and it is possible that Ezelle might qualify for that protocol as well.  We are sending with  Jamile to Dr. Iven Finn  a copy of the protocol synopsis as well as a copy of all of her most recent labs. I will also fax a copy of today's office note along with her recent labs to Dr. Iven Finn, and he also should also have access to all of these records through "care anywhere" on EP[employer].  Angely will keep all of her appointments as scheduled and listed above. We hope to get her in to see Dr. Iven Finn in the next 2 weeks, and she will return to see Dr. Jana Hakim on April 10 to review her treatment plan.  Both Silva Bandy and Laura Hester  had multiple questions today, and are appropriately concerned about this progression. They both voice, however,  their understanding and agreement with our plan as detailed above. They both know to call as always with any changes or problems prior to her next scheduled followup.   Micah Flesher, PA-C     07/04/2013

## 2013-07-04 NOTE — Patient Instructions (Signed)

## 2013-07-05 ENCOUNTER — Ambulatory Visit: Payer: BC Managed Care – PPO

## 2013-07-05 LAB — CK TOTAL AND CKMB (NOT AT ARMC)
CK, MB: <0.7 ng/mL (ref 0.0–5.0)
Total CK: 36 U/L (ref 7–177)

## 2013-07-05 LAB — BRAIN NATRIURETIC PEPTIDE: Brain Natriuretic Peptide: 59.9 pg/mL (ref 0.0–100.0)

## 2013-07-06 ENCOUNTER — Other Ambulatory Visit: Payer: Self-pay | Admitting: Physician Assistant

## 2013-07-08 ENCOUNTER — Ambulatory Visit (HOSPITAL_COMMUNITY)
Admission: RE | Admit: 2013-07-08 | Discharge: 2013-07-08 | Disposition: A | Discharge status: Home / Self Care | Payer: BC Managed Care – PPO | Source: Ambulatory Visit | Attending: Oncology | Admitting: Oncology

## 2013-07-08 ENCOUNTER — Other Ambulatory Visit: Payer: Self-pay | Admitting: Oncology

## 2013-07-08 DIAGNOSIS — I517 Cardiomegaly: Secondary | ICD-10-CM | POA: Insufficient documentation

## 2013-07-08 DIAGNOSIS — C9000 Multiple myeloma not having achieved remission: Secondary | ICD-10-CM

## 2013-07-11 ENCOUNTER — Other Ambulatory Visit: Payer: Self-pay | Admitting: Oncology

## 2013-07-11 ENCOUNTER — Ambulatory Visit: Payer: BC Managed Care – PPO

## 2013-07-12 ENCOUNTER — Ambulatory Visit: Payer: BC Managed Care – PPO

## 2013-07-14 ENCOUNTER — Telehealth: Payer: Self-pay | Admitting: *Deleted

## 2013-07-14 NOTE — Telephone Encounter (Signed)
Saw Dr. Iven Finn at [facility], who agrees with the trial that we can offer in [place] that [place] spoke w/her about. She agrees to begin this as soon as she can. Will discuss at her visit on 07/22/13. Was told she could come to [facility] for one of their trials if she has progression on the [place] trial.

## 2013-07-15 ENCOUNTER — Ambulatory Visit (HOSPITAL_COMMUNITY): Payer: BC Managed Care – PPO | Attending: Cardiovascular Disease | Admitting: Cardiology

## 2013-07-15 DIAGNOSIS — C9000 Multiple myeloma not having achieved remission: Secondary | ICD-10-CM

## 2013-07-15 NOTE — Progress Notes (Signed)
Echo performed. 

## 2013-07-18 ENCOUNTER — Encounter: Payer: Self-pay | Admitting: *Deleted

## 2013-07-21 ENCOUNTER — Other Ambulatory Visit: Payer: Self-pay

## 2013-07-22 ENCOUNTER — Ambulatory Visit (HOSPITAL_BASED_OUTPATIENT_CLINIC_OR_DEPARTMENT_OTHER): Payer: BC Managed Care – PPO

## 2013-07-22 ENCOUNTER — Other Ambulatory Visit (HOSPITAL_BASED_OUTPATIENT_CLINIC_OR_DEPARTMENT_OTHER): Payer: BC Managed Care – PPO

## 2013-07-22 ENCOUNTER — Ambulatory Visit (HOSPITAL_BASED_OUTPATIENT_CLINIC_OR_DEPARTMENT_OTHER): Payer: BC Managed Care – PPO | Admitting: Oncology

## 2013-07-22 ENCOUNTER — Telehealth: Payer: Self-pay | Admitting: Oncology

## 2013-07-22 ENCOUNTER — Ambulatory Visit (HOSPITAL_COMMUNITY)
Admission: RE | Admit: 2013-07-22 | Discharge: 2013-07-22 | Disposition: A | Discharge status: Home / Self Care | Payer: BC Managed Care – PPO | Source: Ambulatory Visit | Attending: Oncology | Admitting: Oncology

## 2013-07-22 ENCOUNTER — Telehealth: Payer: Self-pay | Admitting: *Deleted

## 2013-07-22 ENCOUNTER — Other Ambulatory Visit: Payer: Self-pay | Admitting: *Deleted

## 2013-07-22 VITALS — BP 125/57 | HR 98 | Temp 97.7°F | Resp 18

## 2013-07-22 VITALS — BP 145/65 | HR 120 | Temp 98.4°F | Resp 20 | Ht 62.0 in | Wt 166.5 lb

## 2013-07-22 DIAGNOSIS — M545 Low back pain, unspecified: Secondary | ICD-10-CM

## 2013-07-22 DIAGNOSIS — C9000 Multiple myeloma not having achieved remission: Secondary | ICD-10-CM

## 2013-07-22 DIAGNOSIS — Z95828 Presence of other vascular implants and grafts: Secondary | ICD-10-CM

## 2013-07-22 DIAGNOSIS — D649 Anemia, unspecified: Secondary | ICD-10-CM

## 2013-07-22 DIAGNOSIS — J45909 Unspecified asthma, uncomplicated: Secondary | ICD-10-CM

## 2013-07-22 DIAGNOSIS — J069 Acute upper respiratory infection, unspecified: Secondary | ICD-10-CM

## 2013-07-22 DIAGNOSIS — D630 Anemia in neoplastic disease: Secondary | ICD-10-CM

## 2013-07-22 LAB — CBC WITH DIFFERENTIAL/PLATELET
BASO%: 0.5 % (ref 0.0–2.0)
Basophils Absolute: 0 10*3/uL (ref 0.0–0.1)
EOS%: 1.2 % (ref 0.0–7.0)
Eosinophils Absolute: 0.1 10*3/uL (ref 0.0–0.5)
HEMATOCRIT: 23.1 % — AB (ref 34.8–46.6)
HGB: 7 g/dL — ABNORMAL LOW (ref 11.6–15.9)
LYMPH#: 0.9 10*3/uL (ref 0.9–3.3)
LYMPH%: 10.5 % — ABNORMAL LOW (ref 14.0–49.7)
MCH: 28.1 pg (ref 25.1–34.0)
MCHC: 30.3 g/dL — AB (ref 31.5–36.0)
MCV: 92.8 fL (ref 79.5–101.0)
MONO#: 0.6 10*3/uL (ref 0.1–0.9)
MONO%: 6.4 % (ref 0.0–14.0)
NEUT#: 7.2 10*3/uL — ABNORMAL HIGH (ref 1.5–6.5)
NEUT%: 81.4 % — AB (ref 38.4–76.8)
Platelets: 203 10*3/uL (ref 145–400)
RBC: 2.49 10*6/uL — ABNORMAL LOW (ref 3.70–5.45)
RDW: 17.7 % — AB (ref 11.2–14.5)
WBC: 8.9 10*3/uL (ref 3.9–10.3)
nRBC: 1 % — ABNORMAL HIGH (ref 0–0)

## 2013-07-22 LAB — PREPARE RBC (CROSSMATCH)

## 2013-07-22 LAB — TECHNOLOGIST REVIEW

## 2013-07-22 MED ORDER — DIPHENHYDRAMINE HCL 25 MG PO CAPS
25.0000 mg | ORAL_CAPSULE | Freq: Once | ORAL | Status: AC
  Administered 2013-07-22: 25 mg via ORAL

## 2013-07-22 MED ORDER — DIPHENHYDRAMINE HCL 25 MG PO CAPS
ORAL_CAPSULE | ORAL | Status: AC
  Filled 2013-07-22: qty 1

## 2013-07-22 MED ORDER — ACETAMINOPHEN 325 MG PO TABS
ORAL_TABLET | ORAL | Status: AC
  Filled 2013-07-22: qty 2

## 2013-07-22 MED ORDER — SODIUM CHLORIDE 0.9 % IJ SOLN
10.0000 mL | INTRAMUSCULAR | Status: DC | PRN
  Administered 2013-07-22: 10 mL via INTRAVENOUS
  Filled 2013-07-22: qty 10

## 2013-07-22 MED ORDER — TRAMADOL HCL 50 MG PO TABS: 50.0000 mg | ORAL_TABLET | Freq: Four times a day (QID) | ORAL | Status: DC | PRN

## 2013-07-22 MED ORDER — HEPARIN SOD (PORK) LOCK FLUSH 100 UNIT/ML IV SOLN
500.0000 [IU] | Freq: Once | INTRAVENOUS | Status: AC
  Administered 2013-07-22: 500 [IU] via INTRAVENOUS
  Filled 2013-07-22: qty 5

## 2013-07-22 MED ORDER — LENALIDOMIDE 25 MG PO CAPS: 25.0000 mg | ORAL_CAPSULE | Freq: Every day | ORAL | Status: DC

## 2013-07-22 MED ORDER — SODIUM CHLORIDE 0.9 % IV SOLN
250.0000 mL | Freq: Once | INTRAVENOUS | Status: AC
  Administered 2013-07-22: 250 mL via INTRAVENOUS

## 2013-07-22 MED ORDER — ACETAMINOPHEN 325 MG PO TABS
650.0000 mg | ORAL_TABLET | Freq: Once | ORAL | Status: AC
  Administered 2013-07-22: 650 mg via ORAL

## 2013-07-22 MED ORDER — SODIUM CHLORIDE 0.9 % IJ SOLN
3.0000 mL | INTRAMUSCULAR | Status: DC | PRN
  Filled 2013-07-22: qty 10

## 2013-07-22 NOTE — Telephone Encounter (Signed)
Per staff message and POF I have scheduled appts.  JMW  

## 2013-07-22 NOTE — Patient Instructions (Signed)

## 2013-07-22 NOTE — Progress Notes (Signed)
ID: Laura Hester   DOB: [birth year]  MR#: [id number]  CSN#:[id number]  PCP: Gara Kroner, MD GYN: Princess Bruins, MD SU: OTHER MD: Precious Haws, MD at [facility] (fax # [phone]), Rae Mar  CHIEF COMPLAINT:  Multiple Myeloma   HISTORY OF PRESENT ILLNESS: Laura Hester has a history of iron deficiency anemia secondary to menorrhagia. However, as this persisted despite near-total cessation of her menses and despite iron supplementation, she was referred to Dr. Teena Irani for GI evaluation. He found the patient's Hb to have decreased from 9.17 June 2011 to 8.3 07/29/2011. MCV was 95.9, ferritin 93.3, with a normal folate and negative ANA. Guaiacs were negative. Creatinine was 0.77.  t-transglutaminase IgA was normal @ 2, but the total IgA was 6.9 g. Accordingly on 08/04/2011 an SPEP was obtained, showing an M-spike of 4.5 g, with a secondary M-spike of 0.4 g. Total protein was 10.0 with albumin 3.2, calcium 10.2. Bone survey 08/26/2011 showed multiple bone lesions consistent with myeloma and bone marrow biopsy 09/01/2011 confirmed the diagnosis with 75% myeloma cells in the marrow.   Laura Hester' subsequent history is detailed below.  INTERVAL HISTORY: Laura Hester returns today for followup of her multiple myeloma accompanied by her husband Laura Hester. Since her last visit here she has been discussing participation in the elotuzumab study, which is not monoclonal antibody together with lenalidomide and dexamethasone. She met with Dr. Iven Finn at [facility], and he encouraged her to participate. He also reassured her that if we do not get a good or durable response she can come back to [facility] and discuss what other studies or other available treatments may be available with him and with Dr. Melba Coon. She also received her first round of post transplant vaccinations at [facility] on 07/14/2013.    REVIEW OF SYSTEMS: Laura Hester continues to do remarkably well except for fatigue. Doubtless this is due to her anemia. She had a Pap  smear on April 7 which she tells me was unremarkable. Overall she is "doing well", but does have some pain involving the hips and back. This is fairly well controlled on Naprosyn but today we discussed other options. Occasionally she notices a rapid heartbeat and feels a little bit dizzy or faint. She keeps a dry cough this time of year because of allergies. There have been no fevers, rash, or bleeding. A detailed review of systems today was otherwise noncontributory   PAST MEDICAL HISTORY: Past Medical History  Diagnosis Date  . Fibrocystic breast disease   . Allergic rhinitis   . HTN (hypertension)     mild/ observation only  . Prediabetes   . SUI (stress urinary incontinence, female)   . Overweight (BMI 25.0-29.9)   . Multiple myeloma, stage 3 08/20/11 dx  . Anemia     resolved  . Cancer   . Arthritis     PAST SURGICAL HISTORY: Past Surgical History  Procedure Laterality Date  . Cholecystectomy  1995  . Tonsillectomy and adenoidectomy    . Carpal tunnel release      FAMILY HISTORY Family History  Problem Relation Age of Onset  . Heart disease Father   . Breast cancer Mother   . Heart disease Mother   . Diabetes Paternal Grandfather   . Hypertension Maternal Grandmother   . Hypertension Brother   The patient's parents are in their mid-20s.Her mother has a remote history of breast cancer. She has two brothers, one her twin; no sisters. There is no other history of cancer in the family to her knowledge  GYNECOLOGIC HISTORY: Menarche age 9, first live birth age 83, [place] P70. Last period was January 2013, before that May 2012.  SOCIAL HISTORY: (Updated  07/04/2013) She is a Forensic scientist and taught kindergarten until 2012. Her husband Laura Hester, graduated from [employer] and works in Technical brewer.  Daughter Laura Hester works for [employer], married in 2013. Daughter Laura Hester studies and works in [place] and comes home every other week and The patient attends the [employer]: Not in place  HEALTH MAINTENANCE: (Updated 07/04/2013) History  Substance Use Topics  . Smoking status: Never Smoker   . Smokeless tobacco: Never Used  . Alcohol Use: No     Colonoscopy: 2009/[facility]  PAP: March 2014, Dr. Dellis Filbert  Bone density: never  Lipid panel: [place]/ "excellent"  Mammography: June of 2014    Allergies  Allergen Reactions  . Wellbutrin [Bupropion Hcl] Hives    Current Outpatient Prescriptions  Medication Sig Dispense Refill  . lidocaine-prilocaine (EMLA) cream Apply topically as needed.  30 g  3  . LORazepam (ATIVAN) 0.5 MG tablet Take 1 tablet (0.5 mg total) by mouth at bedtime as needed for anxiety.  30 tablet  0  . naproxen sodium (ANAPROX) 220 MG tablet Take 1 tablet (220 mg total) by mouth 2 (two) times daily with a meal.  60 tablet  6  . omeprazole (PRILOSEC) 20 MG capsule Take 1 capsule (20 mg total) by mouth daily.  30 capsule  12  . Potassium Chloride ER 20 MEQ TBCR TAKE 1 TABLET (20 MEQ TOTAL) BY MOUTH DAILY.  90 tablet  0  . sulfamethoxazole-trimethoprim (BACTRIM DS) 800-160 MG per tablet Take 2 tablets by mouth 2 (two) times a week. Monday  AND THURSDAY  30 tablet  4  . valACYclovir (VALTREX) 500 MG tablet Take 1 tablet (500 mg total) by mouth daily.  30 tablet  12   No current facility-administered medications for this visit.   Facility-Administered Medications Ordered in Other Visits  Medication Dose Route Frequency Provider Last Rate Last Dose  . sodium chloride 0.9 % injection 10 mL  10 mL Intracatheter PRN Amy Milda Smart, PA-C   10 mL at 05/30/13 1640   OBJECTIVE: Middle-aged white woman in no acute distress Filed Vitals:   07/22/13 0856  BP: 145/65  Pulse: 120  Temp: 98.4 F (36.9 C)  Resp: 20     Body mass index is 30.45 kg/(m^2).     ECOG FS: 1  Filed Weights   07/22/13 0856  Weight: 166 lb 8 oz (75.524 kg)   Sclerae unicteric, pupils equal and reactive Oropharynx clear and moist-- teeth in  good repair No cervical or supraclavicular adenopathy Lungs no rales or rhonchi Heart regular rate and rhythm, no murmur appreciated Abd soft, nontender, positive bowel sounds MSK no focal spinal tenderness to vigorous palpation and percussion Neuro: nonfocal, well oriented, positive affect Breasts: Deferred   LAB RESULTS: As of 06/27/2013 M Spike 0.55, total IgA 1910, lambda light chains 29.10   Lab Results  Component Value Date   WBC 8.9 07/22/2013   NEUTROABS 7.2* 07/22/2013   HGB 7.0* 07/22/2013   HCT 23.1* 07/22/2013   MCV 92.8 07/22/2013   PLT 203 07/22/2013      Chemistry      Component Value Date/Time   NA 145 06/27/2013 0921   NA 143 12/01/2011 0836   K 3.4* 06/27/2013 0921   K 4.0 12/01/2011 0836   CL 107 09/27/2012 6440  CL 105 12/01/2011 0836   CO2 24 06/27/2013 0921   CO2 29 12/01/2011 0836   BUN 6.8* 06/27/2013 0921   BUN 12 12/01/2011 0836   CREATININE 0.7 06/27/2013 0921   CREATININE 0.52 12/01/2011 0836      Component Value Date/Time   CALCIUM 9.3 06/27/2013 0921   CALCIUM 8.9 12/01/2011 0836   ALKPHOS 57 06/27/2013 0921   ALKPHOS 110 12/01/2011 0836   AST 26 06/27/2013 0921   AST 15 12/01/2011 0836   ALT 16 06/27/2013 0921   ALT 16 12/01/2011 0836   BILITOT 0.33 06/27/2013 0921   BILITOT 0.3 12/01/2011 0836     06/27/2013 Magnesium:  1.9  Phosphorus:  4.4 LDH:   240 CRP:    3.5   Kappa/lamba Light Chains   06/27/2013 05/30/2013 Kappa free light chain   0.03  0.03 Lambda free light chain  29.10  13.60  Kappa:Lambda ratio   0.00  0.00  SPEP & IFE with QIG  06/27/2013 05/30/2013 IgG, Serum    133  202  IgA     1910  1530 IgM, Serum    <5  <5 Total Protein, Serum   6.3  6.8 Albumin ELP    38.7  48.1 Alpha-1-Globulin   6.1  5.7 Alpha-2-Globulin   11.9  11.1 Beta Globulin    9.2  8.5  Beta 2     25.0  19.6 Gmma Globulin   9.1  7.0 M-Spike, %    0.55  0.47  Protein Electro, 24-Hour Urine 06/27/2013 05/30/2013 Total volume, urine   2400  2400 Collective  interval   24 hr  24 hr Total protein, urine   81  40 Total protein, urine/day  1944  960 Albumin    66.1  68.3 Alpha-1-globulin, urine  20.3  17.9 Alpha-2-globulin, urine  6.9  7.1 Beta globulin, urine   5.2  4.5 Gamma globulin, urine  1.5  2.2  Monoclonal band 1   None Det None Det Monoclonal band 2   None Det None Det     STUDIES: EXAM:  METASTATIC BONE SURVEY  COMPARISON: DG BONE SURVEY MET dated 02/03/2013; NM BONE WHOLE BODY  dated 01/26/2013  FINDINGS:  As noted on prior study multiple punctate lytic lesions are noted  throughout the axial and appendicular skeleton. These are most  prominent in the skull as noted on prior study. No new lesions  identified. Power port catheter is in good anatomic position. There  is stable cardiomegaly.  IMPRESSION:  1. Stable changes of multiple myeloma. No new lesions identified.  Lytic lesions are most obvious in the skull and again are unchanged.  2. Power port in good anatomic position.  3. Stable cardiomegaly.  Electronically Signed  By: Marcello Moores Register  On: 07/08/2013 09:53   ------------------------------------------------------------ Transthoracic Echocardiography  Patient: Laura Hester, Laura Hester MR #: [id number] Study Date: 07/15/2013 Gender: F Age: 83 Height: 157.5cm Weight: 75.8kg BSA: 1.28m2 Pt. Status: [facility]  [facility] ATTEND[facility] Croitoru, MMaineSONOGRAPHER WCoralyn HellingPERFORMING Chmg, Outpatient cc:  ------------------------------------------------------------ LV EF: 65% - 70%  ------------------------------------------------------------ History: PMH: Multiple myeloma. Acquired from the patient and from the patient's chart. Risk factors: Hypertension.  ------------------------------------------------------------ Study Conclusions  - Left ventricle: The cavity size was normal. Systolic function was vigorous. The estimated ejection fraction was in the  range of 65% to 70%. Wall motion was normal; there were no regional wall motion abnormalities. - Left atrium: The atrium was moderately to severely dilated. -  Atrial septum: No defect or patent foramen ovale was identified. - Pulmonary arteries: Systolic pressure was mildly increased. PA peak pressure: 62m Hg (S). - Line: A venous catheter was visualized in the superior vena cava, with its tip in the right atrium. No abnormal features noted.     ASSESSMENT: 58y.o.  Laura Hester woman presenting with anemia, found to have an IgA >6g, with an M-spike of 4.5 g (and a second M-spike of 0.4g), urine IFE showing IgA-lambda and lambda light chains, creatinine 0.77, calcium 10.0, albumin 3.1, and beta-2-microglobulin 5.51; with bone survey showing multiple myeloma lesions and bone marrow biopsy 09/01/2011 showing 75% myeloma cells. 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  (2) on prophylactic coumadin, discontinued as of 12/13/2011.  (3) zolendronic acid every 28 days, 09/19/2011 to 01/05/2012 (five doses)  (4) s/p neupogen mobilization October 2013, with a collection of 4.26 x10^6 CD34 positive cells  (5) status post melphalan at 200 mg/M2 on 02/23/2012, followed by autologous stem cell transplant the following day.  (6) on 06/07/2012 started maintenance bortezomib sq at 1.3 mg/M2 Q2w, with bactrim and acyclovir prophylaxis, the plan being to contuinue this for 2 years as per the HOVON trial  (7) zolendronic acid, given monthly, resumed June 2014  (8) disease progression noted with a rise in the IgA lambda fraction September 2014  (9)  [facility] to the high dose arm, receiving carfilzomib  on days 1, 2, 8, 9, 15, and 16 of every 28 day cycle. Cycle 1 given at a reduced  dose of 20 mg per meter square, with subsequent cycles given at 56 mg per meter square.  Regimen started 02/07/2013, discontinued as of 07/04/2013 (day 8 cycle 6) due to evidence of progression.  (10 enrolled in BMS 143, receiving elotuzumab weekly, with dexamethasone 40 mg weekly (taken on any non-elituzumab day) and lenalidomide at 25 mg/day for 21 days of each 28 day cycle, starting 08/01/2013  (a) prophylactic ASA 325 mg started with the first cycle  (b) prophylactic valacyclovir and trimethoprim-sulfamethoxazole continued as before  (c) received DTaP, Hep B and inactivated polio (Pediarix), H-flu and pneumococcal vacccines (HIb and PCV13) [facility]   [facility] to be repeated in June and October 2015   PLAN:  We spent approximately one hour today going over PDalton situation. She has a very good understanding of the nature of her myeloma and the fact that it is proving difficult to treat. [facility] that the current study may work for her. If not, [facility]  She has a very good understanding of the possible toxicities, side effects and complications of the agents she is going to be receiving. She has thoroughly reviewed the consent form for this study and signed it today. It is going to take u[place]aa few days to obtain the lenalidomide for her and so we have chosen April 20 as of the starting date. Today we also discussed the when necessary supportive medicines she will be receiving and the necessary prescriptions were entered as per protocol.  In addition I wrote her for tramadol to add to the Naprosyn if she finds this helps her become more active.  We have also set her up for blood transfusion to be received later today. She is going to see [place] again on April 20 and weekly thereafter. She will need repeat vaccinations in June and October of this year  Holli is very aware that her  tumor has proved difficult to treat. She knows the goal of treatment or cases control. She knows to call for any problems that may develop before her next visit here.  Chauncey Cruel, MD     07/22/2013

## 2013-07-22 NOTE — Patient Instructions (Signed)
Blood Transfusion Information WHAT IS A BLOOD TRANSFUSION? A transfusion is the replacement of blood or some of its parts. Blood is made up of multiple cells which provide different functions.  Red blood cells carry oxygen and are used for blood loss replacement.  White blood cells fight against infection.  Platelets control bleeding.  Plasma helps clot blood.  Other blood products are available for specialized needs, such as hemophilia or other clotting disorders. BEFORE THE TRANSFUSION  Who gives blood for transfusions?   You may be able to donate blood to be used at a later date on yourself (autologous donation).  Relatives can be asked to donate blood. This is generally not any safer than if you have received blood from a stranger. The same precautions are taken to ensure safety when a relative's blood is donated.  Healthy volunteers who are fully evaluated to make sure their blood is safe. This is blood bank blood. Transfusion therapy is the safest it has ever been in the practice of medicine. Before blood is taken from a donor, a complete history is taken to make sure that person has no history of diseases nor engages in risky social behavior (examples are intravenous drug use or sexual activity with multiple partners). The donor's travel history is screened to minimize risk of transmitting infections, such as malaria. The donated blood is tested for signs of infectious diseases, such as HIV and hepatitis. The blood is then tested to be sure it is compatible with you in order to minimize the chance of a transfusion reaction. If you or a relative donates blood, this is often done in anticipation of surgery and is not appropriate for emergency situations. It takes many days to process the donated blood. RISKS AND COMPLICATIONS Although transfusion therapy is very safe and saves many lives, the main dangers of transfusion include:   Getting an infectious disease.  Developing a  transfusion reaction. This is an allergic reaction to something in the blood you were given. Every precaution is taken to prevent this. The decision to have a blood transfusion has been considered carefully by your caregiver before blood is given. Blood is not given unless the benefits outweigh the risks. AFTER THE TRANSFUSION  Right after receiving a blood transfusion, you will usually feel much better and more energetic. This is especially true if your red blood cells have gotten low (anemic). The transfusion raises the level of the red blood cells which carry oxygen, and this usually causes an energy increase.  The nurse administering the transfusion will monitor you carefully for complications. HOME CARE INSTRUCTIONS  No special instructions are needed after a transfusion. You may find your energy is better. Speak with your caregiver about any limitations on activity for underlying diseases you may have. SEEK MEDICAL CARE IF:   Your condition is not improving after your transfusion.  You develop redness or irritation at the intravenous (IV) site. SEEK IMMEDIATE MEDICAL CARE IF:  Any of the following symptoms occur over the next 12 hours:  Shaking chills.  You have a temperature by mouth above 102 F (38.9 C), not controlled by medicine.  Chest, back, or muscle pain.  People around you feel you are not acting correctly or are confused.  Shortness of breath or difficulty breathing.  Dizziness and fainting.  You get a rash or develop hives.  You have a decrease in urine output.  Your urine turns a dark color or changes to pink, red, or brown. Any of the following   symptoms occur over the next 10 days:  You have a temperature by mouth above 102 F (38.9 C), not controlled by medicine.  Shortness of breath.  Weakness after normal activity.  The white part of the eye turns yellow (jaundice).  You have a decrease in the amount of urine or are urinating less often.  Your  urine turns a dark color or changes to pink, red, or brown. Document Released: 03/28/2000 Document Revised: 06/23/2011 Document Reviewed: 11/15/2007 ExitCare Patient Information 2014 [employer].  

## 2013-07-23 LAB — TYPE AND SCREEN
ABO/RH(D): A POS
ANTIBODY SCREEN: NEGATIVE
DAT, IGG: NEGATIVE
UNIT DIVISION: 0
Unit division: 0

## 2013-07-25 ENCOUNTER — Other Ambulatory Visit: Payer: Self-pay | Admitting: *Deleted

## 2013-07-25 MED ORDER — DEXAMETHASONE 4 MG PO TABS: 40.0000 mg | ORAL_TABLET | ORAL | Status: DC

## 2013-07-26 ENCOUNTER — Encounter: Payer: Self-pay | Admitting: *Deleted

## 2013-07-26 ENCOUNTER — Other Ambulatory Visit (HOSPITAL_BASED_OUTPATIENT_CLINIC_OR_DEPARTMENT_OTHER): Payer: BC Managed Care – PPO

## 2013-07-26 DIAGNOSIS — C9000 Multiple myeloma not having achieved remission: Secondary | ICD-10-CM

## 2013-07-26 LAB — COMPREHENSIVE METABOLIC PANEL (CC13)
ALBUMIN: 2 g/dL — AB (ref 3.5–5.0)
ALK PHOS: 59 U/L (ref 40–150)
ALT: 10 U/L (ref 0–55)
AST: 66 U/L — AB (ref 5–34)
Anion Gap: 13 mEq/L — ABNORMAL HIGH (ref 3–11)
BUN: 12.5 mg/dL (ref 7.0–26.0)
CALCIUM: 10.4 mg/dL (ref 8.4–10.4)
CHLORIDE: 108 meq/L (ref 98–109)
CO2: 22 mEq/L (ref 22–29)
Creatinine: 1.3 mg/dL — ABNORMAL HIGH (ref 0.6–1.1)
Glucose: 134 mg/dl (ref 70–140)
POTASSIUM: 3.8 meq/L (ref 3.5–5.1)
Sodium: 143 mEq/L (ref 136–145)
TOTAL PROTEIN: 10 g/dL — AB (ref 6.4–8.3)
Total Bilirubin: 0.71 mg/dL (ref 0.20–1.20)

## 2013-07-26 LAB — CBC WITH DIFFERENTIAL/PLATELET
BASO%: 0.7 % (ref 0.0–2.0)
Basophils Absolute: 0.1 10*3/uL (ref 0.0–0.1)
EOS ABS: 0.1 10*3/uL (ref 0.0–0.5)
EOS%: 1.6 % (ref 0.0–7.0)
HCT: 26.8 % — ABNORMAL LOW (ref 34.8–46.6)
HGB: 8.2 g/dL — ABNORMAL LOW (ref 11.6–15.9)
LYMPH%: 12.3 % — AB (ref 14.0–49.7)
MCH: 27.4 pg (ref 25.1–34.0)
MCHC: 30.6 g/dL — AB (ref 31.5–36.0)
MCV: 89.6 fL (ref 79.5–101.0)
MONO#: 0.4 10*3/uL (ref 0.1–0.9)
MONO%: 5.8 % (ref 0.0–14.0)
NEUT%: 79.6 % — ABNORMAL HIGH (ref 38.4–76.8)
NEUTROS ABS: 5.4 10*3/uL (ref 1.5–6.5)
PLATELETS: 156 10*3/uL (ref 145–400)
RBC: 2.99 10*6/uL — ABNORMAL LOW (ref 3.70–5.45)
RDW: 19.6 % — AB (ref 11.2–14.5)
WBC: 6.8 10*3/uL (ref 3.9–10.3)
lymph#: 0.8 10*3/uL — ABNORMAL LOW (ref 0.9–3.3)
nRBC: 1 % — ABNORMAL HIGH (ref 0–0)

## 2013-07-26 LAB — TECHNOLOGIST REVIEW

## 2013-07-26 NOTE — Progress Notes (Signed)
RECEIVED A FAX FROM [facility] CONCERNING A CONFIRMATION OF FACSIMILE RECEIPT FOR PT. REFERRAL. 

## 2013-07-26 NOTE — Progress Notes (Signed)
07/26/13 @ 9:40 am, Revlamid through [facility]:  Contacted [facility].  They have the Rx for Revlamid, but need clinical information for prior authorization.  Faxed office note from 07/22/13 and CBC from today.  Informed Dr. Virgie Dad nurse of same.

## 2013-07-28 NOTE — Progress Notes (Signed)
RECEIVED A FAX FROM [facility] CONCERNING A CONFIRMATION OF PRESCRIPTION SHIPMENT FOR REVLIMID ON 07/26/13.

## 2013-08-01 ENCOUNTER — Encounter: Payer: Self-pay | Admitting: *Deleted

## 2013-08-01 ENCOUNTER — Ambulatory Visit (HOSPITAL_BASED_OUTPATIENT_CLINIC_OR_DEPARTMENT_OTHER): Payer: BC Managed Care – PPO

## 2013-08-01 ENCOUNTER — Telehealth: Payer: Self-pay | Admitting: Oncology

## 2013-08-01 ENCOUNTER — Ambulatory Visit (HOSPITAL_BASED_OUTPATIENT_CLINIC_OR_DEPARTMENT_OTHER): Payer: BC Managed Care – PPO | Admitting: Oncology

## 2013-08-01 ENCOUNTER — Other Ambulatory Visit: Payer: Self-pay | Admitting: *Deleted

## 2013-08-01 VITALS — BP 139/72 | HR 121 | Temp 98.3°F | Resp 22 | Wt 165.2 lb

## 2013-08-01 VITALS — BP 121/60 | HR 106 | Temp 97.6°F | Resp 20

## 2013-08-01 DIAGNOSIS — D649 Anemia, unspecified: Secondary | ICD-10-CM

## 2013-08-01 DIAGNOSIS — C9000 Multiple myeloma not having achieved remission: Secondary | ICD-10-CM

## 2013-08-01 DIAGNOSIS — M545 Low back pain, unspecified: Secondary | ICD-10-CM

## 2013-08-01 DIAGNOSIS — J069 Acute upper respiratory infection, unspecified: Secondary | ICD-10-CM

## 2013-08-01 DIAGNOSIS — D630 Anemia in neoplastic disease: Secondary | ICD-10-CM

## 2013-08-01 DIAGNOSIS — J45909 Unspecified asthma, uncomplicated: Secondary | ICD-10-CM

## 2013-08-01 DIAGNOSIS — Z5112 Encounter for antineoplastic immunotherapy: Secondary | ICD-10-CM

## 2013-08-01 LAB — CBC WITH DIFFERENTIAL/PLATELET
BASO%: 0.1 % (ref 0.0–2.0)
BASOS ABS: 0 10*3/uL (ref 0.0–0.1)
EOS%: 0.1 % (ref 0.0–7.0)
Eosinophils Absolute: 0 10*3/uL (ref 0.0–0.5)
HEMATOCRIT: 19.2 % — AB (ref 34.8–46.6)
HEMOGLOBIN: 5.9 g/dL — AB (ref 11.6–15.9)
LYMPH%: 7.4 % — AB (ref 14.0–49.7)
MCH: 27.2 pg (ref 25.1–34.0)
MCHC: 30.7 g/dL — AB (ref 31.5–36.0)
MCV: 88.5 fL (ref 79.5–101.0)
MONO#: 0.3 10*3/uL (ref 0.1–0.9)
MONO%: 4.3 % (ref 0.0–14.0)
NEUT#: 6.8 10*3/uL — ABNORMAL HIGH (ref 1.5–6.5)
NEUT%: 88.1 % — ABNORMAL HIGH (ref 38.4–76.8)
PLATELETS: 106 10*3/uL — AB (ref 145–400)
RBC: 2.17 10*6/uL — ABNORMAL LOW (ref 3.70–5.45)
RDW: 20.1 % — ABNORMAL HIGH (ref 11.2–14.5)
WBC: 7.8 10*3/uL (ref 3.9–10.3)
lymph#: 0.6 10*3/uL — ABNORMAL LOW (ref 0.9–3.3)

## 2013-08-01 LAB — TECHNOLOGIST REVIEW

## 2013-08-01 MED ORDER — ACETAMINOPHEN 325 MG PO TABS: 650.0000 mg | ORAL_TABLET | Freq: Once | ORAL | Status: DC

## 2013-08-01 MED ORDER — SODIUM CHLORIDE 0.9 % IJ SOLN
10.0000 mL | INTRAMUSCULAR | Status: DC | PRN
  Administered 2013-08-01: 10 mL
  Filled 2013-08-01: qty 10

## 2013-08-01 MED ORDER — ACETAMINOPHEN 325 MG PO TABS
ORAL_TABLET | ORAL | Status: AC
  Filled 2013-08-01: qty 2

## 2013-08-01 MED ORDER — FLUCONAZOLE 100 MG PO TABS: 100.0000 mg | ORAL_TABLET | Freq: Every day | ORAL | Status: DC

## 2013-08-01 MED ORDER — ASPIRIN 325 MG PO TABS: 325.0000 mg | ORAL_TABLET | Freq: Every day | ORAL | Status: DC

## 2013-08-01 MED ORDER — LORAZEPAM 0.5 MG PO TABS: 0.5000 mg | ORAL_TABLET | Freq: Four times a day (QID) | ORAL | Status: DC | PRN

## 2013-08-01 MED ORDER — HEPARIN SOD (PORK) LOCK FLUSH 100 UNIT/ML IV SOLN
500.0000 [IU] | Freq: Once | INTRAVENOUS | Status: AC | PRN
  Administered 2013-08-01: 500 [IU]
  Filled 2013-08-01: qty 5

## 2013-08-01 MED ORDER — DEXAMETHASONE SODIUM PHOSPHATE 10 MG/ML IJ SOLN
8.0000 mg | Freq: Once | INTRAMUSCULAR | Status: AC
  Administered 2013-08-01: 8 mg via INTRAVENOUS

## 2013-08-01 MED ORDER — DEXAMETHASONE 4 MG PO TABS: ORAL_TABLET | ORAL | Status: DC

## 2013-08-01 MED ORDER — FAMOTIDINE IN NACL 20-0.9 MG/50ML-% IV SOLN
INTRAVENOUS | Status: AC
  Filled 2013-08-01: qty 50

## 2013-08-01 MED ORDER — DIPHENHYDRAMINE HCL 25 MG PO CAPS
50.0000 mg | ORAL_CAPSULE | Freq: Once | ORAL | Status: AC
  Administered 2013-08-01: 50 mg via ORAL

## 2013-08-01 MED ORDER — SODIUM CHLORIDE 0.9 % IV SOLN
Freq: Once | INTRAVENOUS | Status: AC
  Administered 2013-08-01: 09:00:00 via INTRAVENOUS

## 2013-08-01 MED ORDER — SODIUM CHLORIDE 0.9 % IV SOLN
10.0000 mg/kg | Freq: Once | INTRAVENOUS | Status: AC
  Administered 2013-08-01: 755 mg via INTRAVENOUS
  Filled 2013-08-01: qty 30.2

## 2013-08-01 MED ORDER — ACETAMINOPHEN 325 MG PO TABS
650.0000 mg | ORAL_TABLET | Freq: Once | ORAL | Status: AC
  Administered 2013-08-01: 650 mg via ORAL

## 2013-08-01 MED ORDER — FAMOTIDINE IN NACL 20-0.9 MG/50ML-% IV SOLN
20.0000 mg | Freq: Once | INTRAVENOUS | Status: AC
  Administered 2013-08-01: 20 mg via INTRAVENOUS

## 2013-08-01 MED ORDER — DIPHENHYDRAMINE HCL 25 MG PO CAPS
ORAL_CAPSULE | ORAL | Status: AC
  Filled 2013-08-01: qty 2

## 2013-08-01 MED ORDER — DIPHENHYDRAMINE HCL 25 MG PO CAPS: 25.0000 mg | ORAL_CAPSULE | Freq: Once | ORAL | Status: DC

## 2013-08-01 MED ORDER — DEXAMETHASONE SODIUM PHOSPHATE 10 MG/ML IJ SOLN
INTRAMUSCULAR | Status: AC
  Filled 2013-08-01: qty 1

## 2013-08-01 MED ORDER — ONDANSETRON HCL 8 MG PO TABS: 8.0000 mg | ORAL_TABLET | Freq: Two times a day (BID) | ORAL | Status: DC | PRN

## 2013-08-01 MED ORDER — PROCHLORPERAZINE MALEATE 10 MG PO TABS: 10.0000 mg | ORAL_TABLET | Freq: Four times a day (QID) | ORAL | Status: DC | PRN

## 2013-08-01 NOTE — Progress Notes (Signed)
ID: Laura Hester   DOB: 10-11-1955  MR#: [id number]  CSN#:[id number]  PCP: Gara Kroner, MD GYN: Princess Bruins, MD SU: OTHER MD: Precious Haws, MD at [facility] (fax # [phone]), Rae Mar  CHIEF COMPLAINT:  Multiple Myeloma   HISTORY OF PRESENT ILLNESS: Laura Hester has a history of iron deficiency anemia secondary to menorrhagia. However, as this persisted despite near-total cessation of her menses and despite iron supplementation, she was referred to Dr. Teena Irani for GI evaluation. He found the patient's Hb to have decreased from 9.17 June 2011 to 8.3 07/29/2011. MCV was 95.9, ferritin 93.3, with a normal folate and negative ANA. Guaiacs were negative. Creatinine was 0.77.  t-transglutaminase IgA was normal @ 2, but the total IgA was 6.9 g. Accordingly on 08/04/2011 an SPEP was obtained, showing an M-spike of 4.5 g, with a secondary M-spike of 0.4 g. Total protein was 10.0 with albumin 3.2, calcium 10.2. Bone survey 08/26/2011 showed multiple bone lesions consistent with myeloma and bone marrow biopsy 09/01/2011 confirmed the diagnosis with 75% myeloma cells in the marrow.   Laura Hester' subsequent history is detailed below.  INTERVAL HISTORY: Laura Hester returns today for followup of her multiple myeloma accompanied by her husband Laura Hester. She has started her a dexamethasone and will start her lenalidomide. She received her first dose of elotuzumab today (day 1 cycle 1)  REVIEW OF SYSTEMS: Reet as a little dry cough, but no significant symptoms of allergies. She has had no fever, rash, or bleeding. She sleeps on 1 pillow. She does take naps during the day. She feels a transfusion she received last week was helpful. Her pain is controlled on nonsteroidals with a rare tramadol. The tramadol makes her feel a little "t". A detailed review of systems today was otherwise noncontributory  PAST MEDICAL HISTORY: Past Medical History  Diagnosis Date  . Fibrocystic breast disease   . Allergic  rhinitis   . HTN (hypertension)     mild/ observation only  . Prediabetes   . SUI (stress urinary incontinence, female)   . Overweight (BMI 25.0-29.9)   . Multiple myeloma, stage 3 08/20/11 dx  . Anemia     resolved  . Cancer   . Arthritis     PAST SURGICAL HISTORY: Past Surgical History  Procedure Laterality Date  . Cholecystectomy  1995  . Tonsillectomy and adenoidectomy    . Carpal tunnel release      FAMILY HISTORY Family History  Problem Relation Age of Onset  . Heart disease Father   . Breast cancer Mother   . Heart disease Mother   . Diabetes Paternal Grandfather   . Hypertension Maternal Grandmother   . Hypertension Brother   The patient's parents are in their mid-49s.Her mother has a remote history of breast cancer. She has two brothers, one her twin; no sisters. There is no other history of cancer in the family to her knowledge  GYNECOLOGIC HISTORY: Menarche age 59, first live birth age 34, [place] P18. Last period was January 2013, before that May 2012.  SOCIAL HISTORY: (Updated  07/04/2013) She is a Forensic scientist and taught kindergarten until 2012. Her husband Laura Hester, graduated from [employer] and works in Technical brewer.  Daughter Laura Hester works for [employer], married in 2013. Daughter Laura Hester studies and works in [place] and comes home every other week and The patient attends the [employer]: Not in place  HEALTH MAINTENANCE: (Updated 07/04/2013) History  Substance Use Topics  . Smoking status:  Never Smoker   . Smokeless tobacco: Never Used  . Alcohol Use: No     Colonoscopy: 2009/[facility]  PAP: March 2014, Dr. Dellis Filbert  Bone density: never  Lipid panel: [place]/ "excellent"  Mammography: June of 2014    Allergies  Allergen Reactions  . Wellbutrin [Bupropion Hcl] Hives    Current Outpatient Prescriptions  Medication Sig Dispense Refill  . dexamethasone (DECADRON) 4 MG tablet Take 10 tablets (40 mg total) by  mouth once a week. Take day before IV chemo  40 tablet  6  . lenalidomide (REVLIMID) 25 MG capsule Take 1 capsule (25 mg total) by mouth daily.  21 capsule  6  . lidocaine-prilocaine (EMLA) cream Apply topically as needed.  30 g  3  . lidocaine-prilocaine (EMLA) cream Apply topically.      Marland Kitchen LORazepam (ATIVAN) 0.5 MG tablet Take 1 tablet (0.5 mg total) by mouth at bedtime as needed for anxiety.  30 tablet  0  . naproxen sodium (ANAPROX) 220 MG tablet Take 1 tablet (220 mg total) by mouth 2 (two) times daily with a meal.  60 tablet  6  . omeprazole (PRILOSEC) 20 MG capsule Take 1 capsule (20 mg total) by mouth daily.  30 capsule  12  . Potassium Chloride ER 20 MEQ TBCR TAKE 1 TABLET (20 MEQ TOTAL) BY MOUTH DAILY.  90 tablet  0  . Potassium Chloride ER 20 MEQ TBCR TAKE 1 TABLET (20 MEQ TOTAL) BY MOUTH DAILY.      Marland Kitchen sulfamethoxazole-trimethoprim (BACTRIM DS) 800-160 MG per tablet Take 2 tablets by mouth 2 (two) times a week. Monday  AND THURSDAY  30 tablet  4  . traMADol (ULTRAM) 50 MG tablet Take 1 tablet (50 mg total) by mouth every 6 (six) hours as needed.  30 tablet  3  . valACYclovir (VALTREX) 500 MG tablet Take 1 tablet (500 mg total) by mouth daily.  30 tablet  12   No current facility-administered medications for this visit.   Facility-Administered Medications Ordered in Other Visits  Medication Dose Route Frequency Provider Last Rate Last Dose  . sodium chloride 0.9 % injection 10 mL  10 mL Intracatheter PRN Amy Milda Smart, PA-C   10 mL at 05/30/13 1640   OBJECTIVE: Middle-aged white woman in no acute distress Filed Vitals:   08/01/13 0815  BP: 139/72  Pulse: 121  Temp: 98.3 F (36.8 C)  Resp: 22     Body mass index is 30.21 kg/(m^2).     ECOG FS: 1  Filed Weights   08/01/13 0815  Weight: 165 lb 3.2 oz (74.934 kg)   Sclerae unicteric, pupils equal and reactive Oropharynx clear and moist-- teeth in good repair No cervical or supraclavicular adenopathy Lungs no rales or  rhonchi Heart regular rate and rhythm, no murmur appreciated Abd soft, nontender, positive bowel sounds MSK no focal spinal tenderness to vigorous palpation and percussion Neuro: nonfocal, well oriented, positive affect Breasts: Deferred   LAB RESULTS:  Results for JENNAYA, POGUE (MRN [id number]) as of 08/01/2013 08:14  Ref. Range 03/07/2013 11:09 04/01/2013 08:46 04/29/2013 12:08 05/30/2013 12:16 06/27/2013 14:05  M-SPIKE, % No range found 1.17 0.30 0.33 0.47 0.55  Results for LAVAEH, BAU (MRN [id number]) as of 08/01/2013 08:14  Ref. Range 03/07/2013 11:09 04/01/2013 08:46 04/29/2013 12:08 05/30/2013 12:16 06/27/2013 14:05  Lambda Free Lght Chn Latest Range: 0.57-2.63 mg/dL 31.80 (H) 8.45 (H) 8.53 (H) 13.60 (H) 29.10 (H)    Lab Results  Component Value Date  WBC 6.8 07/26/2013   NEUTROABS 5.4 07/26/2013   HGB 8.2* 07/26/2013   HCT 26.8* 07/26/2013   MCV 89.6 07/26/2013   PLT 156 07/26/2013      Chemistry      Component Value Date/Time   NA 143 07/26/2013 0907   NA 143 12/01/2011 0836   K 3.8 07/26/2013 0907   K 4.0 12/01/2011 0836   CL 107 09/27/2012 0842   CL 105 12/01/2011 0836   CO2 22 07/26/2013 0907   CO2 29 12/01/2011 0836   BUN 12.5 07/26/2013 0907   BUN 12 12/01/2011 0836   CREATININE 1.3* 07/26/2013 0907   CREATININE 0.52 12/01/2011 0836      Component Value Date/Time   CALCIUM 10.4 07/26/2013 0907   CALCIUM 8.9 12/01/2011 0836   ALKPHOS 59 07/26/2013 0907   ALKPHOS 110 12/01/2011 0836   AST 66* 07/26/2013 0907   AST 15 12/01/2011 0836   ALT 10 07/26/2013 0907   ALT 16 12/01/2011 0836   BILITOT 0.71 07/26/2013 0907   BILITOT 0.3 12/01/2011 0836     06/27/2013 Magnesium:  1.9  Phosphorus:  4.4 LDH:   240 CRP:    3.5   Kappa/lamba Light Chains   06/27/2013 05/30/2013 Kappa free light chain   0.03  0.03 Lambda free light chain  29.10  13.60  Kappa:Lambda ratio   0.00  0.00  SPEP & IFE with QIG  06/27/2013 05/30/2013 IgG,  Serum    133  202  IgA     1910  1530 IgM, Serum    <5  <5 Total Protein, Serum   6.3  6.8 Albumin ELP    38.7  48.1 Alpha-1-Globulin   6.1  5.7 Alpha-2-Globulin   11.9  11.1 Beta Globulin    9.2  8.5  Beta 2     25.0  19.6 Gmma Globulin   9.1  7.0 M-Spike, %    0.55  0.47  Protein Electro, 24-Hour Urine 06/27/2013 05/30/2013 Total volume, urine   2400  2400 Collective interval   24 hr  24 hr Total protein, urine   81  40 Total protein, urine/day  1944  960 Albumin    66.1  68.3 Alpha-1-globulin, urine  20.3  17.9 Alpha-2-globulin, urine  6.9  7.1 Beta globulin, urine   5.2  4.5 Gamma globulin, urine  1.5  2.2  Monoclonal band 1   None Det None Det Monoclonal band 2   None Det None Det     STUDIES: Dg Bone Survey Met  07/08/2013   CLINICAL DATA:  Multiple myeloma.  EXAM: METASTATIC BONE SURVEY  COMPARISON:  DG BONE SURVEY MET dated 02/03/2013; NM BONE WHOLE BODY dated 01/26/2013  FINDINGS: As noted on prior study multiple punctate lytic lesions are noted throughout the axial and appendicular skeleton. These are most prominent in the skull as noted on prior study. No new lesions identified. Power port catheter is in good anatomic position. There is stable cardiomegaly.  IMPRESSION: 1. Stable changes of multiple myeloma. No new lesions identified. Lytic lesions are most obvious in the skull and again are unchanged.  2.  Power port in good anatomic position.  3.  Stable cardiomegaly.   Electronically Signed   By: Marcello Moores  Register   On: 07/08/2013 09:53    ASSESSMENT: 58 y.o.  [place] woman presenting with anemia, found to have an IgA >6g, with an M-spike of 4.5 g (and a second M-spike of 0.4g), urine IFE showing IgA-lambda and lambda light  chains, creatinine 0.77, calcium 10.0, albumin 3.1, and beta-2-microglobulin 5.51; with bone survey showing multiple myeloma lesions and bone marrow biopsy 09/01/2011 showing 75% myeloma cells. 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  (2) on prophylactic coumadin, discontinued as of 12/13/2011.  (3) zolendronic acid every 28 days, 09/19/2011 to 01/05/2012 (five doses)  (4) s/p neupogen mobilization October 2013, with a collection of 4.26 x10^6 CD34 positive cells  (5) status post melphalan at 200 mg/M2 on 02/23/2012, followed by autologous stem cell transplant the following day.  (6) on 06/07/2012 started maintenance bortezomib sq at 1.3 mg/M2 Q2w, with bactrim and acyclovir prophylaxis, the plan being to contuinue this for 2 years as per the HOVON trial  (7) zolendronic acid, given monthly, resumed June 2014  (8) disease progression noted with a rise in the IgA lambda fraction September 2014  (9)  treated according to the [facility] S1304 randomized to the high dose arm, receiving carfilzomib  on days 1, 2, 8, 9, 15, and 16 of every 28 day cycle. Cycle 1 given at a reduced dose of 20 mg per meter square, with subsequent cycles given at 56 mg per meter square.  Regimen started 02/07/2013, discontinued as of 07/04/2013 (day 8 cycle 6) due to evidence of progression.  (10 enrolled in BMS 143, receiving elotuzumab weekly, with dexamethasone 40 mg weekly (taken on any non-elituzumab day) and lenalidomide at 25 mg/day for 21 days of each 28 day cycle, starting 08/01/2013  (a) prophylactic ASA 325 mg started with the first cycle  (b) prophylactic valacyclovir and trimethoprim-sulfamethoxazole continued as before  (c) received DTaP, Hep B and inactivated polio (Pediarix), H-flu and pneumococcal vacccines (HIb and PCV13) on   07/14/2013 at [facility]; to be repeated in June and October 2015   PLAN:  Annel is ready to start treatment today. I am making her a return appointment with me in one week just to make sure she is tolerating things well your we are going  to hold her zolendronate until next week or perhaps if she needs a transfusion later this week we could do it at that time, but I would prefer not to confuse things with his new drug that she is getting today.  She does have thrush and I am putting in a prescription for Diflucan for her we discussed she should take that. Otherwise she knows to call for any problems that may develop before her next visit here.   Chauncey Cruel, MD     08/01/2013

## 2013-08-01 NOTE — Progress Notes (Signed)
08/01/13 @ 8:45 am, BMS [id number], Cycle 1, Day 1:  Laura Hester, accompanied by her husband, into the [facility] to see Dr. Jana Hakim and to receive her first dose of elotuzumab.  She reports having taken dexamethasone 28 mg yesterday at noon by mouth.  She has the Revlimid on hand will take her first dose at dinner tonight (this is how she took Revlimid previously and would like to use the same treatment schedule).  She is doing well with minor complaints.  Her weight was assessed at 74.9 kg today and that is the weight that will be used for this cycle. Spoke at length with Elray Buba, RN in the infusion area.  She is the nurse that will be treating Laura Hester today.  Provided her with written information of the study drug, a document for grading infusion reactions using CTC AE v. 3.0, a document for treating and managing elotuzumab in the event of an infusion reaction (taken directly from the protocol), and a sign with the directions for administration of pre-medications and the requirement of their having been on board for at least 45 minutes, but no greater than 90 minutes.  The sign has a detailed titration schedule for C1D1, and directions for the use of a 0.22 micron filter.  Advised her to please call me immediately if there should be an infusion reaction.  All of the nurse's questions were answered to her satisfaction. Spoke with Carolynne Edouard, Pharm D regarding the use of today's weight for dose calculation.  She confirmed that the concentration falls within the recommended range per the latest version of the pharmacy manual (03-APR-15).  Provided Melissa with the vial assignment that was made through [employer].  08/08/13 @ 11:45 am, BMS [id number], Cycle 1, Day 8:  Laura Hester into the [facility] for labs, to see Dr. Jana Hakim and to receive treatment with elotuzumab. She is feeling stronger today and ready to proceed with treatment.  She reports having taken dexamethasone 28 mg po yesterday at 4:30 pm. She  continues to take Revlimid 25 mg daily, along with ASA 325 mg.  Dr. Jana Hakim wants her to proceed with treatment.  He ordered an SPEP and serum light chain today and will do those about every 3-4 weeks.  He will see her in two weeks with weekly CBCs.  Spoke at length with Margaretmary Eddy, RN in the infusion room about the drug itself, how to titrate today's dose, managing infusion reactions, the use of a 0.22 micron filter, and the timing of premeds.  Provided her with written materials regarding all of the above.  Obtained vial assignment via [employer] and provided Lovett Sox, PharmD with that information.  08/15/13 @ 9:25 am, BMS [id number], Cycle 1, Day 15:  Laura Hester into the [facility] for labs and infusion of elotuzumab.  She is doing very well, feeling more energetic, color has improved.  The oral thrush has completely resolved.  She reports having taken dexamethasone 28 mg at noon yesterday, and takes her Revlimid each evening.  We reviewed the timing of the Revlimid she was reminded that next Sunday, Aug 21, 2013 will be her last dose this cycle.   Spoke with Delle Reining O'Flaherty in the infusion room about the treatment for today.  Laura Hester received the dose at the maximum rate at the end of Day 8 and had no infusion reaction, therefore she will receive the entire dose at 5cc/min, unless there is a reaction today.  The nurse was given a sign for the  pump with details of the rate, the timing of premeds, and the use of a 0.22 micron filter.

## 2013-08-01 NOTE — Telephone Encounter (Signed)
, °

## 2013-08-01 NOTE — Progress Notes (Signed)
1130 -  Pt complained of feeling " hot ".  No facial flushing noted. Pt still A&O x 3.  Pt talking to [facility], Nutritional therapist.  Temp checked  98.5.  Baker Janus aware.  Pt stated she experiences restlessness due to Benadryl; however, pt stated she is also restless at home. 1400  -  Pt completed Elotuzumab infusion without problems.

## 2013-08-01 NOTE — Patient Instructions (Signed)
[facility] Discharge Instructions for Patients Receiving Chemotherapy  Today you received the following chemotherapy agents :  Elotuzmab.  To help prevent nausea and vomiting after your treatment, we encourage you to take your nausea medication as instructed by your physician.   If you develop nausea and vomiting that is not controlled by your nausea medication, call the clinic.   BELOW ARE SYMPTOMS THAT SHOULD BE REPORTED IMMEDIATELY:  *FEVER GREATER THAN 100.5 F  *CHILLS WITH OR WITHOUT FEVER  NAUSEA AND VOMITING THAT IS NOT CONTROLLED WITH YOUR NAUSEA MEDICATION  *UNUSUAL SHORTNESS OF BREATH  *UNUSUAL BRUISING OR BLEEDING  TENDERNESS IN MOUTH AND THROAT WITH OR WITHOUT PRESENCE OF ULCERS  *URINARY PROBLEMS  *BOWEL PROBLEMS  UNUSUAL RASH Items with * indicate a potential emergency and should be followed up as soon as possible.  Feel free to call the clinic you have any questions or concerns. The clinic phone number is [phone].

## 2013-08-02 ENCOUNTER — Other Ambulatory Visit: Payer: BC Managed Care – PPO

## 2013-08-02 ENCOUNTER — Ambulatory Visit (HOSPITAL_BASED_OUTPATIENT_CLINIC_OR_DEPARTMENT_OTHER): Payer: BC Managed Care – PPO

## 2013-08-02 ENCOUNTER — Telehealth: Payer: Self-pay | Admitting: *Deleted

## 2013-08-02 VITALS — BP 112/68 | HR 105 | Temp 97.4°F | Resp 18

## 2013-08-02 DIAGNOSIS — D649 Anemia, unspecified: Secondary | ICD-10-CM

## 2013-08-02 LAB — PREPARE RBC (CROSSMATCH)

## 2013-08-02 MED ORDER — SODIUM CHLORIDE 0.9 % IV SOLN
250.0000 mL | Freq: Once | INTRAVENOUS | Status: AC
  Administered 2013-08-02: 250 mL via INTRAVENOUS

## 2013-08-02 MED ORDER — SODIUM CHLORIDE 0.9 % IJ SOLN
10.0000 mL | INTRAMUSCULAR | Status: AC | PRN
  Administered 2013-08-02: 10 mL
  Filled 2013-08-02: qty 10

## 2013-08-02 MED ORDER — ACETAMINOPHEN 325 MG PO TABS
ORAL_TABLET | ORAL | Status: AC
  Filled 2013-08-02: qty 2

## 2013-08-02 MED ORDER — DIPHENHYDRAMINE HCL 25 MG PO CAPS
25.0000 mg | ORAL_CAPSULE | Freq: Once | ORAL | Status: AC
  Administered 2013-08-02: 25 mg via ORAL

## 2013-08-02 MED ORDER — ACETAMINOPHEN 325 MG PO TABS
650.0000 mg | ORAL_TABLET | Freq: Once | ORAL | Status: AC
  Administered 2013-08-02: 650 mg via ORAL

## 2013-08-02 MED ORDER — HEPARIN SOD (PORK) LOCK FLUSH 100 UNIT/ML IV SOLN
500.0000 [IU] | Freq: Every day | INTRAVENOUS | Status: AC | PRN
  Administered 2013-08-02: 500 [IU]
  Filled 2013-08-02: qty 5

## 2013-08-02 MED ORDER — DIPHENHYDRAMINE HCL 25 MG PO CAPS
ORAL_CAPSULE | ORAL | Status: AC
  Filled 2013-08-02: qty 1

## 2013-08-02 NOTE — Patient Instructions (Signed)
Blood Transfusion Information WHAT IS A BLOOD TRANSFUSION? A transfusion is the replacement of blood or some of its parts. Blood is made up of multiple cells which provide different functions.  Red blood cells carry oxygen and are used for blood loss replacement.  White blood cells fight against infection.  Platelets control bleeding.  Plasma helps clot blood.  Other blood products are available for specialized needs, such as hemophilia or other clotting disorders. BEFORE THE TRANSFUSION  Who gives blood for transfusions?   You may be able to donate blood to be used at a later date on yourself (autologous donation).  Relatives can be asked to donate blood. This is generally not any safer than if you have received blood from a stranger. The same precautions are taken to ensure safety when a relative's blood is donated.  Healthy volunteers who are fully evaluated to make sure their blood is safe. This is blood bank blood. Transfusion therapy is the safest it has ever been in the practice of medicine. Before blood is taken from a donor, a complete history is taken to make sure that person has no history of diseases nor engages in risky social behavior (examples are intravenous drug use or sexual activity with multiple partners). The donor's travel history is screened to minimize risk of transmitting infections, such as malaria. The donated blood is tested for signs of infectious diseases, such as HIV and hepatitis. The blood is then tested to be sure it is compatible with you in order to minimize the chance of a transfusion reaction. If you or a relative donates blood, this is often done in anticipation of surgery and is not appropriate for emergency situations. It takes many days to process the donated blood. RISKS AND COMPLICATIONS Although transfusion therapy is very safe and saves many lives, the main dangers of transfusion include:   Getting an infectious disease.  Developing a  transfusion reaction. This is an allergic reaction to something in the blood you were given. Every precaution is taken to prevent this. The decision to have a blood transfusion has been considered carefully by your caregiver before blood is given. Blood is not given unless the benefits outweigh the risks. AFTER THE TRANSFUSION  Right after receiving a blood transfusion, you will usually feel much better and more energetic. This is especially true if your red blood cells have gotten low (anemic). The transfusion raises the level of the red blood cells which carry oxygen, and this usually causes an energy increase.  The nurse administering the transfusion will monitor you carefully for complications. HOME CARE INSTRUCTIONS  No special instructions are needed after a transfusion. You may find your energy is better. Speak with your caregiver about any limitations on activity for underlying diseases you may have. SEEK MEDICAL CARE IF:   Your condition is not improving after your transfusion.  You develop redness or irritation at the intravenous (IV) site. SEEK IMMEDIATE MEDICAL CARE IF:  Any of the following symptoms occur over the next 12 hours:  Shaking chills.  You have a temperature by mouth above 102 F (38.9 C), not controlled by medicine.  Chest, back, or muscle pain.  People around you feel you are not acting correctly or are confused.  Shortness of breath or difficulty breathing.  Dizziness and fainting.  You get a rash or develop hives.  You have a decrease in urine output.  Your urine turns a dark color or changes to pink, red, or brown. Any of the following   symptoms occur over the next 10 days:  You have a temperature by mouth above 102 F (38.9 C), not controlled by medicine.  Shortness of breath.  Weakness after normal activity.  The white part of the eye turns yellow (jaundice).  You have a decrease in the amount of urine or are urinating less often.  Your  urine turns a dark color or changes to pink, red, or brown. Document Released: 03/28/2000 Document Revised: 06/23/2011 Document Reviewed: 11/15/2007 ExitCare Patient Information 2014 [employer].  

## 2013-08-02 NOTE — Telephone Encounter (Signed)
Spoke with pt in infusion room receiving blood transfusion today.  Pt stated she was still restless at home and through the night which pt contributed to taking steroids.  Per husband, pt has good appetite, drinking lots of fluids as tolerated.  Denied nausea/vomiting.  Denied pain.  Pt had started taking Revlimid and ASA yesterday evening.  Stated has problems with constipation - which pt thinks from Revlimid.  Stated taking Senokot; pt knows how to manage constipation - drink prune juice with good results.  Offered pt Breeze drink.  Stated bladder functions fine.  Pt understood to call office with any new issues.

## 2013-08-03 LAB — TYPE AND SCREEN
ABO/RH(D): A POS
Antibody Screen: NEGATIVE
UNIT DIVISION: 0
Unit division: 0

## 2013-08-08 ENCOUNTER — Ambulatory Visit (HOSPITAL_BASED_OUTPATIENT_CLINIC_OR_DEPARTMENT_OTHER): Payer: BC Managed Care – PPO

## 2013-08-08 ENCOUNTER — Ambulatory Visit (HOSPITAL_BASED_OUTPATIENT_CLINIC_OR_DEPARTMENT_OTHER): Payer: BC Managed Care – PPO | Admitting: Oncology

## 2013-08-08 ENCOUNTER — Other Ambulatory Visit: Payer: Self-pay | Admitting: *Deleted

## 2013-08-08 ENCOUNTER — Other Ambulatory Visit (HOSPITAL_BASED_OUTPATIENT_CLINIC_OR_DEPARTMENT_OTHER): Payer: BC Managed Care – PPO

## 2013-08-08 VITALS — BP 136/68 | HR 99 | Temp 98.0°F | Resp 18 | Ht 62.0 in | Wt 159.2 lb

## 2013-08-08 DIAGNOSIS — Z5112 Encounter for antineoplastic immunotherapy: Secondary | ICD-10-CM

## 2013-08-08 DIAGNOSIS — C9000 Multiple myeloma not having achieved remission: Secondary | ICD-10-CM

## 2013-08-08 DIAGNOSIS — D696 Thrombocytopenia, unspecified: Secondary | ICD-10-CM

## 2013-08-08 DIAGNOSIS — Z95828 Presence of other vascular implants and grafts: Secondary | ICD-10-CM

## 2013-08-08 DIAGNOSIS — D630 Anemia in neoplastic disease: Secondary | ICD-10-CM

## 2013-08-08 LAB — CBC WITH DIFFERENTIAL/PLATELET
BASO%: 0.6 % (ref 0.0–2.0)
Basophils Absolute: 0 10*3/uL (ref 0.0–0.1)
EOS ABS: 0.1 10*3/uL (ref 0.0–0.5)
EOS%: 1.7 % (ref 0.0–7.0)
HEMATOCRIT: 29 % — AB (ref 34.8–46.6)
HEMOGLOBIN: 9.2 g/dL — AB (ref 11.6–15.9)
LYMPH#: 0.7 10*3/uL — AB (ref 0.9–3.3)
LYMPH%: 9.6 % — AB (ref 14.0–49.7)
MCH: 27.1 pg (ref 25.1–34.0)
MCHC: 31.7 g/dL (ref 31.5–36.0)
MCV: 85.5 fL (ref 79.5–101.0)
MONO#: 0.2 10*3/uL (ref 0.1–0.9)
MONO%: 2.9 % (ref 0.0–14.0)
NEUT%: 85.2 % — ABNORMAL HIGH (ref 38.4–76.8)
NEUTROS ABS: 6.1 10*3/uL (ref 1.5–6.5)
Platelets: 45 10*3/uL — ABNORMAL LOW (ref 145–400)
RBC: 3.39 10*6/uL — ABNORMAL LOW (ref 3.70–5.45)
RDW: 19.1 % — ABNORMAL HIGH (ref 11.2–14.5)
WBC: 7.2 10*3/uL (ref 3.9–10.3)
nRBC: 1 % — ABNORMAL HIGH (ref 0–0)

## 2013-08-08 LAB — COMPREHENSIVE METABOLIC PANEL (CC13)
ALK PHOS: 94 U/L (ref 40–150)
ALT: 65 U/L — ABNORMAL HIGH (ref 0–55)
ANION GAP: 12 meq/L — AB (ref 3–11)
AST: 32 U/L (ref 5–34)
Albumin: 1.7 g/dL — ABNORMAL LOW (ref 3.5–5.0)
BILIRUBIN TOTAL: 0.86 mg/dL (ref 0.20–1.20)
BUN: 40.7 mg/dL — ABNORMAL HIGH (ref 7.0–26.0)
CO2: 16 mEq/L — ABNORMAL LOW (ref 22–29)
CREATININE: 0.9 mg/dL (ref 0.6–1.1)
Calcium: 8.5 mg/dL (ref 8.4–10.4)
Chloride: 110 mEq/L — ABNORMAL HIGH (ref 98–109)
GLUCOSE: 194 mg/dL — AB (ref 70–140)
Potassium: 3.6 mEq/L (ref 3.5–5.1)
SODIUM: 139 meq/L (ref 136–145)
TOTAL PROTEIN: 8.2 g/dL (ref 6.4–8.3)

## 2013-08-08 MED ORDER — DEXAMETHASONE SODIUM PHOSPHATE 10 MG/ML IJ SOLN
INTRAMUSCULAR | Status: AC
  Filled 2013-08-08: qty 1

## 2013-08-08 MED ORDER — SODIUM CHLORIDE 0.9 % IJ SOLN
10.0000 mL | INTRAMUSCULAR | Status: DC | PRN
  Administered 2013-08-08 (×2): 10 mL via INTRAVENOUS
  Filled 2013-08-08: qty 10

## 2013-08-08 MED ORDER — OXYCODONE-ACETAMINOPHEN 5-325 MG PO TABS: 1.0000 | ORAL_TABLET | Freq: Once | ORAL | Status: DC

## 2013-08-08 MED ORDER — ACETAMINOPHEN 325 MG PO TABS
650.0000 mg | ORAL_TABLET | Freq: Once | ORAL | Status: AC
  Administered 2013-08-08: 650 mg via ORAL

## 2013-08-08 MED ORDER — SODIUM CHLORIDE 0.9 % IV SOLN
10.0000 mg/kg | Freq: Once | INTRAVENOUS | Status: AC
  Administered 2013-08-08: 750 mg via INTRAVENOUS
  Filled 2013-08-08: qty 30

## 2013-08-08 MED ORDER — FAMOTIDINE IN NACL 20-0.9 MG/50ML-% IV SOLN
INTRAVENOUS | Status: AC
  Filled 2013-08-08: qty 50

## 2013-08-08 MED ORDER — ACETAMINOPHEN 325 MG PO TABS
ORAL_TABLET | ORAL | Status: AC
  Filled 2013-08-08: qty 2

## 2013-08-08 MED ORDER — FAMOTIDINE IN NACL 20-0.9 MG/50ML-% IV SOLN
20.0000 mg | Freq: Once | INTRAVENOUS | Status: AC
  Administered 2013-08-08: 20 mg via INTRAVENOUS

## 2013-08-08 MED ORDER — DIPHENHYDRAMINE HCL 25 MG PO CAPS
25.0000 mg | ORAL_CAPSULE | Freq: Once | ORAL | Status: AC
  Administered 2013-08-08: 25 mg via ORAL

## 2013-08-08 MED ORDER — DIPHENHYDRAMINE HCL 25 MG PO CAPS
ORAL_CAPSULE | ORAL | Status: AC
  Filled 2013-08-08: qty 1

## 2013-08-08 MED ORDER — HEPARIN SOD (PORK) LOCK FLUSH 100 UNIT/ML IV SOLN
500.0000 [IU] | Freq: Once | INTRAVENOUS | Status: AC | PRN
  Administered 2013-08-08: 500 [IU]
  Filled 2013-08-08: qty 5

## 2013-08-08 MED ORDER — DEXAMETHASONE SODIUM PHOSPHATE 10 MG/ML IJ SOLN
8.0000 mg | Freq: Once | INTRAMUSCULAR | Status: AC
  Administered 2013-08-08: 8 mg via INTRAVENOUS

## 2013-08-08 MED ORDER — SODIUM CHLORIDE 0.9 % IJ SOLN
10.0000 mL | INTRAMUSCULAR | Status: DC | PRN
  Filled 2013-08-08: qty 10

## 2013-08-08 MED ORDER — SODIUM CHLORIDE 0.9 % IV SOLN
Freq: Once | INTRAVENOUS | Status: AC
  Administered 2013-08-08: 13:00:00 via INTRAVENOUS

## 2013-08-08 NOTE — Progress Notes (Signed)
ID: Laura Hester   DOB: 1955-12-03  MR#: [id number]  GXQ#:[id number]  PCP: Gara Kroner, MD GYN: Princess Bruins, MD SU: OTHER MD: Precious Haws, MD at [facility] (fax # [phone]), Rae Mar  CHIEF COMPLAINT:  Multiple Myeloma   HISTORY OF PRESENT ILLNESS: Laura Hester has a history of iron deficiency anemia secondary to menorrhagia. However, as this persisted despite near-total cessation of her menses and despite iron supplementation, she was referred to Dr. Teena Irani for GI evaluation. He found the patient's Hb to have decreased from 9.17 June 2011 to 8.3 07/29/2011. MCV was 95.9, ferritin 93.3, with a normal folate and negative ANA. Guaiacs were negative. Creatinine was 0.77.  t-transglutaminase IgA was normal @ 2, but the total IgA was 6.9 g. Accordingly on 08/04/2011 an SPEP was obtained, showing an M-spike of 4.5 g, with a secondary M-spike of 0.4 g. Total protein was 10.0 with albumin 3.2, calcium 10.2. Bone survey 08/26/2011 showed multiple bone lesions consistent with myeloma and bone marrow biopsy 09/01/2011 confirmed the diagnosis with 75% myeloma cells in the marrow.   Laura Hester' subsequent history is detailed below.  INTERVAL HISTORY: Laura Hester returns today for followup of her multiple myeloma. Today is day 8 cycle 1 of her triple therapy according to BMS143, namely elotuzumab weekly, lenalidomide 25 mg daily for 21 days then off a week (started 08/01/2013) and dexamethasone 40 mg weekly (she usually takes that on Sundays)  REVIEW OF SYSTEMS: Laura Hester' cough has largely resolved. She had a lot more energy after her transfusion last week. She is still "taking it easy" and resting. She has been on Diflucan a week but the flush has not cleared. She had a reaction to the tape applied to her port a, and it caused a little bit of bleeding. She also had 2 episodes of mild epistaxis. She is careful not to blow her nose. She gets "wired" from the Decadron and feels a little sleepy from the  lenalidomide, which he takes at bedtime. As far as the elotuzumab, she has had absolutely no side effects it she is aware of. A detailed review of systems today was otherwise noncontributory  PAST MEDICAL HISTORY: Past Medical History  Diagnosis Date  . Fibrocystic breast disease   . Allergic rhinitis   . HTN (hypertension)     mild/ observation only  . Prediabetes   . SUI (stress urinary incontinence, female)   . Overweight (BMI 25.0-29.9)   . Multiple myeloma, stage 3 08/20/11 dx  . Anemia     resolved  . Cancer   . Arthritis     PAST SURGICAL HISTORY: Past Surgical History  Procedure Laterality Date  . Cholecystectomy  1995  . Tonsillectomy and adenoidectomy    . Carpal tunnel release      FAMILY HISTORY Family History  Problem Relation Age of Onset  . Heart disease Father   . Breast cancer Mother   . Heart disease Mother   . Diabetes Paternal Grandfather   . Hypertension Maternal Grandmother   . Hypertension Brother   The patient's parents are in their mid-92s.Her mother has a remote history of breast cancer. She has two brothers, one her twin; no sisters. There is no other history of cancer in the family to her knowledge  GYNECOLOGIC HISTORY: Menarche age 10, first live birth age 22, [place] P79. Last period was January 2013, before that May 2012.  SOCIAL HISTORY: (Updated  07/04/2013) She is a Forensic scientist and taught kindergarten until 2012. Her husband [name],  graduated from [employer] and works in Technical brewer.  Daughter Laura Hester works for [employer], married in 2013. Daughter [name] studies and works in [place] and comes home every other week and The patient attends the [place]: Not in place  HEALTH MAINTENANCE: (Updated 07/04/2013) History  Substance Use Topics  . Smoking status: Never Smoker   . Smokeless tobacco: Never Used  . Alcohol Use: No     Colonoscopy: 2009/[facility]  PAP: March 2014, Dr. Dellis Filbert  Bone  density: never  Lipid panel: [place]/ "excellent"  Mammography: June of 2014    Allergies  Allergen Reactions  . Wellbutrin [Bupropion Hcl] Hives    Current Outpatient Prescriptions  Medication Sig Dispense Refill  . dexamethasone (DECADRON) 4 MG tablet Take 10 tablets (40 mg total) by mouth once a week. Take day before IV chemo  40 tablet  6  . dexamethasone (DECADRON) 4 MG tablet Take 10 tab (40 mg) weekly except on days when you receive elotuzumab. On those days take 7 tab (28 mg) between 3 & 24 hours prior to the start of elotuzumab.  58 tablet  3  . fluconazole (DIFLUCAN) 100 MG tablet Take 1 tablet (100 mg total) by mouth daily.  20 tablet  3  . lenalidomide (REVLIMID) 25 MG capsule Take 1 capsule (25 mg total) by mouth daily.  21 capsule  6  . lidocaine-prilocaine (EMLA) cream Apply topically as needed.  30 g  3  . lidocaine-prilocaine (EMLA) cream Apply topically.      Marland Kitchen LORazepam (ATIVAN) 0.5 MG tablet Take 1 tablet (0.5 mg total) by mouth at bedtime as needed for anxiety.  30 tablet  0  . LORazepam (ATIVAN) 0.5 MG tablet Take 1 tablet (0.5 mg total) by mouth every 6 (six) hours as needed (Nausea or vomiting).  30 tablet  0  . naproxen sodium (ANAPROX) 220 MG tablet Take 1 tablet (220 mg total) by mouth 2 (two) times daily with a meal.  60 tablet  6  . omeprazole (PRILOSEC) 20 MG capsule Take 1 capsule (20 mg total) by mouth daily.  30 capsule  12  . ondansetron (ZOFRAN) 8 MG tablet Take 1 tablet (8 mg total) by mouth 2 (two) times daily as needed (Nausea or vomiting).  30 tablet  1  . Potassium Chloride ER 20 MEQ TBCR TAKE 1 TABLET (20 MEQ TOTAL) BY MOUTH DAILY.  90 tablet  0  . Potassium Chloride ER 20 MEQ TBCR TAKE 1 TABLET (20 MEQ TOTAL) BY MOUTH DAILY.      Marland Kitchen prochlorperazine (COMPAZINE) 10 MG tablet Take 1 tablet (10 mg total) by mouth every 6 (six) hours as needed (Nausea or vomiting).  30 tablet  1  . sulfamethoxazole-trimethoprim (BACTRIM DS) 800-160 MG per tablet Take 2  tablets by mouth 2 (two) times a week. Monday  AND THURSDAY  30 tablet  4  . traMADol (ULTRAM) 50 MG tablet Take 1 tablet (50 mg total) by mouth every 6 (six) hours as needed.  30 tablet  3  . valACYclovir (VALTREX) 500 MG tablet Take 1 tablet (500 mg total) by mouth daily.  30 tablet  12   No current facility-administered medications for this visit.   Facility-Administered Medications Ordered in Other Visits  Medication Dose Route Frequency Provider Last Rate Last Dose  . sodium chloride 0.9 % injection 10 mL  10 mL Intracatheter PRN Amy G Berry, PA-C   10 mL at 05/30/13 1640  .  sodium chloride 0.9 % injection 10 mL  10 mL Intravenous PRN Chauncey Cruel, MD   10 mL at 08/08/13 1045   OBJECTIVE: Middle-aged white woman in no acute distress Filed Vitals:   08/08/13 1105  BP: 136/68  Pulse: 99  Temp: 98 F (36.7 C)  Resp: 18     Body mass index is 29.11 kg/(m^2).     ECOG FS: 1  Filed Weights   08/08/13 1105  Weight: 159 lb 3.2 oz (72.213 kg)   Sclerae unicteric, pupils equal and reactive Oropharynx clear and moist-- teeth in good repair No cervical or supraclavicular adenopathy Lungs no rales or rhonchi Heart regular rate and rhythm, no murmur appreciated Abd soft, nontender, positive bowel sounds MSK no focal spinal tenderness to vigorous palpation and percussion Neuro: nonfocal, well oriented, positive affect Breasts: Deferred   LAB RESULTS:  Results for XOCHITL, EGLE (MRN [id number]) as of 08/01/2013 08:14  Ref. Range 03/07/2013 11:09 04/01/2013 08:46 04/29/2013 12:08 05/30/2013 12:16 06/27/2013 14:05  M-SPIKE, % No range found 1.17 0.30 0.33 0.47 0.55  Results for NIKKITA, ADEYEMI (MRN [id number]) as of 08/01/2013 08:14  Ref. Range 03/07/2013 11:09 04/01/2013 08:46 04/29/2013 12:08 05/30/2013 12:16 06/27/2013 14:05  Lambda Free Lght Chn Latest Range: 0.57-2.63 mg/dL 31.80 (H) 8.45 (H) 8.53 (H) 13.60 (H) 29.10 (H)    Lab Results  Component Value Date   WBC 7.8  08/01/2013   NEUTROABS 6.8* 08/01/2013   HGB 5.9* 08/01/2013   HCT 19.2* 08/01/2013   MCV 88.5 08/01/2013   PLT 106* 08/01/2013      Chemistry      Component Value Date/Time   NA 143 07/26/2013 0907   NA 143 12/01/2011 0836   K 3.8 07/26/2013 0907   K 4.0 12/01/2011 0836   CL 107 09/27/2012 0842   CL 105 12/01/2011 0836   CO2 22 07/26/2013 0907   CO2 29 12/01/2011 0836   BUN 12.5 07/26/2013 0907   BUN 12 12/01/2011 0836   CREATININE 1.3* 07/26/2013 0907   CREATININE 0.52 12/01/2011 0836      Component Value Date/Time   CALCIUM 10.4 07/26/2013 0907   CALCIUM 8.9 12/01/2011 0836   ALKPHOS 59 07/26/2013 0907   ALKPHOS 110 12/01/2011 0836   AST 66* 07/26/2013 0907   AST 15 12/01/2011 0836   ALT 10 07/26/2013 0907   ALT 16 12/01/2011 0836   BILITOT 0.71 07/26/2013 0907   BILITOT 0.3 12/01/2011 0836     06/27/2013 Magnesium:  1.9  Phosphorus:  4.4 LDH:   240 CRP:    3.5   Kappa/lamba Light Chains   06/27/2013 05/30/2013 Kappa free light chain   0.03  0.03 Lambda free light chain  29.10  13.60  Kappa:Lambda ratio   0.00  0.00  SPEP & IFE with QIG  06/27/2013 05/30/2013 IgG, Serum    133  202  IgA     1910  1530 IgM, Serum    <5  <5 Total Protein, Serum   6.3  6.8 Albumin ELP    38.7  48.1 Alpha-1-Globulin   6.1  5.7 Alpha-2-Globulin   11.9  11.1 Beta Globulin    9.2  8.5  Beta 2     25.0  19.6 Gmma Globulin   9.1  7.0 M-Spike, %    0.55  0.47  Protein Electro, 24-Hour Urine 06/27/2013 05/30/2013 Total volume, urine   2400  2400 Collective interval   24 hr  24 hr Total protein, urine  81  40 Total protein, urine/day  1944  960 Albumin    66.1  68.3 Alpha-1-globulin, urine  20.3  17.9 Alpha-2-globulin, urine  6.9  7.1 Beta globulin, urine   5.2  4.5 Gamma globulin, urine  1.5  2.2  Monoclonal band 1   None Det None Det Monoclonal band 2   None Det None Det     STUDIES: Dg Bone Survey Met  07/08/2013   CLINICAL DATA:  Multiple myeloma.  EXAM: METASTATIC BONE SURVEY   COMPARISON:  DG BONE SURVEY MET dated 02/03/2013; NM BONE WHOLE BODY dated 01/26/2013  FINDINGS: As noted on prior study multiple punctate lytic lesions are noted throughout the axial and appendicular skeleton. These are most prominent in the skull as noted on prior study. No new lesions identified. Power port catheter is in good anatomic position. There is stable cardiomegaly.  IMPRESSION: 1. Stable changes of multiple myeloma. No new lesions identified. Lytic lesions are most obvious in the skull and again are unchanged.  2.  Power port in good anatomic position.  3.  Stable cardiomegaly.   Electronically Signed   By: Marcello Moores  Register   On: 07/08/2013 09:53    ASSESSMENT: 58 y.o.  [place] woman presenting with anemia, found to have an IgA >6g, with an M-spike of 4.5 g (and a second M-spike of 0.4g), urine IFE showing IgA-lambda and lambda light chains, creatinine 0.77, calcium 10.0, albumin 3.1, and beta-2-microglobulin 5.51; with bone survey showing multiple myeloma lesions and bone marrow biopsy 09/01/2011 showing 75% myeloma cells. 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  (2) on prophylactic coumadin, discontinued as of 12/13/2011.  (3) zolendronic acid every 28 days, 09/19/2011 to 01/05/2012 (five doses)  (4) s/p neupogen mobilization October 2013, with a collection of 4.26 x10^6 CD34 positive cells  (5) status post melphalan at 200 mg/M2 on 02/23/2012, followed by autologous stem cell transplant the following day.  (6) on 06/07/2012 started maintenance bortezomib sq at 1.3 mg/M2 Q2w, with bactrim and acyclovir prophylaxis, the plan being to contuinue this for 2 years as per the HOVON trial  (7) zolendronic acid, given monthly, resumed June 2014  (8) disease progression noted with a rise in the IgA  lambda fraction September 2014  (9)  treated according to the [facility] S1304 randomized to the high dose arm, receiving carfilzomib  on days 1, 2, 8, 9, 15, and 16 of every 28 day cycle. Cycle 1 given at a reduced dose of 20 mg per meter square, with subsequent cycles given at 56 mg per meter square.  Regimen started 02/07/2013, discontinued as of 07/04/2013 (day 8 cycle 6) due to evidence of progression.  (10 enrolled in BMS 143, receiving elotuzumab weekly, with dexamethasone 40 mg weekly (taken on any non-elituzumab day) and lenalidomide at 25 mg/day for 21 days of each 28 day cycle, starting 08/01/2013  (a) prophylactic ASA 325 mg started with the first cycle  (b) prophylactic valacyclovir and trimethoprim-sulfamethoxazole continued as before  (c) received DTaP, Hep B and inactivated polio (Pediarix), H-flu and pneumococcal vacccines (HIb and PCV13) on   07/14/2013 at [facility]; to be repeated in June and October 2015   PLAN:  Lousie tolerated her first dose of elotuzumab remarkably well. She is tolerating the 25 mg of lenalidomide and the Decadron. She is proceeding  to the second dose of elotuzumab today. We're going to obtain an SPEP and light chains as well as a complete blood count today. We will repeat the complete blood count weekly, and the other labs every 3 weeks. She has moderate thrombocytopenia. We are continuing the aspirin. She will let us know if significant bleeding develops. Since she did so well last week I'm dropping the dose of Benadryl to 25 mg, which I find is better tolerated.  She will not see [place] next week but she will see me again in 2 weeks. She knows to call for any problems that may develop before her next visit here.  Chauncey Cruel, MD     08/08/2013

## 2013-08-08 NOTE — Patient Instructions (Signed)
[facility] Discharge Instructions for Patients Receiving Chemotherapy  Today you received the following chemotherapy agents elotuzumab  To help prevent nausea and vomiting after your treatment, we encourage you to take your nausea medication as directed   If you develop nausea and vomiting that is not controlled by your nausea medication, call the clinic.   BELOW ARE SYMPTOMS THAT SHOULD BE REPORTED IMMEDIATELY:  *FEVER GREATER THAN 100.5 F  *CHILLS WITH OR WITHOUT FEVER  NAUSEA AND VOMITING THAT IS NOT CONTROLLED WITH YOUR NAUSEA MEDICATION  *UNUSUAL SHORTNESS OF BREATH  *UNUSUAL BRUISING OR BLEEDING  TENDERNESS IN MOUTH AND THROAT WITH OR WITHOUT PRESENCE OF ULCERS  *URINARY PROBLEMS  *BOWEL PROBLEMS  UNUSUAL RASH Items with * indicate a potential emergency and should be followed up as soon as possible.  Feel free to call the clinic you have any questions or concerns. The clinic phone number is [phone].  

## 2013-08-08 NOTE — Patient Instructions (Signed)

## 2013-08-09 ENCOUNTER — Telehealth: Payer: Self-pay | Admitting: *Deleted

## 2013-08-09 NOTE — Telephone Encounter (Signed)
I have adjsuted 5/11 appt

## 2013-08-10 LAB — KAPPA/LAMBDA LIGHT CHAINS
KAPPA FREE LGHT CHN: 0.05 mg/dL — AB (ref 0.33–1.94)
Kappa:Lambda Ratio: 0.01 — ABNORMAL LOW (ref 0.26–1.65)
LAMBDA FREE LGHT CHN: 8.22 mg/dL — AB (ref 0.57–2.63)

## 2013-08-10 LAB — PROTEIN ELECTROPHORESIS, SERUM
ALPHA-1-GLOBULIN: 8.4 % — AB (ref 2.9–4.9)
ALPHA-2-GLOBULIN: 11 % (ref 7.1–11.8)
Albumin ELP: 25 % — ABNORMAL LOW (ref 55.8–66.1)
BETA GLOBULIN: 6.5 % (ref 4.7–7.2)
Beta 2: 33.4 % — ABNORMAL HIGH (ref 3.2–6.5)
Gamma Globulin: 15.7 % (ref 11.1–18.8)
M-SPIKE, %: 0.78 g/dL
TOTAL PROTEIN, SERUM ELECTROPHOR: 7.8 g/dL (ref 6.0–8.3)

## 2013-08-15 ENCOUNTER — Ambulatory Visit: Payer: BC Managed Care – PPO

## 2013-08-15 ENCOUNTER — Ambulatory Visit (HOSPITAL_BASED_OUTPATIENT_CLINIC_OR_DEPARTMENT_OTHER): Payer: BC Managed Care – PPO

## 2013-08-15 ENCOUNTER — Other Ambulatory Visit: Payer: Self-pay | Admitting: *Deleted

## 2013-08-15 ENCOUNTER — Other Ambulatory Visit (HOSPITAL_BASED_OUTPATIENT_CLINIC_OR_DEPARTMENT_OTHER): Payer: BC Managed Care – PPO

## 2013-08-15 VITALS — BP 114/60 | HR 91 | Temp 96.9°F

## 2013-08-15 DIAGNOSIS — Z5112 Encounter for antineoplastic immunotherapy: Secondary | ICD-10-CM

## 2013-08-15 DIAGNOSIS — C9000 Multiple myeloma not having achieved remission: Secondary | ICD-10-CM

## 2013-08-15 DIAGNOSIS — Z95828 Presence of other vascular implants and grafts: Secondary | ICD-10-CM

## 2013-08-15 LAB — CBC WITH DIFFERENTIAL/PLATELET
BASO%: 0.6 % (ref 0.0–2.0)
Basophils Absolute: 0 10*3/uL (ref 0.0–0.1)
EOS%: 0.2 % (ref 0.0–7.0)
Eosinophils Absolute: 0 10*3/uL (ref 0.0–0.5)
HCT: 29 % — ABNORMAL LOW (ref 34.8–46.6)
HGB: 9.5 g/dL — ABNORMAL LOW (ref 11.6–15.9)
LYMPH%: 16.9 % (ref 14.0–49.7)
MCH: 27.3 pg (ref 25.1–34.0)
MCHC: 32.6 g/dL (ref 31.5–36.0)
MCV: 83.9 fL (ref 79.5–101.0)
MONO#: 0.5 10*3/uL (ref 0.1–0.9)
MONO%: 15.7 % — AB (ref 0.0–14.0)
NEUT#: 2.3 10*3/uL (ref 1.5–6.5)
NEUT%: 66.6 % (ref 38.4–76.8)
PLATELETS: 111 10*3/uL — AB (ref 145–400)
RBC: 3.46 10*6/uL — ABNORMAL LOW (ref 3.70–5.45)
RDW: 19 % — ABNORMAL HIGH (ref 11.2–14.5)
WBC: 3.5 10*3/uL — ABNORMAL LOW (ref 3.9–10.3)
lymph#: 0.6 10*3/uL — ABNORMAL LOW (ref 0.9–3.3)

## 2013-08-15 LAB — COMPREHENSIVE METABOLIC PANEL (CC13)
ALBUMIN: 2.3 g/dL — AB (ref 3.5–5.0)
ALK PHOS: 101 U/L (ref 40–150)
ALT: 110 U/L — ABNORMAL HIGH (ref 0–55)
AST: 32 U/L (ref 5–34)
Anion Gap: 11 mEq/L (ref 3–11)
BUN: 18.2 mg/dL (ref 7.0–26.0)
CO2: 21 mEq/L — ABNORMAL LOW (ref 22–29)
CREATININE: 0.7 mg/dL (ref 0.6–1.1)
Calcium: 7 mg/dL — ABNORMAL LOW (ref 8.4–10.4)
Chloride: 104 mEq/L (ref 98–109)
GLUCOSE: 174 mg/dL — AB (ref 70–140)
Potassium: 3.4 mEq/L — ABNORMAL LOW (ref 3.5–5.1)
Sodium: 137 mEq/L (ref 136–145)
Total Bilirubin: 0.46 mg/dL (ref 0.20–1.20)
Total Protein: 5.5 g/dL — ABNORMAL LOW (ref 6.4–8.3)

## 2013-08-15 MED ORDER — SODIUM CHLORIDE 0.9 % IV SOLN
10.0000 mg/kg | Freq: Once | INTRAVENOUS | Status: AC
  Administered 2013-08-15: 750 mg via INTRAVENOUS
  Filled 2013-08-15: qty 30

## 2013-08-15 MED ORDER — ZOLEDRONIC ACID 4 MG/100ML IV SOLN
4.0000 mg | Freq: Once | INTRAVENOUS | Status: AC
  Administered 2013-08-15: 4 mg via INTRAVENOUS
  Filled 2013-08-15: qty 100

## 2013-08-15 MED ORDER — FAMOTIDINE IN NACL 20-0.9 MG/50ML-% IV SOLN
20.0000 mg | Freq: Once | INTRAVENOUS | Status: AC
  Administered 2013-08-15: 20 mg via INTRAVENOUS

## 2013-08-15 MED ORDER — DIPHENHYDRAMINE HCL 25 MG PO CAPS
ORAL_CAPSULE | ORAL | Status: AC
  Filled 2013-08-15: qty 2

## 2013-08-15 MED ORDER — DEXAMETHASONE SODIUM PHOSPHATE 10 MG/ML IJ SOLN
8.0000 mg | Freq: Once | INTRAMUSCULAR | Status: AC
  Administered 2013-08-15: 09:00:00 via INTRAVENOUS

## 2013-08-15 MED ORDER — ACETAMINOPHEN 325 MG PO TABS
ORAL_TABLET | ORAL | Status: AC
  Filled 2013-08-15: qty 2

## 2013-08-15 MED ORDER — DEXAMETHASONE SODIUM PHOSPHATE 10 MG/ML IJ SOLN
INTRAMUSCULAR | Status: AC
  Filled 2013-08-15: qty 1

## 2013-08-15 MED ORDER — DIPHENHYDRAMINE HCL 25 MG PO CAPS
25.0000 mg | ORAL_CAPSULE | Freq: Once | ORAL | Status: AC
  Administered 2013-08-15: 25 mg via ORAL

## 2013-08-15 MED ORDER — ACETAMINOPHEN 325 MG PO TABS
650.0000 mg | ORAL_TABLET | Freq: Once | ORAL | Status: AC
  Administered 2013-08-15: 650 mg via ORAL

## 2013-08-15 MED ORDER — SODIUM CHLORIDE 0.9 % IJ SOLN
10.0000 mL | INTRAMUSCULAR | Status: DC | PRN
  Administered 2013-08-15: 10 mL
  Filled 2013-08-15: qty 10

## 2013-08-15 MED ORDER — FAMOTIDINE IN NACL 20-0.9 MG/50ML-% IV SOLN
INTRAVENOUS | Status: AC
  Filled 2013-08-15: qty 50

## 2013-08-15 MED ORDER — HEPARIN SOD (PORK) LOCK FLUSH 100 UNIT/ML IV SOLN
500.0000 [IU] | Freq: Once | INTRAVENOUS | Status: AC | PRN
  Administered 2013-08-15: 500 [IU]
  Filled 2013-08-15: qty 5

## 2013-08-15 MED ORDER — SODIUM CHLORIDE 0.9 % IV SOLN
Freq: Once | INTRAVENOUS | Status: AC
  Administered 2013-08-15: 09:00:00 via INTRAVENOUS

## 2013-08-15 MED ORDER — SODIUM CHLORIDE 0.9 % IJ SOLN
10.0000 mL | INTRAMUSCULAR | Status: DC | PRN
  Administered 2013-08-15: 10 mL via INTRAVENOUS
  Filled 2013-08-15: qty 10

## 2013-08-15 NOTE — Patient Instructions (Signed)
[facility] Discharge Instructions for Patients Receiving Chemotherapy  Today you received the following chemotherapy agent:  Elotuzumab.   To help prevent nausea and vomiting after your treatment, we encourage you to take your nausea medication as directed.     If you develop nausea and vomiting that is not controlled by your nausea medication, call the clinic.   BELOW ARE SYMPTOMS THAT SHOULD BE REPORTED IMMEDIATELY:  *FEVER GREATER THAN 100.5 F  *CHILLS WITH OR WITHOUT FEVER  NAUSEA AND VOMITING THAT IS NOT CONTROLLED WITH YOUR NAUSEA MEDICATION  *UNUSUAL SHORTNESS OF BREATH  *UNUSUAL BRUISING OR BLEEDING  TENDERNESS IN MOUTH AND THROAT WITH OR WITHOUT PRESENCE OF ULCERS  *URINARY PROBLEMS  *BOWEL PROBLEMS  UNUSUAL RASH Items with * indicate a potential emergency and should be followed up as soon as possible.  Feel free to call the clinic you have any questions or concerns. The clinic phone number is [phone].

## 2013-08-15 NOTE — Patient Instructions (Signed)

## 2013-08-17 LAB — PROTEIN ELECTROPHORESIS, SERUM
Albumin ELP: 44.8 % — ABNORMAL LOW (ref 55.8–66.1)
Alpha-1-Globulin: 8.4 % — ABNORMAL HIGH (ref 2.9–4.9)
Alpha-2-Globulin: 13.2 % — ABNORMAL HIGH (ref 7.1–11.8)
Beta 2: 17.4 % — ABNORMAL HIGH (ref 3.2–6.5)
Beta Globulin: 7.5 % — ABNORMAL HIGH (ref 4.7–7.2)
GAMMA GLOBULIN: 8.7 % — AB (ref 11.1–18.8)
M-SPIKE, %: 0.41 g/dL
TOTAL PROTEIN, SERUM ELECTROPHOR: 5.4 g/dL — AB (ref 6.0–8.3)

## 2013-08-17 LAB — KAPPA/LAMBDA LIGHT CHAINS
KAPPA FREE LGHT CHN: 0.03 mg/dL — AB (ref 0.33–1.94)
Kappa:Lambda Ratio: 0.01 — ABNORMAL LOW (ref 0.26–1.65)
LAMBDA FREE LGHT CHN: 4.3 mg/dL — AB (ref 0.57–2.63)

## 2013-08-22 ENCOUNTER — Other Ambulatory Visit: Payer: Self-pay | Admitting: *Deleted

## 2013-08-22 ENCOUNTER — Ambulatory Visit (HOSPITAL_BASED_OUTPATIENT_CLINIC_OR_DEPARTMENT_OTHER): Payer: BC Managed Care – PPO

## 2013-08-22 ENCOUNTER — Telehealth: Payer: Self-pay | Admitting: Oncology

## 2013-08-22 ENCOUNTER — Other Ambulatory Visit: Payer: Self-pay | Admitting: Oncology

## 2013-08-22 ENCOUNTER — Encounter: Payer: Self-pay | Admitting: *Deleted

## 2013-08-22 ENCOUNTER — Ambulatory Visit (HOSPITAL_BASED_OUTPATIENT_CLINIC_OR_DEPARTMENT_OTHER): Payer: BC Managed Care – PPO | Admitting: Oncology

## 2013-08-22 VITALS — BP 120/70 | HR 98 | Temp 98.4°F | Resp 18 | Ht 62.0 in | Wt 158.4 lb

## 2013-08-22 DIAGNOSIS — Z5112 Encounter for antineoplastic immunotherapy: Secondary | ICD-10-CM

## 2013-08-22 DIAGNOSIS — J069 Acute upper respiratory infection, unspecified: Secondary | ICD-10-CM

## 2013-08-22 DIAGNOSIS — C9000 Multiple myeloma not having achieved remission: Secondary | ICD-10-CM

## 2013-08-22 DIAGNOSIS — J45909 Unspecified asthma, uncomplicated: Secondary | ICD-10-CM

## 2013-08-22 DIAGNOSIS — M545 Low back pain, unspecified: Secondary | ICD-10-CM

## 2013-08-22 DIAGNOSIS — D649 Anemia, unspecified: Secondary | ICD-10-CM

## 2013-08-22 DIAGNOSIS — D630 Anemia in neoplastic disease: Secondary | ICD-10-CM

## 2013-08-22 DIAGNOSIS — I10 Essential (primary) hypertension: Secondary | ICD-10-CM

## 2013-08-22 DIAGNOSIS — Z95828 Presence of other vascular implants and grafts: Secondary | ICD-10-CM

## 2013-08-22 LAB — CBC WITH DIFFERENTIAL/PLATELET
BASO%: 3.2 % — ABNORMAL HIGH (ref 0.0–2.0)
BASOS ABS: 0.2 10*3/uL — AB (ref 0.0–0.1)
EOS%: 8.9 % — AB (ref 0.0–7.0)
Eosinophils Absolute: 0.5 10*3/uL (ref 0.0–0.5)
HEMATOCRIT: 27.3 % — AB (ref 34.8–46.6)
HEMOGLOBIN: 8.8 g/dL — AB (ref 11.6–15.9)
LYMPH%: 6.1 % — ABNORMAL LOW (ref 14.0–49.7)
MCH: 27.5 pg (ref 25.1–34.0)
MCHC: 32.1 g/dL (ref 31.5–36.0)
MCV: 85.8 fL (ref 79.5–101.0)
MONO#: 0.9 10*3/uL (ref 0.1–0.9)
MONO%: 14 % (ref 0.0–14.0)
NEUT#: 4.2 10*3/uL (ref 1.5–6.5)
NEUT%: 67.8 % (ref 38.4–76.8)
PLATELETS: 273 10*3/uL (ref 145–400)
RBC: 3.19 10*6/uL — ABNORMAL LOW (ref 3.70–5.45)
RDW: 19.4 % — ABNORMAL HIGH (ref 11.2–14.5)
WBC: 6.1 10*3/uL (ref 3.9–10.3)
lymph#: 0.4 10*3/uL — ABNORMAL LOW (ref 0.9–3.3)

## 2013-08-22 LAB — COMPREHENSIVE METABOLIC PANEL (CC13)
ALK PHOS: 238 U/L — AB (ref 40–150)
ALT: 52 U/L (ref 0–55)
AST: 25 U/L (ref 5–34)
Albumin: 2.4 g/dL — ABNORMAL LOW (ref 3.5–5.0)
Anion Gap: 12 mEq/L — ABNORMAL HIGH (ref 3–11)
BILIRUBIN TOTAL: 0.43 mg/dL (ref 0.20–1.20)
BUN: 7 mg/dL (ref 7.0–26.0)
CO2: 24 meq/L (ref 22–29)
Calcium: 7.2 mg/dL — ABNORMAL LOW (ref 8.4–10.4)
Chloride: 107 mEq/L (ref 98–109)
Creatinine: 0.6 mg/dL (ref 0.6–1.1)
Glucose: 109 mg/dl (ref 70–140)
Potassium: 3.2 mEq/L — ABNORMAL LOW (ref 3.5–5.1)
SODIUM: 143 meq/L (ref 136–145)
TOTAL PROTEIN: 5.4 g/dL — AB (ref 6.4–8.3)

## 2013-08-22 LAB — TECHNOLOGIST REVIEW

## 2013-08-22 MED ORDER — DIPHENHYDRAMINE HCL 25 MG PO CAPS
ORAL_CAPSULE | ORAL | Status: AC
  Filled 2013-08-22: qty 1

## 2013-08-22 MED ORDER — DIPHENHYDRAMINE HCL 25 MG PO CAPS
25.0000 mg | ORAL_CAPSULE | Freq: Once | ORAL | Status: AC
  Administered 2013-08-22: 25 mg via ORAL

## 2013-08-22 MED ORDER — ACETAMINOPHEN 325 MG PO TABS
650.0000 mg | ORAL_TABLET | Freq: Once | ORAL | Status: AC
  Administered 2013-08-22: 650 mg via ORAL

## 2013-08-22 MED ORDER — ACETAMINOPHEN 325 MG PO TABS
ORAL_TABLET | ORAL | Status: AC
  Filled 2013-08-22: qty 2

## 2013-08-22 MED ORDER — SODIUM CHLORIDE 0.9 % IV SOLN
10.0000 mg/kg | Freq: Once | INTRAVENOUS | Status: AC
  Administered 2013-08-22: 750 mg via INTRAVENOUS
  Filled 2013-08-22: qty 30

## 2013-08-22 MED ORDER — FAMOTIDINE IN NACL 20-0.9 MG/50ML-% IV SOLN
20.0000 mg | Freq: Once | INTRAVENOUS | Status: AC
  Administered 2013-08-22: 20 mg via INTRAVENOUS

## 2013-08-22 MED ORDER — LENALIDOMIDE 25 MG PO CAPS: 25.0000 mg | ORAL_CAPSULE | Freq: Every day | ORAL | Status: DC

## 2013-08-22 MED ORDER — FAMOTIDINE IN NACL 20-0.9 MG/50ML-% IV SOLN
INTRAVENOUS | Status: AC
  Filled 2013-08-22: qty 50

## 2013-08-22 MED ORDER — SODIUM CHLORIDE 0.9 % IJ SOLN
10.0000 mL | INTRAMUSCULAR | Status: DC | PRN
  Filled 2013-08-22: qty 10

## 2013-08-22 MED ORDER — SODIUM CHLORIDE 0.9 % IV SOLN
Freq: Once | INTRAVENOUS | Status: AC
  Administered 2013-08-22: 11:00:00 via INTRAVENOUS

## 2013-08-22 MED ORDER — SODIUM CHLORIDE 0.9 % IJ SOLN
10.0000 mL | INTRAMUSCULAR | Status: DC | PRN
  Administered 2013-08-22 (×2): 10 mL via INTRAVENOUS
  Filled 2013-08-22: qty 10

## 2013-08-22 MED ORDER — HEPARIN SOD (PORK) LOCK FLUSH 100 UNIT/ML IV SOLN
500.0000 [IU] | Freq: Once | INTRAVENOUS | Status: AC | PRN
  Administered 2013-08-22: 500 [IU]
  Filled 2013-08-22: qty 5

## 2013-08-22 MED ORDER — DEXAMETHASONE SODIUM PHOSPHATE 10 MG/ML IJ SOLN
8.0000 mg | Freq: Once | INTRAMUSCULAR | Status: AC
  Administered 2013-08-22: 8 mg via INTRAVENOUS

## 2013-08-22 MED ORDER — DEXAMETHASONE SODIUM PHOSPHATE 20 MG/5ML IJ SOLN
INTRAMUSCULAR | Status: AC
  Filled 2013-08-22: qty 5

## 2013-08-22 NOTE — Patient Instructions (Signed)

## 2013-08-22 NOTE — Progress Notes (Signed)
08/22/13 BMS 143, Cycle 1, day 22 Mrs. Haser is in the cancer center today for lab work, physical exam, and treatment. She reports completing her revlimid last evening. She took Decadron, 28 mg (7 pills) this morning at 7:00AM. She also reports she has slept well all week and not needed any medication for sleep.  Physical exam performed by Dr. Jana Hakim.  Sign for infusion given to D. Burleson, Therapist, sports.

## 2013-08-22 NOTE — Telephone Encounter (Signed)
, °

## 2013-08-22 NOTE — Progress Notes (Signed)
ID: Laura Hester   DOB: [birth year]  MR#: [id number]  CSN#:[id number]  PCP: Gara Kroner, MD GYN: Princess Bruins, MD SU: OTHER MD: Precious Haws, MD at [facility] (fax # [phone]), Rae Mar  CHIEF COMPLAINT:  Multiple Myeloma   HISTORY OF PRESENT ILLNESS: Laura Hester has a history of iron deficiency anemia secondary to menorrhagia. However, as this persisted despite near-total cessation of her menses and despite iron supplementation, she was referred to Dr. Teena Irani for GI evaluation. He found the patient's Hb to have decreased from 9.17 June 2011 to 8.3 07/29/2011. MCV was 95.9, ferritin 93.3, with a normal folate and negative ANA. Guaiacs were negative. Creatinine was 0.77.  t-transglutaminase IgA was normal @ 2, but the total IgA was 6.9 g. Accordingly on 08/04/2011 an SPEP was obtained, showing an M-spike of 4.5 g, with a secondary M-spike of 0.4 g. Total protein was 10.0 with albumin 3.2, calcium 10.2. Bone survey 08/26/2011 showed multiple bone lesions consistent with myeloma and bone marrow biopsy 09/01/2011 confirmed the diagnosis with 75% myeloma cells in the marrow.   Laura Hester' subsequent history is detailed below.  INTERVAL HISTORY: Laura Hester returns today for followup of her multiple myeloma accompanied by her husband Laura Hester. Today is day 22 cycle 1 of her triple therapy according to BMS143, namely elotuzumab weekly, lenalidomide 25 mg daily for 21 days then off a week (started 08/01/2013) and dexamethasone 40 mg weekly. Today is her first day off the Revlimid. Overall she is tolerating the treatment "really well".  REVIEW OF SYSTEMS: Laura Hester' main problem is fatigue. Likely this is due to her anemia, but of course her medications may also contribute. She tells me she has been feeling "the best she has" for the last few days, and she was hoping her hemoglobin would be better today instead of a little bit lower. She is having no nausea, no constipation, she is drinking a lot of  water and her urine is always clear, she's had no peripheral neuropathy no mouth sores no fever no rash and no bleeding including no epistaxis. She is sleeping a little better. A detailed review of systems today was otherwise noncontributory  PAST MEDICAL HISTORY: Past Medical History  Diagnosis Date  . Fibrocystic breast disease   . Allergic rhinitis   . HTN (hypertension)     mild/ observation only  . Prediabetes   . SUI (stress urinary incontinence, female)   . Overweight (BMI 25.0-29.9)   . Multiple myeloma, stage 3 08/20/11 dx  . Anemia     resolved  . Cancer   . Arthritis     PAST SURGICAL HISTORY: Past Surgical History  Procedure Laterality Date  . Cholecystectomy  1995  . Tonsillectomy and adenoidectomy    . Carpal tunnel release      FAMILY HISTORY Family History  Problem Relation Age of Onset  . Heart disease Father   . Breast cancer Mother   . Heart disease Mother   . Diabetes Paternal Grandfather   . Hypertension Maternal Grandmother   . Hypertension Brother   The patient's parents are in their mid-58s.Her mother has a remote history of breast cancer. She has two brothers, one her twin; no sisters. There is no other history of cancer in the family to her knowledge  GYNECOLOGIC HISTORY: Menarche age 54, first live birth age 72, [place] P50. Last period was January 2013, before that May 2012.  SOCIAL HISTORY: (Updated  07/04/2013) She is a Forensic scientist and taught kindergarten until 2012. Her  husband Laura Hester, graduated from [employer] and works in Technical brewer.  Daughter Laura Hester works for [employer], married in 2013. Daughter Laura Hester studies and works in [place] and comes home every other week and The patient attends the [employer]: Not in place  HEALTH MAINTENANCE: (Updated 07/04/2013) History  Substance Use Topics  . Smoking status: Never Smoker   . Smokeless tobacco: Never Used  . Alcohol Use: No      Colonoscopy: 2009/[facility]  PAP: March 2014, Dr. Dellis Filbert  Bone density: never  Lipid panel: [place]/ "excellent"  Mammography: June of 2014    Allergies  Allergen Reactions  . Wellbutrin [Bupropion Hcl] Hives    Current Outpatient Prescriptions  Medication Sig Dispense Refill  . dexamethasone (DECADRON) 4 MG tablet Take 10 tablets (40 mg total) by mouth once a week. Take day before IV chemo  40 tablet  6  . fluconazole (DIFLUCAN) 100 MG tablet Take 1 tablet (100 mg total) by mouth daily.  20 tablet  3  . lenalidomide (REVLIMID) 25 MG capsule Take 1 capsule (25 mg total) by mouth daily.  21 capsule  6  . lidocaine-prilocaine (EMLA) cream Apply topically as needed.  30 g  3  . LORazepam (ATIVAN) 0.5 MG tablet Take 1 tablet (0.5 mg total) by mouth every 6 (six) hours as needed (Nausea or vomiting).  30 tablet  0  . naproxen sodium (ANAPROX) 220 MG tablet Take 1 tablet (220 mg total) by mouth 2 (two) times daily with a meal.  60 tablet  6  . omeprazole (PRILOSEC) 20 MG capsule Take 1 capsule (20 mg total) by mouth daily.  30 capsule  12  . ondansetron (ZOFRAN) 8 MG tablet Take 1 tablet (8 mg total) by mouth 2 (two) times daily as needed (Nausea or vomiting).  30 tablet  1  . Potassium Chloride ER 20 MEQ TBCR TAKE 1 TABLET (20 MEQ TOTAL) BY MOUTH DAILY.  90 tablet  0  . prochlorperazine (COMPAZINE) 10 MG tablet Take 1 tablet (10 mg total) by mouth every 6 (six) hours as needed (Nausea or vomiting).  30 tablet  1  . sulfamethoxazole-trimethoprim (BACTRIM DS) 800-160 MG per tablet Take 2 tablets by mouth 2 (two) times a week. Monday  AND THURSDAY  30 tablet  4  . traMADol (ULTRAM) 50 MG tablet Take 1 tablet (50 mg total) by mouth every 6 (six) hours as needed.  30 tablet  3  . valACYclovir (VALTREX) 500 MG tablet Take 1 tablet (500 mg total) by mouth daily.  30 tablet  12   No current facility-administered medications for this visit.   Facility-Administered Medications Ordered in Other  Visits  Medication Dose Route Frequency Provider Last Rate Last Dose  . sodium chloride 0.9 % injection 10 mL  10 mL Intracatheter PRN Amy G Berry, PA-C   10 mL at 05/30/13 1640  . sodium chloride 0.9 % injection 10 mL  10 mL Intravenous PRN Chauncey Cruel, MD   10 mL at 08/08/13 1518  . sodium chloride 0.9 % injection 10 mL  10 mL Intravenous PRN Chauncey Cruel, MD   10 mL at 08/22/13 6333   OBJECTIVE: Middle-aged white woman who appears her stated age 58 Vitals:   08/22/13 0944  BP: 120/70  Pulse: 98  Temp: 98.4 F (36.9 C)  Resp: 18     Body mass index is 28.96 kg/(m^2).     ECOG FS: 1  Filed Weights  08/22/13 0944  Weight: 158 lb 6.4 oz (71.85 kg)   Sclerae unicteric, EOMs intact Oropharynx clear and moist-- no evidence of thrush No cervical or supraclavicular adenopathy Lungs no rales or rhonchi Heart regular rate and rhythm, no murmur appreciated Abd soft, nontender, positive bowel sounds, no masses palpated MSK no focal spinal tenderness to palpation and percussion Neuro: nonfocal, well oriented, positive affect Breasts: Deferred   LAB RESULTS: M-Spike, % g/dL  0.41    0.78 CM   0.55 CM   0.47 CM   0.33 CM   0.30 CM   1.17  Lambda Free Lght Chn 0.57 - 2.63 mg/dL  4.30 (H)    8.22 (H)    29.10 (H)    13.60 (H)    8.53 (H)    8.45 (H)      Lab Results  Component Value Date   WBC 6.1 08/22/2013   NEUTROABS 4.2 08/22/2013   HGB 8.8* 08/22/2013   HCT 27.3* 08/22/2013   MCV 85.8 08/22/2013   PLT 273 08/22/2013      Chemistry      Component Value Date/Time   NA 137 08/15/2013 0818   NA 143 12/01/2011 0836   K 3.4* 08/15/2013 0818   K 4.0 12/01/2011 0836   CL 107 09/27/2012 0842   CL 105 12/01/2011 0836   CO2 21* 08/15/2013 0818   CO2 29 12/01/2011 0836   BUN 18.2 08/15/2013 0818   BUN 12 12/01/2011 0836   CREATININE 0.7 08/15/2013 0818   CREATININE 0.52 12/01/2011 0836      Component Value Date/Time   CALCIUM 7.0* 08/15/2013 0818   CALCIUM 8.9  12/01/2011 0836   ALKPHOS 101 08/15/2013 0818   ALKPHOS 110 12/01/2011 0836   AST 32 08/15/2013 0818   AST 15 12/01/2011 0836   ALT 110* 08/15/2013 0818   ALT 16 12/01/2011 0836   BILITOT 0.46 08/15/2013 0818   BILITOT 0.3 12/01/2011 0836     06/27/2013 Magnesium:  1.9  Phosphorus:  4.4 LDH:   240 CRP:    3.5   STUDIES: No results found.  ASSESSMENT: 58 y.o.  [place] woman presenting with anemia, found to have an IgA >6g, with an M-spike of 4.5 g (and a second M-spike of 0.4g), urine IFE showing IgA-lambda and lambda light chains, creatinine 0.77, calcium 10.0, albumin 3.1, and beta-2-microglobulin 5.51; with bone survey showing multiple myeloma lesions and bone marrow biopsy 09/01/2011 showing 75% myeloma cells. 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  (2) on prophylactic coumadin, discontinued as of 12/13/2011.  (3) zolendronic acid every 28 days, 09/19/2011 to 01/05/2012 (five doses)  (4) s/p neupogen mobilization October 2013, with a collection of 4.26 x10^6 CD34 positive cells  (5) status post melphalan at 200 mg/M2 on 02/23/2012, followed by autologous stem cell transplant the following day.  (6) on 06/07/2012 started maintenance bortezomib sq at 1.3 mg/M2 Q2w, with bactrim and acyclovir prophylaxis, the plan being to contuinue this for 2 years as per the HOVON trial  (7) zolendronic acid, given monthly, resumed June 2014  (8) disease progression noted with a rise in the IgA lambda fraction September 2014  (9)  treated according to the [facility] S1304 randomized to the high dose arm, receiving carfilzomib  on days 1, 2, 8, 9, 15, and 16 of  every 28 day cycle. Cycle 1 given at a reduced dose of 20 mg per meter square, with subsequent cycles given at 56 mg per meter square.  Regimen started 02/07/2013,  discontinued as of 07/04/2013 (day 8 cycle 6) due to evidence of progression.  (10 enrolled in BMS 143, receiving elotuzumab weekly, with dexamethasone 40 mg weekly (taken on any non-elituzumab day) and lenalidomide at 25 mg/day for 21 days of each 28 day cycle, starting 08/01/2013  (a) prophylactic ASA 325 mg started with the first cycle  (b) prophylactic valacyclovir and trimethoprim-sulfamethoxazole continued as before  (c) received DTaP, Hep B and inactivated polio (Pediarix), H-flu and pneumococcal vacccines (HIb and PCV13) on   07/14/2013 at [facility]; to be repeated in June and October 2015   PLAN:  Callen will proceed with her day 22 cycle 1 treatment today. The lambda light chains are coming down very nicely. The M spike is down as well, although it is still a little bit "jumpy". I think she is having an early response.  She is disappointed that her hemoglobin was again down. We discussed people. She somehow understood that he go can heart the heart and so on. It remains a good option for her and I would be very comfortable with her receiving it. The alternative if she becomes more symptomatic would be transfusion.  She is going off the Revlimid as of today, so she will be a week off that medication before she returns next week. Insofar as the Revlimid may be affecting the hemoglobin, possibly her hemoglobin will have improved by that. Otherwise we can again discuss the options of transfusion or starting aranesp.  Her treatment falls on memorial day, and needs to be moved to May 26. That day he is full but I have asked nursing to make room for her since she is a protocol patient. We'll make sure her Revlimid is delivered to her home this week since she can start again next Monday.  She has a good understanding of the overall plan. She agrees with it. She knows the goal of treatment in her case is control. She will call with any problems that may develop before her next visit here. Chauncey Cruel, MD     08/22/2013

## 2013-08-24 LAB — PROTEIN ELECTROPHORESIS, SERUM
ALPHA-1-GLOBULIN: 8.1 % — AB (ref 2.9–4.9)
Albumin ELP: 47 % — ABNORMAL LOW (ref 55.8–66.1)
Alpha-2-Globulin: 16.5 % — ABNORMAL HIGH (ref 7.1–11.8)
Beta 2: 13.1 % — ABNORMAL HIGH (ref 3.2–6.5)
Beta Globulin: 8.1 % — ABNORMAL HIGH (ref 4.7–7.2)
GAMMA GLOBULIN: 7.2 % — AB (ref 11.1–18.8)
M-Spike, %: 0.24 g/dL
Total Protein, Serum Electrophoresis: 5.1 g/dL — ABNORMAL LOW (ref 6.0–8.3)

## 2013-08-24 LAB — KAPPA/LAMBDA LIGHT CHAINS
Kappa free light chain: 0.03 mg/dL — ABNORMAL LOW (ref 0.33–1.94)
Kappa:Lambda Ratio: 0 — ABNORMAL LOW (ref 0.26–1.65)
Lambda Free Lght Chn: 6.5 mg/dL — ABNORMAL HIGH (ref 0.57–2.63)

## 2013-08-26 ENCOUNTER — Telehealth: Payer: Self-pay | Admitting: *Deleted

## 2013-08-26 ENCOUNTER — Telehealth: Payer: Self-pay | Admitting: Oncology

## 2013-08-26 NOTE — Telephone Encounter (Signed)
Per staff message and POF I have scheduled appts.  JMW  

## 2013-08-26 NOTE — Telephone Encounter (Signed)
Sent Laura Hester a staff msg to add the pt's chemo appts on for 09/12/2013 and for 09/19/2013

## 2013-08-26 NOTE — Telephone Encounter (Signed)
lmonvm advising the pt of her appts on 09/12/2013 and to pick up her schedule at the next visit.

## 2013-08-29 ENCOUNTER — Ambulatory Visit (HOSPITAL_BASED_OUTPATIENT_CLINIC_OR_DEPARTMENT_OTHER): Payer: BC Managed Care – PPO

## 2013-08-29 ENCOUNTER — Other Ambulatory Visit: Payer: Self-pay | Admitting: Oncology

## 2013-08-29 ENCOUNTER — Encounter: Payer: Self-pay | Admitting: *Deleted

## 2013-08-29 ENCOUNTER — Other Ambulatory Visit (HOSPITAL_BASED_OUTPATIENT_CLINIC_OR_DEPARTMENT_OTHER): Payer: BC Managed Care – PPO

## 2013-08-29 VITALS — BP 132/52 | HR 94 | Temp 98.6°F

## 2013-08-29 DIAGNOSIS — Z5112 Encounter for antineoplastic immunotherapy: Secondary | ICD-10-CM

## 2013-08-29 DIAGNOSIS — C9000 Multiple myeloma not having achieved remission: Secondary | ICD-10-CM

## 2013-08-29 DIAGNOSIS — D630 Anemia in neoplastic disease: Secondary | ICD-10-CM

## 2013-08-29 LAB — CBC WITH DIFFERENTIAL/PLATELET
BASO%: 4.2 % — AB (ref 0.0–2.0)
Basophils Absolute: 0.3 10*3/uL — ABNORMAL HIGH (ref 0.0–0.1)
EOS%: 0.4 % (ref 0.0–7.0)
Eosinophils Absolute: 0 10*3/uL (ref 0.0–0.5)
HCT: 30.1 % — ABNORMAL LOW (ref 34.8–46.6)
HGB: 9.3 g/dL — ABNORMAL LOW (ref 11.6–15.9)
LYMPH#: 0.3 10*3/uL — AB (ref 0.9–3.3)
LYMPH%: 4.9 % — ABNORMAL LOW (ref 14.0–49.7)
MCH: 27.9 pg (ref 25.1–34.0)
MCHC: 30.9 g/dL — AB (ref 31.5–36.0)
MCV: 90.4 fL (ref 79.5–101.0)
MONO#: 0.2 10*3/uL (ref 0.1–0.9)
MONO%: 2.9 % (ref 0.0–14.0)
NEUT#: 6 10*3/uL (ref 1.5–6.5)
NEUT%: 87.6 % — ABNORMAL HIGH (ref 38.4–76.8)
Platelets: 301 10*3/uL (ref 145–400)
RBC: 3.33 10*6/uL — ABNORMAL LOW (ref 3.70–5.45)
RDW: 21.1 % — AB (ref 11.2–14.5)
WBC: 6.9 10*3/uL (ref 3.9–10.3)
nRBC: 0 % (ref 0–0)

## 2013-08-29 MED ORDER — DIPHENHYDRAMINE HCL 25 MG PO CAPS
ORAL_CAPSULE | ORAL | Status: AC
  Filled 2013-08-29: qty 1

## 2013-08-29 MED ORDER — SODIUM CHLORIDE 0.9 % IJ SOLN
10.0000 mL | INTRAMUSCULAR | Status: DC | PRN
  Administered 2013-08-29: 10 mL
  Filled 2013-08-29: qty 10

## 2013-08-29 MED ORDER — DIPHENHYDRAMINE HCL 25 MG PO CAPS
25.0000 mg | ORAL_CAPSULE | Freq: Once | ORAL | Status: AC
  Administered 2013-08-29: 25 mg via ORAL

## 2013-08-29 MED ORDER — ACETAMINOPHEN 325 MG PO TABS
ORAL_TABLET | ORAL | Status: AC
  Filled 2013-08-29: qty 2

## 2013-08-29 MED ORDER — ACETAMINOPHEN 325 MG PO TABS
650.0000 mg | ORAL_TABLET | Freq: Once | ORAL | Status: AC
  Administered 2013-08-29: 650 mg via ORAL

## 2013-08-29 MED ORDER — SODIUM CHLORIDE 0.9 % IV SOLN
10.0000 mg/kg | Freq: Once | INTRAVENOUS | Status: AC
  Administered 2013-08-29: 735 mg via INTRAVENOUS
  Filled 2013-08-29: qty 29.4

## 2013-08-29 MED ORDER — FAMOTIDINE IN NACL 20-0.9 MG/50ML-% IV SOLN
20.0000 mg | Freq: Once | INTRAVENOUS | Status: AC
  Administered 2013-08-29: 20 mg via INTRAVENOUS

## 2013-08-29 MED ORDER — HEPARIN SOD (PORK) LOCK FLUSH 100 UNIT/ML IV SOLN
500.0000 [IU] | Freq: Once | INTRAVENOUS | Status: AC | PRN
  Administered 2013-08-29: 500 [IU]
  Filled 2013-08-29: qty 5

## 2013-08-29 MED ORDER — SODIUM CHLORIDE 0.9 % IV SOLN
Freq: Once | INTRAVENOUS | Status: AC
  Administered 2013-08-29: 10:00:00 via INTRAVENOUS

## 2013-08-29 MED ORDER — DEXAMETHASONE SODIUM PHOSPHATE 10 MG/ML IJ SOLN
8.0000 mg | Freq: Once | INTRAMUSCULAR | Status: AC
  Administered 2013-08-29: 8 mg via INTRAVENOUS

## 2013-08-29 MED ORDER — DEXAMETHASONE SODIUM PHOSPHATE 10 MG/ML IJ SOLN
INTRAMUSCULAR | Status: AC
  Filled 2013-08-29: qty 1

## 2013-08-29 MED ORDER — FAMOTIDINE IN NACL 20-0.9 MG/50ML-% IV SOLN
INTRAVENOUS | Status: AC
  Filled 2013-08-29: qty 50

## 2013-08-29 NOTE — Progress Notes (Signed)
08/29/13 @ 9:00 am, BMS [id number], Cycle 2, Day 1:  Laura Hester into the [facility] for a CBC and to receive elotuzumab.  She reports having taken dexamethasone 28 mg orally at 5:00 am today.  She is feeling well, and her CBC was appropriate to proceed with treatment.  Her weight today was 73.5 kg and her dose of elotuzumab was calculated at 735 mg based on that weight.  Drug assignment was made based on that weight through [facility], and assignment provided to Kennith Center, PharmD.  Spoke with Adria Dill, RN in the infusion room about Laura Hester's treatment.  She was given a sign for the pump with details of the rate, the timing of premeds, and the use of a 0.22 micron filter.  Reminded Mrs. Oo to restart lenalidomide this evening.  09/06/13 @ 9:30 am, Cycle 2, Day 8 (acutally Day 9 due to [facility] Day closing):  Laura Hester into the [facility] for a CBC, to see Dr. Jana Hakim and to receive elotuzumab. She reported having taken dexamethasone 28 mg orally at 5:30 am today.  She is taking lenadidomide as directed at dose level 0.  Obtained kit assignment through [facility] and gave that to Montel Clock, PharmD.  Spoke with Corinda Gubler, RN in the infusion room about treatment today.  Alerted her to the use of the 0.22 micron filter and the requirement for having pre-meds in at least 45 minutes, but no more than 90 minutes, before starting the elotuzumab.  Provided her with a sign for the pump.  09/12/13 @ 9:00 am, Cycle 2, Day 15:  Laura Hester into the [facility] for labs and to receive elotuzumab.  She reported having taken dexamethasone 28 mg orally this morning at 4:30 am.  She is doing well with no complaints.  Reviewed with her the treatment calendar and reminded her that 09/18/13 will be the last dose of lenalidomide this cycle.  Spoke with Kellie Simmering, RN in the infusion room about the treatment in detail.  Alerted her to the requirements for pre-meds and the use of a 0.22 micron filter.  Provided her with  a sign for the pump.  Obtained medication kit assignment through the [name] system and gave those to Kennith Center, PharmD.  09/19/13 @ 9:15 am, Cycle 2, Day 22:  Laura Hester into the [facility] for treatment with elotuzumab.  She had a CBC prior to treatment.  Her hemoglobin continues to improve and her white count and platelets are WNLs.  He reports having had a cold over the last week with no fever, but some cough.  She sounds congested today, but reports symptoms are improving.  She reported having taken dexamethasone 28 mg at 6:00 am today.  Obtained kit assignment through [facility] and gave results to Kennith Center, PharmD. Reminded Mrs. Shannahan that she will begin her week's rest from lenalidomide today.  We also discussed the schedule for Cycle 3 in which she received elotuzumab only on Days 1 and 15, and takes dexamethasone 40 mg on days 8 and 22.  Provided her with written instructions for all of the upcoming cycle. Spoke with Tonye Royalty, RN in the infusion area about patient's treatment. Emphasized the timing of the premeds and the use of a 0.22 micron filter.  Provided her with a sign with the same information.

## 2013-08-29 NOTE — Patient Instructions (Addendum)
[facility] Discharge Instructions for Patients Receiving Chemotherapy  Today you received the following chemotherapy agents Elotuzumab.  To help prevent nausea and vomiting after your treatment, we encourage you to take your nausea medication as prescribed/directed.   If you develop nausea and vomiting that is not controlled by your nausea medication, call the clinic.   BELOW ARE SYMPTOMS THAT SHOULD BE REPORTED IMMEDIATELY:  *FEVER GREATER THAN 100.5 F  *CHILLS WITH OR WITHOUT FEVER  NAUSEA AND VOMITING THAT IS NOT CONTROLLED WITH YOUR NAUSEA MEDICATION  *UNUSUAL SHORTNESS OF BREATH  *UNUSUAL BRUISING OR BLEEDING  TENDERNESS IN MOUTH AND THROAT WITH OR WITHOUT PRESENCE OF ULCERS  *URINARY PROBLEMS  *BOWEL PROBLEMS  UNUSUAL RASH Items with * indicate a potential emergency and should be followed up as soon as possible.  Feel free to call the clinic you have any questions or concerns. The clinic phone number is [phone].

## 2013-09-06 ENCOUNTER — Other Ambulatory Visit (HOSPITAL_BASED_OUTPATIENT_CLINIC_OR_DEPARTMENT_OTHER): Payer: BC Managed Care – PPO

## 2013-09-06 ENCOUNTER — Ambulatory Visit (HOSPITAL_BASED_OUTPATIENT_CLINIC_OR_DEPARTMENT_OTHER): Payer: BC Managed Care – PPO

## 2013-09-06 ENCOUNTER — Telehealth: Payer: Self-pay | Admitting: *Deleted

## 2013-09-06 ENCOUNTER — Telehealth: Payer: Self-pay | Admitting: Oncology

## 2013-09-06 ENCOUNTER — Other Ambulatory Visit: Payer: Self-pay | Admitting: *Deleted

## 2013-09-06 ENCOUNTER — Ambulatory Visit (HOSPITAL_BASED_OUTPATIENT_CLINIC_OR_DEPARTMENT_OTHER): Payer: BC Managed Care – PPO | Admitting: Oncology

## 2013-09-06 VITALS — BP 152/73 | HR 101 | Temp 98.7°F | Resp 18 | Ht 62.0 in | Wt 160.1 lb

## 2013-09-06 DIAGNOSIS — J45909 Unspecified asthma, uncomplicated: Secondary | ICD-10-CM

## 2013-09-06 DIAGNOSIS — C9000 Multiple myeloma not having achieved remission: Secondary | ICD-10-CM

## 2013-09-06 DIAGNOSIS — D630 Anemia in neoplastic disease: Secondary | ICD-10-CM

## 2013-09-06 DIAGNOSIS — Z5112 Encounter for antineoplastic immunotherapy: Secondary | ICD-10-CM

## 2013-09-06 DIAGNOSIS — M545 Low back pain, unspecified: Secondary | ICD-10-CM

## 2013-09-06 DIAGNOSIS — D649 Anemia, unspecified: Secondary | ICD-10-CM

## 2013-09-06 LAB — CBC WITH DIFFERENTIAL/PLATELET
BASO%: 0.9 % (ref 0.0–2.0)
Basophils Absolute: 0.1 10*3/uL (ref 0.0–0.1)
EOS%: 6.9 % (ref 0.0–7.0)
Eosinophils Absolute: 0.6 10*3/uL — ABNORMAL HIGH (ref 0.0–0.5)
HEMATOCRIT: 31.9 % — AB (ref 34.8–46.6)
HGB: 9.7 g/dL — ABNORMAL LOW (ref 11.6–15.9)
LYMPH%: 4.6 % — AB (ref 14.0–49.7)
MCH: 27.7 pg (ref 25.1–34.0)
MCHC: 30.4 g/dL — AB (ref 31.5–36.0)
MCV: 91.1 fL (ref 79.5–101.0)
MONO#: 0.8 10*3/uL (ref 0.1–0.9)
MONO%: 8.7 % (ref 0.0–14.0)
NEUT#: 7 10*3/uL — ABNORMAL HIGH (ref 1.5–6.5)
NEUT%: 78.9 % — AB (ref 38.4–76.8)
PLATELETS: 300 10*3/uL (ref 145–400)
RBC: 3.5 10*6/uL — ABNORMAL LOW (ref 3.70–5.45)
RDW: 21 % — ABNORMAL HIGH (ref 11.2–14.5)
WBC: 8.8 10*3/uL (ref 3.9–10.3)
lymph#: 0.4 10*3/uL — ABNORMAL LOW (ref 0.9–3.3)
nRBC: 0 % (ref 0–0)

## 2013-09-06 MED ORDER — ACETAMINOPHEN 325 MG PO TABS
ORAL_TABLET | ORAL | Status: AC
  Filled 2013-09-06: qty 2

## 2013-09-06 MED ORDER — FAMOTIDINE IN NACL 20-0.9 MG/50ML-% IV SOLN
INTRAVENOUS | Status: AC
  Filled 2013-09-06: qty 50

## 2013-09-06 MED ORDER — FAMOTIDINE IN NACL 20-0.9 MG/50ML-% IV SOLN
20.0000 mg | Freq: Once | INTRAVENOUS | Status: AC
  Administered 2013-09-06: 20 mg via INTRAVENOUS

## 2013-09-06 MED ORDER — DIPHENHYDRAMINE HCL 25 MG PO CAPS
25.0000 mg | ORAL_CAPSULE | Freq: Once | ORAL | Status: AC
  Administered 2013-09-06: 25 mg via ORAL

## 2013-09-06 MED ORDER — DEXAMETHASONE SODIUM PHOSPHATE 10 MG/ML IJ SOLN
8.0000 mg | Freq: Once | INTRAMUSCULAR | Status: AC
  Administered 2013-09-06: 8 mg via INTRAVENOUS

## 2013-09-06 MED ORDER — DTAP-HEPATITIS B RECOMB-IPV IM SUSP: 0.5000 mL | Freq: Once | INTRAMUSCULAR | Status: DC

## 2013-09-06 MED ORDER — SODIUM CHLORIDE 0.9 % IV SOLN
Freq: Once | INTRAVENOUS | Status: AC
  Administered 2013-09-06: 10:00:00 via INTRAVENOUS

## 2013-09-06 MED ORDER — DIPHENHYDRAMINE HCL 25 MG PO CAPS
ORAL_CAPSULE | ORAL | Status: AC
  Filled 2013-09-06: qty 1

## 2013-09-06 MED ORDER — SODIUM CHLORIDE 0.9 % IV SOLN
10.0000 mg/kg | Freq: Once | INTRAVENOUS | Status: AC
  Administered 2013-09-06: 735 mg via INTRAVENOUS
  Filled 2013-09-06: qty 29.4

## 2013-09-06 MED ORDER — PNEUMOCOCCAL VAC POLYVALENT 25 MCG/0.5ML IJ INJ: 0.5000 mL | INJECTION | INTRAMUSCULAR | Status: DC

## 2013-09-06 MED ORDER — HEPARIN SOD (PORK) LOCK FLUSH 100 UNIT/ML IV SOLN
500.0000 [IU] | Freq: Once | INTRAVENOUS | Status: AC | PRN
  Administered 2013-09-06: 500 [IU]
  Filled 2013-09-06: qty 5

## 2013-09-06 MED ORDER — SODIUM CHLORIDE 0.9 % IJ SOLN
10.0000 mL | INTRAMUSCULAR | Status: DC | PRN
  Administered 2013-09-06: 10 mL
  Filled 2013-09-06: qty 10

## 2013-09-06 MED ORDER — DEXAMETHASONE SODIUM PHOSPHATE 10 MG/ML IJ SOLN
INTRAMUSCULAR | Status: AC
  Filled 2013-09-06: qty 1

## 2013-09-06 MED ORDER — HAEMOPHILUS B POLYSAC CONJ VAC IM SOLR: 0.5000 mL | Freq: Once | INTRAMUSCULAR | Status: DC

## 2013-09-06 MED ORDER — ACETAMINOPHEN 325 MG PO TABS
650.0000 mg | ORAL_TABLET | Freq: Once | ORAL | Status: AC
  Administered 2013-09-06: 650 mg via ORAL

## 2013-09-06 NOTE — Patient Instructions (Signed)
[facility] Discharge Instructions for Patients Receiving Chemotherapy  Today you received the following chemotherapy agents Elotuzumab.  To help prevent nausea and vomiting after your treatment, we encourage you to take your nausea medication as prescribed.   If you develop nausea and vomiting that is not controlled by your nausea medication, call the clinic.   BELOW ARE SYMPTOMS THAT SHOULD BE REPORTED IMMEDIATELY:  *FEVER GREATER THAN 100.5 F  *CHILLS WITH OR WITHOUT FEVER  NAUSEA AND VOMITING THAT IS NOT CONTROLLED WITH YOUR NAUSEA MEDICATION  *UNUSUAL SHORTNESS OF BREATH  *UNUSUAL BRUISING OR BLEEDING  TENDERNESS IN MOUTH AND THROAT WITH OR WITHOUT PRESENCE OF ULCERS  *URINARY PROBLEMS  *BOWEL PROBLEMS  UNUSUAL RASH Items with * indicate a potential emergency and should be followed up as soon as possible.  Feel free to call the clinic you have any questions or concerns. The clinic phone number is [phone].    

## 2013-09-06 NOTE — Telephone Encounter (Signed)
Per staff message and POF I have scheduled appts.  JMW  

## 2013-09-06 NOTE — Progress Notes (Signed)
ID: Laura Hester   DOB: Jan 14, 1956  MR#: [id number]  PYK#:[id number]  PCP: Gara Kroner, MD GYN: Princess Bruins, MD SU: OTHER MD: Precious Haws, MD at [facility] (fax # [phone]), Rae Mar  CHIEF COMPLAINT:  Multiple Myeloma TREATMENT: on study protocol BMS 143  HISTORY OF PRESENT ILLNESS: Laura Hester has a history of iron deficiency anemia secondary to menorrhagia. However, as this persisted despite near-total cessation of her menses and despite iron supplementation, she was referred to Dr. Teena Irani for GI evaluation. He found the patient's Hb to have decreased from 9.17 June 2011 to 8.3 07/29/2011. MCV was 95.9, ferritin 93.3, with a normal folate and negative ANA. Guaiacs were negative. Creatinine was 0.77.  t-transglutaminase IgA was normal @ 2, but the total IgA was 6.9 g. Accordingly on 08/04/2011 an SPEP was obtained, showing an M-spike of 4.5 g, with a secondary M-spike of 0.4 g. Total protein was 10.0 with albumin 3.2, calcium 10.2. Bone survey 08/26/2011 showed multiple bone lesions consistent with myeloma and bone marrow biopsy 09/01/2011 confirmed the diagnosis with 75% myeloma cells in the marrow.   Laura Hester' subsequent history is detailed below.  INTERVAL HISTORY: Laura Hester returns today for followup of her multiple myeloma. Her husband [name], who usually accompanies her, is in [place] with one of their daughters this week. Today is day 8 cycle 2 of her triple therapy according to BMS143, namely elotuzumab weekly, lenalidomide 25 mg daily for 21 days then off a week (cycle 2 started 08/29/2013) and dexamethasone 40 mg weekly.    REVIEW OF SYSTEMS: Overall she is tolerating the treatment "great". Her fatigue is much better now that her hemoglobin is closer to 10. She was able to walk or 3 flights of stairs at home, even though she got a bit winded doing it. [place] also [place] and did a lot of walking over there. She has occasional back pain and other joint pains  but is not taking any pain medicine at present. She has had no headaches, no visual changes, no nausea or vomiting. She takes the Revlimid at bedtime and it makes her sleepy. Accordingly she is no longer using the lorazepam every night. There is no peripheral neuropathy. There has been no constipation. A detailed review of systems today was otherwise stable  PAST MEDICAL HISTORY: Past Medical History  Diagnosis Date  . Fibrocystic breast disease   . Allergic rhinitis   . HTN (hypertension)     mild/ observation only  . Prediabetes   . SUI (stress urinary incontinence, female)   . Overweight (BMI 25.0-29.9)   . Multiple myeloma, stage 3 08/20/11 dx  . Anemia     resolved  . Cancer   . Arthritis     PAST SURGICAL HISTORY: Past Surgical History  Procedure Laterality Date  . Cholecystectomy  1995  . Tonsillectomy and adenoidectomy    . Carpal tunnel release      FAMILY HISTORY Family History  Problem Relation Age of Onset  . Heart disease Father   . Breast cancer Mother   . Heart disease Mother   . Diabetes Paternal Grandfather   . Hypertension Maternal Grandmother   . Hypertension Brother   The patient's parents are in their mid-20s.Her mother has a remote history of breast cancer. She has two brothers, one her twin; no sisters. There is no other history of cancer in the family to her knowledge  GYNECOLOGIC HISTORY: Menarche age 74, first live birth age 58, [place] P2. Last period  was January 2013, before that May 2012.  SOCIAL HISTORY: (Updated  07/04/2013) She is a Forensic scientist and taught kindergarten until 2012. Her husband [name], graduated from [employer] and works in Technical brewer.  Daughter [name] works for [employer], married in 2013. Daughter [name] studies and works in [place] and comes home every other week and The patient attends the [employer]: Not in place  HEALTH MAINTENANCE: (Updated 07/04/2013) History   Substance Use Topics  . Smoking status: Never Smoker   . Smokeless tobacco: Never Used  . Alcohol Use: No     Colonoscopy: 2009/[facility]  PAP: March 2014, Dr. Dellis Filbert  Bone density: never  Lipid panel: [place]/ "excellent"  Mammography: June of 2014    Allergies  Allergen Reactions  . Wellbutrin [Bupropion Hcl] Hives    Current Outpatient Prescriptions  Medication Sig Dispense Refill  . dexamethasone (DECADRON) 4 MG tablet Take 10 tablets (40 mg total) by mouth once a week. Take day before IV chemo  40 tablet  6  . fluconazole (DIFLUCAN) 100 MG tablet Take 1 tablet (100 mg total) by mouth daily.  20 tablet  3  . lenalidomide (REVLIMID) 25 MG capsule Take 1 capsule (25 mg total) by mouth daily.  21 capsule  6  . lidocaine-prilocaine (EMLA) cream Apply topically as needed.  30 g  3  . LORazepam (ATIVAN) 0.5 MG tablet Take 1 tablet (0.5 mg total) by mouth every 6 (six) hours as needed (Nausea or vomiting).  30 tablet  0  . naproxen sodium (ANAPROX) 220 MG tablet Take 1 tablet (220 mg total) by mouth 2 (two) times daily with a meal.  60 tablet  6  . omeprazole (PRILOSEC) 20 MG capsule Take 1 capsule (20 mg total) by mouth daily.  30 capsule  12  . ondansetron (ZOFRAN) 8 MG tablet Take 1 tablet (8 mg total) by mouth 2 (two) times daily as needed (Nausea or vomiting).  30 tablet  1  . Potassium Chloride ER 20 MEQ TBCR TAKE 1 TABLET (20 MEQ TOTAL) BY MOUTH DAILY.  90 tablet  0  . prochlorperazine (COMPAZINE) 10 MG tablet Take 1 tablet (10 mg total) by mouth every 6 (six) hours as needed (Nausea or vomiting).  30 tablet  1  . sulfamethoxazole-trimethoprim (BACTRIM DS) 800-160 MG per tablet Take 2 tablets by mouth 2 (two) times a week. Monday  AND THURSDAY  30 tablet  4  . traMADol (ULTRAM) 50 MG tablet Take 1 tablet (50 mg total) by mouth every 6 (six) hours as needed.  30 tablet  3  . valACYclovir (VALTREX) 500 MG tablet Take 1 tablet (500 mg total) by mouth daily.  30 tablet  12   No  current facility-administered medications for this visit.   Facility-Administered Medications Ordered in Other Visits  Medication Dose Route Frequency Provider Last Rate Last Dose  . sodium chloride 0.9 % injection 10 mL  10 mL Intracatheter PRN Amy G Berry, PA-C   10 mL at 05/30/13 1640  . sodium chloride 0.9 % injection 10 mL  10 mL Intravenous PRN Chauncey Cruel, MD   10 mL at 08/08/13 1518  . sodium chloride 0.9 % injection 10 mL  10 mL Intravenous PRN Chauncey Cruel, MD   10 mL at 08/22/13 1329   OBJECTIVE: Middle-aged white woman in no acute distress Filed Vitals:   09/06/13 0851  BP: 152/73  Pulse: 101  Temp: 98.7 F (37.1  C)  Resp: 18     Body mass index is 29.28 kg/(m^2).     ECOG FS: 1  Filed Weights   09/06/13 0851  Weight: 160 lb 1.6 oz (72.621 kg)   Sclerae unicteric, pupils round and reactive Oropharynx clear and moist, dentition in good repair No cervical or supraclavicular adenopathy Lungs no rales or rhonchi Heart regular rate and rhythm, no murmur appreciated Abd soft, nontender, positive bowel sounds, no masses palpated MSK mild kyphosis but no focal spinal tenderness  Neuro: nonfocal, well oriented, positive affect Breasts: Deferred   LAB RESULTS: Results for Laura, Hester (MRN [id number]) as of 09/06/2013 09:59  Ref. Range 05/30/2013 12:16 06/27/2013 14:05 08/08/2013 11:33 08/15/2013 08:18 08/22/2013 09:10  M-SPIKE, % No range found 0.47 0.55 0.78 0.41 0.24  Results for Laura, Hester (MRN [id number]) as of 09/06/2013 09:59  Ref. Range 05/30/2013 12:16 06/27/2013 14:05 08/08/2013 11:33 08/15/2013 08:18 08/22/2013 09:10  Lambda Free Lght Chn Latest Range: 0.57-2.63 mg/dL 13.60 (H) 29.10 (H) 8.22 (H) 4.30 (H) 6.50 (H)    Lab Results  Component Value Date   WBC 8.8 09/06/2013   NEUTROABS 7.0* 09/06/2013   HGB 9.7* 09/06/2013   HCT 31.9* 09/06/2013   MCV 91.1 09/06/2013   PLT 300 09/06/2013      Chemistry      Component Value Date/Time   NA 143  08/22/2013 0910   NA 143 12/01/2011 0836   K 3.2* 08/22/2013 0910   K 4.0 12/01/2011 0836   CL 107 09/27/2012 0842   CL 105 12/01/2011 0836   CO2 24 08/22/2013 0910   CO2 29 12/01/2011 0836   BUN 7.0 08/22/2013 0910   BUN 12 12/01/2011 0836   CREATININE 0.6 08/22/2013 0910   CREATININE 0.52 12/01/2011 0836      Component Value Date/Time   CALCIUM 7.2* 08/22/2013 0910   CALCIUM 8.9 12/01/2011 0836   ALKPHOS 238* 08/22/2013 0910   ALKPHOS 110 12/01/2011 0836   AST 25 08/22/2013 0910   AST 15 12/01/2011 0836   ALT 52 08/22/2013 0910   ALT 16 12/01/2011 0836   BILITOT 0.43 08/22/2013 0910   BILITOT 0.3 12/01/2011 0836     STUDIES: No results found.   ASSESSMENT: 58 y.o.  Laura Hester woman presenting with anemia, found to have an IgA >6g, with an M-spike of 4.5 g (and a second M-spike of 0.4g), urine IFE showing IgA-lambda and lambda light chains, creatinine 0.77, calcium 10.0, albumin 3.1, and beta-2-microglobulin 5.51; with bone survey showing multiple myeloma lesions and bone marrow biopsy 09/01/2011 showing 75% myeloma cells. 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  (2) on prophylactic coumadin, discontinued as of 12/13/2011.  (3) zolendronic acid every 28 days, 09/19/2011 to 01/05/2012 (five doses)  (4) s/p neupogen mobilization October 2013, with a collection of 4.26 x10^6 CD34 positive cells  (5) status post melphalan at 200 mg/M2 on 02/23/2012, followed by autologous stem cell transplant the following day.  (6) on 06/07/2012 started maintenance bortezomib sq at 1.3 mg/M2 Q2w, with bactrim and acyclovir prophylaxis, the plan being to contuinue this for 2 years as per the HOVON trial  (7) zolendronic acid, given monthly, resumed June 2014, most recent dose 08/15/2013  (8) disease progression noted with a  rise in the IgA lambda fraction September 2014  (9)  treated according to the [facility] S1304 randomized to the high dose arm, receiving carfilzomib  on days 1, 2, 8, 9, 15, and 16 of every 28 day cycle. Cycle 1 given at a reduced dose of 20 mg per meter square, with subsequent cycles given at 56 mg per meter square.  Regimen started 02/07/2013, discontinued as of 07/04/2013 (day 8 cycle 6) due to evidence of progression.  (10 enrolled in BMS 143, receiving elotuzumab weekly, with dexamethasone 40 mg weekly (taken on any non-elituzumab day) and lenalidomide at 25 mg/day for 21 days of each 28 day cycle, starting 08/01/2013  (a) prophylactic ASA 325 mg started with the first cycle  (b) prophylactic valacyclovir and trimethoprim-sulfamethoxazole continued as before  (c) received DTaP, Hep B and inactivated polio (Pediarix), H-flu and pneumococcal vacccines (HIb and PCV13) on   07/14/2013 at [facility]; to be repeated 09/13/2013 and October 2015   PLAN:  Laura Hester is tolerating her treatment well, and appears to be having a good response. I do not have an explanation as to why the lambda light chains were slightly up while the M spike was clearly down. Certainly the overall trend is showing a response.  She is doing much better now that her hemoglobin is closer to 10. It could be that the Revlimid was contributing to the anemia (she was off for a week, which may have helped), or it could be that it was mostly the disease and that it is a bit better. We will continue to follow.  She needs to have her vaccines updated and that will be done next Tuesday. The orders have been entered  I am going to start seeing her at the beginning of each cycle, which is going to be specifically June 15.  Laura Hester has a good understanding of the overall plan. She agrees with that. She knows a goal of treatment in her case is control. She will call with any problems that may develop before her next visit here.      Chauncey Cruel, MD     09/06/2013

## 2013-09-06 NOTE — Telephone Encounter (Signed)
, °

## 2013-09-07 ENCOUNTER — Encounter: Payer: Self-pay | Admitting: *Deleted

## 2013-09-07 ENCOUNTER — Other Ambulatory Visit: Payer: Self-pay

## 2013-09-07 DIAGNOSIS — Z1231 Encounter for screening mammogram for malignant neoplasm of breast: Secondary | ICD-10-CM

## 2013-09-07 NOTE — Progress Notes (Signed)
09/07/13 Late Entry for 09/06/13 @ 9:00 am, Consent Forms Changes for SWOG S1304:   Met with Laura Hester prior to her current treatment to discuss changes in the [facility] S1304 consent, approval date of 08/03/13.  Using a tracked copy we went through all the changes to the side effect profile of carfilzomib that have been incorporated into the new consent.  All of her questions were answered.  She completed treatment with carfilzomib June 28, 2013 and is currently being treated with lenalidomide, dexamethasone and elotuzumab per the BMS CA204-143 EAP.

## 2013-09-12 ENCOUNTER — Other Ambulatory Visit (HOSPITAL_BASED_OUTPATIENT_CLINIC_OR_DEPARTMENT_OTHER): Payer: BC Managed Care – PPO

## 2013-09-12 ENCOUNTER — Ambulatory Visit (HOSPITAL_BASED_OUTPATIENT_CLINIC_OR_DEPARTMENT_OTHER): Payer: BC Managed Care – PPO

## 2013-09-12 ENCOUNTER — Ambulatory Visit: Payer: BC Managed Care – PPO

## 2013-09-12 VITALS — BP 132/58 | HR 98 | Temp 97.9°F

## 2013-09-12 DIAGNOSIS — D630 Anemia in neoplastic disease: Secondary | ICD-10-CM

## 2013-09-12 DIAGNOSIS — C9000 Multiple myeloma not having achieved remission: Secondary | ICD-10-CM

## 2013-09-12 DIAGNOSIS — Z95828 Presence of other vascular implants and grafts: Secondary | ICD-10-CM

## 2013-09-12 DIAGNOSIS — Z5112 Encounter for antineoplastic immunotherapy: Secondary | ICD-10-CM

## 2013-09-12 LAB — CBC WITH DIFFERENTIAL/PLATELET
BASO%: 0.3 % (ref 0.0–2.0)
BASOS ABS: 0 10*3/uL (ref 0.0–0.1)
EOS ABS: 0.5 10*3/uL (ref 0.0–0.5)
EOS%: 7.8 % — AB (ref 0.0–7.0)
HEMATOCRIT: 31.6 % — AB (ref 34.8–46.6)
HEMOGLOBIN: 9.6 g/dL — AB (ref 11.6–15.9)
LYMPH#: 0.3 10*3/uL — AB (ref 0.9–3.3)
LYMPH%: 4.4 % — ABNORMAL LOW (ref 14.0–49.7)
MCH: 27.9 pg (ref 25.1–34.0)
MCHC: 30.4 g/dL — ABNORMAL LOW (ref 31.5–36.0)
MCV: 91.9 fL (ref 79.5–101.0)
MONO#: 0.5 10*3/uL (ref 0.1–0.9)
MONO%: 7.5 % (ref 0.0–14.0)
NEUT%: 80 % — AB (ref 38.4–76.8)
NEUTROS ABS: 5.1 10*3/uL (ref 1.5–6.5)
Platelets: 311 10*3/uL (ref 145–400)
RBC: 3.44 10*6/uL — ABNORMAL LOW (ref 3.70–5.45)
RDW: 20.8 % — ABNORMAL HIGH (ref 11.2–14.5)
WBC: 6.4 10*3/uL (ref 3.9–10.3)

## 2013-09-12 LAB — COMPREHENSIVE METABOLIC PANEL (CC13)
ALT: 30 U/L (ref 0–55)
ANION GAP: 13 meq/L — AB (ref 3–11)
AST: 14 U/L (ref 5–34)
Albumin: 2.4 g/dL — ABNORMAL LOW (ref 3.5–5.0)
Alkaline Phosphatase: 89 U/L (ref 40–150)
BILIRUBIN TOTAL: 0.5 mg/dL (ref 0.20–1.20)
BUN: 8.5 mg/dL (ref 7.0–26.0)
CHLORIDE: 110 meq/L — AB (ref 98–109)
CO2: 19 meq/L — AB (ref 22–29)
Calcium: 8.4 mg/dL (ref 8.4–10.4)
Creatinine: 0.6 mg/dL (ref 0.6–1.1)
GLUCOSE: 154 mg/dL — AB (ref 70–140)
Potassium: 3.9 mEq/L (ref 3.5–5.1)
SODIUM: 142 meq/L (ref 136–145)
TOTAL PROTEIN: 5.9 g/dL — AB (ref 6.4–8.3)

## 2013-09-12 MED ORDER — ACETAMINOPHEN 325 MG PO TABS
650.0000 mg | ORAL_TABLET | Freq: Once | ORAL | Status: AC
  Administered 2013-09-12: 650 mg via ORAL

## 2013-09-12 MED ORDER — DIPHENHYDRAMINE HCL 25 MG PO CAPS
25.0000 mg | ORAL_CAPSULE | Freq: Once | ORAL | Status: AC
  Administered 2013-09-12: 25 mg via ORAL

## 2013-09-12 MED ORDER — DEXAMETHASONE SODIUM PHOSPHATE 10 MG/ML IJ SOLN
8.0000 mg | Freq: Once | INTRAMUSCULAR | Status: AC
  Administered 2013-09-12: 8 mg via INTRAVENOUS

## 2013-09-12 MED ORDER — SODIUM CHLORIDE 0.9 % IJ SOLN
10.0000 mL | INTRAMUSCULAR | Status: DC | PRN
  Administered 2013-09-12: 10 mL
  Filled 2013-09-12: qty 10

## 2013-09-12 MED ORDER — DIPHENHYDRAMINE HCL 25 MG PO CAPS
ORAL_CAPSULE | ORAL | Status: AC
  Filled 2013-09-12: qty 1

## 2013-09-12 MED ORDER — ZOLEDRONIC ACID 4 MG/100ML IV SOLN
4.0000 mg | Freq: Once | INTRAVENOUS | Status: AC
  Administered 2013-09-12: 4 mg via INTRAVENOUS
  Filled 2013-09-12: qty 100

## 2013-09-12 MED ORDER — FAMOTIDINE IN NACL 20-0.9 MG/50ML-% IV SOLN
20.0000 mg | Freq: Once | INTRAVENOUS | Status: AC
  Administered 2013-09-12: 20 mg via INTRAVENOUS

## 2013-09-12 MED ORDER — FAMOTIDINE IN NACL 20-0.9 MG/50ML-% IV SOLN
INTRAVENOUS | Status: AC
  Filled 2013-09-12: qty 50

## 2013-09-12 MED ORDER — SODIUM CHLORIDE 0.9 % IJ SOLN
10.0000 mL | INTRAMUSCULAR | Status: DC | PRN
  Administered 2013-09-12: 10 mL via INTRAVENOUS
  Filled 2013-09-12: qty 10

## 2013-09-12 MED ORDER — ACETAMINOPHEN 325 MG PO TABS
ORAL_TABLET | ORAL | Status: AC
  Filled 2013-09-12: qty 2

## 2013-09-12 MED ORDER — HEPARIN SOD (PORK) LOCK FLUSH 100 UNIT/ML IV SOLN
500.0000 [IU] | Freq: Once | INTRAVENOUS | Status: AC
  Administered 2013-09-12: 500 [IU] via INTRAVENOUS
  Filled 2013-09-12: qty 5

## 2013-09-12 MED ORDER — SODIUM CHLORIDE 0.9 % IV SOLN
10.0000 mg/kg | Freq: Once | INTRAVENOUS | Status: AC
  Administered 2013-09-12: 735 mg via INTRAVENOUS
  Filled 2013-09-12: qty 29.4

## 2013-09-12 MED ORDER — HEPARIN SOD (PORK) LOCK FLUSH 100 UNIT/ML IV SOLN
500.0000 [IU] | Freq: Once | INTRAVENOUS | Status: AC | PRN
  Administered 2013-09-12: 500 [IU]
  Filled 2013-09-12: qty 5

## 2013-09-12 MED ORDER — DEXAMETHASONE SODIUM PHOSPHATE 10 MG/ML IJ SOLN
INTRAMUSCULAR | Status: AC
  Filled 2013-09-12: qty 1

## 2013-09-12 MED ORDER — SODIUM CHLORIDE 0.9 % IV SOLN
Freq: Once | INTRAVENOUS | Status: AC
  Administered 2013-09-12: 09:00:00 via INTRAVENOUS

## 2013-09-12 NOTE — Patient Instructions (Signed)
[facility] Discharge Instructions for Patients Receiving Chemotherapy  Today you received the following chemotherapy agents ; Elotuzumab  To help prevent nausea and vomiting after your treatment, we encourage you to take your nausea medication as directed.    If you develop nausea and vomiting that is not controlled by your nausea medication, call the clinic.   BELOW ARE SYMPTOMS THAT SHOULD BE REPORTED IMMEDIATELY:  *FEVER GREATER THAN 100.5 F  *CHILLS WITH OR WITHOUT FEVER  NAUSEA AND VOMITING THAT IS NOT CONTROLLED WITH YOUR NAUSEA MEDICATION  *UNUSUAL SHORTNESS OF BREATH  *UNUSUAL BRUISING OR BLEEDING  TENDERNESS IN MOUTH AND THROAT WITH OR WITHOUT PRESENCE OF ULCERS  *URINARY PROBLEMS  *BOWEL PROBLEMS  UNUSUAL RASH Items with * indicate a potential emergency and should be followed up as soon as possible.  Feel free to call the clinic you have any questions or concerns. The clinic phone number is [phone].    

## 2013-09-12 NOTE — Patient Instructions (Signed)

## 2013-09-13 ENCOUNTER — Encounter (HOSPITAL_COMMUNITY): Payer: Self-pay

## 2013-09-13 ENCOUNTER — Ambulatory Visit (HOSPITAL_BASED_OUTPATIENT_CLINIC_OR_DEPARTMENT_OTHER): Payer: BC Managed Care – PPO

## 2013-09-13 VITALS — BP 136/59 | HR 94 | Temp 98.5°F

## 2013-09-13 DIAGNOSIS — C9000 Multiple myeloma not having achieved remission: Secondary | ICD-10-CM

## 2013-09-13 DIAGNOSIS — M545 Low back pain, unspecified: Secondary | ICD-10-CM

## 2013-09-13 DIAGNOSIS — Z23 Encounter for immunization: Secondary | ICD-10-CM

## 2013-09-13 DIAGNOSIS — J45909 Unspecified asthma, uncomplicated: Secondary | ICD-10-CM

## 2013-09-13 MED ORDER — PNEUMOCOCCAL VAC POLYVALENT 25 MCG/0.5ML IJ INJ
0.5000 mL | INJECTION | Freq: Once | INTRAMUSCULAR | Status: AC
  Administered 2013-09-13: 0.5 mL via INTRAMUSCULAR
  Filled 2013-09-13: qty 0.5

## 2013-09-13 MED ORDER — DTAP-HEPATITIS B RECOMB-IPV IM SUSP
0.5000 mL | Freq: Once | INTRAMUSCULAR | Status: AC
  Administered 2013-09-13: 0.5 mL via INTRAMUSCULAR
  Filled 2013-09-13: qty 0.5

## 2013-09-13 MED ORDER — HAEMOPHILUS B POLYSAC CONJ VAC IM SOLR
0.5000 mL | Freq: Once | INTRAMUSCULAR | Status: AC
  Administered 2013-09-13: 0.5 mL via INTRAMUSCULAR
  Filled 2013-09-13: qty 0.5

## 2013-09-14 LAB — PROTEIN ELECTROPHORESIS, SERUM
ALBUMIN ELP: 46.2 % — AB (ref 55.8–66.1)
Alpha-1-Globulin: 7.8 % — ABNORMAL HIGH (ref 2.9–4.9)
Alpha-2-Globulin: 14.7 % — ABNORMAL HIGH (ref 7.1–11.8)
Beta 2: 15.8 % — ABNORMAL HIGH (ref 3.2–6.5)
Beta Globulin: 7.7 % — ABNORMAL HIGH (ref 4.7–7.2)
GAMMA GLOBULIN: 7.8 % — AB (ref 11.1–18.8)
M-Spike, %: 0.26 g/dL
Total Protein, Serum Electrophoresis: 5.6 g/dL — ABNORMAL LOW (ref 6.0–8.3)

## 2013-09-14 LAB — KAPPA/LAMBDA LIGHT CHAINS
Kappa free light chain: 0.03 mg/dL — ABNORMAL LOW (ref 0.33–1.94)
Kappa:Lambda Ratio: 0 — ABNORMAL LOW (ref 0.26–1.65)
Lambda Free Lght Chn: 12.8 mg/dL — ABNORMAL HIGH (ref 0.57–2.63)

## 2013-09-19 ENCOUNTER — Ambulatory Visit (HOSPITAL_BASED_OUTPATIENT_CLINIC_OR_DEPARTMENT_OTHER): Payer: BC Managed Care – PPO

## 2013-09-19 ENCOUNTER — Other Ambulatory Visit (HOSPITAL_BASED_OUTPATIENT_CLINIC_OR_DEPARTMENT_OTHER): Payer: BC Managed Care – PPO

## 2013-09-19 ENCOUNTER — Other Ambulatory Visit: Payer: Self-pay | Admitting: *Deleted

## 2013-09-19 VITALS — BP 143/73 | HR 93 | Temp 98.1°F

## 2013-09-19 DIAGNOSIS — C9000 Multiple myeloma not having achieved remission: Secondary | ICD-10-CM

## 2013-09-19 DIAGNOSIS — D630 Anemia in neoplastic disease: Secondary | ICD-10-CM

## 2013-09-19 DIAGNOSIS — D649 Anemia, unspecified: Secondary | ICD-10-CM

## 2013-09-19 DIAGNOSIS — Z5112 Encounter for antineoplastic immunotherapy: Secondary | ICD-10-CM

## 2013-09-19 LAB — CBC WITH DIFFERENTIAL/PLATELET
BASO%: 0.8 % (ref 0.0–2.0)
Basophils Absolute: 0.1 10*3/uL (ref 0.0–0.1)
EOS ABS: 0.5 10*3/uL (ref 0.0–0.5)
EOS%: 8.3 % — ABNORMAL HIGH (ref 0.0–7.0)
HEMATOCRIT: 32.9 % — AB (ref 34.8–46.6)
HGB: 10.3 g/dL — ABNORMAL LOW (ref 11.6–15.9)
LYMPH#: 0.4 10*3/uL — AB (ref 0.9–3.3)
LYMPH%: 6.3 % — ABNORMAL LOW (ref 14.0–49.7)
MCH: 28 pg (ref 25.1–34.0)
MCHC: 31.3 g/dL — AB (ref 31.5–36.0)
MCV: 89.4 fL (ref 79.5–101.0)
MONO#: 0.7 10*3/uL (ref 0.1–0.9)
MONO%: 11.8 % (ref 0.0–14.0)
NEUT#: 4.4 10*3/uL (ref 1.5–6.5)
NEUT%: 72.8 % (ref 38.4–76.8)
NRBC: 0 % (ref 0–0)
Platelets: 340 10*3/uL (ref 145–400)
RBC: 3.68 10*6/uL — AB (ref 3.70–5.45)
RDW: 19.7 % — ABNORMAL HIGH (ref 11.2–14.5)
WBC: 6 10*3/uL (ref 3.9–10.3)

## 2013-09-19 MED ORDER — HEPARIN SOD (PORK) LOCK FLUSH 100 UNIT/ML IV SOLN
500.0000 [IU] | Freq: Once | INTRAVENOUS | Status: AC | PRN
Start: 2013-09-19 — End: 2013-09-19
  Administered 2013-09-19: 500 [IU]
  Filled 2013-09-19: qty 5

## 2013-09-19 MED ORDER — ACETAMINOPHEN 325 MG PO TABS
ORAL_TABLET | ORAL | Status: AC
  Filled 2013-09-19: qty 2

## 2013-09-19 MED ORDER — SODIUM CHLORIDE 0.9 % IV SOLN
10.0000 mg/kg | Freq: Once | INTRAVENOUS | Status: AC
  Administered 2013-09-19: 735 mg via INTRAVENOUS
  Filled 2013-09-19: qty 29.4

## 2013-09-19 MED ORDER — DEXAMETHASONE SODIUM PHOSPHATE 10 MG/ML IJ SOLN
INTRAMUSCULAR | Status: AC
  Filled 2013-09-19: qty 1

## 2013-09-19 MED ORDER — DIPHENHYDRAMINE HCL 25 MG PO CAPS
25.0000 mg | ORAL_CAPSULE | Freq: Once | ORAL | Status: AC
  Administered 2013-09-19: 25 mg via ORAL

## 2013-09-19 MED ORDER — SODIUM CHLORIDE 0.9 % IV SOLN
Freq: Once | INTRAVENOUS | Status: AC
  Administered 2013-09-19: 10:00:00 via INTRAVENOUS

## 2013-09-19 MED ORDER — LENALIDOMIDE 25 MG PO CAPS: 25.0000 mg | ORAL_CAPSULE | Freq: Every day | ORAL | Status: DC

## 2013-09-19 MED ORDER — FAMOTIDINE IN NACL 20-0.9 MG/50ML-% IV SOLN
INTRAVENOUS | Status: AC
  Filled 2013-09-19: qty 50

## 2013-09-19 MED ORDER — FAMOTIDINE IN NACL 20-0.9 MG/50ML-% IV SOLN
20.0000 mg | Freq: Once | INTRAVENOUS | Status: AC
  Administered 2013-09-19: 20 mg via INTRAVENOUS

## 2013-09-19 MED ORDER — SODIUM CHLORIDE 0.9 % IJ SOLN
10.0000 mL | INTRAMUSCULAR | Status: DC | PRN
  Administered 2013-09-19: 10 mL
  Filled 2013-09-19: qty 10

## 2013-09-19 MED ORDER — DIPHENHYDRAMINE HCL 25 MG PO CAPS
ORAL_CAPSULE | ORAL | Status: AC
  Filled 2013-09-19: qty 1

## 2013-09-19 MED ORDER — ACETAMINOPHEN 325 MG PO TABS
650.0000 mg | ORAL_TABLET | Freq: Once | ORAL | Status: AC
  Administered 2013-09-19: 650 mg via ORAL

## 2013-09-19 MED ORDER — DEXAMETHASONE SODIUM PHOSPHATE 10 MG/ML IJ SOLN
8.0000 mg | Freq: Once | INTRAMUSCULAR | Status: AC
  Administered 2013-09-19: 8 mg via INTRAVENOUS

## 2013-09-19 NOTE — Patient Instructions (Addendum)
[facility] Discharge Instructions for Patients Receiving Chemotherapy  Today you received the following chemotherapy agents elotuzumab  To help prevent nausea and vomiting after your treatment, we encourage you to take your nausea medication as directed.     If you develop nausea and vomiting that is not controlled by your nausea medication, call the clinic.   BELOW ARE SYMPTOMS THAT SHOULD BE REPORTED IMMEDIATELY:  *FEVER GREATER THAN 100.5 F  *CHILLS WITH OR WITHOUT FEVER  NAUSEA AND VOMITING THAT IS NOT CONTROLLED WITH YOUR NAUSEA MEDICATION  *UNUSUAL SHORTNESS OF BREATH  *UNUSUAL BRUISING OR BLEEDING  TENDERNESS IN MOUTH AND THROAT WITH OR WITHOUT PRESENCE OF ULCERS  *URINARY PROBLEMS  *BOWEL PROBLEMS  UNUSUAL RASH Items with * indicate a potential emergency and should be followed up as soon as possible.  Feel free to call the clinic you have any questions or concerns. The clinic phone number is [phone].  Important Safety Information-For Zometa(last given 09/12/13)  Do not use ZOMETA if you have had a severe allergic reaction to zoledronic acid or any components of ZOMETA. These reactions, including rare cases of hives and angioedema (swelling often near your eyes and lips), very rare cases of life-threatening allergic reactions and severe skin reactions, have been reported. ZOMETA is in a class of drugs called bisphosphonates, and contains the same active ingredient as that found in Reclast (zoledronic acid). If you are treated with ZOMETA, you should not be treated with Reclast. Tell your doctor if you are on other drugs, including aminoglycosides, loop diuretics, calcitonin, drugs that may lower your calcium levels, and drugs which may be harmful to the kidney.  Osteonecrosis of the jaw (ONJ) has been reported mainly in cancer patients treated with intravenous bisphosphonates, including ZOMETA and the risk may increase with longer use of  bisphosphonates. Many of these patients were also receiving anti-cancer drugs and corticosteroids, which may make it more likely to get ONJ. If you have advanced breast cancer or a type of cancer called multiple myeloma, or if you have had dental extraction, periodontal disease, local trauma, including poorly fitting dentures, you may be at greater risk of getting ONJ. Many reports of ONJ involved patients with signs of local infection, including bone/bone marrow inflammation. You should maintain good oral hygiene and have a dental examination with preventive dentistry prior to beginning [place].  While on treatment, avoid invasive dental procedures, if possible, as recovery may take longer. If you develop ONJ while on bisphosphonate therapy, dental surgery may worsen the condition. If you require dental procedures, there are no data available to suggest whether stopping ZOMETA treatment reduces the risk of ONJ. A causal relationship between bisphosphonate use and ONJ has not been established. Based on your condition, your doctor will determine the treatment plan you will receive. Please have your dentist contact us for best treatment decisions regarding any invasive dental procedures. It is not suggested to have any teeth extracted while on Zometa.  Severe and occasionally incapacitating bone, joint, and/or muscle pain has been reported in patients taking bisphosphonates, including ZOMETA. Do not continue using ZOMETA if severe symptoms develop, as some patients had the symptoms reappear after taking ZOMETA or another bisphosphonate again. In aspirin sensitive patients, bronchoconstriction (tightening of the airways in the lungs) has been observed while taking bisphosphonates. Report any hip, thigh, or groin pain to your doctor, as unusual thigh bone fractures have been reported in patients receiving bisphosphonates, including ZOMETA. These fractures may occur with little or no  trauma. It is unknown whether the  risk of fracture continues after stopping therapy.  Your Revlimid refill was sent to [facility] pharmacy today. If you have any questions regarding this prescription, please call them at [phone].  Thanks, Lannette Donath, RN

## 2013-09-21 NOTE — Telephone Encounter (Signed)
[facility] sent facsimile confirmation of Revlimid prescription shipment.  Revlimid was shipped on 09-20-2013 with next business day delivery.

## 2013-09-26 ENCOUNTER — Other Ambulatory Visit: Payer: BC Managed Care – PPO

## 2013-09-26 ENCOUNTER — Other Ambulatory Visit: Payer: Self-pay | Admitting: *Deleted

## 2013-09-26 ENCOUNTER — Ambulatory Visit (HOSPITAL_BASED_OUTPATIENT_CLINIC_OR_DEPARTMENT_OTHER): Payer: BC Managed Care – PPO

## 2013-09-26 ENCOUNTER — Ambulatory Visit (HOSPITAL_BASED_OUTPATIENT_CLINIC_OR_DEPARTMENT_OTHER): Payer: BC Managed Care – PPO | Admitting: Oncology

## 2013-09-26 ENCOUNTER — Encounter: Payer: Self-pay | Admitting: *Deleted

## 2013-09-26 ENCOUNTER — Other Ambulatory Visit (HOSPITAL_BASED_OUTPATIENT_CLINIC_OR_DEPARTMENT_OTHER): Payer: BC Managed Care – PPO

## 2013-09-26 ENCOUNTER — Encounter: Payer: Self-pay | Admitting: Specialist

## 2013-09-26 ENCOUNTER — Telehealth: Payer: Self-pay | Admitting: Oncology

## 2013-09-26 VITALS — BP 135/72 | HR 101 | Temp 98.8°F | Resp 19 | Ht 62.0 in | Wt 156.2 lb

## 2013-09-26 DIAGNOSIS — D500 Iron deficiency anemia secondary to blood loss (chronic): Secondary | ICD-10-CM

## 2013-09-26 DIAGNOSIS — Z5112 Encounter for antineoplastic immunotherapy: Secondary | ICD-10-CM

## 2013-09-26 DIAGNOSIS — N920 Excessive and frequent menstruation with regular cycle: Secondary | ICD-10-CM

## 2013-09-26 DIAGNOSIS — C9000 Multiple myeloma not having achieved remission: Secondary | ICD-10-CM

## 2013-09-26 DIAGNOSIS — D630 Anemia in neoplastic disease: Secondary | ICD-10-CM

## 2013-09-26 DIAGNOSIS — Z95828 Presence of other vascular implants and grafts: Secondary | ICD-10-CM

## 2013-09-26 LAB — CBC WITH DIFFERENTIAL/PLATELET
BASO%: 5 % — AB (ref 0.0–2.0)
Basophils Absolute: 0.4 10*3/uL — ABNORMAL HIGH (ref 0.0–0.1)
EOS%: 1.5 % (ref 0.0–7.0)
Eosinophils Absolute: 0.1 10*3/uL (ref 0.0–0.5)
HCT: 31.7 % — ABNORMAL LOW (ref 34.8–46.6)
HGB: 9.9 g/dL — ABNORMAL LOW (ref 11.6–15.9)
LYMPH%: 8.5 % — ABNORMAL LOW (ref 14.0–49.7)
MCH: 28.1 pg (ref 25.1–34.0)
MCHC: 31.2 g/dL — ABNORMAL LOW (ref 31.5–36.0)
MCV: 90.1 fL (ref 79.5–101.0)
MONO#: 1.3 10*3/uL — AB (ref 0.1–0.9)
MONO%: 17.6 % — ABNORMAL HIGH (ref 0.0–14.0)
NEUT#: 4.8 10*3/uL (ref 1.5–6.5)
NEUT%: 67.4 % (ref 38.4–76.8)
Platelets: 430 10*3/uL — ABNORMAL HIGH (ref 145–400)
RBC: 3.51 10*6/uL — ABNORMAL LOW (ref 3.70–5.45)
RDW: 21.6 % — ABNORMAL HIGH (ref 11.2–14.5)
WBC: 7.1 10*3/uL (ref 3.9–10.3)
lymph#: 0.6 10*3/uL — ABNORMAL LOW (ref 0.9–3.3)

## 2013-09-26 LAB — COMPREHENSIVE METABOLIC PANEL (CC13)
ALT: 18 U/L (ref 0–55)
ANION GAP: 8 meq/L (ref 3–11)
AST: 18 U/L (ref 5–34)
Albumin: 2.5 g/dL — ABNORMAL LOW (ref 3.5–5.0)
Alkaline Phosphatase: 63 U/L (ref 40–150)
BILIRUBIN TOTAL: 0.27 mg/dL (ref 0.20–1.20)
BUN: 10 mg/dL (ref 7.0–26.0)
CO2: 25 meq/L (ref 22–29)
CREATININE: 0.7 mg/dL (ref 0.6–1.1)
Calcium: 9 mg/dL (ref 8.4–10.4)
Chloride: 110 mEq/L — ABNORMAL HIGH (ref 98–109)
GLUCOSE: 116 mg/dL (ref 70–140)
Potassium: 4.3 mEq/L (ref 3.5–5.1)
Sodium: 142 mEq/L (ref 136–145)
Total Protein: 6.5 g/dL (ref 6.4–8.3)

## 2013-09-26 LAB — MAGNESIUM (CC13): Magnesium: 1.7 mg/dl (ref 1.5–2.5)

## 2013-09-26 LAB — LACTATE DEHYDROGENASE (CC13): LDH: 198 U/L (ref 125–245)

## 2013-09-26 MED ORDER — FAMOTIDINE IN NACL 20-0.9 MG/50ML-% IV SOLN
INTRAVENOUS | Status: AC
  Filled 2013-09-26: qty 50

## 2013-09-26 MED ORDER — FAMOTIDINE IN NACL 20-0.9 MG/50ML-% IV SOLN
20.0000 mg | Freq: Once | INTRAVENOUS | Status: AC
  Administered 2013-09-26: 20 mg via INTRAVENOUS

## 2013-09-26 MED ORDER — HEPARIN SOD (PORK) LOCK FLUSH 100 UNIT/ML IV SOLN
500.0000 [IU] | Freq: Once | INTRAVENOUS | Status: AC | PRN
  Administered 2013-09-26: 500 [IU]
  Filled 2013-09-26: qty 5

## 2013-09-26 MED ORDER — ACETAMINOPHEN 325 MG PO TABS
650.0000 mg | ORAL_TABLET | Freq: Once | ORAL | Status: AC
  Administered 2013-09-26: 650 mg via ORAL

## 2013-09-26 MED ORDER — DEXAMETHASONE SODIUM PHOSPHATE 10 MG/ML IJ SOLN
8.0000 mg | Freq: Once | INTRAMUSCULAR | Status: AC
  Administered 2013-09-26: 8 mg via INTRAVENOUS

## 2013-09-26 MED ORDER — ACETAMINOPHEN 325 MG PO TABS
ORAL_TABLET | ORAL | Status: AC
  Filled 2013-09-26: qty 1

## 2013-09-26 MED ORDER — SODIUM CHLORIDE 0.9 % IV SOLN
709.0000 mg | Freq: Once | INTRAVENOUS | Status: AC
  Administered 2013-09-26: 709 mg via INTRAVENOUS
  Filled 2013-09-26: qty 28.4

## 2013-09-26 MED ORDER — ACETAMINOPHEN 325 MG PO TABS
ORAL_TABLET | ORAL | Status: AC
  Filled 2013-09-26: qty 2

## 2013-09-26 MED ORDER — SODIUM CHLORIDE 0.9 % IJ SOLN
10.0000 mL | INTRAMUSCULAR | Status: DC | PRN
  Administered 2013-09-26: 10 mL via INTRAVENOUS
  Filled 2013-09-26: qty 10

## 2013-09-26 MED ORDER — DIPHENHYDRAMINE HCL 25 MG PO CAPS
ORAL_CAPSULE | ORAL | Status: AC
  Filled 2013-09-26: qty 1

## 2013-09-26 MED ORDER — DEXAMETHASONE SODIUM PHOSPHATE 10 MG/ML IJ SOLN
INTRAMUSCULAR | Status: AC
  Filled 2013-09-26: qty 1

## 2013-09-26 MED ORDER — DIPHENHYDRAMINE HCL 25 MG PO CAPS
25.0000 mg | ORAL_CAPSULE | Freq: Once | ORAL | Status: AC
  Administered 2013-09-26: 25 mg via ORAL

## 2013-09-26 MED ORDER — SODIUM CHLORIDE 0.9 % IJ SOLN
10.0000 mL | INTRAMUSCULAR | Status: DC | PRN
  Administered 2013-09-26: 10 mL
  Filled 2013-09-26: qty 10

## 2013-09-26 MED ORDER — SODIUM CHLORIDE 0.9 % IV SOLN
Freq: Once | INTRAVENOUS | Status: AC
  Administered 2013-09-26: 14:00:00 via INTRAVENOUS

## 2013-09-26 NOTE — Patient Instructions (Signed)

## 2013-09-26 NOTE — Patient Instructions (Signed)
[facility] Discharge Instructions for Patients Receiving Chemotherapy  Today you received the following chemotherapy agents elotuzumab  To help prevent nausea and vomiting after your treatment, we encourage you to take your nausea medication as directed   If you develop nausea and vomiting that is not controlled by your nausea medication, call the clinic.   BELOW ARE SYMPTOMS THAT SHOULD BE REPORTED IMMEDIATELY:  *FEVER GREATER THAN 100.5 F  *CHILLS WITH OR WITHOUT FEVER  NAUSEA AND VOMITING THAT IS NOT CONTROLLED WITH YOUR NAUSEA MEDICATION  *UNUSUAL SHORTNESS OF BREATH  *UNUSUAL BRUISING OR BLEEDING  TENDERNESS IN MOUTH AND THROAT WITH OR WITHOUT PRESENCE OF ULCERS  *URINARY PROBLEMS  *BOWEL PROBLEMS  UNUSUAL RASH Items with * indicate a potential emergency and should be followed up as soon as possible.  Feel free to call the clinic you have any questions or concerns. The clinic phone number is [phone].  

## 2013-09-26 NOTE — Telephone Encounter (Signed)
cld pt and adv of time & date of appt per pof-pt understood

## 2013-09-26 NOTE — Progress Notes (Signed)
Met with patient in infusion room. She has been to the Multiple Myeloma support group but did not come to the last one because she was sick. She talked about her illness, how it has impacted her life and relationships, and how she has adjusted to it. Helped her process this by asking clarifying questions. Encouragement and support. Empathetic listening.  Laura Gore, PhD, [place].

## 2013-09-26 NOTE — Progress Notes (Signed)
ID: [name]   DOB: 1955-11-08  MR#: [id number]  KYH#:[id number]  PCP: Gara Kroner, MD GYN: Princess Bruins, MD SU: OTHER MD: Precious Haws, MD at [facility] (fax # [phone]), Rae Mar  CHIEF COMPLAINT:  Multiple Myeloma TREATMENT: on study protocol BMS 143  HISTORY OF MULTIPLE MYELOMA: Laura Hester has a history of iron deficiency anemia secondary to menorrhagia. However, as this persisted despite near-total cessation of her menses and despite iron supplementation, she was referred to Dr. Teena Irani for GI evaluation. He found the patient's Hb to have decreased from 9.17 June 2011 to 8.3 07/29/2011. MCV was 95.9, ferritin 93.3, with a normal folate and negative ANA. Guaiacs were negative. Creatinine was 0.77.  t-transglutaminase IgA was normal @ 2, but the total IgA was 6.9 g. Accordingly on 08/04/2011 an SPEP was obtained, showing an M-spike of 4.5 g, with a secondary M-spike of 0.4 g. Total protein was 10.0 with albumin 3.2, calcium 10.2. Bone survey 08/26/2011 showed multiple bone lesions consistent with myeloma and bone marrow biopsy 09/01/2011 confirmed the diagnosis with 75% myeloma cells in the marrow.   Journi' subsequent history is detailed below.  INTERVAL HISTORY: Laura Hester returns today for followup of her multiple myeloma accompanied by her husband [name].Today is day 1 cycle 3 of her triple therapy according to BMS143, namely elotuzumab weekly, lenalidomide 25 mg daily for 21 days then off a week (cycle 2 started 08/29/2013) and dexamethasone 40 mg weekly.    REVIEW OF SYSTEMS: Laura Hester is doing "great". This is her week off Revlimid and she always feels cold lighter" off that medication. She takes it around 10 PM and it helps her sleep. It does not constipate her and she is now off all bowel prophylaxis. She'll also does not need a sleeping pill. She denies any fever, bleeding, or rash. She denies pain and specifically her back pain is "gone". She does not report any  peripheral neuropathy. She takes a nap usually on Tuesdays after the Monday treatments but the rest of the time she does her home work and is trying to start a little bit of an exercise program. She was able to climb 3 flights of stairs to her daughter's apartment in [place] without difficulty. She also tells me she went to her dentist because she thought she had to do tooth, but everything is "just great". A detailed review of systems today was otherwise noncontributory  PAST MEDICAL HISTORY: Past Medical History  Diagnosis Date  . Fibrocystic breast disease   . Allergic rhinitis   . HTN (hypertension)     mild/ observation only  . Prediabetes   . SUI (stress urinary incontinence, female)   . Overweight (BMI 25.0-29.9)   . Multiple myeloma, stage 3 08/20/11 dx  . Anemia     resolved  . Cancer   . Arthritis     PAST SURGICAL HISTORY: Past Surgical History  Procedure Laterality Date  . Cholecystectomy  1995  . Tonsillectomy and adenoidectomy    . Carpal tunnel release      FAMILY HISTORY Family History  Problem Relation Age of Onset  . Heart disease Father   . Breast cancer Mother   . Heart disease Mother   . Diabetes Paternal Grandfather   . Hypertension Maternal Grandmother   . Hypertension Brother   The patient's parents are in their mid-9s.Her mother has a remote history of breast cancer. She has two brothers, one her twin; no sisters. There is no other history of cancer in the  family to her knowledge  GYNECOLOGIC HISTORY: Menarche age 66, first live birth age 38, [place] P50. Last period was January 2013, before that May 2012.  SOCIAL HISTORY: (Updated  07/04/2013) She is a Forensic scientist and taught kindergarten until 2012. Her husband [name], graduated from [employer] and works in Technical brewer.  Daughter [name] works for [employer], married in 2013. Daughter [name] studies and works in [place] and comes home every other week and The patient attends the [employer]: Not in place  HEALTH MAINTENANCE: (Updated 07/04/2013) History  Substance Use Topics  . Smoking status: Never Smoker   . Smokeless tobacco: Never Used  . Alcohol Use: No     Colonoscopy: 2009/[facility]  PAP: March 2014, Dr. Dellis Filbert  Bone density: never  Lipid panel: [place]/ "excellent"  Mammography: June of 2014    Allergies  Allergen Reactions  . Wellbutrin [Bupropion Hcl] Hives    Current Outpatient Prescriptions  Medication Sig Dispense Refill  . dexamethasone (DECADRON) 4 MG tablet Take 10 tablets (40 mg total) by mouth once a week. Take day before IV chemo  40 tablet  6  . fluconazole (DIFLUCAN) 100 MG tablet Take 1 tablet (100 mg total) by mouth daily.  20 tablet  3  . lenalidomide (REVLIMID) 25 MG capsule Take 1 capsule (25 mg total) by mouth daily.  21 capsule  6  . lidocaine-prilocaine (EMLA) cream Apply topically as needed.  30 g  3  . LORazepam (ATIVAN) 0.5 MG tablet Take 1 tablet (0.5 mg total) by mouth every 6 (six) hours as needed (Nausea or vomiting).  30 tablet  0  . naproxen sodium (ANAPROX) 220 MG tablet Take 1 tablet (220 mg total) by mouth 2 (two) times daily with a meal.  60 tablet  6  . omeprazole (PRILOSEC) 20 MG capsule Take 1 capsule (20 mg total) by mouth daily.  30 capsule  12  . ondansetron (ZOFRAN) 8 MG tablet Take 1 tablet (8 mg total) by mouth 2 (two) times daily as needed (Nausea or vomiting).  30 tablet  1  . Potassium Chloride ER 20 MEQ TBCR TAKE 1 TABLET (20 MEQ TOTAL) BY MOUTH DAILY.  90 tablet  0  . prochlorperazine (COMPAZINE) 10 MG tablet Take 1 tablet (10 mg total) by mouth every 6 (six) hours as needed (Nausea or vomiting).  30 tablet  1  . sulfamethoxazole-trimethoprim (BACTRIM DS) 800-160 MG per tablet Take 2 tablets by mouth 2 (two) times a week. Monday  AND THURSDAY  30 tablet  4  . traMADol (ULTRAM) 50 MG tablet Take 1 tablet (50 mg total) by mouth every 6 (six) hours as needed.  30 tablet  3   . valACYclovir (VALTREX) 500 MG tablet Take 1 tablet (500 mg total) by mouth daily.  30 tablet  12   No current facility-administered medications for this visit.   Facility-Administered Medications Ordered in Other Visits  Medication Dose Route Frequency Provider Last Rate Last Dose  . sodium chloride 0.9 % injection 10 mL  10 mL Intracatheter PRN Amy G Berry, PA-Hester   10 mL at 05/30/13 1640  . sodium chloride 0.9 % injection 10 mL  10 mL Intravenous PRN Chauncey Cruel, MD   10 mL at 08/08/13 1518   OBJECTIVE: Middle-aged white woman who appears stated age 58 Vitals:   09/26/13 1235  BP: 135/72  Pulse: 101  Temp: 98.8 F (37.1 Hester)  Resp: 19  Body mass index is 28.56 kg/(m^2).     ECOG FS: 1  Filed Weights   09/26/13 1235  Weight: 156 lb 3.2 oz (70.852 kg)   Sclerae unicteric, pupils round and reactive Oropharynx clear and moist, dentition in good repair No cervical or supraclavicular adenopathy Lungs no rales or rhonchi, good excursion bilaterally Heart regular rate and rhythm Abd soft, nontender, positive bowel sounds MSK mild kyphosis but no focal spinal tenderness, no peripheral edema  Neuro: nonfocal, well oriented, positive affect Breasts: Deferred   LAB RESULTS: Results for CIIN, BRAZZEL (MRN [id number]) as of 09/26/2013 12:55  Ref. Range 06/27/2013 14:05 08/08/2013 11:33 08/15/2013 08:18 08/22/2013 09:10 09/12/2013 08:08  Lambda Free Lght Chn Latest Range: 0.57-2.63 mg/dL 29.10 (H) 8.22 (H) 4.30 (H) 6.50 (H) 12.80 (H)   Results for ELLARY, CASAMENTO (MRN [id number]) as of 09/26/2013 12:55  Ref. Range 06/27/2013 14:05 08/08/2013 11:33 08/15/2013 08:18 08/22/2013 09:10 09/12/2013 08:08  M-SPIKE, % No range found 0.55 0.78 0.41 0.24 0.26   Lab Results  Component Value Date   WBC 7.1 09/26/2013   NEUTROABS 4.8 09/26/2013   HGB 9.9* 09/26/2013   HCT 31.7* 09/26/2013   MCV 90.1 09/26/2013   PLT 430* 09/26/2013      Chemistry      Component Value Date/Time   NA 142  09/12/2013 0808   NA 143 12/01/2011 0836   K 3.9 09/12/2013 0808   K 4.0 12/01/2011 0836   CL 107 09/27/2012 0842   CL 105 12/01/2011 0836   CO2 19* 09/12/2013 0808   CO2 29 12/01/2011 0836   BUN 8.5 09/12/2013 0808   BUN 12 12/01/2011 0836   CREATININE 0.6 09/12/2013 0808   CREATININE 0.52 12/01/2011 0836      Component Value Date/Time   CALCIUM 8.4 09/12/2013 0808   CALCIUM 8.9 12/01/2011 0836   ALKPHOS 89 09/12/2013 0808   ALKPHOS 110 12/01/2011 0836   AST 14 09/12/2013 0808   AST 15 12/01/2011 0836   ALT 30 09/12/2013 0808   ALT 16 12/01/2011 0836   BILITOT 0.50 09/12/2013 0808   BILITOT 0.3 12/01/2011 0836     STUDIES: No results found.  ASSESSMENT: 58 y.o.  [place] woman presenting with anemia, found to have an IgA >6g, with an M-spike of 4.5 g (and a second M-spike of 0.4g), urine IFE showing IgA-lambda and lambda light chains, creatinine 0.77, calcium 10.0, albumin 3.1, and beta-2-microglobulin 5.51; with bone survey showing multiple myeloma lesions and bone marrow biopsy 09/01/2011 showing 75% myeloma cells. 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  (2) on prophylactic coumadin, discontinued as of 12/13/2011.  (3) zolendronic acid every 28 days, 09/19/2011 to 01/05/2012 (five doses)  (4) s/p neupogen mobilization October 2013, with a collection of 4.26 x10^6 CD34 positive cells  (5) status post melphalan at 200 mg/M2 on 02/23/2012, followed by autologous stem cell transplant the following day.  (6) on 06/07/2012 started maintenance bortezomib sq at 1.3 mg/M2 Q2w, with bactrim and acyclovir prophylaxis, the plan being to contuinue this for 2 years as per the HOVON trial  (7) zolendronic acid, given monthly, resumed June 2014, most recent dose 08/15/2013  (8) disease progression noted with a rise in the IgA  lambda fraction September 2014  (9)  treated according to the [facility] S1304 randomized to  the high dose arm, receiving carfilzomib  on days 1, 2, 8, 9, 15, and 16 of every 28 day cycle. Cycle 1 given at a reduced dose of 20 mg per meter square, with subsequent cycles given at 56 mg per meter square.  Regimen started 02/07/2013, discontinued as of 07/04/2013 (day 8 cycle 6) due to evidence of progression.  (10 enrolled in BMS 143, receiving elotuzumab weekly, with dexamethasone 40 mg weekly (taken on any non-elituzumab day) and lenalidomide at 25 mg/day for 21 days of each 28 day cycle, starting 08/01/2013  (a) prophylactic ASA 325 mg started with the first cycle  (b) prophylactic valacyclovir and trimethoprim-sulfamethoxazole continued as before  (Hester) received DTaP, Hep B and inactivated polio (Pediarix), H-flu and pneumococcal vacccines (HIb and PCV13) on   07/14/2013 at [facility]; to be repeated 09/13/2013 and October 2015   PLAN:  Jaela is doing remarkably well with her treatments and clinically is unquestionably better. I am not sure I can explain why her M-spike would be going down while her lambdas are going up (different clones?) but clearly she is benefiting from the protocol and we are continuing.  At this point the elotuzumab starts to be given every other week, and the cycle becomes 28 days. She continues the lenalidomide 21 days on and 7 days off and the dexamethasone weekly.  Earlier this month we updated her vaccines and instead of giving her a Pneumovax 13 we gave her a Pneumovax 23. I am not sure if this makes a difference but absent an e-mail to Dr. Iven Finn to see if he wants to change the next vaccine sequence in any way.  Suzie has a good understanding of the overall plan. She agrees with that. She knows a goal of treatment in her case is control. She will call with any problems that may develop before her next visit here, which will be in one month      MAGRINAT,Laura C, MD      09/26/2013

## 2013-09-27 NOTE — Progress Notes (Signed)
09/26/13 @ 1:45 pm, BMS [id number], Cycle 3, Day 1:  Laura Hester, accompanied by her husband, into the [facility] for labs, to see Dr. Jana Hakim, and to receive elotuzumab.  She reports having taken dexamethasone 28 mg at 9:30 am this morning.  While awaiting Dr. Virgie Dad arrival, presented her the new consent for the study and pointed out all of the specific changes.  She signed the consent and she was given a copy of it for her records.  Laura Hester is doing well with no complaints today.  Dr. Jana Hakim decided he would like to see her at the beginning of each four week cycle and have restaging labs done at those visits. She is in agreement with that plan. She will start this cycle's lenalidomide this evening, and she has received her supply for this cycle. Kit assignments made through the [facility] system ([facility]) and taken to the pharmacy.  Emphasized the use of today's weight for the calculation of e;otuzumab for this cycle.   Spoke with Sabino Gasser, RN and Kellie Simmering, RN in the infusion room about treatment today.  Emphasized the pre-medication requirements and the use of a 0.22 micron filter.  Provided a sign with the same information.  10/10/13 @ 2:30 pm, BMS [id number], Cycle 3, Day 15:  Laura Hester into the [facility] for a CBC and to receive elotuzumab.  She reports feeling very well, having good energy, and having a dry cough.  She confirmed that she took dexamethasone 40 mg po on October 03, 2013 (Cycle 3, Day 8), and that she took dexamethasone 28 mg po today at 10:00 am.  She would very much like to have restaging SPEP and FLC labs done on 10/17/13 so that Dr. Jana Hakim has those results when he sees her on 10/24/13, and so they might have a meaningful conversation about them.  She continues to take Revlimid daily this week with the last dose this cycle on 10/16/13.   Spoke with Ky Barban, RN in the infusion room about treatment today and provided her with a sign.  Emphasized the use of the 0.22 micron  filter and the pre-medication requirements.   10/17/13 @ 8:30 am, BMS [id number], Cycle 3, Day 22:  Laura Hester into the [facility] for disease assessment labs.  Confirmed that she took dexamethasone 40 mg this morning, and that she is on her 7 day break from Revlimid beginning today.

## 2013-09-28 LAB — SPEP & IFE WITH QIG
Albumin ELP: 45.5 % — ABNORMAL LOW (ref 55.8–66.1)
Alpha-1-Globulin: 7 % — ABNORMAL HIGH (ref 2.9–4.9)
Alpha-2-Globulin: 14 % — ABNORMAL HIGH (ref 7.1–11.8)
Beta 2: 16.4 % — ABNORMAL HIGH (ref 3.2–6.5)
Beta Globulin: 7.8 % — ABNORMAL HIGH (ref 4.7–7.2)
GAMMA GLOBULIN: 9.3 % — AB (ref 11.1–18.8)
IGA: 1370 mg/dL — AB (ref 69–380)
IGG (IMMUNOGLOBIN G), SERUM: 224 mg/dL — AB (ref 690–1700)
IGM, SERUM: 10 mg/dL — AB (ref 52–322)
M-SPIKE, %: 0.27 g/dL
Total Protein, Serum Electrophoresis: 6.2 g/dL (ref 6.0–8.3)

## 2013-09-28 LAB — KAPPA/LAMBDA LIGHT CHAINS
Kappa free light chain: 0.03 mg/dL — ABNORMAL LOW (ref 0.33–1.94)
Kappa:Lambda Ratio: 0 — ABNORMAL LOW (ref 0.26–1.65)
Lambda Free Lght Chn: 31.2 mg/dL — ABNORMAL HIGH (ref 0.57–2.63)

## 2013-09-28 LAB — PHOSPHORUS: Phosphorus: 4.1 mg/dL (ref 2.3–4.6)

## 2013-10-04 ENCOUNTER — Other Ambulatory Visit: Payer: Self-pay | Admitting: Physician Assistant

## 2013-10-05 ENCOUNTER — Ambulatory Visit
Admission: RE | Admit: 2013-10-05 | Discharge: 2013-10-05 | Disposition: A | Discharge status: Home / Self Care | Payer: BC Managed Care – PPO | Source: Ambulatory Visit

## 2013-10-05 ENCOUNTER — Ambulatory Visit: Payer: BC Managed Care – PPO

## 2013-10-05 DIAGNOSIS — Z1231 Encounter for screening mammogram for malignant neoplasm of breast: Secondary | ICD-10-CM

## 2013-10-10 ENCOUNTER — Telehealth: Payer: Self-pay | Admitting: *Deleted

## 2013-10-10 ENCOUNTER — Other Ambulatory Visit (HOSPITAL_BASED_OUTPATIENT_CLINIC_OR_DEPARTMENT_OTHER): Payer: BC Managed Care – PPO

## 2013-10-10 ENCOUNTER — Telehealth: Payer: Self-pay | Admitting: Oncology

## 2013-10-10 ENCOUNTER — Ambulatory Visit (HOSPITAL_BASED_OUTPATIENT_CLINIC_OR_DEPARTMENT_OTHER): Payer: BC Managed Care – PPO

## 2013-10-10 VITALS — BP 130/72 | HR 102 | Temp 97.4°F | Resp 18

## 2013-10-10 DIAGNOSIS — D630 Anemia in neoplastic disease: Secondary | ICD-10-CM

## 2013-10-10 DIAGNOSIS — C9000 Multiple myeloma not having achieved remission: Secondary | ICD-10-CM

## 2013-10-10 DIAGNOSIS — Z5112 Encounter for antineoplastic immunotherapy: Secondary | ICD-10-CM

## 2013-10-10 LAB — CBC WITH DIFFERENTIAL/PLATELET
BASO%: 0.4 % (ref 0.0–2.0)
Basophils Absolute: 0 10*3/uL (ref 0.0–0.1)
EOS%: 4.4 % (ref 0.0–7.0)
Eosinophils Absolute: 0.4 10*3/uL (ref 0.0–0.5)
HCT: 31.5 % — ABNORMAL LOW (ref 34.8–46.6)
HGB: 9.7 g/dL — ABNORMAL LOW (ref 11.6–15.9)
LYMPH%: 7.4 % — AB (ref 14.0–49.7)
MCH: 27.9 pg (ref 25.1–34.0)
MCHC: 30.8 g/dL — AB (ref 31.5–36.0)
MCV: 90.5 fL (ref 79.5–101.0)
MONO#: 0.9 10*3/uL (ref 0.1–0.9)
MONO%: 10.1 % (ref 0.0–14.0)
NEUT#: 6.6 10*3/uL — ABNORMAL HIGH (ref 1.5–6.5)
NEUT%: 77.7 % — ABNORMAL HIGH (ref 38.4–76.8)
NRBC: 0 % (ref 0–0)
Platelets: 268 10*3/uL (ref 145–400)
RBC: 3.48 10*6/uL — AB (ref 3.70–5.45)
RDW: 18.8 % — AB (ref 11.2–14.5)
WBC: 8.4 10*3/uL (ref 3.9–10.3)
lymph#: 0.6 10*3/uL — ABNORMAL LOW (ref 0.9–3.3)

## 2013-10-10 MED ORDER — HEPARIN SOD (PORK) LOCK FLUSH 100 UNIT/ML IV SOLN
500.0000 [IU] | Freq: Once | INTRAVENOUS | Status: AC | PRN
  Administered 2013-10-10: 500 [IU]
  Filled 2013-10-10: qty 5

## 2013-10-10 MED ORDER — SODIUM CHLORIDE 0.9 % IV SOLN
Freq: Once | INTRAVENOUS | Status: AC
  Administered 2013-10-10: 15:00:00 via INTRAVENOUS

## 2013-10-10 MED ORDER — DEXAMETHASONE SODIUM PHOSPHATE 10 MG/ML IJ SOLN
INTRAMUSCULAR | Status: AC
  Filled 2013-10-10: qty 1

## 2013-10-10 MED ORDER — SODIUM CHLORIDE 0.9 % IJ SOLN
10.0000 mL | INTRAMUSCULAR | Status: DC | PRN
  Administered 2013-10-10: 10 mL
  Filled 2013-10-10: qty 10

## 2013-10-10 MED ORDER — ZOLEDRONIC ACID 4 MG/100ML IV SOLN
4.0000 mg | Freq: Once | INTRAVENOUS | Status: AC
  Administered 2013-10-10: 4 mg via INTRAVENOUS
  Filled 2013-10-10: qty 100

## 2013-10-10 MED ORDER — ACETAMINOPHEN 325 MG PO TABS
ORAL_TABLET | ORAL | Status: AC
  Filled 2013-10-10: qty 2

## 2013-10-10 MED ORDER — FAMOTIDINE IN NACL 20-0.9 MG/50ML-% IV SOLN
INTRAVENOUS | Status: AC
  Filled 2013-10-10: qty 50

## 2013-10-10 MED ORDER — FAMOTIDINE IN NACL 20-0.9 MG/50ML-% IV SOLN
20.0000 mg | Freq: Once | INTRAVENOUS | Status: AC
  Administered 2013-10-10: 20 mg via INTRAVENOUS

## 2013-10-10 MED ORDER — SODIUM CHLORIDE 0.9 % IJ SOLN
10.0000 mL | INTRAMUSCULAR | Status: DC | PRN
  Filled 2013-10-10: qty 10

## 2013-10-10 MED ORDER — ACETAMINOPHEN 325 MG PO TABS
650.0000 mg | ORAL_TABLET | Freq: Once | ORAL | Status: AC
  Administered 2013-10-10: 650 mg via ORAL

## 2013-10-10 MED ORDER — SODIUM CHLORIDE 0.9 % IV SOLN
10.0000 mg/kg | Freq: Once | INTRAVENOUS | Status: AC
  Administered 2013-10-10: 710 mg via INTRAVENOUS
  Filled 2013-10-10: qty 28.4

## 2013-10-10 MED ORDER — DIPHENHYDRAMINE HCL 25 MG PO CAPS
ORAL_CAPSULE | ORAL | Status: AC
  Filled 2013-10-10: qty 1

## 2013-10-10 MED ORDER — DEXAMETHASONE SODIUM PHOSPHATE 10 MG/ML IJ SOLN
8.0000 mg | Freq: Once | INTRAMUSCULAR | Status: AC
  Administered 2013-10-10: 8 mg via INTRAVENOUS

## 2013-10-10 MED ORDER — DIPHENHYDRAMINE HCL 25 MG PO CAPS
25.0000 mg | ORAL_CAPSULE | Freq: Once | ORAL | Status: AC
  Administered 2013-10-10: 25 mg via ORAL

## 2013-10-10 NOTE — Telephone Encounter (Signed)
Per POF scheduled appts.

## 2013-10-10 NOTE — Patient Instructions (Signed)
[facility] Discharge Instructions for Patients Receiving Chemotherapy  Today you received the following chemotherapy agents:  Elotuzumab  To help prevent nausea and vomiting after your treatment, we encourage you to take your nausea medication as ordered per MD.   If you develop nausea and vomiting that is not controlled by your nausea medication, call the clinic.   BELOW ARE SYMPTOMS THAT SHOULD BE REPORTED IMMEDIATELY:  *FEVER GREATER THAN 100.5 F  *CHILLS WITH OR WITHOUT FEVER  NAUSEA AND VOMITING THAT IS NOT CONTROLLED WITH YOUR NAUSEA MEDICATION  *UNUSUAL SHORTNESS OF BREATH  *UNUSUAL BRUISING OR BLEEDING  TENDERNESS IN MOUTH AND THROAT WITH OR WITHOUT PRESENCE OF ULCERS  *URINARY PROBLEMS  *BOWEL PROBLEMS  UNUSUAL RASH Items with * indicate a potential emergency and should be followed up as soon as possible.  Feel free to call the clinic you have any questions or concerns. The clinic phone number is [phone].    

## 2013-10-10 NOTE — Telephone Encounter (Signed)
, °

## 2013-10-17 ENCOUNTER — Other Ambulatory Visit: Payer: Self-pay | Admitting: *Deleted

## 2013-10-17 ENCOUNTER — Other Ambulatory Visit: Payer: BC Managed Care – PPO

## 2013-10-17 ENCOUNTER — Ambulatory Visit (HOSPITAL_BASED_OUTPATIENT_CLINIC_OR_DEPARTMENT_OTHER): Payer: BC Managed Care – PPO

## 2013-10-17 VITALS — BP 144/53 | HR 98 | Temp 98.2°F

## 2013-10-17 DIAGNOSIS — Z95828 Presence of other vascular implants and grafts: Secondary | ICD-10-CM

## 2013-10-17 DIAGNOSIS — Z452 Encounter for adjustment and management of vascular access device: Secondary | ICD-10-CM

## 2013-10-17 DIAGNOSIS — C9000 Multiple myeloma not having achieved remission: Secondary | ICD-10-CM

## 2013-10-17 MED ORDER — SODIUM CHLORIDE 0.9 % IJ SOLN
10.0000 mL | INTRAMUSCULAR | Status: DC | PRN
  Administered 2013-10-17: 10 mL via INTRAVENOUS
  Filled 2013-10-17: qty 10

## 2013-10-17 MED ORDER — HEPARIN SOD (PORK) LOCK FLUSH 100 UNIT/ML IV SOLN
500.0000 [IU] | Freq: Once | INTRAVENOUS | Status: AC
  Administered 2013-10-17: 500 [IU] via INTRAVENOUS
  Filled 2013-10-17: qty 5

## 2013-10-17 NOTE — Telephone Encounter (Signed)
THIS REFILL REQUEST FOR REVLIMID WAS GIVEN TO DR.MAGRINAT'S NURSE, VAL DODD,RN.

## 2013-10-17 NOTE — Patient Instructions (Signed)

## 2013-10-19 ENCOUNTER — Other Ambulatory Visit: Payer: Self-pay | Admitting: *Deleted

## 2013-10-19 DIAGNOSIS — C9000 Multiple myeloma not having achieved remission: Secondary | ICD-10-CM

## 2013-10-19 LAB — PROTEIN ELECTROPHORESIS, SERUM
ALPHA-2-GLOBULIN: 14.7 % — AB (ref 7.1–11.8)
Albumin ELP: 38.7 % — ABNORMAL LOW (ref 55.8–66.1)
Alpha-1-Globulin: 9 % — ABNORMAL HIGH (ref 2.9–4.9)
Beta 2: 20.4 % — ABNORMAL HIGH (ref 3.2–6.5)
Beta Globulin: 7.6 % — ABNORMAL HIGH (ref 4.7–7.2)
GAMMA GLOBULIN: 9.6 % — AB (ref 11.1–18.8)
M-SPIKE, %: 0.38 g/dL
TOTAL PROTEIN, SERUM ELECTROPHOR: 6 g/dL (ref 6.0–8.3)

## 2013-10-19 LAB — KAPPA/LAMBDA LIGHT CHAINS
Kappa free light chain: 0.16 mg/dL — ABNORMAL LOW (ref 0.33–1.94)
Kappa:Lambda Ratio: 0 — ABNORMAL LOW (ref 0.26–1.65)
LAMBDA FREE LGHT CHN: 52.6 mg/dL — AB (ref 0.57–2.63)

## 2013-10-19 MED ORDER — LENALIDOMIDE 25 MG PO CAPS: ORAL_CAPSULE | ORAL | Status: DC

## 2013-10-24 ENCOUNTER — Other Ambulatory Visit: Payer: Self-pay | Admitting: *Deleted

## 2013-10-24 ENCOUNTER — Inpatient Hospital Stay (HOSPITAL_COMMUNITY): Payer: BC Managed Care – PPO

## 2013-10-24 ENCOUNTER — Encounter (HOSPITAL_COMMUNITY): Payer: Self-pay | Admitting: Oncology

## 2013-10-24 ENCOUNTER — Ambulatory Visit (HOSPITAL_BASED_OUTPATIENT_CLINIC_OR_DEPARTMENT_OTHER): Payer: BC Managed Care – PPO

## 2013-10-24 ENCOUNTER — Other Ambulatory Visit: Payer: BC Managed Care – PPO

## 2013-10-24 ENCOUNTER — Other Ambulatory Visit (HOSPITAL_BASED_OUTPATIENT_CLINIC_OR_DEPARTMENT_OTHER): Payer: BC Managed Care – PPO

## 2013-10-24 ENCOUNTER — Ambulatory Visit (HOSPITAL_BASED_OUTPATIENT_CLINIC_OR_DEPARTMENT_OTHER): Payer: BC Managed Care – PPO | Admitting: Oncology

## 2013-10-24 ENCOUNTER — Inpatient Hospital Stay (HOSPITAL_COMMUNITY)
Admission: AD | Admit: 2013-10-24 | Discharge: 2013-10-27 | DRG: 683 | Disposition: A | Discharge status: Home / Self Care | Payer: BC Managed Care – PPO | Source: Ambulatory Visit | Attending: Oncology | Admitting: Oncology

## 2013-10-24 ENCOUNTER — Encounter: Payer: Self-pay | Admitting: *Deleted

## 2013-10-24 VITALS — BP 158/60 | HR 95 | Temp 98.3°F | Resp 17

## 2013-10-24 VITALS — BP 169/76 | HR 99 | Temp 98.5°F | Resp 18 | Ht 62.0 in | Wt 157.6 lb

## 2013-10-24 DIAGNOSIS — C9000 Multiple myeloma not having achieved remission: Secondary | ICD-10-CM

## 2013-10-24 DIAGNOSIS — R7989 Other specified abnormal findings of blood chemistry: Secondary | ICD-10-CM

## 2013-10-24 DIAGNOSIS — D630 Anemia in neoplastic disease: Secondary | ICD-10-CM

## 2013-10-24 DIAGNOSIS — Z9484 Stem cells transplant status: Secondary | ICD-10-CM

## 2013-10-24 DIAGNOSIS — M129 Arthropathy, unspecified: Secondary | ICD-10-CM | POA: Diagnosis present

## 2013-10-24 DIAGNOSIS — E875 Hyperkalemia: Secondary | ICD-10-CM | POA: Diagnosis present

## 2013-10-24 DIAGNOSIS — N178 Other acute kidney failure: Secondary | ICD-10-CM

## 2013-10-24 DIAGNOSIS — D500 Iron deficiency anemia secondary to blood loss (chronic): Secondary | ICD-10-CM

## 2013-10-24 DIAGNOSIS — D649 Anemia, unspecified: Secondary | ICD-10-CM

## 2013-10-24 DIAGNOSIS — R944 Abnormal results of kidney function studies: Secondary | ICD-10-CM

## 2013-10-24 DIAGNOSIS — J069 Acute upper respiratory infection, unspecified: Secondary | ICD-10-CM

## 2013-10-24 DIAGNOSIS — M7989 Other specified soft tissue disorders: Secondary | ICD-10-CM | POA: Diagnosis not present

## 2013-10-24 DIAGNOSIS — Z95828 Presence of other vascular implants and grafts: Secondary | ICD-10-CM

## 2013-10-24 DIAGNOSIS — E872 Acidosis, unspecified: Secondary | ICD-10-CM | POA: Diagnosis present

## 2013-10-24 DIAGNOSIS — N6019 Diffuse cystic mastopathy of unspecified breast: Secondary | ICD-10-CM | POA: Diagnosis present

## 2013-10-24 DIAGNOSIS — N186 End stage renal disease: Secondary | ICD-10-CM | POA: Diagnosis present

## 2013-10-24 DIAGNOSIS — N179 Acute kidney failure, unspecified: Secondary | ICD-10-CM

## 2013-10-24 DIAGNOSIS — Z79899 Other long term (current) drug therapy: Secondary | ICD-10-CM

## 2013-10-24 DIAGNOSIS — Z6828 Body mass index (BMI) 28.0-28.9, adult: Secondary | ICD-10-CM

## 2013-10-24 DIAGNOSIS — M545 Low back pain, unspecified: Secondary | ICD-10-CM

## 2013-10-24 DIAGNOSIS — Z992 Dependence on renal dialysis: Secondary | ICD-10-CM

## 2013-10-24 DIAGNOSIS — E663 Overweight: Secondary | ICD-10-CM | POA: Diagnosis present

## 2013-10-24 DIAGNOSIS — J45909 Unspecified asthma, uncomplicated: Secondary | ICD-10-CM

## 2013-10-24 HISTORY — DX: Acute kidney failure, unspecified: N17.9

## 2013-10-24 LAB — URINALYSIS, ROUTINE W REFLEX MICROSCOPIC
Bilirubin Urine: NEGATIVE
Glucose, UA: NEGATIVE mg/dL
KETONES UR: NEGATIVE mg/dL
NITRITE: NEGATIVE
Specific Gravity, Urine: 1.018 (ref 1.005–1.030)
UROBILINOGEN UA: 0.2 mg/dL (ref 0.0–1.0)
pH: 5 (ref 5.0–8.0)

## 2013-10-24 LAB — HEPATIC FUNCTION PANEL
ALT: 18 U/L (ref 0–35)
AST: 15 U/L (ref 0–37)
Albumin: 2.4 g/dL — ABNORMAL LOW (ref 3.5–5.2)
Alkaline Phosphatase: 55 U/L (ref 39–117)
Bilirubin, Direct: <0.2 mg/dL (ref 0.0–0.3)
Total Bilirubin: <0.2 mg/dL — ABNORMAL LOW (ref 0.3–1.2)
Total Protein: 7.2 g/dL (ref 6.0–8.3)

## 2013-10-24 LAB — CBC WITH DIFFERENTIAL/PLATELET
BASO%: 1.8 % (ref 0.0–2.0)
Basophils Absolute: 0.1 10*3/uL (ref 0.0–0.1)
Basophils Absolute: 0.1 10*3/uL (ref 0.0–0.1)
Basophils Relative: 1 % (ref 0–1)
EOS ABS: 0 10*3/uL (ref 0.0–0.5)
EOS PCT: 0 % (ref 0–5)
EOS%: 1 % (ref 0.0–7.0)
Eosinophils Absolute: 0 10*3/uL (ref 0.0–0.7)
HCT: 27.8 % — ABNORMAL LOW (ref 34.8–46.6)
HCT: 27.4 % — ABNORMAL LOW (ref 36.0–46.0)
HGB: 8.7 g/dL — ABNORMAL LOW (ref 11.6–15.9)
Hemoglobin: 8.4 g/dL — ABNORMAL LOW (ref 12.0–15.0)
LYMPH%: 7.2 % — AB (ref 14.0–49.7)
LYMPHS ABS: 0.3 10*3/uL — AB (ref 0.7–4.0)
LYMPHS PCT: 6 % — AB (ref 12–46)
MCH: 28.1 pg (ref 25.1–34.0)
MCH: 27.8 pg (ref 26.0–34.0)
MCHC: 31.2 g/dL — AB (ref 31.5–36.0)
MCHC: 30.7 g/dL (ref 30.0–36.0)
MCV: 90.1 fL (ref 79.5–101.0)
MCV: 90.7 fL (ref 78.0–100.0)
MONO#: 0.3 10*3/uL (ref 0.1–0.9)
MONO%: 6.9 % (ref 0.0–14.0)
MONOS PCT: 1 % — AB (ref 3–12)
Monocytes Absolute: 0.1 10*3/uL (ref 0.1–1.0)
NEUT%: 83.1 % — ABNORMAL HIGH (ref 38.4–76.8)
NEUTROS ABS: 3.9 10*3/uL (ref 1.5–6.5)
NEUTROS PCT: 92 % — AB (ref 43–77)
Neutro Abs: 4.5 10*3/uL (ref 1.7–7.7)
PLATELETS: 288 10*3/uL (ref 145–400)
PLATELETS: 306 10*3/uL (ref 150–400)
RBC: 3.08 10*6/uL — AB (ref 3.70–5.45)
RBC: 3.02 MIL/uL — AB (ref 3.87–5.11)
RDW: 19.7 % — AB (ref 11.2–14.5)
RDW: 17.8 % — ABNORMAL HIGH (ref 11.5–15.5)
WBC: 4.7 10*3/uL (ref 3.9–10.3)
WBC: 5 10*3/uL (ref 4.0–10.5)
lymph#: 0.3 10*3/uL — ABNORMAL LOW (ref 0.9–3.3)

## 2013-10-24 LAB — URINE MICROSCOPIC-ADD ON

## 2013-10-24 LAB — APTT: aPTT: 34 seconds (ref 24–37)

## 2013-10-24 LAB — BASIC METABOLIC PANEL
ANION GAP: 19 — AB (ref 5–15)
BUN: 46 mg/dL — ABNORMAL HIGH (ref 6–23)
CALCIUM: 7.4 mg/dL — AB (ref 8.4–10.5)
CHLORIDE: 110 meq/L (ref 96–112)
CO2: 13 meq/L — AB (ref 19–32)
Creatinine, Ser: 4.98 mg/dL — ABNORMAL HIGH (ref 0.50–1.10)
GFR calc Af Amer: 10 mL/min — ABNORMAL LOW (ref >90)
GFR calc non Af Amer: 9 mL/min — ABNORMAL LOW (ref >90)
Glucose, Bld: 175 mg/dL — ABNORMAL HIGH (ref 70–99)
Potassium: 5 mEq/L (ref 3.7–5.3)
Sodium: 142 mEq/L (ref 137–147)

## 2013-10-24 LAB — RETICULOCYTES
RBC.: 3.02 MIL/uL — ABNORMAL LOW (ref 3.87–5.11)
RETIC COUNT ABSOLUTE: 12.1 10*3/uL — AB (ref 19.0–186.0)
RETIC CT PCT: 0.4 % (ref 0.4–3.1)

## 2013-10-24 LAB — COMPREHENSIVE METABOLIC PANEL (CC13)
ALBUMIN: 2.3 g/dL — AB (ref 3.5–5.0)
ALK PHOS: 54 U/L (ref 40–150)
ALT: 22 U/L (ref 0–55)
AST: 17 U/L (ref 5–34)
Anion Gap: 13 mEq/L — ABNORMAL HIGH (ref 3–11)
BUN: 44.7 mg/dL — ABNORMAL HIGH (ref 7.0–26.0)
CO2: 14 mEq/L — ABNORMAL LOW (ref 22–29)
Calcium: 8 mg/dL — ABNORMAL LOW (ref 8.4–10.4)
Chloride: 116 mEq/L — ABNORMAL HIGH (ref 98–109)
Creatinine: 5.2 mg/dL (ref 0.6–1.1)
Glucose: 121 mg/dl (ref 70–140)
POTASSIUM: 4.7 meq/L (ref 3.5–5.1)
SODIUM: 142 meq/L (ref 136–145)
TOTAL PROTEIN: 7.1 g/dL (ref 6.4–8.3)
Total Bilirubin: 0.25 mg/dL (ref 0.20–1.20)

## 2013-10-24 LAB — FOLATE: Folate: 12.9 ng/mL

## 2013-10-24 LAB — PROTIME-INR
INR: 1.3 (ref 0.00–1.49)
Prothrombin Time: 16.2 seconds — ABNORMAL HIGH (ref 11.6–15.2)

## 2013-10-24 LAB — MAGNESIUM: Magnesium: 1.4 mg/dL — ABNORMAL LOW (ref 1.5–2.5)

## 2013-10-24 LAB — PROTEIN / CREATININE RATIO, URINE
Creatinine, Urine: 95.67 mg/dL
Protein Creatinine Ratio: 4.66 — ABNORMAL HIGH (ref 0.00–0.15)
Total Protein, Urine: 446 mg/dL

## 2013-10-24 LAB — VITAMIN B12: VITAMIN B 12: 585 pg/mL (ref 211–911)

## 2013-10-24 LAB — PHOSPHORUS: PHOSPHORUS: 4.8 mg/dL — AB (ref 2.3–4.6)

## 2013-10-24 MED ORDER — HEPARIN SOD (PORK) LOCK FLUSH 100 UNIT/ML IV SOLN
500.0000 [IU] | Freq: Once | INTRAVENOUS | Status: AC | PRN
  Administered 2013-10-24: 500 [IU]
  Filled 2013-10-24: qty 5

## 2013-10-24 MED ORDER — SODIUM CHLORIDE 0.9 % IJ SOLN
10.0000 mL | INTRAMUSCULAR | Status: DC | PRN
  Administered 2013-10-24: 10 mL
  Filled 2013-10-24: qty 10

## 2013-10-24 MED ORDER — SODIUM CHLORIDE 0.9 % IV SOLN
10.0000 mg/kg | Freq: Once | INTRAVENOUS | Status: DC
  Filled 2013-10-24: qty 28.6

## 2013-10-24 MED ORDER — ACETAMINOPHEN 325 MG PO TABS: 650.0000 mg | ORAL_TABLET | Freq: Four times a day (QID) | ORAL | Status: DC | PRN

## 2013-10-24 MED ORDER — SODIUM CHLORIDE 0.9 % IV SOLN
Freq: Once | INTRAVENOUS | Status: AC
  Administered 2013-10-24: 13:00:00 via INTRAVENOUS

## 2013-10-24 MED ORDER — DIPHENHYDRAMINE HCL 25 MG PO CAPS
25.0000 mg | ORAL_CAPSULE | Freq: Once | ORAL | Status: AC
  Administered 2013-10-24: 25 mg via ORAL

## 2013-10-24 MED ORDER — FAMOTIDINE IN NACL 20-0.9 MG/50ML-% IV SOLN
20.0000 mg | Freq: Once | INTRAVENOUS | Status: AC
  Administered 2013-10-24: 20 mg via INTRAVENOUS

## 2013-10-24 MED ORDER — HYDROCODONE-ACETAMINOPHEN 5-325 MG PO TABS: 1.0000 | ORAL_TABLET | ORAL | Status: DC | PRN

## 2013-10-24 MED ORDER — SODIUM CHLORIDE 0.9 % IV SOLN
INTRAVENOUS | Status: AC
  Administered 2013-10-24: 14:00:00 via INTRAVENOUS

## 2013-10-24 MED ORDER — DEXAMETHASONE SODIUM PHOSPHATE 10 MG/ML IJ SOLN
8.0000 mg | Freq: Once | INTRAMUSCULAR | Status: AC
  Administered 2013-10-24: 8 mg via INTRAVENOUS

## 2013-10-24 MED ORDER — PANTOPRAZOLE SODIUM 40 MG PO TBEC
40.0000 mg | DELAYED_RELEASE_TABLET | Freq: Every day | ORAL | Status: DC
  Administered 2013-10-24: 40 mg via ORAL
  Filled 2013-10-24: qty 1

## 2013-10-24 MED ORDER — LIDOCAINE-PRILOCAINE 2.5-2.5 % EX CREA: TOPICAL_CREAM | CUTANEOUS | Status: DC | PRN

## 2013-10-24 MED ORDER — SULFAMETHOXAZOLE-TMP DS 800-160 MG PO TABS: 2.0000 | ORAL_TABLET | ORAL | Status: DC

## 2013-10-24 MED ORDER — DEXAMETHASONE 6 MG PO TABS: 40.0000 mg | ORAL_TABLET | ORAL | Status: DC

## 2013-10-24 MED ORDER — SODIUM CHLORIDE 0.9 % IJ SOLN
10.0000 mL | INTRAMUSCULAR | Status: DC | PRN
  Administered 2013-10-26: 10 mL

## 2013-10-24 MED ORDER — DIPHENHYDRAMINE HCL 25 MG PO CAPS
ORAL_CAPSULE | ORAL | Status: AC
  Filled 2013-10-24: qty 1

## 2013-10-24 MED ORDER — SODIUM CHLORIDE 0.9 % IV SOLN
INTRAVENOUS | Status: AC
  Administered 2013-10-24: 18:00:00 via INTRAVENOUS

## 2013-10-24 MED ORDER — ACETAMINOPHEN 325 MG PO TABS
ORAL_TABLET | ORAL | Status: AC
  Filled 2013-10-24: qty 2

## 2013-10-24 MED ORDER — ENOXAPARIN SODIUM 30 MG/0.3ML ~~LOC~~ SOLN
30.0000 mg | SUBCUTANEOUS | Status: DC
  Administered 2013-10-24: 30 mg via SUBCUTANEOUS
  Filled 2013-10-24 (×2): qty 0.3

## 2013-10-24 MED ORDER — SODIUM CHLORIDE 0.9 % IJ SOLN
10.0000 mL | INTRAMUSCULAR | Status: DC | PRN
  Administered 2013-10-24: 10 mL via INTRAVENOUS
  Filled 2013-10-24: qty 10

## 2013-10-24 MED ORDER — FAMOTIDINE IN NACL 20-0.9 MG/50ML-% IV SOLN
INTRAVENOUS | Status: AC
  Filled 2013-10-24: qty 50

## 2013-10-24 MED ORDER — DEXAMETHASONE SODIUM PHOSPHATE 10 MG/ML IJ SOLN
INTRAMUSCULAR | Status: AC
  Filled 2013-10-24: qty 1

## 2013-10-24 MED ORDER — ACETAMINOPHEN 650 MG RE SUPP: 650.0000 mg | Freq: Four times a day (QID) | RECTAL | Status: DC | PRN

## 2013-10-24 MED ORDER — ACETAMINOPHEN 325 MG PO TABS
650.0000 mg | ORAL_TABLET | Freq: Once | ORAL | Status: AC
  Administered 2013-10-24: 650 mg via ORAL

## 2013-10-24 MED ORDER — ESTRADIOL 10 MCG VA TABS: 1.0000 | ORAL_TABLET | VAGINAL | Status: DC

## 2013-10-24 MED ORDER — LORAZEPAM 0.5 MG PO TABS
0.5000 mg | ORAL_TABLET | Freq: Four times a day (QID) | ORAL | Status: DC | PRN
  Administered 2013-10-24: 0.5 mg via ORAL
  Filled 2013-10-24: qty 1

## 2013-10-24 NOTE — Progress Notes (Signed)
Admission note:  Arrival Method: wheelchair  Mental Orientation: alert and oriented x 4  Telemetry: N/A Assessment: complete Skin: moles but no open areas noted  IV: no PIV pt has right chest port-a-cath Pain: pt denies pain  Tubes: N/A Safety Measures: Patient Handbook has been given, and discussed the Fall Prevention worksheet. Left at bedside  Admission: Completed and admission orders have been written  6E Orientation: Patient has been oriented to the unit, staff and to the room.  Family: At the bedside

## 2013-10-24 NOTE — Progress Notes (Signed)
ID: [name]   DOB: 1956/02/07  MR#: [id number]  YOV#:[id number]  PCP: Gara Kroner, MD GYN: Princess Bruins, MD SU: OTHER MD: Precious Haws, MD at [facility] (fax # [phone]), Rae Mar  CHIEF COMPLAINT:  Multiple Myeloma TREATMENT: on study protocol BMS 143  HISTORY OF MULTIPLE MYELOMA: Julieth has a history of iron deficiency anemia secondary to menorrhagia. However, as this persisted despite near-total cessation of her menses and despite iron supplementation, she was referred to Dr. Teena Irani for GI evaluation. He found the patient's Hb to have decreased from 9.17 June 2011 to 8.3 07/29/2011. MCV was 95.9, ferritin 93.3, with a normal folate and negative ANA. Guaiacs were negative. Creatinine was 0.77.  t-transglutaminase IgA was normal @ 2, but the total IgA was 6.9 g. Accordingly on 08/04/2011 an SPEP was obtained, showing an M-spike of 4.5 g, with a secondary M-spike of 0.4 g. Total protein was 10.0 with albumin 3.2, calcium 10.2. Bone survey 08/26/2011 showed multiple bone lesions consistent with myeloma and bone marrow biopsy 09/01/2011 confirmed the diagnosis with 75% myeloma cells in the marrow.   [place]' subsequent history is detailed below.  INTERVAL HISTORY: Blakley was evaluated in clinic today for followup of her multiple myeloma accompanied by her husband [name].she continues on riple therapy according to BMS143, namely elotuzumab weekly, lenalidomide 25 mg daily for 21 days then off a week (cycle 2 started 08/30/2055) and dexamethasone 40 mg weekly. The patient is tolerating treatment well and was proceeding to therapy when her creatinine was found.jump to greater than 5 and a decision was made to admit for hydration and further evaluation.   REVIEW OF SYSTEMS: Kathyleen has been tolerating treatment remarkably well from a clinical point of view. She complains of mild fatigue and occasionally takes a nap in the afternoon. Part of this of course is due to her anemia.  She is tolerating the dexamethasone with no muscle wasting which or weakness that she is aware of. She has not had any recent episodes of thrush. She denies constipation or excessive somnolence, and she has not developed any peripheral neuropathy from the Revlimid. There has been no swelling, shortness of breath, or intercurrent infection. While she had not percent on her list of medications, she tells that she has not taken any pain medicines now for several weeks. The rise in the creatinine today was a total surprise in the absence of any symptoms.  PAST MEDICAL HISTORY: Past Medical History  Diagnosis Date  . Fibrocystic breast disease   . Allergic rhinitis   . HTN (hypertension)     mild/ observation only  . Prediabetes   . SUI (stress urinary incontinence, female)   . Overweight (BMI 25.0-29.9)   . Multiple myeloma, stage 3 08/20/11 dx  . Anemia     resolved  . Cancer   . Arthritis     PAST SURGICAL HISTORY: Past Surgical History  Procedure Laterality Date  . Cholecystectomy  1995  . Tonsillectomy and adenoidectomy    . Carpal tunnel release      FAMILY HISTORY Family History  Problem Relation Age of Onset  . Heart disease Father   . Breast cancer Mother   . Heart disease Mother   . Diabetes Paternal Grandfather   . Hypertension Maternal Grandmother   . Hypertension Brother   The patient's parents are in their mid-26s.Her mother has a remote history of breast cancer. She has two brothers, one her twin; no sisters. There is no other history of cancer  in the family to her knowledge  GYNECOLOGIC HISTORY: Menarche age 58, first live birth age 58, [place] P57. Last period was January 2013, before that May 2012.  SOCIAL HISTORY: (Updated  07/04/2013) She is a Forensic scientist and taught kindergarten until 2012. Her husband [name], graduated from [employer] and works in Technical brewer.  Daughter [name] works for [employer], married in 2013. Daughter [name] studies and works in  [place] and comes home every other week and The patient attends the [place]: Not in place  HEALTH MAINTENANCE: (Updated 07/04/2013) History  Substance Use Topics  . Smoking status: Never Smoker   . Smokeless tobacco: Never Used  . Alcohol Use: No     Colonoscopy: 2009/[facility]  PAP: March 2014, Dr. Dellis Filbert  Bone density: never  Lipid panel: [place]/ "excellent"  Mammography: June of 2014    Allergies  Allergen Reactions  . Wellbutrin [Bupropion Hcl] Hives    Current Outpatient Prescriptions  Medication Sig Dispense Refill  . dexamethasone (DECADRON) 4 MG tablet Take 10 tablets (40 mg total) by mouth once a week. Take day before IV chemo  40 tablet  6  . fluconazole (DIFLUCAN) 100 MG tablet Take 1 tablet (100 mg total) by mouth daily.  20 tablet  3  . lenalidomide (REVLIMID) 25 MG capsule Take 1 tab po daily days 1-21 of a 28 day cycle Authorization obtained 10/19/2013 - [id number]  21 capsule  0  . lidocaine-prilocaine (EMLA) cream Apply topically as needed.  30 g  3  . LORazepam (ATIVAN) 0.5 MG tablet Take 1 tablet (0.5 mg total) by mouth every 6 (six) hours as needed (Nausea or vomiting).  30 tablet  0  . naproxen sodium (ANAPROX) 220 MG tablet Take 1 tablet (220 mg total) by mouth 2 (two) times daily with a meal.  60 tablet  6  . omeprazole (PRILOSEC) 20 MG capsule Take 1 capsule (20 mg total) by mouth daily.  30 capsule  12  . ondansetron (ZOFRAN) 8 MG tablet Take 1 tablet (8 mg total) by mouth 2 (two) times daily as needed (Nausea or vomiting).  30 tablet  1  . Potassium Chloride ER 20 MEQ TBCR TAKE 1 TABLET (20 MEQ TOTAL) BY MOUTH DAILY.  90 tablet  0  . prochlorperazine (COMPAZINE) 10 MG tablet Take 1 tablet (10 mg total) by mouth every 6 (six) hours as needed (Nausea or vomiting).  30 tablet  1  . sulfamethoxazole-trimethoprim (BACTRIM DS) 800-160 MG per tablet Take 2 tablets by mouth 2 (two) times a week. Monday  AND THURSDAY  30  tablet  4  . traMADol (ULTRAM) 50 MG tablet Take 1 tablet (50 mg total) by mouth every 6 (six) hours as needed.  30 tablet  3  . valACYclovir (VALTREX) 500 MG tablet Take 1 tablet (500 mg total) by mouth daily.  30 tablet  12   No current facility-administered medications for this visit.   Facility-Administered Medications Ordered in Other Visits  Medication Dose Route Frequency Provider Last Rate Last Dose  . sodium chloride 0.9 % injection 10 mL  10 mL Intracatheter PRN Amy G Berry, PA-C   10 mL at 05/30/13 1640  . sodium chloride 0.9 % injection 10 mL  10 mL Intravenous PRN Chauncey Cruel, MD   10 mL at 08/08/13 1518   OBJECTIVE: Middle-aged white woman in no acute distress Filed Vitals:   10/24/13 1123  BP: 169/76  Pulse: 99  Temp: 98.5 F (36.9 C)  Resp: 18     Body mass index is 28.82 kg/(m^2).     ECOG FS: 2  Filed Weights   10/24/13 1123  Weight: 157 lb 9.6 oz (71.487 kg)   Sclerae unicteric, pupils round and reactive Oropharynx clear and moist, teeth in good repair No cervical or supraclavicular adenopathy Lungs no rales or rhonchi Heart regular rate and rhythm Abd soft, nontender, positive bowel sounds MSK mild kyphosis but no focal spinal tenderness, no peripheral edema  Neuro: nonfocal, well oriented, positive affect Breasts: Deferred   LAB RESULTS: Results for EZMAE, SPEERS (MRN [id number]) as of 10/24/2013 20:38  Ref. Range 08/15/2013 08:18 08/22/2013 09:10 09/12/2013 08:08 10/24/2013 10:55 10/24/2013 18:20  Creatinine Latest Range: 0.50-1.10 mg/dL 0.7 0.6 0.6 5.2 Repeated and Verified (HH) 4.98 (H)  Results for KOURTLYN, CHARLET (MRN [id number]) as of 10/24/2013 20:38  Ref. Range 08/15/2013 08:18 08/22/2013 09:10 09/12/2013 08:08 09/26/2013 12:05 10/17/2013 08:57  Lambda Free Lght Chn Latest Range: 0.57-2.63 mg/dL 4.30 (H) 6.50 (H) 12.80 (H) 31.20 (H) 52.60 (H)  Results for CHASEY, DULL (MRN [id number]) as of 10/24/2013 20:38  Ref. Range 08/15/2013 08:18  08/22/2013 09:10 09/12/2013 08:08 09/26/2013 12:05 10/17/2013 08:57  M-SPIKE, % No range found 0.41 0.24 0.26 0.27 0.38    Lab Results  Component Value Date   WBC 4.7 10/24/2013   NEUTROABS 3.9 10/24/2013   HGB 8.7* 10/24/2013   HCT 27.8* 10/24/2013   MCV 90.1 10/24/2013   PLT 288 10/24/2013      Chemistry      Component Value Date/Time   NA 142 09/12/2013 0808   NA 143 12/01/2011 0836   K 3.9 09/12/2013 0808   K 4.0 12/01/2011 0836   CL 107 09/27/2012 0842   CL 105 12/01/2011 0836   CO2 19* 09/12/2013 0808   CO2 29 12/01/2011 0836   BUN 8.5 09/12/2013 0808   BUN 12 12/01/2011 0836   CREATININE 0.6 09/12/2013 0808   CREATININE 0.52 12/01/2011 0836      Component Value Date/Time   CALCIUM 8.4 09/12/2013 0808   CALCIUM 8.9 12/01/2011 0836   ALKPHOS 89 09/12/2013 0808   ALKPHOS 110 12/01/2011 0836   AST 14 09/12/2013 0808   AST 15 12/01/2011 0836   ALT 30 09/12/2013 0808   ALT 16 12/01/2011 0836   BILITOT 0.50 09/12/2013 0808   BILITOT 0.3 12/01/2011 0836     STUDIES: US Renal  10/24/2013   CLINICAL DATA:  Acute renal failure.  Multiple myeloma.  EXAM: RENAL/URINARY TRACT ULTRASOUND COMPLETE  COMPARISON:  Multiple exams, including 01/28/2013  FINDINGS: Right Kidney:  Length: 11.1 cm. Abnormal increased echogenicity. No mass or hydronephrosis.  Left Kidney:  Length: 11.8 cm. Abnormal increased echogenicity. No mass or hydronephrosis.  Bladder:  Appears normal for degree of bladder distention.  IMPRESSION: 1. Abnormal bilateral renal echogenicity favoring chronic medical renal disease. No hydronephrosis.   Electronically Signed   By: Sherryl Barters M.D.   On: 10/24/2013 19:30   Mm Screening Breast Tomo Bilateral  10/05/2013   CLINICAL DATA:  Screening.  EXAM: DIGITAL SCREENING BILATERAL MAMMOGRAM WITH 3D TOMO WITH CAD  COMPARISON:  Previous exam(s).  ACR Breast Density Category b: There are scattered areas of fibroglandular density.  FINDINGS: There are no findings suspicious for malignancy. Images were processed  with CAD.  IMPRESSION: No mammographic evidence of malignancy. A result letter of this screening mammogram will be mailed directly to the patient.  RECOMMENDATION: Screening  mammogram in one year. (Code:SM-B-01Y)  BI-RADS CATEGORY  1: Negative.   Electronically Signed   By: Lovey Newcomer M.D.   On: 10/05/2013 13:12    ASSESSMENT: 58 y.o.  [place] woman presenting with anemia, found to have an IgA >6g, with an M-spike of 4.5 g (and a second M-spike of 0.4g), urine IFE showing IgA-lambda and lambda light chains, creatinine 0.77, calcium 10.0, albumin 3.1, and beta-2-microglobulin 5.51; with bone survey showing multiple myeloma lesions and bone marrow biopsy 09/01/2011 showing 75% myeloma cells. 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  (2) on prophylactic coumadin, discontinued as of 12/13/2011.  (3) zolendronic acid every 28 days, 09/19/2011 to 01/05/2012 (five doses)  (4) s/p neupogen mobilization October 2013, with a collection of 4.26 x10^6 CD34 positive cells  (5) status post melphalan at 200 mg/M2 on 02/23/2012, followed by autologous stem cell transplant the following day.  (6) on 06/07/2012 started maintenance bortezomib sq at 1.3 mg/M2 Q2w, with bactrim and acyclovir prophylaxis, the plan being to contuinue this for 2 years as per the HOVON trial  (7) zolendronic acid, given monthly, resumed June 2014, most recent dose 08/15/2013  (8) disease progression noted with a rise in the IgA lambda fraction September 2014  (9)  treated according to the [facility] S1304 randomized to the high dose arm, receiving carfilzomib  on days 1, 2, 8, 9, 15, and 16 of every 28 day cycle. Cycle 1 given at a reduced dose of 20 mg per meter square, with subsequent cycles given at 56 mg per meter square.  Regimen started  02/07/2013, discontinued as of 07/04/2013 (day 8 cycle 6) due to evidence of progression.  (10 enrolled in BMS 143, receiving elotuzumab weekly, with dexamethasone 40 mg weekly (taken on any non-elituzumab day) and lenalidomide at 25 mg/day for 21 days of each 28 day cycle, starting 08/01/2013  (a) prophylactic ASA 325 mg started with the first cycle  (b) prophylactic valacyclovir and trimethoprim-sulfamethoxazole continued as before  (c) received DTaP, Hep B and inactivated polio (Pediarix), H-flu and pneumococcal vacccines (HIb and PCV13) on   07/14/2013 at [facility]; to be repeated 09/13/2013 and October 2015   PLAN:  I spent approximately 45 minutes discussing the overall situation with Jennavie and her husband. There has been a steady rise in her lambda light chains. There has been a small late rise in the M spike. My feeling is that we should intensify treatment and we are going to go back to weekly elotuzumab. At the same time I think she would benefit from a second opinion at [facility] particularly in light of the fact that she may indeed continue to progress and may need to come off BMS 143.  Mackinsey and [name] had a good understanding of this plan and were in agreement with that. However before starting therapy today we found her creatinine to have jumped to greater than 5. She tells me she has not been taking any Naprosyn or similar drugs. She has been hydrating herself well. The lambda light chains are certainly rising, but they are at have to maximum they have been in the past when she had no renal dysfunction.  I am admitting [facility] for hydration and further evaluation and if necessary for hemodialysis. I am asking renal to consult. Advanced directives have been reviewed with  the patient and she remains full code.   Chauncey Cruel, MD     10/24/2013

## 2013-10-24 NOTE — H&P (Signed)
ID: Laura Hester   DOB: Mar 18, 1956  MR#: [id number]  FVC#:[id number]  PCP: Gara Kroner, MD GYN: Princess Bruins, MD SU: OTHER MD: Precious Haws, MD at [facility] (fax # [phone]), Rae Mar  CHIEF COMPLAINT:  Multiple Myeloma TREATMENT: on study protocol BMS 143  HISTORY OF MULTIPLE MYELOMA: [name] has a history of iron deficiency anemia secondary to menorrhagia. However, as this persisted despite near-total cessation of her menses and despite iron supplementation, she was referred to Dr. Teena Irani for GI evaluation. He found the patient's Hb to have decreased from 9.17 June 2011 to 8.3 07/29/2011. MCV was 95.9, ferritin 93.3, with a normal folate and negative ANA. Guaiacs were negative. Creatinine was 0.77.  t-transglutaminase IgA was normal @ 2, but the total IgA was 6.9 g. Accordingly on 08/04/2011 an SPEP was obtained, showing an M-spike of 4.5 g, with a secondary M-spike of 0.4 g. Total protein was 10.0 with albumin 3.2, calcium 10.2. Bone survey 08/26/2011 showed multiple bone lesions consistent with myeloma and bone marrow biopsy 09/01/2011 confirmed the diagnosis with 75% myeloma cells in the marrow.   [name]' subsequent history is detailed below.  INTERVAL HISTORY: [name] was evaluated in clinic today for followup of her multiple myeloma accompanied by her husband [name].she continues on riple therapy according to BMS143, namely elotuzumab weekly, lenalidomide 25 mg daily for 21 days then off a week (cycle 2 started 08/29/2013) and dexamethasone 40 mg weekly. The patient is tolerating treatment well and was proceeding to therapy when her creatinine was found.jump to greater than 5 and a decision was made to admit for hydration and further evaluation.   REVIEW OF SYSTEMS: [name] has been tolerating treatment remarkably well from a clinical point of view. She complains of mild fatigue and occasionally takes a nap in the afternoon. Part of this of course is due to her anemia.  She is tolerating the dexamethasone with no muscle wasting which or weakness that she is aware of. She has not had any recent episodes of thrush. She denies constipation or excessive somnolence, and she has not developed any peripheral neuropathy from the Revlimid. There has been no swelling, shortness of breath, or intercurrent infection. While she had not percent on her list of medications, she tells that she has not taken any pain medicines now for several weeks. The rise in the creatinine today was a total surprise in the absence of any symptoms.  PAST MEDICAL HISTORY: Past Medical History  Diagnosis Date  . Fibrocystic breast disease   . Allergic rhinitis   . HTN (hypertension)     mild/ observation only  . Prediabetes   . SUI (stress urinary incontinence, female)   . Overweight (BMI 25.0-29.9)   . Multiple myeloma, stage 3 08/20/11 dx  . Anemia     resolved  . Cancer   . Arthritis     PAST SURGICAL HISTORY: Past Surgical History  Procedure Laterality Date  . Cholecystectomy  1995  . Tonsillectomy and adenoidectomy    . Carpal tunnel release      FAMILY HISTORY Family History  Problem Relation Age of Onset  . Heart disease Father   . Breast cancer Mother   . Heart disease Mother   . Diabetes Paternal Grandfather   . Hypertension Maternal Grandmother   . Hypertension Brother   The patient's parents are in their mid-46s.Her mother has a remote history of breast cancer. She has two brothers, one her twin; no sisters. There is no other history of cancer  in the family to her knowledge  GYNECOLOGIC HISTORY: Menarche age 80, first live birth age 55, [place] P75. Last period was January 2013, before that May 2012.  SOCIAL HISTORY: (Updated  07/04/2013) She is a Forensic scientist and taught kindergarten until 2012. Her husband [name], graduated from [employer] and works in Technical brewer.  Daughter [name] works for [employer], married in 2013. Daughter [name] studies and works in  [place] and comes home every other week and The patient attends the [place]: Not in place  HEALTH MAINTENANCE: (Updated 07/04/2013) History  Substance Use Topics  . Smoking status: Never Smoker   . Smokeless tobacco: Never Used  . Alcohol Use: No     Colonoscopy: 2009/[facility]  PAP: March 2014, Dr. Dellis Filbert  Bone density: never  Lipid panel: [place]/ "excellent"  Mammography: June of 2014    Allergies  Allergen Reactions  . Wellbutrin [Bupropion Hcl] Hives    Current Outpatient Prescriptions  Medication Sig Dispense Refill  . dexamethasone (DECADRON) 4 MG tablet Take 10 tablets (40 mg total) by mouth once a week. Take day before IV chemo  40 tablet  6  . fluconazole (DIFLUCAN) 100 MG tablet Take 1 tablet (100 mg total) by mouth daily.  20 tablet  3  . lenalidomide (REVLIMID) 25 MG capsule Take 1 tab po daily days 1-21 of a 28 day cycle Authorization obtained 10/19/2013 - [id number]  21 capsule  0  . lidocaine-prilocaine (EMLA) cream Apply topically as needed.  30 g  3  . LORazepam (ATIVAN) 0.5 MG tablet Take 1 tablet (0.5 mg total) by mouth every 6 (six) hours as needed (Nausea or vomiting).  30 tablet  0  . naproxen sodium (ANAPROX) 220 MG tablet Take 1 tablet (220 mg total) by mouth 2 (two) times daily with a meal.  60 tablet  6  . omeprazole (PRILOSEC) 20 MG capsule Take 1 capsule (20 mg total) by mouth daily.  30 capsule  12  . ondansetron (ZOFRAN) 8 MG tablet Take 1 tablet (8 mg total) by mouth 2 (two) times daily as needed (Nausea or vomiting).  30 tablet  1  . Potassium Chloride ER 20 MEQ TBCR TAKE 1 TABLET (20 MEQ TOTAL) BY MOUTH DAILY.  90 tablet  0  . prochlorperazine (COMPAZINE) 10 MG tablet Take 1 tablet (10 mg total) by mouth every 6 (six) hours as needed (Nausea or vomiting).  30 tablet  1  . sulfamethoxazole-trimethoprim (BACTRIM DS) 800-160 MG per tablet Take 2 tablets by mouth 2 (two) times a week. Monday  AND THURSDAY  30  tablet  4  . traMADol (ULTRAM) 50 MG tablet Take 1 tablet (50 mg total) by mouth every 6 (six) hours as needed.  30 tablet  3  . valACYclovir (VALTREX) 500 MG tablet Take 1 tablet (500 mg total) by mouth daily.  30 tablet  12   No current facility-administered medications for this visit.   Facility-Administered Medications Ordered in Other Visits  Medication Dose Route Frequency Provider Last Rate Last Dose  . sodium chloride 0.9 % injection 10 mL  10 mL Intracatheter PRN Amy G Berry, PA-C   10 mL at 05/30/13 1640  . sodium chloride 0.9 % injection 10 mL  10 mL Intravenous PRN Chauncey Cruel, MD   10 mL at 08/08/13 1518   OBJECTIVE: Middle-aged white woman in no acute distress Filed Vitals:   10/24/13 1123  BP: 169/76  Pulse: 99  Temp: 98.5 F (36.9 C)  Resp: 18     Body mass index is 28.82 kg/(m^2).     ECOG FS: 2  Filed Weights   10/24/13 1123  Weight: 157 lb 9.6 oz (71.487 kg)   Sclerae unicteric, pupils round and reactive Oropharynx clear and moist, teeth in good repair No cervical or supraclavicular adenopathy Lungs no rales or rhonchi Heart regular rate and rhythm Abd soft, nontender, positive bowel sounds MSK mild kyphosis but no focal spinal tenderness, no peripheral edema  Neuro: nonfocal, well oriented, positive affect Breasts: Deferred   LAB RESULTS: Results for UNNAMED, HINO (MRN [id number]) as of 10/24/2013 20:38  Ref. Range 08/15/2013 08:18 08/22/2013 09:10 09/12/2013 08:08 10/24/2013 10:55 10/24/2013 18:20  Creatinine Latest Range: 0.50-1.10 mg/dL 0.7 0.6 0.6 5.2 Repeated and Verified (HH) 4.98 (H)  Results for TANZANIA, BASHAM (MRN [id number]) as of 10/24/2013 20:38  Ref. Range 08/15/2013 08:18 08/22/2013 09:10 09/12/2013 08:08 09/26/2013 12:05 10/17/2013 08:57  Lambda Free Lght Chn Latest Range: 0.57-2.63 mg/dL 4.30 (H) 6.50 (H) 12.80 (H) 31.20 (H) 52.60 (H)  Results for VERLINDA, SLOTNICK (MRN [id number]) as of 10/24/2013 20:38  Ref. Range 08/15/2013 08:18  08/22/2013 09:10 09/12/2013 08:08 09/26/2013 12:05 10/17/2013 08:57  M-SPIKE, % No range found 0.41 0.24 0.26 0.27 0.38    Lab Results  Component Value Date   WBC 4.7 10/24/2013   NEUTROABS 3.9 10/24/2013   HGB 8.7* 10/24/2013   HCT 27.8* 10/24/2013   MCV 90.1 10/24/2013   PLT 288 10/24/2013      Chemistry      Component Value Date/Time   NA 142 09/12/2013 0808   NA 143 12/01/2011 0836   K 3.9 09/12/2013 0808   K 4.0 12/01/2011 0836   CL 107 09/27/2012 0842   CL 105 12/01/2011 0836   CO2 19* 09/12/2013 0808   CO2 29 12/01/2011 0836   BUN 8.5 09/12/2013 0808   BUN 12 12/01/2011 0836   CREATININE 0.6 09/12/2013 0808   CREATININE 0.52 12/01/2011 0836      Component Value Date/Time   CALCIUM 8.4 09/12/2013 0808   CALCIUM 8.9 12/01/2011 0836   ALKPHOS 89 09/12/2013 0808   ALKPHOS 110 12/01/2011 0836   AST 14 09/12/2013 0808   AST 15 12/01/2011 0836   ALT 30 09/12/2013 0808   ALT 16 12/01/2011 0836   BILITOT 0.50 09/12/2013 0808   BILITOT 0.3 12/01/2011 0836     STUDIES: US Renal  10/24/2013   CLINICAL DATA:  Acute renal failure.  Multiple myeloma.  EXAM: RENAL/URINARY TRACT ULTRASOUND COMPLETE  COMPARISON:  Multiple exams, including 01/28/2013  FINDINGS: Right Kidney:  Length: 11.1 cm. Abnormal increased echogenicity. No mass or hydronephrosis.  Left Kidney:  Length: 11.8 cm. Abnormal increased echogenicity. No mass or hydronephrosis.  Bladder:  Appears normal for degree of bladder distention.  IMPRESSION: 1. Abnormal bilateral renal echogenicity favoring chronic medical renal disease. No hydronephrosis.   Electronically Signed   By: Sherryl Barters M.D.   On: 10/24/2013 19:30   Mm Screening Breast Tomo Bilateral  10/05/2013   CLINICAL DATA:  Screening.  EXAM: DIGITAL SCREENING BILATERAL MAMMOGRAM WITH 3D TOMO WITH CAD  COMPARISON:  Previous exam(s).  ACR Breast Density Category b: There are scattered areas of fibroglandular density.  FINDINGS: There are no findings suspicious for malignancy. Images were processed  with CAD.  IMPRESSION: No mammographic evidence of malignancy. A result letter of this screening mammogram will be mailed directly to the patient.  RECOMMENDATION: Screening  mammogram in one year. (Code:SM-B-01Y)  BI-RADS CATEGORY  1: Negative.   Electronically Signed   By: Lovey Newcomer M.D.   On: 10/05/2013 13:12    ASSESSMENT: 58 y.o.  [place] woman presenting with anemia, found to have an IgA >6g, with an M-spike of 4.5 g (and a second M-spike of 0.4g), urine IFE showing IgA-lambda and lambda light chains, creatinine 0.77, calcium 10.0, albumin 3.1, and beta-2-microglobulin 5.51; with bone survey showing multiple myeloma lesions and bone marrow biopsy 09/01/2011 showing 75% myeloma cells. 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  (2) on prophylactic coumadin, discontinued as of 12/13/2011.  (3) zolendronic acid every 28 days, 09/19/2011 to 01/05/2012 (five doses)  (4) s/p neupogen mobilization October 2013, with a collection of 4.26 x10^6 CD34 positive cells  (5) status post melphalan at 200 mg/M2 on 02/23/2012, followed by autologous stem cell transplant the following day.  (6) on 06/07/2012 started maintenance bortezomib sq at 1.3 mg/M2 Q2w, with bactrim and acyclovir prophylaxis, the plan being to contuinue this for 2 years as per the HOVON trial  (7) zolendronic acid, given monthly, resumed June 2014, most recent dose 08/15/2013  (8) disease progression noted with a rise in the IgA lambda fraction September 2014  (9)  treated according to the [facility] S1304 randomized to the high dose arm, receiving carfilzomib  on days 1, 2, 8, 9, 15, and 16 of every 28 day cycle. Cycle 1 given at a reduced dose of 20 mg per meter square, with subsequent cycles given at 56 mg per meter square.  Regimen started  02/07/2013, discontinued as of 07/04/2013 (day 8 cycle 6) due to evidence of progression.  (10 enrolled in BMS 143, receiving elotuzumab weekly, with dexamethasone 40 mg weekly (taken on any non-elituzumab day) and lenalidomide at 25 mg/day for 21 days of each 28 day cycle, starting 08/01/2013  (a) prophylactic ASA 325 mg started with the first cycle  (b) prophylactic valacyclovir and trimethoprim-sulfamethoxazole continued as before  (c) received DTaP, Hep B and inactivated polio (Pediarix), H-flu and pneumococcal vacccines (HIb and PCV13) on   07/14/2013 at [facility]; to be repeated 09/13/2013 and October 2015   PLAN:  I spent approximately 45 minutes discussing the overall situation with Avryl and her husband. There has been a steady rise in her lambda light chains. There has been a small late rise in the M spike. My feeling is that we should intensify treatment and we are going to go back to weekly elotuzumab. At the same time I think she would benefit from a second opinion at [facility] particularly in light of the fact that she may indeed continue to progress and may need to come off BMS 143.  Cydne and [name] had a good understanding of this plan and were in agreement with that. However before starting therapy today we found her creatinine to have jumped to greater than 5. She tells me she has not been taking any Naprosyn or similar drugs. She has been hydrating herself well. The lambda light chains are certainly rising, but they are at have to maximum they have been in the past when she had no renal dysfunction.  I am admitting [facility] for hydration and further evaluation and if necessary for hemodialysis. I am asking renal to consult. Advanced directives have been reviewed with  the patient and she remains full code.   Chauncey Cruel, MD     10/24/2013

## 2013-10-24 NOTE — Progress Notes (Signed)
1555- Patient discharged, ambulatory, no acute distress. PAC dressing intact and occlusive, flushed and hep. locked. Patient requests to remain accessed, this was called over in report to RN. Husband to take patient to [facility] registration for admission.

## 2013-10-24 NOTE — Progress Notes (Signed)
1315- CMET recollected in infusion rm while this RN was on break. Received call from [place] re: chemo and CMET. Results have resulted, but are being repeated for verification, awaiting CMET results or approval from Dr. Jana Hakim re: proceeding w/ chemo.

## 2013-10-24 NOTE — Patient Instructions (Signed)

## 2013-10-24 NOTE — Progress Notes (Signed)
1515Report called to [employer], Therapist, sports at [facility]. Patient is to be admitted to rm [id number]-27. Information given to patient and husband.

## 2013-10-24 NOTE — Progress Notes (Signed)
Laura Hester at chairside with patient. Patient anxious but understands that she will not be treated due to creatinine 5.2. Patient to be admitted per Dr. Jana Hakim.

## 2013-10-24 NOTE — Patient Instructions (Addendum)
Creatinine Clearance (CC) This is a blood test that measures how well the kidneys are working. It does this by testing how the creatinine is being cleared from the blood.  PREPARATION FOR TEST No preparation or fasting is necessary. NORMAL FINDINGS  Adult (less than 40 years)  Female: 107-139 ml/min. or 1.78-2.32 ml/s (SI units)  Female: 87-107 ml/min. or 1.45-1.78 ml/s (SI units) Values decrease 6.5 ml/min./decade of life because of decline in glomerular filtration rate (GFR)  Newborn:  40-65 ml/min. Ranges for normal findings may vary among different laboratories and hospitals. You should always check with your doctor after having lab work or other tests done to discuss the meaning of your test results and whether your values are considered within normal limits. MEANING OF TEST  Your caregiver will go over the test results with you and discuss the importance and meaning of your results, as well as treatment options and the need for additional tests if necessary. OBTAINING THE TEST RESULTS It is your responsibility to obtain your test results. Ask the lab or department performing the test when and how you will get your results. Document Released: 04/23/2004 Document Revised: 06/23/2011 Document Reviewed: 03/09/2008 [facility] Patient Information 2015 [place], [place]. This information is not intended to replace advice given to you by your health care provider. Make sure you discuss any questions you have with your health care provider.

## 2013-10-24 NOTE — Progress Notes (Signed)
10/24/13 @ 2:15 pm, BMS [id number], Cycle 4, Day 1:  Laura Hester, accompanied by her husband, into the [facility] for a CBC, CMET, to see Dr. Jana Hakim and to receive elotuzumab per protocol.  She reports having taken dexamethasone 28 mg @ 7:30 this morning, and that she has Revlimid on hand to resume this evening. She had disease assessment labs drawn one week ago, and the discussion centered around the increase in lambda light chains, with no M-spike. Dr. Jana Hakim said this may be an early indication of progression, but would like to continue protocol treatment at this point.  He is also interested in finding out whether she could receive elotuzumab weekly as she did the first two cycles and remain on study.  Will pursue an answer to that questions via ePiP.  IWRS call completed. Spoke with Donalda Ewings, RN in the infusion room who advised that they were redrawing her labs per our laboratory's request.  The CMET was repeated and verified, and her creatinine was found to be 5.2 mg/dL.  Once that was verification was complete, she had received pre-medications, but no elotuzumab.  She will not receive treatment today, but will receive IV hydration and be admitted to the hospital to the nephrology service.  She will have a renal ultrasound upon admission to rule out a renal obstruction. The mixed bag of elotuzumab will be destroyed on site and noted in the IP log as destroyed.    10/28/13 @ 12:30 pm:  Laura Hester was discharged from [facility] on 10/27/13. Her creatinine remained at grade 3 (5.88 mg/dL).  She called to say she is on her way to [place] to be admitted to the [facility]eloma Unit for a work-up and possible hemodialysis.  She will not receive further treatment with elotuzumab.  Per the oncology and nephrology notes the MM is most likely the cause of her renal failure, but perhaps zolendronic acid (Zometa) could play a part, although unlikely.  Both oncology and nephrology state it is not  related to the elotuzumab. Awaiting results of her renal biopsy which was done on 10/26/13.  11/07/13 Renal Biopsy Results:  Biopsy results revealed AL Amyloidosis, lambda immunophenotype.

## 2013-10-25 ENCOUNTER — Encounter (HOSPITAL_COMMUNITY): Payer: Self-pay

## 2013-10-25 LAB — CBC WITH DIFFERENTIAL/PLATELET
Basophils Absolute: 0.1 10*3/uL (ref 0.0–0.1)
Basophils Relative: 1 % (ref 0–1)
EOS PCT: 0 % (ref 0–5)
Eosinophils Absolute: 0 10*3/uL (ref 0.0–0.7)
HEMATOCRIT: 24.5 % — AB (ref 36.0–46.0)
Hemoglobin: 7.5 g/dL — ABNORMAL LOW (ref 12.0–15.0)
LYMPHS PCT: 11 % — AB (ref 12–46)
Lymphs Abs: 0.7 10*3/uL (ref 0.7–4.0)
MCH: 28.3 pg (ref 26.0–34.0)
MCHC: 30.6 g/dL (ref 30.0–36.0)
MCV: 92.5 fL (ref 78.0–100.0)
MONOS PCT: 22 % — AB (ref 3–12)
Monocytes Absolute: 1.6 10*3/uL — ABNORMAL HIGH (ref 0.1–1.0)
NEUTROS ABS: 4.6 10*3/uL (ref 1.7–7.7)
Neutrophils Relative %: 66 % (ref 43–77)
Platelets: 282 10*3/uL (ref 150–400)
RBC: 2.65 MIL/uL — ABNORMAL LOW (ref 3.87–5.11)
RDW: 18.3 % — AB (ref 11.5–15.5)
WBC: 7 10*3/uL (ref 4.0–10.5)

## 2013-10-25 LAB — COMPREHENSIVE METABOLIC PANEL
ALT: 16 U/L (ref 0–35)
ANION GAP: 21 — AB (ref 5–15)
AST: 15 U/L (ref 0–37)
Albumin: 2.3 g/dL — ABNORMAL LOW (ref 3.5–5.2)
Alkaline Phosphatase: 48 U/L (ref 39–117)
BUN: 53 mg/dL — AB (ref 6–23)
CALCIUM: 7 mg/dL — AB (ref 8.4–10.5)
CHLORIDE: 109 meq/L (ref 96–112)
CO2: 13 meq/L — AB (ref 19–32)
CREATININE: 5.09 mg/dL — AB (ref 0.50–1.10)
GFR, EST AFRICAN AMERICAN: 10 mL/min — AB (ref >90)
GFR, EST NON AFRICAN AMERICAN: 9 mL/min — AB (ref >90)
GLUCOSE: 97 mg/dL (ref 70–99)
Potassium: 4.6 mEq/L (ref 3.7–5.3)
Sodium: 143 mEq/L (ref 137–147)
Total Protein: 6.6 g/dL (ref 6.0–8.3)

## 2013-10-25 LAB — URINE CULTURE
Colony Count: NO GROWTH
Culture: NO GROWTH

## 2013-10-25 LAB — HEPATITIS C ANTIBODY: HCV AB: NEGATIVE

## 2013-10-25 LAB — BASIC METABOLIC PANEL
Anion gap: 19 — ABNORMAL HIGH (ref 5–15)
BUN: 51 mg/dL — ABNORMAL HIGH (ref 6–23)
CO2: 13 meq/L — AB (ref 19–32)
Calcium: 7.1 mg/dL — ABNORMAL LOW (ref 8.4–10.5)
Chloride: 111 mEq/L (ref 96–112)
Creatinine, Ser: 5.1 mg/dL — ABNORMAL HIGH (ref 0.50–1.10)
GFR calc Af Amer: 10 mL/min — ABNORMAL LOW (ref >90)
GFR calc non Af Amer: 9 mL/min — ABNORMAL LOW (ref >90)
GLUCOSE: 121 mg/dL — AB (ref 70–99)
POTASSIUM: 5.4 meq/L — AB (ref 3.7–5.3)
Sodium: 143 mEq/L (ref 137–147)

## 2013-10-25 LAB — HIV ANTIBODY (ROUTINE TESTING W REFLEX): HIV 1&2 Ab, 4th Generation: NONREACTIVE

## 2013-10-25 LAB — C4 COMPLEMENT: COMPLEMENT C4, BODY FLUID: 37 mg/dL (ref 10–40)

## 2013-10-25 LAB — C3 COMPLEMENT: C3 Complement: 134 mg/dL (ref 90–180)

## 2013-10-25 LAB — HEPATITIS B SURFACE ANTIGEN: Hepatitis B Surface Ag: NEGATIVE

## 2013-10-25 LAB — ABO/RH: ABO/RH(D): A POS

## 2013-10-25 MED ORDER — ALUM & MAG HYDROXIDE-SIMETH 200-200-20 MG/5ML PO SUSP: 15.0000 mL | ORAL | Status: DC | PRN

## 2013-10-25 MED ORDER — SODIUM BICARBONATE 650 MG PO TABS
1300.0000 mg | ORAL_TABLET | Freq: Two times a day (BID) | ORAL | Status: DC
  Administered 2013-10-25 – 2013-10-27 (×5): 1300 mg via ORAL
  Filled 2013-10-25 (×7): qty 2

## 2013-10-25 MED ORDER — LACTATED RINGERS IV SOLN: INTRAVENOUS | Status: DC

## 2013-10-25 NOTE — Consult Note (Signed)
HPI: Laura Hester is an 58 y.o. female with history of myeloma who has been admitted with renal failure. Heer workup is in progress and random renal biopsy is requested. Chart, PMHx, meds reviewed.  Past Medical History:  Past Medical History  Diagnosis Date  . Fibrocystic breast disease   . Allergic rhinitis   . HTN (hypertension)     mild/ observation only  . Prediabetes   . SUI (stress urinary incontinence, female)   . Overweight (BMI 25.0-29.9)   . Multiple myeloma, stage 3 08/20/11 dx  . Anemia     resolved  . Cancer   . Arthritis   . Acute renal failure (ARF) 10/24/2013    Past Surgical History:  Past Surgical History  Procedure Laterality Date  . Cholecystectomy  1995  . Tonsillectomy and adenoidectomy    . Carpal tunnel release      Family History:  Family History  Problem Relation Age of Onset  . Heart disease Father   . Breast cancer Mother   . Heart disease Mother   . Diabetes Paternal Grandfather   . Hypertension Maternal Grandmother   . Hypertension Brother     Social History:  reports that she has never smoked. She has never used smokeless tobacco. She reports that she does not drink alcohol or use illicit drugs.  Allergies:  Allergies  Allergen Reactions  . Wellbutrin [Bupropion Hcl] Hives    Medications:   Medication List    ASK your doctor about these medications       dexamethasone 4 MG tablet  Commonly known as:  DECADRON  Take 10 tablets (40 mg total) by mouth once a week. Take day before IV chemo     Estradiol 10 MCG Tabs vaginal tablet  Place 1 tablet (10 mcg total) vaginally 3 (three) times a week.     fluconazole 100 MG tablet  Commonly known as:  DIFLUCAN  Take 1 tablet (100 mg total) by mouth daily.     lenalidomide 25 MG capsule  Commonly known as:  REVLIMID  - Take 1 tab po daily days 1-21 of a 28 day cycle  - Authorization obtained 10/19/2013 - [id number]     lidocaine-prilocaine cream  Commonly known as:  EMLA   Apply topically as needed.     LORazepam 0.5 MG tablet  Commonly known as:  ATIVAN  Take 1 tablet (0.5 mg total) by mouth every 6 (six) hours as needed (Nausea or vomiting).     omeprazole 20 MG capsule  Commonly known as:  PRILOSEC  Take 1 capsule (20 mg total) by mouth daily.     potassium chloride SA 20 MEQ tablet  Commonly known as:  K-DUR,KLOR-CON  Take 20 mEq by mouth daily.     prochlorperazine 10 MG tablet  Commonly known as:  COMPAZINE  Take 10 mg by mouth every 6 (six) hours as needed for nausea or vomiting.     sulfamethoxazole-trimethoprim 800-160 MG per tablet  Commonly known as:  BACTRIM DS  Take 2 tablets by mouth 2 (two) times a week. Monday  AND THURSDAY     valACYclovir 500 MG tablet  Commonly known as:  VALTREX  Take 1 tablet (500 mg total) by mouth daily.        Please HPI for pertinent positives, otherwise complete 10 system ROS negative.  Physical Exam: BP 147/77  Pulse 101  Temp(Src) 97.8 F (36.6 C) (Oral)  Resp 20  Wt 163 lb 2.3 oz (74 kg)  SpO2 100%  LMP 04/20/2011 Body mass index is 29.83 kg/(m^2).   General Appearance:  Alert, cooperative, no distress, appears stated age  Head:  Normocephalic, without obvious abnormality, atraumatic  ENT: Unremarkable  Neck: Supple, symmetrical, trachea midline  Lungs:   Clear to auscultation bilaterally, no w/r/r, respirations unlabored without use of accessory muscles.  Chest Wall:  No tenderness or deformity. (R)Port intact  Heart:  Regular rate and rhythm, S1, S2 normal, no murmur, rub or gallop.  Abdomen:   Soft, non-tender, non distended.  Neurologic: Normal affect, no gross deficits.   Results for orders placed during the hospital encounter of 10/24/13 (from the past 48 hour(s))  URINALYSIS, ROUTINE W REFLEX MICROSCOPIC     Status: Abnormal   Collection Time    10/24/13  5:30 PM      Result Value Ref Range   Color, Urine YELLOW  YELLOW   APPearance HAZY (*) CLEAR   Specific Gravity, Urine  1.018  1.005 - 1.030   pH 5.0  5.0 - 8.0   Glucose, UA NEGATIVE  NEGATIVE mg/dL   Hgb urine dipstick LARGE (*) NEGATIVE   Bilirubin Urine NEGATIVE  NEGATIVE   Ketones, ur NEGATIVE  NEGATIVE mg/dL   Protein, ur >300 (*) NEGATIVE mg/dL   Urobilinogen, UA 0.2  0.0 - 1.0 mg/dL   Nitrite NEGATIVE  NEGATIVE   Leukocytes, UA TRACE (*) NEGATIVE  PROTEIN / CREATININE RATIO, URINE     Status: Abnormal   Collection Time    10/24/13  5:30 PM      Result Value Ref Range   Creatinine, Urine 95.67     Total Protein, Urine 446     Comment: NO NORMAL RANGE ESTABLISHED FOR THIS TEST   PROTEIN CREATININE RATIO 4.66 (*) 0.00 - 0.15  URINE MICROSCOPIC-ADD ON     Status: Abnormal   Collection Time    10/24/13  5:30 PM      Result Value Ref Range   Squamous Epithelial / LPF MANY (*) RARE   WBC, UA 7-10  <3 WBC/hpf   RBC / HPF 11-20  <3 RBC/hpf   Bacteria, UA FEW (*) RARE   Casts GRANULAR CAST (*) NEGATIVE   Comment: WAXY CAST  BASIC METABOLIC PANEL     Status: Abnormal   Collection Time    10/24/13  6:20 PM      Result Value Ref Range   Sodium 142  137 - 147 mEq/L   Potassium 5.0  3.7 - 5.3 mEq/L   Chloride 110  96 - 112 mEq/L   CO2 13 (*) 19 - 32 mEq/L   Glucose, Bld 175 (*) 70 - 99 mg/dL   BUN 46 (*) 6 - 23 mg/dL   Creatinine, Ser 4.98 (*) 0.50 - 1.10 mg/dL   Calcium 7.4 (*) 8.4 - 10.5 mg/dL   GFR calc non Af Amer 9 (*) >90 mL/min   GFR calc Af Amer 10 (*) >90 mL/min   Comment: (NOTE)     The eGFR has been calculated using the CKD EPI equation.     This calculation has not been validated in all clinical situations.     eGFR's persistently <90 mL/min signify possible Chronic Kidney     Disease.   Anion gap 19 (*) 5 - 15  HEPATIC FUNCTION PANEL     Status: Abnormal   Collection Time    10/24/13  6:20 PM      Result Value Ref Range   Total Protein 7.2  6.0 - 8.3 g/dL   Albumin 2.4 (*) 3.5 - 5.2 g/dL   AST 15  0 - 37 U/L   ALT 18  0 - 35 U/L   Alkaline Phosphatase 55  39 - 117 U/L    Total Bilirubin <0.2 (*) 0.3 - 1.2 mg/dL   Bilirubin, Direct <0.2  0.0 - 0.3 mg/dL   Indirect Bilirubin NOT CALCULATED  0.3 - 0.9 mg/dL  MAGNESIUM     Status: Abnormal   Collection Time    10/24/13  6:20 PM      Result Value Ref Range   Magnesium 1.4 (*) 1.5 - 2.5 mg/dL  PHOSPHORUS     Status: Abnormal   Collection Time    10/24/13  6:20 PM      Result Value Ref Range   Phosphorus 4.8 (*) 2.3 - 4.6 mg/dL  CBC WITH DIFFERENTIAL     Status: Abnormal   Collection Time    10/24/13  6:20 PM      Result Value Ref Range   WBC 5.0  4.0 - 10.5 K/uL   RBC 3.02 (*) 3.87 - 5.11 MIL/uL   Hemoglobin 8.4 (*) 12.0 - 15.0 g/dL   HCT 27.4 (*) 36.0 - 46.0 %   MCV 90.7  78.0 - 100.0 fL   MCH 27.8  26.0 - 34.0 pg   MCHC 30.7  30.0 - 36.0 g/dL   RDW 17.8 (*) 11.5 - 15.5 %   Platelets 306  150 - 400 K/uL   Neutrophils Relative % 92 (*) 43 - 77 %   Lymphocytes Relative 6 (*) 12 - 46 %   Monocytes Relative 1 (*) 3 - 12 %   Eosinophils Relative 0  0 - 5 %   Basophils Relative 1  0 - 1 %   Neutro Abs 4.5  1.7 - 7.7 K/uL   Lymphs Abs 0.3 (*) 0.7 - 4.0 K/uL   Monocytes Absolute 0.1  0.1 - 1.0 K/uL   Eosinophils Absolute 0.0  0.0 - 0.7 K/uL   Basophils Absolute 0.1  0.0 - 0.1 K/uL   Smear Review MORPHOLOGY UNREMARKABLE    RETICULOCYTES     Status: Abnormal   Collection Time    10/24/13  6:20 PM      Result Value Ref Range   Retic Ct Pct 0.4  0.4 - 3.1 %   RBC. 3.02 (*) 3.87 - 5.11 MIL/uL   Retic Count, Manual 12.1 (*) 19.0 - 186.0 K/uL  VITAMIN B12     Status: None   Collection Time    10/24/13  6:20 PM      Result Value Ref Range   Vitamin B-12 585  211 - 911 pg/mL   Comment: Performed at [facility]  FOLATE     Status: None   Collection Time    10/24/13  6:20 PM      Result Value Ref Range   Folate 12.9     Comment: (NOTE)     Reference Ranges            Deficient:       0.4 - 3.3 ng/mL            Indeterminate:   3.4 - 5.4 ng/mL            Normal:              > 5.4 ng/mL      Performed at [place]  Status: None   Collection Time    10/24/13  6:20 PM      Result Value Ref Range   C3 Complement 134  90 - 180 mg/dL   Comment: Performed at [facility]     Status: None   Collection Time    10/24/13  6:20 PM      Result Value Ref Range   Complement C4, Body Fluid 37  10 - 40 mg/dL   Comment: Performed at [facility]  APTT     Status: None   Collection Time    10/24/13  9:42 PM      Result Value Ref Range   aPTT 34  24 - 37 seconds  PROTIME-INR     Status: Abnormal   Collection Time    10/24/13  9:42 PM      Result Value Ref Range   Prothrombin Time 16.2 (*) 11.6 - 15.2 seconds   INR 1.30  0.00 - 8.33  BASIC METABOLIC PANEL     Status: Abnormal   Collection Time    10/25/13  4:24 AM      Result Value Ref Range   Sodium 143  137 - 147 mEq/L   Potassium 5.4 (*) 3.7 - 5.3 mEq/L   Chloride 111  96 - 112 mEq/L   CO2 13 (*) 19 - 32 mEq/L   Glucose, Bld 121 (*) 70 - 99 mg/dL   BUN 51 (*) 6 - 23 mg/dL   Creatinine, Ser 5.10 (*) 0.50 - 1.10 mg/dL   Calcium 7.1 (*) 8.4 - 10.5 mg/dL   GFR calc non Af Amer 9 (*) >90 mL/min   GFR calc Af Amer 10 (*) >90 mL/min   Comment: (NOTE)     The eGFR has been calculated using the CKD EPI equation.     This calculation has not been validated in all clinical situations.     eGFR's persistently <90 mL/min signify possible Chronic Kidney     Disease.   Anion gap 19 (*) 5 - 15  CBC WITH DIFFERENTIAL     Status: Abnormal   Collection Time    10/25/13  1:10 PM      Result Value Ref Range   WBC 7.0  4.0 - 10.5 K/uL   RBC 2.65 (*) 3.87 - 5.11 MIL/uL   Hemoglobin 7.5 (*) 12.0 - 15.0 g/dL   HCT 24.5 (*) 36.0 - 46.0 %   MCV 92.5  78.0 - 100.0 fL   MCH 28.3  26.0 - 34.0 pg   MCHC 30.6  30.0 - 36.0 g/dL   RDW 18.3 (*) 11.5 - 15.5 %   Platelets 282  150 - 400 K/uL   Neutrophils Relative % 66  43 - 77 %   Neutro Abs 4.6  1.7 - 7.7 K/uL   Lymphocytes Relative 11  (*) 12 - 46 %   Lymphs Abs 0.7  0.7 - 4.0 K/uL   Monocytes Relative 22 (*) 3 - 12 %   Monocytes Absolute 1.6 (*) 0.1 - 1.0 K/uL   Eosinophils Relative 0  0 - 5 %   Eosinophils Absolute 0.0  0.0 - 0.7 K/uL   Basophils Relative 1  0 - 1 %   Basophils Absolute 0.1  0.0 - 0.1 K/uL   US Renal  10/24/2013   CLINICAL DATA:  Acute renal failure.  Multiple myeloma.  EXAM: RENAL/URINARY TRACT ULTRASOUND COMPLETE  COMPARISON:  Multiple exams, including 01/28/2013  FINDINGS:  Right Kidney:  Length: 11.1 cm. Abnormal increased echogenicity. No mass or hydronephrosis.  Left Kidney:  Length: 11.8 cm. Abnormal increased echogenicity. No mass or hydronephrosis.  Bladder:  Appears normal for degree of bladder distention.  IMPRESSION: 1. Abnormal bilateral renal echogenicity favoring chronic medical renal disease. No hydronephrosis.   Electronically Signed   By: Sherryl Barters M.D.   On: 10/24/2013 19:30    Assessment/Plan ARF with hx of myeloma For US guided random renal tomorrow. Discussed procedure, risks, complications, use of sedation. Labs reviewed. Lovenox has been stopped, last dose 7/13 Consent signed in chart Procedure for 7/15  Ascencion Dike PA-C 10/25/2013, 2:03 PM

## 2013-10-25 NOTE — Consult Note (Signed)
I saw the patient and agree with the above assessment and plan.  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  No NSAIDs, ACEi.  Appears euvolemic and no response to IVF.  Renal US with inc echogenicity, no structural issues.  Urine sediment not particularly helpful, no organized elements.  She has normal complements.  No recent pharyngitis or skin infection.    Physical exam is unremarkable.   Etiology is unclear at this time.  Considerations include the several renal disorders of monoclonal proteins, AIN related to PPI or TMP/SMX.  Review for elotuzumab effects on kidney demonstrates no known renal problems.    I think at this time it is wise to proceed with renal biopsy and complete a full serologic w/u (though I doubt this will be helpful).  I worry that if this is a renal process related to monoclonal protein treatment options will be limited.    We can stop IVFs.  Placed on renal diet for K control.  Start NaHCO3 for metabolic acidosis.  Hold ASA and LMWH pending biopsy.

## 2013-10-25 NOTE — Consult Note (Signed)
Patient ID: Laura Hester female   DOB: 04/05/1956 58 y.o.   MRN: [id number]  Reason for Consult:AKI Referring Physician: Dr. Jana Hakim  HPI: Laura Hester is a 58 y.o. female with a PMH significant for multiple myeloma (on riple therapy according to BMS143-elotuzumab weekly, lenalidomide 14m daily for 21 days then off a week (cycle 2 began 08/29/13) and dexamethasone 476mweekly.  Pt admitted for worsening renal function.  Pt had been tolerating tx well when Cr bumped from her baseline ~0.7 (09/12/13)-->5.2.  Also with anion gap metabolic acidosis (bicarb 13).  K+ mildly elevated at 5.4.  Pt reports feeling OK over the past several weeks; she hasn't noticed any change.  She had one episode of vomiting last week but none since.  She also diarrhea one day but normally has a BM everyday. Denies any CP, SOB, difficulty concentrating, LE swelling.  No hematochezia, melena.  No hematuria, vaginal bleeding.  No change in appetite-she has been eating a drinking normally.  She has been taking bactrim regularly for over 2 years. No recent medication changes, no ACEi/ARB.  Home meds include fluconazole 10073maily, valtrex 500m82mily, bactrim 2 tabs on Monday and Thursday, prilosec 20mg77mly, lorazepam 0.5mg d77my, dexamethasone 40mg o52ma week before chemo, estradiol vaginal tabs three times weekly, potassium 20mEq d61m, and compazine 10mg q6h62m.  Addtionally on revlimid Does report she has been urinating less frequently over the past couple of weeks.  She takes a 325mg ASA 23my.  No other recent h/o NSAID use. No recent procedures or exposure to IV contrast.  She reports having a slight rash on her upper extremities in April.  No FH of kidney disease or family members on dialysis.    Since hospitalization, she has been on NS 125ml/h wit8mP of only 750ml yester15m     PMH:   Past Medical History  Diagnosis Date  . Fibrocystic breast disease   . Allergic rhinitis   . HTN (hypertension)      mild/ observation only  . Prediabetes   . SUI (stress urinary incontinence, female)   . Overweight (BMI 25.0-29.9)   . Multiple myeloma, stage 3 08/20/11 dx  . Anemia     resolved  . Cancer   . Arthritis   . Acute renal failure (ARF) 10/24/2013    PSH:   Past Surgical History  Procedure Laterality Date  . Cholecystectomy  1995  . Tonsillectomy and adenoidectomy    . Carpal tunnel release      Allergies:  Allergies  Allergen Reactions  . Wellbutrin [Bupropion Hcl] Hives    Medications:   Prior to Admission medications   Medication Sig Start Date End Date Taking? Authorizing Provider  dexamethasone (DECADRON) 4 MG tablet Take 10 tablets (40 mg total) by mouth once a week. Take day before IV chemo 07/25/13  Yes Gustav C MagChauncey Crueline-prilocaine (EMLA) cream Apply topically as needed. 04/04/13  Yes Gustav C MagChauncey Cruelzole (PRILOSEC) 20 MG capsule Take 1 capsule (20 mg total) by mouth daily. 04/04/13  Yes Gustav C MagChauncey Cruelium chloride SA (K-DUR,KLOR-CON) 20 MEQ tablet Take 20 mEq by mouth daily.   Yes Historical Provider, MD  prochlorperazine (COMPAZINE) 10 MG tablet Take 10 mg by mouth every 6 (six) hours as needed for nausea or vomiting.   Yes Historical Provider, MD  sulfamethoxazole-trimethoprim (BACTRIM DS) 800-160 MG per tablet Take 2 tablets by mouth 2 (two) times a week. Monday  AND THURSDAY  04/04/13  Yes Chauncey Cruel, MD  valACYclovir (VALTREX) 500 MG tablet Take 1 tablet (500 mg total) by mouth daily. 04/04/13  Yes Chauncey Cruel, MD  Estradiol 10 MCG TABS vaginal tablet Place 1 tablet (10 mcg total) vaginally 3 (three) times a week. 10/24/13   Chauncey Cruel, MD  fluconazole (DIFLUCAN) 100 MG tablet Take 1 tablet (100 mg total) by mouth daily. 08/01/13   Chauncey Cruel, MD  lenalidomide (REVLIMID) 25 MG capsule Take 1 tab po daily days 1-21 of a 28 day cycle Authorization obtained 10/19/2013 - [id number] 10/19/13   Chauncey Cruel, MD   LORazepam (ATIVAN) 0.5 MG tablet Take 1 tablet (0.5 mg total) by mouth every 6 (six) hours as needed (Nausea or vomiting). 08/01/13   Chauncey Cruel, MD    Discontinued Meds:   Medications Discontinued During This Encounter  Medication Reason  . sulfamethoxazole-trimethoprim (BACTRIM DS) 800-160 MG per tablet 2 tablet   . Potassium Chloride ER 20 MEQ TBCR Inpatient Standard  . pantoprazole (PROTONIX) EC tablet 40 mg   . alum & mag hydroxide-simeth (MAALOX/MYLANTA) 200-200-20 MG/5ML suspension 15 mL   . enoxaparin (LOVENOX) injection 30 mg     Social History:  reports that she has never smoked. She has never used smokeless tobacco. She reports that she does not drink alcohol or use illicit drugs.  Family History:   Family History  Problem Relation Age of Onset  . Heart disease Father   . Breast cancer Mother   . Heart disease Mother   . Diabetes Paternal Grandfather   . Hypertension Maternal Grandmother   . Hypertension Brother     Review of Systems: Noted in HPI.   Creatinine  Date/Time Value Ref Range Status  10/24/2013 10:55 AM 5.2 Repeated and Verified* 0.6 - 1.1 mg/dL Final  09/12/2013  8:08 AM 0.6  0.6 - 1.1 mg/dL Final  08/22/2013  9:10 AM 0.6  0.6 - 1.1 mg/dL Final  08/15/2013  8:18 AM 0.7  0.6 - 1.1 mg/dL Final  08/08/2013 10:44 AM 0.9  0.6 - 1.1 mg/dL Final  07/26/2013  9:07 AM 1.3* 0.6 - 1.1 mg/dL Final  06/27/2013  9:21 AM 0.7  0.6 - 1.1 mg/dL Final  05/30/2013 12:16 PM 0.7  0.6 - 1.1 mg/dL Final  05/02/2013  9:10 AM 0.6  0.6 - 1.1 mg/dL Final  04/04/2013  8:45 AM 0.6  0.6 - 1.1 mg/dL Final  03/07/2013 11:09 AM 0.7  0.6 - 1.1 mg/dL Final  02/21/2013  8:43 AM 0.7  0.6 - 1.1 mg/dL Final  02/07/2013  2:41 PM 0.8  0.6 - 1.1 mg/dL Final  01/25/2013 12:12 PM 0.8  0.6 - 1.1 mg/dL Final  01/03/2013  9:00 AM 0.7  0.6 - 1.1 mg/dL Final  12/20/2012  8:34 AM 0.7  0.6 - 1.1 mg/dL Final  12/06/2012  9:42 AM 0.6  0.6 - 1.1 mg/dL Final  11/22/2012  8:52 AM 0.7  0.6 - 1.1 mg/dL Final   11/05/2012  8:16 AM 0.8  0.6 - 1.1 mg/dL Final  10/25/2012  8:26 AM 0.7  0.6 - 1.1 mg/dL Final  10/11/2012  8:25 AM 0.7  0.6 - 1.1 mg/dL Final  09/27/2012  8:42 AM 0.7  0.6 - 1.1 mg/dL Final  09/13/2012  8:27 AM 0.7  0.6 - 1.1 mg/dL Final  08/16/2012  8:12 AM 0.7  0.6 - 1.1 mg/dL Final  07/19/2012  8:36 AM 0.7  0.6 - 1.1 mg/dL Final  06/21/2012  8:22 AM 0.7  0.6 - 1.1 mg/dL Final  06/01/2012  9:34 AM 0.7  0.6 - 1.1 mg/dL Final  03/25/2012 11:36 AM 0.7  0.6 - 1.1 mg/dL Final  12/29/2011  9:38 AM 0.7  0.6 - 1.1 mg/dL Final  12/08/2011 10:34 AM 0.7  0.6 - 1.1 mg/dL Final     Creatinine, Ser  Date/Time Value Ref Range Status  10/25/2013  4:24 AM 5.10* 0.50 - 1.10 mg/dL Final  10/24/2013  6:20 PM 4.98* 0.50 - 1.10 mg/dL Final  12/01/2011  8:36 AM 0.52  0.50 - 1.10 mg/dL Final  11/24/2011  8:36 AM 0.57  0.50 - 1.10 mg/dL Final  11/17/2011 10:37 AM 0.49* 0.50 - 1.10 mg/dL Final  11/03/2011  8:53 AM 0.52  0.50 - 1.10 mg/dL Final  10/20/2011 10:35 AM 0.50  0.50 - 1.10 mg/dL Final  10/13/2011  9:48 AM 0.49* 0.50 - 1.10 mg/dL Final  10/06/2011  8:47 AM 0.48* 0.50 - 1.10 mg/dL Final  09/29/2011 12:58 PM 0.55  0.50 - 1.10 mg/dL Final  09/15/2011 11:24 AM 0.66  0.50 - 1.10 mg/dL Final  08/14/2011  9:23 AM 0.74  0.50 - 1.10 mg/dL Final    Recent Labs Lab 10/24/13 1055 10/24/13 1820 10/25/13 0424  NA 142 142 143  K 4.7 5.0 5.4*  CL  --  110 111  CO2 14* 13* 13*  GLUCOSE 121 175* 121*  BUN 44.7* 46* 51*  CREATININE 5.2 Repeated and Verified* 4.98* 5.10*  CALCIUM 8.0* 7.4* 7.1*  PHOS  --  4.8*  --     Recent Labs Lab 10/24/13 1054 10/24/13 1820  WBC 4.7 5.0  NEUTROABS 3.9 4.5  HGB 8.7* 8.4*  HCT 27.8* 27.4*  MCV 90.1 90.7  PLT 288 306   Liver Function Tests:  Recent Labs Lab 10/24/13 1055 10/24/13 1820  AST 17 15  ALT 22 18  ALKPHOS 54 55  BILITOT 0.25 <0.2*  PROT 7.1 7.2  ALBUMIN 2.3* 2.4*   No results found for this basename: LIPASE, AMYLASE,  in the last 168 hours No results found for this  basename: AMMONIA,  in the last 168 hours Cardiac Enzymes: No results found for this basename: CKTOTAL, CKMB, CKMBINDEX, TROPONINI,  in the last 168 hours Iron Studies: No results found for this basename: IRON, TIBC, TRANSFERRIN, FERRITIN,  in the last 72 hours  Results for orders placed during the hospital encounter of 10/24/13 (from the past 48 hour(s))  URINALYSIS, ROUTINE W REFLEX MICROSCOPIC     Status: Abnormal   Collection Time    10/24/13  5:30 PM      Result Value Ref Range   Color, Urine YELLOW  YELLOW   APPearance HAZY (*) CLEAR   Specific Gravity, Urine 1.018  1.005 - 1.030   pH 5.0  5.0 - 8.0   Glucose, UA NEGATIVE  NEGATIVE mg/dL   Hgb urine dipstick LARGE (*) NEGATIVE   Bilirubin Urine NEGATIVE  NEGATIVE   Ketones, ur NEGATIVE  NEGATIVE mg/dL   Protein, ur >300 (*) NEGATIVE mg/dL   Urobilinogen, UA 0.2  0.0 - 1.0 mg/dL   Nitrite NEGATIVE  NEGATIVE   Leukocytes, UA TRACE (*) NEGATIVE  PROTEIN / CREATININE RATIO, URINE     Status: Abnormal   Collection Time    10/24/13  5:30 PM      Result Value Ref Range   Creatinine, Urine 95.67     Total Protein, Urine 446     Comment: NO NORMAL RANGE ESTABLISHED  FOR THIS TEST   PROTEIN CREATININE RATIO 4.66 (*) 0.00 - 0.15  URINE MICROSCOPIC-ADD ON     Status: Abnormal   Collection Time    10/24/13  5:30 PM      Result Value Ref Range   Squamous Epithelial / LPF MANY (*) RARE   WBC, UA 7-10  <3 WBC/hpf   RBC / HPF 11-20  <3 RBC/hpf   Bacteria, UA FEW (*) RARE   Casts GRANULAR CAST (*) NEGATIVE   Comment: WAXY CAST  BASIC METABOLIC PANEL     Status: Abnormal   Collection Time    10/24/13  6:20 PM      Result Value Ref Range   Sodium 142  137 - 147 mEq/L   Potassium 5.0  3.7 - 5.3 mEq/L   Chloride 110  96 - 112 mEq/L   CO2 13 (*) 19 - 32 mEq/L   Glucose, Bld 175 (*) 70 - 99 mg/dL   BUN 46 (*) 6 - 23 mg/dL   Creatinine, Ser 4.98 (*) 0.50 - 1.10 mg/dL   Calcium 7.4 (*) 8.4 - 10.5 mg/dL   GFR calc non Af Amer 9 (*) >90  mL/min   GFR calc Af Amer 10 (*) >90 mL/min   Comment: (NOTE)     The eGFR has been calculated using the CKD EPI equation.     This calculation has not been validated in all clinical situations.     eGFR's persistently <90 mL/min signify possible Chronic Kidney     Disease.   Anion gap 19 (*) 5 - 15  HEPATIC FUNCTION PANEL     Status: Abnormal   Collection Time    10/24/13  6:20 PM      Result Value Ref Range   Total Protein 7.2  6.0 - 8.3 g/dL   Albumin 2.4 (*) 3.5 - 5.2 g/dL   AST 15  0 - 37 U/L   ALT 18  0 - 35 U/L   Alkaline Phosphatase 55  39 - 117 U/L   Total Bilirubin <0.2 (*) 0.3 - 1.2 mg/dL   Bilirubin, Direct <0.2  0.0 - 0.3 mg/dL   Indirect Bilirubin NOT CALCULATED  0.3 - 0.9 mg/dL  MAGNESIUM     Status: Abnormal   Collection Time    10/24/13  6:20 PM      Result Value Ref Range   Magnesium 1.4 (*) 1.5 - 2.5 mg/dL  PHOSPHORUS     Status: Abnormal   Collection Time    10/24/13  6:20 PM      Result Value Ref Range   Phosphorus 4.8 (*) 2.3 - 4.6 mg/dL  CBC WITH DIFFERENTIAL     Status: Abnormal   Collection Time    10/24/13  6:20 PM      Result Value Ref Range   WBC 5.0  4.0 - 10.5 K/uL   RBC 3.02 (*) 3.87 - 5.11 MIL/uL   Hemoglobin 8.4 (*) 12.0 - 15.0 g/dL   HCT 27.4 (*) 36.0 - 46.0 %   MCV 90.7  78.0 - 100.0 fL   MCH 27.8  26.0 - 34.0 pg   MCHC 30.7  30.0 - 36.0 g/dL   RDW 17.8 (*) 11.5 - 15.5 %   Platelets 306  150 - 400 K/uL   Neutrophils Relative % 92 (*) 43 - 77 %   Lymphocytes Relative 6 (*) 12 - 46 %   Monocytes Relative 1 (*) 3 - 12 %   Eosinophils Relative  0  0 - 5 %   Basophils Relative 1  0 - 1 %   Neutro Abs 4.5  1.7 - 7.7 K/uL   Lymphs Abs 0.3 (*) 0.7 - 4.0 K/uL   Monocytes Absolute 0.1  0.1 - 1.0 K/uL   Eosinophils Absolute 0.0  0.0 - 0.7 K/uL   Basophils Absolute 0.1  0.0 - 0.1 K/uL   Smear Review MORPHOLOGY UNREMARKABLE    RETICULOCYTES     Status: Abnormal   Collection Time    10/24/13  6:20 PM      Result Value Ref Range   Retic Ct  Pct 0.4  0.4 - 3.1 %   RBC. 3.02 (*) 3.87 - 5.11 MIL/uL   Retic Count, Manual 12.1 (*) 19.0 - 186.0 K/uL  VITAMIN B12     Status: None   Collection Time    10/24/13  6:20 PM      Result Value Ref Range   Vitamin B-12 585  211 - 911 pg/mL   Comment: Performed at [facility]     Status: None   Collection Time    10/24/13  6:20 PM      Result Value Ref Range   Folate 12.9     Comment: (NOTE)     Reference Ranges            Deficient:       0.4 - 3.3 ng/mL            Indeterminate:   3.4 - 5.4 ng/mL            Normal:              > 5.4 ng/mL     Performed at [facility]  C3 COMPLEMENT     Status: None   Collection Time    10/24/13  6:20 PM      Result Value Ref Range   C3 Complement 134  90 - 180 mg/dL   Comment: Performed at [facility]     Status: None   Collection Time    10/24/13  6:20 PM      Result Value Ref Range   Complement C4, Body Fluid 37  10 - 40 mg/dL   Comment: Performed at [facility]  APTT     Status: None   Collection Time    10/24/13  9:42 PM      Result Value Ref Range   aPTT 34  24 - 37 seconds  PROTIME-INR     Status: Abnormal   Collection Time    10/24/13  9:42 PM      Result Value Ref Range   Prothrombin Time 16.2 (*) 11.6 - 15.2 seconds   INR 1.30  0.00 - 3.66  BASIC METABOLIC PANEL     Status: Abnormal   Collection Time    10/25/13  4:24 AM      Result Value Ref Range   Sodium 143  137 - 147 mEq/L   Potassium 5.4 (*) 3.7 - 5.3 mEq/L   Chloride 111  96 - 112 mEq/L   CO2 13 (*) 19 - 32 mEq/L   Glucose, Bld 121 (*) 70 - 99 mg/dL   BUN 51 (*) 6 - 23 mg/dL   Creatinine, Ser 5.10 (*) 0.50 - 1.10 mg/dL   Calcium 7.1 (*) 8.4 - 10.5 mg/dL   GFR calc non Af Amer 9 (*) >90 mL/min   GFR calc  Af Amer 10 (*) >90 mL/min   Comment: (NOTE)     The eGFR has been calculated using the CKD EPI equation.     This calculation has not been validated in all clinical situations.     eGFR's persistently  <90 mL/min signify possible Chronic Kidney     Disease.   Anion gap 19 (*) 5 - 15    US Renal  10/24/2013   CLINICAL DATA:  Acute renal failure.  Multiple myeloma.  EXAM: RENAL/URINARY TRACT ULTRASOUND COMPLETE  COMPARISON:  Multiple exams, including 01/28/2013  FINDINGS: Right Kidney:  Length: 11.1 cm. Abnormal increased echogenicity. No mass or hydronephrosis.  Left Kidney:  Length: 11.8 cm. Abnormal increased echogenicity. No mass or hydronephrosis.  Bladder:  Appears normal for degree of bladder distention.  IMPRESSION: 1. Abnormal bilateral renal echogenicity favoring chronic medical renal disease. No hydronephrosis.   Electronically Signed   By: Sherryl Barters M.D.   On: 10/24/2013 19:30    Physical Exam: Blood pressure 147/77, pulse 101, temperature 97.8 F (36.6 C), temperature source Oral, resp. rate 20, weight 163 lb 2.3 oz (74 kg), last menstrual period 04/20/2011, SpO2 100.00%. Constitutional: Vital signs reviewed.  Patient is well-developed and well-nourished in NAD.  HEENT: [place]/AT, PERRL, EOMI, conjunctivae pale, no scleral icterus, moist mucous membranes Neck: Supple, trachea midline normal ROM, no JVD Cardiovascular: RRR, no MRG Pulmonary/Chest: normal effort, CTAB, no wheezes, rales, or rhonchi Abdominal: Soft. Non-tender, non-distended, bowel sounds are normal Extremities: trace-1+ pitting edema bilaterally  Neurological: A&O x3, cranial nerve II-XII are grossly intact, moving all extremities Skin: Warm, dry and intact. No rash.     Assessment/Plan:  AKI- Baseline creatinine ~0.7.  Unclear etiology, doubt prerenal given no response to IVF, no obstruction.  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  UA reveals proteinuria, large hgb.  Sediment with granular cast (waxy cast), 7-10 wbcs and 11-20 rbcs.  Renal US shows abnormal bilateral renal echogenicity favoring chronic medical renal disease without hydronephrosis.  However, labs do not support CKD.  No recent exposure  to IV contrast,abx, hematuria, hematochezia, melena, no FH of kidney disease.  No CHF, cirrhosis.  No rash.  Appears euvolemic.  Will discontinue possible offending medications including bactrim and PPI.  PPI known to cause AIN and bactrim may also elevate Cr.  Pt with hyperkalemia and metabolic acidosis.   -d/c NS -renal biopsy hopefully tomorrow  -d/c bactrim, PPI   -ANA, ANCA, anti-DNAse B, ASO, HBSAg, Hep C Ab, HIV -strict I/O, daily weights -renal function panel to monitor electolytes -sodium bicarb 1367m bid to correct acidosis  -avoid high potassium foods -renal diet   Metabolic abnormalities-  -plans per above    Anemia-  Stable, no active bleeding  Multiple myeloma- -mgmt per Dr. MAmedeo Kinsman MD Internal Medicine Teaching Service, PGY-2 10/25/2013, 1:16 PM

## 2013-10-25 NOTE — Progress Notes (Signed)
Utilization review completed.  

## 2013-10-25 NOTE — Progress Notes (Signed)
Nutrition Education Note   RD consulted for Renal Education. Provided Chronic Kidney Disease information to patient and her husband. Reviewed food groups and provided written recommended serving sizes specifically determined for patient's current nutritional status.   Explained why diet restrictions are needed and provided lists of foods to limit/avoid that are high potassium, sodium, and phosphorus. Provided specific recommendations on safer alternatives of these foods. Strongly encouraged compliance of this diet.   Discussed importance of protein intake at each meal and snack. Discussed differences in protein recommendations for CKD stages vs HD.   Teach back method used. Expect good compliance.   Body mass index is 25.03 kg/(m^2). Pt meets criteria for overweight based on current BMI.   Current diet order is Regular, patient is consuming approximately >50% of meals at this time. Labs and medications reviewed. No further nutrition interventions warranted at this time. RD contact information provided. If additional nutrition issues arise, please re-consult RD.   Inda Coke MS, RD, LDN  Inpatient Registered Dietitian  Pager: [phone]  After-hours pager: [phone]

## 2013-10-25 NOTE — Progress Notes (Signed)
Laura Hester   DOB:12-06-1955   EU#:[id number]   XVQ#:[id number]  Subjective: ambulatory, comfortable, denies SOB, cough, pleurisy. Tells me she has urinated about 4 times, about "4 oz" each time. So far 750 out and at least 1500 in (125 cc/hr x 12). Husband in room   Objective: middle aged White woman in no acute distress Filed Vitals:   10/25/13 0528  BP: 117/66  Pulse: 92  Temp: 98.1 F (36.7 C)  Resp: 18    Body mass index is 29.83 kg/(m^2).  Intake/Output Summary (Last 24 hours) at 10/25/13 0832 Last data filed at 10/25/13 0528  Gross per 24 hour  Intake      0 ml  Output    750 ml  Net   -750 ml     Sclerae unicteric  Oropharynx clear  No peripheral adenopathy  Lungs clear -- no rales or rhonchi--no dullness to percussion  Heart regular rate and rhythm  Abdomen soft, obese, NT, +BS  MSK no focal spinal tenderness, no peripheral edema  Neuro nonfocal  Breast exam: deferred  CBG (last 3)  No results found for this basename: GLUCAP,  in the last 72 hours   Labs:  Lab Results  Component Value Date   WBC 5.0 10/24/2013   HGB 8.4* 10/24/2013   HCT 27.4* 10/24/2013   MCV 90.7 10/24/2013   PLT 306 10/24/2013   NEUTROABS 4.5 10/24/2013    @LASTCHEMISTRY @  Urine Studies No results found for this basename: UACOL, UAPR, USPG, UPH, UTP, UGL, UKET, UBIL, UHGB, UNIT, UROB, ULEU, UEPI, UWBC, URBC, UBAC, CAST, CRYS, UCOM, BILUA,  in the last 72 hours  Basic Metabolic Panel:  Recent Labs Lab 10/24/13 1055 10/24/13 1820 10/25/13 0424  NA 142 142 143  K 4.7 5.0 5.4*  CL  --  110 111  CO2 14* 13* 13*  GLUCOSE 121 175* 121*  BUN 44.7* 46* 51*  CREATININE 5.2 Repeated and Verified* 4.98* 5.10*  CALCIUM 8.0* 7.4* 7.1*  MG  --  1.4*  --   PHOS  --  4.8*  --    GFR The CrCl is unknown because both a height and weight (above a minimum accepted value) are required for this calculation. Liver Function Tests:  Recent Labs Lab 10/24/13 1055 10/24/13 1820  AST 17 15   ALT 22 18  ALKPHOS 54 55  BILITOT 0.25 <0.2*  PROT 7.1 7.2  ALBUMIN 2.3* 2.4*   No results found for this basename: LIPASE, AMYLASE,  in the last 168 hours No results found for this basename: AMMONIA,  in the last 168 hours Coagulation profile  Recent Labs Lab 10/24/13 2142  INR 1.30    CBC:  Recent Labs Lab 10/24/13 1054 10/24/13 1820  WBC 4.7 5.0  NEUTROABS 3.9 4.5  HGB 8.7* 8.4*  HCT 27.8* 27.4*  MCV 90.1 90.7  PLT 288 306   Cardiac Enzymes: No results found for this basename: CKTOTAL, CKMB, CKMBINDEX, TROPONINI,  in the last 168 hours BNP: No components found with this basename: POCBNP,  CBG: No results found for this basename: GLUCAP,  in the last 168 hours D-Dimer No results found for this basename: DDIMER,  in the last 72 hours Hgb A1c No results found for this basename: HGBA1C,  in the last 72 hours Lipid Profile No results found for this basename: CHOL, HDL, LDLCALC, TRIG, CHOLHDL, LDLDIRECT,  in the last 72 hours Thyroid function studies No results found for this basename: TSH, T4TOTAL, FREET3, T3FREE, THYROIDAB,  in the last 72 hours Anemia work up  Recent Labs  10/24/13 1820  VITAMINB12 585  FOLATE 12.9  RETICCTPCT 0.4   Microbiology No results found for this or any previous visit (from the past 240 hour(s)).    Studies:  US Renal  10/24/2013   CLINICAL DATA:  Acute renal failure.  Multiple myeloma.  EXAM: RENAL/URINARY TRACT ULTRASOUND COMPLETE  COMPARISON:  Multiple exams, including 01/28/2013  FINDINGS: Right Kidney:  Length: 11.1 cm. Abnormal increased echogenicity. No mass or hydronephrosis.  Left Kidney:  Length: 11.8 cm. Abnormal increased echogenicity. No mass or hydronephrosis.  Bladder:  Appears normal for degree of bladder distention.  IMPRESSION: 1. Abnormal bilateral renal echogenicity favoring chronic medical renal disease. No hydronephrosis.   Electronically Signed   By: Sherryl Barters M.D.   On: 10/24/2013 19:30     Assessment: 58 y.o. [place] woman with a history of IgA/lambda light chain myeloma, admitted with acute renal failure  (0) presented with anemia, found to have an IgA >6g, with an M-spike of 4.5 g (and a second M-spike of 0.4g), urine IFE showing IgA-lambda and lambda light chains, creatinine 0.77, calcium 10.0, albumin 3.1, and beta-2-microglobulin 5.51; with bone survey showing multiple myeloma lesions and bone marrow biopsy 09/01/2011 showing 75% myeloma cells. Cytogenetics were normal, FISH suggested a 13q- or loss of chromosome 13, and a 17p-   (1) started on bortezomib weekly 09/22/2011, dexamethasone 40 mg po weekly, lenalidomide 25 mg 14 days on and 7 days off, completed 12/08/2011.   (2) on prophylactic coumadin, discontinued as of 12/13/2011.   (3) zolendronic acid every 28 days, 09/19/2011 to 01/05/2012 (five doses)   (4) s/p neupogen mobilization October 2013, with a collection of 4.26 x10^6 CD34 positive cells   (5) status post melphalan at 200 mg/M2 on 02/23/2012, followed by autologous stem cell transplant the following day.   (6) on 06/07/2012 started maintenance bortezomib sq at 1.3 mg/M2 Q2w, with bactrim and acyclovir prophylaxis, the plan being to contuinue this for 2 years as per the HOVON trial   (7) zolendronic acid, given monthly, resumed June 2014, most recent dose 08/15/2013   (8) disease progression noted with a rise in the IgA lambda fraction September 2014   (9) treated according to the [facility] S1304 randomized to the high dose arm, receiving carfilzomib on days 1, 2, 8, 9, 15, and 16 of every 28 day cycle. Cycle 1 given at a reduced dose of 20 mg per meter square, with subsequent cycles given at 56 mg per meter square. Regimen started 02/07/2013, discontinued as of 07/04/2013 (day 8 cycle 6) due to evidence of progression.   (10 enrolled in BMS 143, receiving elotuzumab weekly, with dexamethasone 40 mg weekly (taken on any non-elituzumab day) and lenalidomide  at 25 mg/day for 21 days of each 28 day cycle, starting 08/01/2013   (a) prophylactic ASA 325 mg started with the first cycle   (b) prophylactic valacyclovir and trimethoprim-sulfamethoxazole continued as before   (c) received DTaP, Hep B and inactivated polio (Pediarix), H-flu and pneumococcal vacccines (HIb and PCV13) on 07/14/2013 at [facility]; to be repeated 09/13/2013 and October 2015   (11) ARF with creatinine 0.6 early June, 5.2 on 10/24/2012  (a) renal US shows no hydronephrosis  (b) hyperkalemia with potassium 5.4 10/25/2013   Plan: no response to hydration so far. She is making clear urine, but I/O not well documented. So far no clinical evidence of volume overload and no indication for HD but rising K+  is a concern. Await renal's recommendations re. lasix/ HD/ renal Bx or other workup or interventions. Have placed nutrition consult re renal diet. Will recheck labs early this afternoon. Pretty much off all meds at this point. Discussed with patient and her husband.   Chauncey Cruel, MD 10/25/2013  8:32 AM

## 2013-10-26 ENCOUNTER — Inpatient Hospital Stay (HOSPITAL_COMMUNITY): Payer: BC Managed Care – PPO

## 2013-10-26 ENCOUNTER — Other Ambulatory Visit: Payer: Self-pay | Admitting: Oncology

## 2013-10-26 DIAGNOSIS — N178 Other acute kidney failure: Secondary | ICD-10-CM

## 2013-10-26 DIAGNOSIS — E876 Hypokalemia: Secondary | ICD-10-CM

## 2013-10-26 LAB — CBC WITH DIFFERENTIAL/PLATELET
BASOS ABS: 0.1 10*3/uL (ref 0.0–0.1)
Basophils Relative: 2 % — ABNORMAL HIGH (ref 0–1)
EOS ABS: 0 10*3/uL (ref 0.0–0.7)
Eosinophils Relative: 0 % (ref 0–5)
HCT: 23.5 % — ABNORMAL LOW (ref 36.0–46.0)
Hemoglobin: 7.1 g/dL — ABNORMAL LOW (ref 12.0–15.0)
Lymphocytes Relative: 15 % (ref 12–46)
Lymphs Abs: 0.8 10*3/uL (ref 0.7–4.0)
MCH: 27.8 pg (ref 26.0–34.0)
MCHC: 30.2 g/dL (ref 30.0–36.0)
MCV: 92.2 fL (ref 78.0–100.0)
MONO ABS: 1.2 10*3/uL — AB (ref 0.1–1.0)
Monocytes Relative: 21 % — ABNORMAL HIGH (ref 3–12)
NEUTROS PCT: 62 % (ref 43–77)
Neutro Abs: 3.4 10*3/uL (ref 1.7–7.7)
PLATELETS: 266 10*3/uL (ref 150–400)
RBC: 2.55 MIL/uL — ABNORMAL LOW (ref 3.87–5.11)
RDW: 18.7 % — AB (ref 11.5–15.5)
WBC: 5.5 10*3/uL (ref 4.0–10.5)

## 2013-10-26 LAB — ANCA SCREEN W REFLEX TITER
ATYPICAL P-ANCA SCREEN: NEGATIVE
C-ANCA SCREEN: NEGATIVE
p-ANCA Screen: NEGATIVE

## 2013-10-26 LAB — MPO/PR-3 (ANCA) ANTIBODIES
Myeloperoxidase Abs: <1
Serine Protease 3: <1

## 2013-10-26 LAB — BASIC METABOLIC PANEL
Anion gap: 21 — ABNORMAL HIGH (ref 5–15)
BUN: 56 mg/dL — ABNORMAL HIGH (ref 6–23)
CO2: 13 mEq/L — ABNORMAL LOW (ref 19–32)
CREATININE: 5.47 mg/dL — AB (ref 0.50–1.10)
Calcium: 7.4 mg/dL — ABNORMAL LOW (ref 8.4–10.5)
Chloride: 111 mEq/L (ref 96–112)
GFR, EST AFRICAN AMERICAN: 9 mL/min — AB (ref >90)
GFR, EST NON AFRICAN AMERICAN: 8 mL/min — AB (ref >90)
Glucose, Bld: 87 mg/dL (ref 70–99)
POTASSIUM: 4.7 meq/L (ref 3.7–5.3)
Sodium: 145 mEq/L (ref 137–147)

## 2013-10-26 LAB — PREPARE RBC (CROSSMATCH)

## 2013-10-26 LAB — ANTISTREPTOLYSIN O TITER

## 2013-10-26 LAB — ANA: ANA: NEGATIVE

## 2013-10-26 LAB — CREATININE, SERUM: CREATININE: 1.64 mg/dL — AB (ref 0.50–1.10)

## 2013-10-26 MED ORDER — SODIUM CHLORIDE 0.9 % IJ SOLN: 10.0000 mL | INTRAMUSCULAR | Status: DC | PRN

## 2013-10-26 MED ORDER — HEPARIN SOD (PORK) LOCK FLUSH 100 UNIT/ML IV SOLN
250.0000 [IU] | INTRAVENOUS | Status: DC | PRN
  Filled 2013-10-26: qty 3

## 2013-10-26 MED ORDER — FENTANYL CITRATE 0.05 MG/ML IJ SOLN
INTRAMUSCULAR | Status: AC
  Filled 2013-10-26: qty 2

## 2013-10-26 MED ORDER — HEPARIN SOD (PORK) LOCK FLUSH 100 UNIT/ML IV SOLN
500.0000 [IU] | Freq: Every day | INTRAVENOUS | Status: DC | PRN
  Filled 2013-10-26: qty 5

## 2013-10-26 MED ORDER — MIDAZOLAM HCL 2 MG/2ML IJ SOLN
INTRAMUSCULAR | Status: AC | PRN
  Administered 2013-10-26: 2 mg via INTRAVENOUS
  Administered 2013-10-26: 1 mg via INTRAVENOUS

## 2013-10-26 MED ORDER — MIDAZOLAM HCL 2 MG/2ML IJ SOLN
INTRAMUSCULAR | Status: AC
  Filled 2013-10-26: qty 4

## 2013-10-26 MED ORDER — FENTANYL CITRATE 0.05 MG/ML IJ SOLN
INTRAMUSCULAR | Status: AC | PRN
  Administered 2013-10-26: 50 ug via INTRAVENOUS
  Administered 2013-10-26: 25 ug via INTRAVENOUS

## 2013-10-26 MED ORDER — SODIUM CHLORIDE 0.9 % IV SOLN
250.0000 mL | Freq: Once | INTRAVENOUS | Status: AC
  Administered 2013-10-26: 250 mL via INTRAVENOUS

## 2013-10-26 MED ORDER — SODIUM CHLORIDE 0.9 % IJ SOLN: 3.0000 mL | INTRAMUSCULAR | Status: DC | PRN

## 2013-10-26 NOTE — Progress Notes (Signed)
I saw the patient and agree with the above assessment and plan.     Pt clinically stable.  Agree w/ 2u PRBC today.  For Renal Bx.  Results unlikely, even in prelim form, until Friday.  If stable post biopsy could potentially d/c w/ close f/u as outpt tomorrow.  Serologies, negative, noted.

## 2013-10-26 NOTE — Procedures (Addendum)
Procedure:  Renal core biopsy Findings:  Random core biopsy of LP of RIGHT kidney.  3 passes with 16 G core device.  No immediate complications.

## 2013-10-26 NOTE — Progress Notes (Signed)
Patient ID: Laura Hester female   DOB: [birth year] 58 y.o.   MRN: [id number]  S: VSS.  Pt is doing well this AM.  Denies any CP, SOB, N/V/D/C. UOP in the past 24hrs: 969m. Wts: 163-->168. Cr continues to rise 5.09-->5.47 today, hgb 7.1.  Report she may be getting a UTI-noted a small amt of blood in urine this AM.   O:  Vital signs: BP 127/75  Pulse 88  Temp(Src) 98.9 F (37.2 C) (Oral)  Resp 18  Wt 168 lb 14 oz (76.6 kg)  SpO2 97%  LMP 04/20/2011  Intake/Output:  Intake/Output Summary (Last 24 hours) at 10/26/13 0933 Last data filed at 10/26/13 0027  Gross per 24 hour  Intake      0 ml  Output    700 ml  Net   -700 ml    Weight change: Filed Weights   10/24/13 1940 10/25/13 1900  Weight: 163 lb 2.3 oz (74 kg) 168 lb 14 oz (76.6 kg)    Physical Exam: Gen:NAD, sitting on edge of the bed CVS:RRR Resp:CTAB Abd:+BS, NT/ND Neuro: A&O x3, moving all extremties  EQDI:YMEBR=8+LE edema bilaterally L>R, no UE edema  Renal function panel:  Recent Labs Lab 10/24/13 1055 10/24/13 1820 10/25/13 0424 10/25/13 1310 10/26/13 0505  NA 142 142 143 143 145  K 4.7 5.0 5.4* 4.6 4.7  CL  --  110 111 109 111  CO2 14* 13* 13* 13* 13*  GLUCOSE 121 175* 121* 97 87  BUN 44.7* 46* 51* 53* 56*  CREATININE 5.2 Repeated and Verified* 4.98* 5.10* 5.09* 5.47*  ALBUMIN 2.3* 2.4*  --  2.3*  --   CALCIUM 8.0* 7.4* 7.1* 7.0* 7.4*  PHOS  --  4.8*  --   --   --   AST 17 15  --  15  --   ALT 22 18  --  16  --     Liver function tests:  Recent Labs Lab 10/24/13 1055 10/24/13 1820 10/25/13 1310  AST 17 15 15   ALT 22 18 16   ALKPHOS 54 55 48  BILITOT 0.25 <0.2* <0.2*  PROT 7.1 7.2 6.6  ALBUMIN 2.3* 2.4* 2.3*   No results found for this basename: LIPASE, AMYLASE,  in the last 168 hours No results found for this basename: AMMONIA,  in the last 168 hours  CBC:  Recent Labs Lab 10/24/13 1054 10/24/13 1820 10/25/13 1310 10/26/13 0505  WBC 4.7 5.0 7.0 5.5  NEUTROABS 3.9 4.5 4.6  3.4  HGB 8.7* 8.4* 7.5* 7.1*  HCT 27.8* 27.4* 24.5* 23.5*  MCV 90.1 90.7 92.5 92.2  PLT 288 306 282 266    Cardiac Enzymes: No results found for this basename: CKTOTAL, CKMB, CKMBINDEX, TROPONINI,  in the last 168 hours  CBG: No results found for this basename: GLUCAP,  in the last 168 hours  Iron Studies:  No results found for this basename: IRON, TIBC, TRANSFERRIN, FERRITIN,  in the last 72 hours  Studies/Results: UKoreaRenal  10/24/2013   CLINICAL DATA:  Acute renal failure.  Multiple myeloma.  EXAM: RENAL/URINARY TRACT ULTRASOUND COMPLETE  COMPARISON:  Multiple exams, including 01/28/2013  FINDINGS: Right Kidney:  Length: 11.1 cm. Abnormal increased echogenicity. No mass or hydronephrosis.  Left Kidney:  Length: 11.8 cm. Abnormal increased echogenicity. No mass or hydronephrosis.  Bladder:  Appears normal for degree of bladder distention.  IMPRESSION: 1. Abnormal bilateral renal echogenicity favoring chronic medical renal disease. No hydronephrosis.   Electronically Signed  By: Sherryl Barters M.D.   On: 10/24/2013 19:30   . sodium chloride  250 mL Intravenous Once  . dexamethasone  40 mg Oral Weekly  . sodium bicarbonate  1,300 mg Oral BID    BMET:    Component Value Date/Time   NA 145 10/26/2013 0505   NA 142 10/24/2013 1055   K 4.7 10/26/2013 0505   K 4.7 10/24/2013 1055   CL 111 10/26/2013 0505   CL 107 09/27/2012 0842   CO2 13* 10/26/2013 0505   CO2 14* 10/24/2013 1055   GLUCOSE 87 10/26/2013 0505   GLUCOSE 121 10/24/2013 1055   GLUCOSE 91 09/27/2012 0842   BUN 56* 10/26/2013 0505   BUN 44.7* 10/24/2013 1055   CREATININE 5.47* 10/26/2013 0505   CREATININE 5.2 Repeated and Verified* 10/24/2013 1055   CALCIUM 7.4* 10/26/2013 0505   CALCIUM 8.0* 10/24/2013 1055   GFRNONAA 8* 10/26/2013 0505   GFRAA 9* 10/26/2013 0505    CBC:    Component Value Date/Time   WBC 5.5 10/26/2013 0505   WBC 4.7 10/24/2013 1054   RBC 2.55* 10/26/2013 0505   RBC 3.02* 10/24/2013 1820   RBC 3.08*  10/24/2013 1054   HGB 7.1* 10/26/2013 0505   HGB 8.7* 10/24/2013 1054   HCT 23.5* 10/26/2013 0505   HCT 27.8* 10/24/2013 1054   PLT 266 10/26/2013 0505   PLT 288 10/24/2013 1054   MCV 92.2 10/26/2013 0505   MCV 90.1 10/24/2013 1054   MCH 27.8 10/26/2013 0505   MCH 28.1 10/24/2013 1054   MCHC 30.2 10/26/2013 0505   MCHC 31.2* 10/24/2013 1054   RDW 18.7* 10/26/2013 0505   RDW 19.7* 10/24/2013 1054   LYMPHSABS 0.8 10/26/2013 0505   LYMPHSABS 0.3* 10/24/2013 1054   MONOABS 1.2* 10/26/2013 0505   MONOABS 0.3 10/24/2013 1054   EOSABS 0.0 10/26/2013 0505   EOSABS 0.0 10/24/2013 1054   BASOSABS 0.1 10/26/2013 0505   BASOSABS 0.1 10/24/2013 1054     Assessment/Plan:  AKI-  Baseline creatinine ~0.7. Cr continues to rise today (5.47).  Appears euvolemic. ASO neg, ANA neg, HBsAg neg, HepC neg, HIV neg.  Of note, pt had zoledronic acid on 6/29 which is a possibility for her acute kidney injury given her risk factor of MM.  Case reports for AKI d/t zoledronic acid were more prevalent in NSAID users and pt with MM or RCC.  -renal biopsy today  -ANCA, anti-DNAse B pending -strict I/O, daily weights  -renal function panel to monitor electolytes  -sodium bicarb 1322m bid to correct acidosis  -avoid high potassium foods  -renal diet   Metabolic abnormalities-  -plans per above   Lower extremity swelling L>R- Pt has no erythema, warmth, or pain.   -consider LE venous dopplers if worsening   Anemia-  Hgb 7.1 today, no active bleeding.  -transfuse 2 unit prbcs -monitor, would transfuse hgb<7  Multiple myeloma-  -mgmt per Dr. MAmedeo Kinsman MD Internal Medicine Teaching Service, PGY-2

## 2013-10-26 NOTE — Progress Notes (Signed)
Laura Hester   DOB:[birth year]   CM#:[id number]   AVW#:[id number]  Subjective: ambulatory, comfortable, denies SOB, cough, pleurisy. Had normal BM. Making urine. C/o mild dysuria, no hematuria or frequency. Husband not in room (he is in [place] today)   Objective: middle aged White woman who appears well  Filed Vitals:   10/26/13 0534  BP: 127/75  Pulse: 88  Temp: 98.9 F (37.2 C)  Resp: 18    Body mass index is 30.88 kg/(m^2).  Intake/Output Summary (Last 24 hours) at 10/26/13 0843 Last data filed at 10/26/13 0027  Gross per 24 hour  Intake      0 ml  Output    925 ml  Net   -925 ml     Sclerae unicteric  Oropharynx clear  No peripheral adenopathy  Lungs clear -- no rales or rhonchi--no dullness to percussion  Heart regular rate and rhythm  Abdomen soft, obese, NT, +BS  MSK no focal spinal tenderness, no peripheral edema  Neuro nonfocal, well oriented, positive affect  Breast exam: deferred  CBG (last 3)  No results found for this basename: GLUCAP,  in the last 72 hours   Labs:  Lab Results  Component Value Date   WBC 5.5 10/26/2013   HGB 7.1* 10/26/2013   HCT 23.5* 10/26/2013   MCV 92.2 10/26/2013   PLT 266 10/26/2013   NEUTROABS 3.4 10/26/2013    @LASTCHEMISTRY @  Urine Studies No results found for this basename: UACOL, UAPR, USPG, UPH, UTP, UGL, UKET, UBIL, UHGB, UNIT, UROB, ULEU, UEPI, UWBC, URBC, UBAC, CAST, CRYS, UCOM, BILUA,  in the last 72 hours  Basic Metabolic Panel:  Recent Labs Lab 10/24/13 1055  10/24/13 1820 10/25/13 0424 10/25/13 1310 10/26/13 0505  NA 142  --  142 143 143 145  K 4.7  < > 5.0 5.4* 4.6 4.7  CL  --   --  110 111 109 111  CO2 14*  --  13* 13* 13* 13*  GLUCOSE 121  --  175* 121* 97 87  BUN 44.7*  --  46* 51* 53* 56*  CREATININE 5.2 Repeated and Verified*  --  4.98* 5.10* 5.09* 5.47*  CALCIUM 8.0*  --  7.4* 7.1* 7.0* 7.4*  MG  --   --  1.4*  --   --   --   PHOS  --   --  4.8*  --   --   --   < > = values in this interval  not displayed. GFR The CrCl is unknown because both a height and weight (above a minimum accepted value) are required for this calculation. Liver Function Tests:  Recent Labs Lab 10/24/13 1055 10/24/13 1820 10/25/13 1310  AST 17 15 15   ALT 22 18 16   ALKPHOS 54 55 48  BILITOT 0.25 <0.2* <0.2*  PROT 7.1 7.2 6.6  ALBUMIN 2.3* 2.4* 2.3*   No results found for this basename: LIPASE, AMYLASE,  in the last 168 hours No results found for this basename: AMMONIA,  in the last 168 hours Coagulation profile  Recent Labs Lab 10/24/13 2142  INR 1.30    CBC:  Recent Labs Lab 10/24/13 1054 10/24/13 1820 10/25/13 1310 10/26/13 0505  WBC 4.7 5.0 7.0 5.5  NEUTROABS 3.9 4.5 4.6 3.4  HGB 8.7* 8.4* 7.5* 7.1*  HCT 27.8* 27.4* 24.5* 23.5*  MCV 90.1 90.7 92.5 92.2  PLT 288 306 282 266   Cardiac Enzymes: No results found for this basename: CKTOTAL, CKMB, CKMBINDEX, TROPONINI,  in  the last 168 hours BNP: No components found with this basename: POCBNP,  CBG: No results found for this basename: GLUCAP,  in the last 168 hours D-Dimer No results found for this basename: DDIMER,  in the last 72 hours Hgb A1c No results found for this basename: HGBA1C,  in the last 72 hours Lipid Profile No results found for this basename: CHOL, HDL, LDLCALC, TRIG, CHOLHDL, LDLDIRECT,  in the last 72 hours Thyroid function studies No results found for this basename: TSH, T4TOTAL, FREET3, T3FREE, THYROIDAB,  in the last 72 hours Anemia work up  Recent Labs  10/24/13 1820  VITAMINB12 585  FOLATE 12.9  RETICCTPCT 0.4   Microbiology Recent Results (from the past 240 hour(s))  URINE CULTURE     Status: None   Collection Time    10/24/13  5:30 PM      Result Value Ref Range Status   Specimen Description URINE, RANDOM   Final   Special Requests PATIENT ON FOLLOWING SEPTRA   Final   Culture  Setup Time     Final   Value: 10/24/2013 18:34     Performed at [place]      Final   Value: NO GROWTH     Performed at [facility]   Culture     Final   Value: NO GROWTH     Performed at [facility]   Report Status 10/25/2013 FINAL   Final      Studies:  US Renal  10/24/2013   CLINICAL DATA:  Acute renal failure.  Multiple myeloma.  EXAM: RENAL/URINARY TRACT ULTRASOUND COMPLETE  COMPARISON:  Multiple exams, including 01/28/2013  FINDINGS: Right Kidney:  Length: 11.1 cm. Abnormal increased echogenicity. No mass or hydronephrosis.  Left Kidney:  Length: 11.8 cm. Abnormal increased echogenicity. No mass or hydronephrosis.  Bladder:  Appears normal for degree of bladder distention.  IMPRESSION: 1. Abnormal bilateral renal echogenicity favoring chronic medical renal disease. No hydronephrosis.   Electronically Signed   By: Sherryl Barters M.D.   On: 10/24/2013 19:30    Assessment: 58 y.o. [place] woman with a history of IgA/lambda light chain myeloma, admitted with acute renal failure  (0) presented with anemia, found to have an IgA >6g, with an M-spike of 4.5 g (and a second M-spike of 0.4g), urine IFE showing IgA-lambda and lambda light chains, creatinine 0.77, calcium 10.0, albumin 3.1, and beta-2-microglobulin 5.51; with bone survey showing multiple myeloma lesions and bone marrow biopsy 09/01/2011 showing 75% myeloma cells. Cytogenetics were normal, FISH suggested a 13q- or loss of chromosome 13, and a 17p-   (1) started on bortezomib weekly 09/22/2011, dexamethasone 40 mg po weekly, lenalidomide 25 mg 14 days on and 7 days off, completed 12/08/2011.   (2) on prophylactic coumadin, discontinued as of 12/13/2011.   (3) zolendronic acid every 28 days, 09/19/2011 to 01/05/2012 (five doses)   (4) s/p neupogen mobilization October 2013, with a collection of 4.26 x10^6 CD34 positive cells   (5) status post melphalan at 200 mg/M2 on 02/23/2012, followed by autologous stem cell transplant the following day.   (6) on 06/07/2012 started maintenance  bortezomib sq at 1.3 mg/M2 Q2w, with bactrim and acyclovir prophylaxis, the plan being to contuinue this for 2 years as per the HOVON trial   (7) zolendronic acid, given monthly, resumed June 2014, most recent dose 08/15/2013   (8) disease progression noted with a rise in the IgA lambda fraction September 2014   (  9) treated according to the [facility] S1304 randomized to the high dose arm, receiving carfilzomib on days 1, 2, 8, 9, 15, and 16 of every 28 day cycle. Cycle 1 given at a reduced dose of 20 mg per meter square, with subsequent cycles given at 56 mg per meter square. Regimen started 02/07/2013, discontinued as of 07/04/2013 (day 8 cycle 6) due to evidence of progression.   (10 enrolled in BMS 143, receiving elotuzumab weekly, with dexamethasone 40 mg weekly (taken on any non-elituzumab day) and lenalidomide at 25 mg/day for 21 days of each 28 day cycle, starting 08/01/2013   (a) prophylactic ASA 325 mg started with the first cycle   (b) prophylactic valacyclovir and trimethoprim-sulfamethoxazole continued as before   (c) received DTaP, Hep B and inactivated polio (Pediarix), H-flu and pneumococcal vacccines (HIb and PCV13) on 07/14/2013 at [facility]; to be repeated 09/13/2013 and October 2015   (11) ARF with creatinine 0.6 early June, 5.2 on 10/24/2012  (a) renal US shows no hydronephrosis  (b) hyperkalemia with potassium 5.4 10/25/2013-- improved  (c) most recent zolendronate dose 10/10/2013--creatinine was not obtained pre dose  (d) renal biopsy pending 10/26/2013  (12) anemia requiring transfusion-- will transfuse today, will start epo supplementation, check ferritin in AM   Plan: .Reagyn is clinically stable, but creat continues to rise. Among her meds zolendronate is a possible cause of ARF and we did not obtain a creatinine with the most recent treatment. Results of today's biopsy may not be ready for several days, and question is whether patient needs to stay in hospital until final  results available or can we discharged her home, possibly tomorrow, with follow up with renal and at the cancer center next week--will inquire of Dr Joelyn Oms.  Will transfuse today and check a urine CX. Discussed with patient.  Chauncey Cruel, MD 10/26/2013  8:43 AM

## 2013-10-26 NOTE — Progress Notes (Signed)
Patient is alert and hemodynamically stable.  No c/o pain or any discomfort noted.  No bleeding noted from the renal biopsy site.

## 2013-10-27 ENCOUNTER — Other Ambulatory Visit: Payer: Self-pay | Admitting: Oncology

## 2013-10-27 ENCOUNTER — Telehealth: Payer: Self-pay | Admitting: Oncology

## 2013-10-27 DIAGNOSIS — C9002 Multiple myeloma in relapse: Secondary | ICD-10-CM

## 2013-10-27 LAB — CBC WITH DIFFERENTIAL/PLATELET
BAND NEUTROPHILS: 0 % (ref 0–10)
BLASTS: 0 %
Basophils Absolute: 0 10*3/uL (ref 0.0–0.1)
Basophils Relative: 1 % (ref 0–1)
Eosinophils Absolute: 0 10*3/uL (ref 0.0–0.7)
Eosinophils Relative: 1 % (ref 0–5)
HEMATOCRIT: 27.7 % — AB (ref 36.0–46.0)
Hemoglobin: 8.7 g/dL — ABNORMAL LOW (ref 12.0–15.0)
LYMPHS ABS: 0.4 10*3/uL — AB (ref 0.7–4.0)
LYMPHS PCT: 9 % — AB (ref 12–46)
MCH: 28.5 pg (ref 26.0–34.0)
MCHC: 31.4 g/dL (ref 30.0–36.0)
MCV: 90.8 fL (ref 78.0–100.0)
Metamyelocytes Relative: 0 %
Monocytes Absolute: 0.6 10*3/uL (ref 0.1–1.0)
Monocytes Relative: 13 % — ABNORMAL HIGH (ref 3–12)
Myelocytes: 0 %
NEUTROS PCT: 76 % (ref 43–77)
NRBC: 0 /100{WBCs}
Neutro Abs: 3.3 10*3/uL (ref 1.7–7.7)
PLATELETS: 207 10*3/uL (ref 150–400)
PROMYELOCYTES ABS: 0 %
RBC: 3.05 MIL/uL — ABNORMAL LOW (ref 3.87–5.11)
RDW: 18.4 % — AB (ref 11.5–15.5)
WBC: 4.3 10*3/uL (ref 4.0–10.5)

## 2013-10-27 LAB — BASIC METABOLIC PANEL
ANION GAP: 21 — AB (ref 5–15)
BUN: 55 mg/dL — ABNORMAL HIGH (ref 6–23)
CALCIUM: 7.3 mg/dL — AB (ref 8.4–10.5)
CO2: 15 meq/L — AB (ref 19–32)
Chloride: 114 mEq/L — ABNORMAL HIGH (ref 96–112)
Creatinine, Ser: 5.88 mg/dL — ABNORMAL HIGH (ref 0.50–1.10)
GFR calc non Af Amer: 7 mL/min — ABNORMAL LOW (ref >90)
GFR, EST AFRICAN AMERICAN: 8 mL/min — AB (ref >90)
Glucose, Bld: 87 mg/dL (ref 70–99)
Potassium: 4.3 mEq/L (ref 3.7–5.3)
SODIUM: 150 meq/L — AB (ref 137–147)

## 2013-10-27 LAB — TYPE AND SCREEN
ABO/RH(D): A POS
ANTIBODY SCREEN: NEGATIVE
UNIT DIVISION: 0
Unit division: 0

## 2013-10-27 LAB — URINE CULTURE
Colony Count: NO GROWTH
Culture: NO GROWTH

## 2013-10-27 LAB — RETICULOCYTES
RBC.: 3.05 MIL/uL — AB (ref 3.87–5.11)
RETIC COUNT ABSOLUTE: 15.3 10*3/uL — AB (ref 19.0–186.0)
Retic Ct Pct: 0.5 % (ref 0.4–3.1)

## 2013-10-27 LAB — FERRITIN: Ferritin: 417 ng/mL — ABNORMAL HIGH (ref 10–291)

## 2013-10-27 MED ORDER — SODIUM BICARBONATE 650 MG PO TABS: 1300.0000 mg | ORAL_TABLET | Freq: Two times a day (BID) | ORAL | Status: DC

## 2013-10-27 MED ORDER — HEPARIN SOD (PORK) LOCK FLUSH 100 UNIT/ML IV SOLN
500.0000 [IU] | INTRAVENOUS | Status: AC | PRN
  Administered 2013-10-27: 500 [IU]

## 2013-10-27 NOTE — Progress Notes (Signed)
I saw the patient and agree with the above assessment and plan.    Stable post Bx.  Feels well.  Will have labs checked at [facility] tomorrow and arrange f/u app with me on Monday.

## 2013-10-27 NOTE — Telephone Encounter (Signed)
spoke with Laura Hester re lab and follow up 10/31/13. Per [name] she is currently waiting to here back from Dr. Philipp Ovens to be admitted into the hospital for 4 days and she will not be available 7/20. per pt she will be admitted to the multiple myeloma clinic. appt for 7/20 cxd. message to GM.

## 2013-10-27 NOTE — Progress Notes (Signed)
Patient ID: Laura Hester female   DOB: 1955-09-20 58 y.o.   MRN: [id number]  S: VSS.  Pt is doing well this AM s/p R renal biopsy.  Denies any CP, SOB, N/V/D/C. UOP in the past 24hrs: 959m.  SCr continues to rise 5.47-->5.88 today, hgb 8.7 s/p 2 units of prbcs.  Denies any hematuria or pain at the biopsy site.   O:  Vital signs: BP 115/79  Pulse 84  Temp(Src) 98.5 F (36.9 C) (Oral)  Resp 18  Wt 168 lb 14 oz (76.6 kg)  SpO2 97%  LMP 04/20/2011  Intake/Output:  Intake/Output Summary (Last 24 hours) at 10/27/13 1132 Last data filed at 10/27/13 1006  Gross per 24 hour  Intake    505 ml  Output    900 ml  Net   -395 ml    Weight change: Filed Weights   10/24/13 1940 10/25/13 1900  Weight: 163 lb 2.3 oz (74 kg) 168 lb 14 oz (76.6 kg)    Physical Exam: Gen:NAD, sitting on edge of the bed CVS:RRR Resp:CTAB Abd:+BS, NT/ND Neuro: A&O x3, moving all extremties  EHWT:UUEKC=0+LE edema bilaterally L>R, no UE edema  Renal function panel:  Recent Labs Lab 10/24/13 1055 10/24/13 1820 10/25/13 0424 10/25/13 1310 10/26/13 0505 10/27/13 0457  NA 142 142 143 143 145 150*  K 4.7 5.0 5.4* 4.6 4.7 4.3  CL  --  110 111 109 111 114*  CO2 14* 13* 13* 13* 13* 15*  GLUCOSE 121 175* 121* 97 87 87  BUN 44.7* 46* 51* 53* 56* 55*  CREATININE 5.2 Repeated and Verified* 4.98* 5.10* 5.09* 5.47* 5.88*  ALBUMIN 2.3* 2.4*  --  2.3*  --   --   CALCIUM 8.0* 7.4* 7.1* 7.0* 7.4* 7.3*  PHOS  --  4.8*  --   --   --   --   AST 17 15  --  15  --   --   ALT 22 18  --  16  --   --     Liver function tests:  Recent Labs Lab 10/24/13 1055 10/24/13 1820 10/25/13 1310  AST 17 15 15   ALT 22 18 16   ALKPHOS 54 55 48  BILITOT 0.25 <0.2* <0.2*  PROT 7.1 7.2 6.6  ALBUMIN 2.3* 2.4* 2.3*   No results found for this basename: LIPASE, AMYLASE,  in the last 168 hours No results found for this basename: AMMONIA,  in the last 168 hours  CBC:  Recent Labs Lab 10/24/13 1054  10/24/13 1820  10/25/13 1310 10/26/13 0505 10/27/13 0457  WBC 4.7  < > 5.0 7.0 5.5 4.3  NEUTROABS 3.9  < > 4.5 4.6 3.4 3.3  HGB 8.7*  < > 8.4* 7.5* 7.1* 8.7*  HCT 27.8*  < > 27.4* 24.5* 23.5* 27.7*  MCV 90.1  --  90.7 92.5 92.2 90.8  PLT 288  < > 306 282 266 207  < > = values in this interval not displayed.  Cardiac Enzymes: No results found for this basename: CKTOTAL, CKMB, CKMBINDEX, TROPONINI,  in the last 168 hours  CBG: No results found for this basename: GLUCAP,  in the last 168 hours  Iron Studies:   Recent Labs  10/27/13 0457  FERRITIN 417*    Studies/Results: UKoreaBiopsy  10/26/2013   CLINICAL DATA:  Acute renal failure and history of multiple myeloma.  EXAM: ULTRASOUND GUIDED CORE BIOPSY OF RIGHT KIDNEY  MEDICATIONS: 3.0 mg IV Versed; 75 mcg IV Fentanyl  Total Moderate Sedation Time: 15 min  PROCEDURE: The procedure, risks, benefits, and alternatives were explained to the patient. Questions regarding the procedure were encouraged and answered. The patient understands and consents to the procedure. A time-out procedure was performed.  The right flank region was prepped with Betadine in a sterile fashion, and a sterile drape was applied covering the operative field. A sterile gown and sterile gloves were used for the procedure. Local anesthesia was provided with 1% Lidocaine.  Ultrasound was used to localize both kidneys. After choosing the right kidney for biopsy, under direct ultrasound guidance, 3 passes with a 16 gauge core device were performed through lower pole renal cortex. Material was submitted in saline. Additional post biopsy imaging was performed with ultrasound.  COMPLICATIONS: None.  FINDINGS: Both kidneys are well visualized by ultrasound. The lower pole cortex of the right kidney appeared more substantial than the left. The first pass yielded only a small fragment of cortex. Two additional passes yielded more substantial tissue.  IMPRESSION: Ultrasound-guided core biopsy performed  of the lower pole of the right kidney.   Electronically Signed   By: Aletta Edouard M.D.   On: 10/26/2013 14:02   . dexamethasone  40 mg Oral Weekly  . sodium bicarbonate  1,300 mg Oral BID    BMET:    Component Value Date/Time   NA 150* 10/27/2013 0457   NA 142 10/24/2013 1055   K 4.3 10/27/2013 0457   K 4.7 10/24/2013 1055   CL 114* 10/27/2013 0457   CL 107 09/27/2012 0842   CO2 15* 10/27/2013 0457   CO2 14* 10/24/2013 1055   GLUCOSE 87 10/27/2013 0457   GLUCOSE 121 10/24/2013 1055   GLUCOSE 91 09/27/2012 0842   BUN 55* 10/27/2013 0457   BUN 44.7* 10/24/2013 1055   CREATININE 5.88* 10/27/2013 0457   CREATININE 5.2 Repeated and Verified* 10/24/2013 1055   CALCIUM 7.3* 10/27/2013 0457   CALCIUM 8.0* 10/24/2013 1055   GFRNONAA 7* 10/27/2013 0457   GFRAA 8* 10/27/2013 0457    CBC:    Component Value Date/Time   WBC 4.3 10/27/2013 0457   WBC 4.7 10/24/2013 1054   RBC 3.05* 10/27/2013 0457   RBC 3.05* 10/27/2013 0457   RBC 3.08* 10/24/2013 1054   HGB 8.7* 10/27/2013 0457   HGB 8.7* 10/24/2013 1054   HCT 27.7* 10/27/2013 0457   HCT 27.8* 10/24/2013 1054   PLT 207 10/27/2013 0457   PLT 288 10/24/2013 1054   MCV 90.8 10/27/2013 0457   MCV 90.1 10/24/2013 1054   MCH 28.5 10/27/2013 0457   MCH 28.1 10/24/2013 1054   MCHC 31.4 10/27/2013 0457   MCHC 31.2* 10/24/2013 1054   RDW 18.4* 10/27/2013 0457   RDW 19.7* 10/24/2013 1054   LYMPHSABS 0.4* 10/27/2013 0457   LYMPHSABS 0.3* 10/24/2013 1054   MONOABS 0.6 10/27/2013 0457   MONOABS 0.3 10/24/2013 1054   EOSABS 0.0 10/27/2013 0457   EOSABS 0.0 10/24/2013 1054   BASOSABS 0.0 10/27/2013 0457   BASOSABS 0.1 10/24/2013 1054     Assessment/Plan:  AKI-  Baseline creatinine ~0.7. Cr continues to rise today (5.88).  Appears euvolemic. ASO neg, ANA neg, ANCA neg, HBsAg neg, HepC neg, HIV neg.  Of note, pt had zoledronic acid on 6/29 which is a possibility for her acute kidney injury given her risk factor of MM.  Case reports for AKI d/t zoledronic acid were more  prevalent in NSAID users and pt with MM or RCC. -pt stable for d/c  today with labs done tomorrow at [facility] and appt with Dr. Joelyn Oms on Monday afternoon; pt given instructions to call/email if any concerns -anti-DNAse B pending -strict I/O, daily weights  -renal function panel to monitor electolytes  -consider increasing sodium bicarb 1368m tid  -avoid high potassium foods  -renal diet   Metabolic abnormalities-  -plans per above   Lower extremity swelling L>R- Pt has no erythema, warmth, or pain.   -consider LE venous dopplers if worsening   Anemia-  Hgb 8.7 today, s/p 2 units of prbcs, no active bleeding.  -monitor, would transfuse hgb<7  Multiple myeloma-  -mgmt per Dr. MAmedeo Kinsman MD Internal Medicine Teaching Service, PGY-2

## 2013-10-27 NOTE — Discharge Summary (Signed)
Physician Discharge Summary  Patient ID: Laura Hester MRN: [id number] DOB/AGE: Mar 03, 1956 58 y.o.  Admit date: 10/24/2013 Discharge date: 10/27/2013  Primary Care Physician:  Gara Kroner, MD   Discharge Diagnoses:  Acute renal failure, IgA lambda myeloma, severe anemia, hyperkalemia  Present on Admission:  . Acute renal failure (ARF)  Discharge Medications:    Medication List    STOP taking these medications       dexamethasone 4 MG tablet  Commonly known as:  DECADRON     fluconazole 100 MG tablet  Commonly known as:  DIFLUCAN     lenalidomide 25 MG capsule  Commonly known as:  REVLIMID     omeprazole 20 MG capsule  Commonly known as:  PRILOSEC     potassium chloride SA 20 MEQ tablet  Commonly known as:  K-DUR,KLOR-CON     prochlorperazine 10 MG tablet  Commonly known as:  COMPAZINE     sulfamethoxazole-trimethoprim 800-160 MG per tablet  Commonly known as:  BACTRIM DS     valACYclovir 500 MG tablet  Commonly known as:  VALTREX      TAKE these medications       Estradiol 10 MCG Tabs vaginal tablet  Place 1 tablet (10 mcg total) vaginally 3 (three) times a week.     lidocaine-prilocaine cream  Commonly known as:  EMLA  Apply topically as needed.     LORazepam 0.5 MG tablet  Commonly known as:  ATIVAN  Take 1 tablet (0.5 mg total) by mouth every 6 (six) hours as needed (Nausea or vomiting).     sodium bicarbonate 650 MG tablet  Take 2 tablets (1,300 mg total) by mouth 2 (two) times daily.         Disposition and Follow-up: discharge to home; return to cancer center 12:30 PM July 20 for labs and visit  Significant Diagnostic Studies:  US Renal  10/24/2013   CLINICAL DATA:  Acute renal failure.  Multiple myeloma.  EXAM: RENAL/URINARY TRACT ULTRASOUND COMPLETE  COMPARISON:  Multiple exams, including 01/28/2013  FINDINGS: Right Kidney:  Length: 11.1 cm. Abnormal increased echogenicity. No mass or hydronephrosis.  Left Kidney:   Length: 11.8 cm. Abnormal increased echogenicity. No mass or hydronephrosis.  Bladder:  Appears normal for degree of bladder distention.  IMPRESSION: 1. Abnormal bilateral renal echogenicity favoring chronic medical renal disease. No hydronephrosis.   Electronically Signed   By: Sherryl Barters M.D.   On: 10/24/2013 19:30   US Biopsy  10/26/2013   CLINICAL DATA:  Acute renal failure and history of multiple myeloma.  EXAM: ULTRASOUND GUIDED CORE BIOPSY OF RIGHT KIDNEY  MEDICATIONS: 3.0 mg IV Versed; 75 mcg IV Fentanyl  Total Moderate Sedation Time: 15 min  PROCEDURE: The procedure, risks, benefits, and alternatives were explained to the patient. Questions regarding the procedure were encouraged and answered. The patient understands and consents to the procedure. A time-out procedure was performed.  The right flank region was prepped with Betadine in a sterile fashion, and a sterile drape was applied covering the operative field. A sterile gown and sterile gloves were used for the procedure. Local anesthesia was provided with 1% Lidocaine.  Ultrasound was used to localize both kidneys. After choosing the right kidney for biopsy, under direct ultrasound guidance, 3 passes with a 16 gauge core device were performed through lower pole renal cortex. Material was submitted in saline. Additional post biopsy imaging was performed with ultrasound.  COMPLICATIONS: None.  FINDINGS: Both kidneys are well visualized by ultrasound.  The lower pole cortex of the right kidney appeared more substantial than the left. The first pass yielded only a small fragment of cortex. Two additional passes yielded more substantial tissue.  IMPRESSION: Ultrasound-guided core biopsy performed of the lower pole of the right kidney.   Electronically Signed   By: Aletta Edouard M.D.   On: 10/26/2013 14:02   Mm Screening Breast Tomo Bilateral  10/05/2013   CLINICAL DATA:  Screening.  EXAM: DIGITAL SCREENING BILATERAL MAMMOGRAM WITH 3D TOMO WITH  CAD  COMPARISON:  Previous exam(s).  ACR Breast Density Category b: There are scattered areas of fibroglandular density.  FINDINGS: There are no findings suspicious for malignancy. Images were processed with CAD.  IMPRESSION: No mammographic evidence of malignancy. A result letter of this screening mammogram will be mailed directly to the patient.  RECOMMENDATION: Screening mammogram in one year. (Code:[id number]-B-01Y)  BI-RADS CATEGORY  1: Negative.   Electronically Signed   By: Lovey Newcomer M.D.   On: 10/05/2013 13:12    Discharge Laboratory Values: Basic Metabolic Panel:  Recent Labs Lab 10/24/13 1820 10/25/13 0424 10/25/13 1310 10/26/13 0505 10/27/13 0457  NA 142 143 143 145 150*  K 5.0 5.4* 4.6 4.7 4.3  CL 110 111 109 111 114*  CO2 13* 13* 13* 13* 15*  GLUCOSE 175* 121* 97 87 87  BUN 46* 51* 53* 56* 55*  CREATININE 4.98* 5.10* 5.09* 5.47* 5.88*  CALCIUM 7.4* 7.1* 7.0* 7.4* 7.3*  MG 1.4*  --   --   --   --   PHOS 4.8*  --   --   --   --    GFR The CrCl is unknown because both a height and weight (above a minimum accepted value) are required for this calculation. Liver Function Tests:  Recent Labs Lab 10/24/13 1055 10/24/13 1820 10/25/13 1310  AST 17 15 15   ALT 22 18 16   ALKPHOS 54 55 48  BILITOT 0.25 <0.2* <0.2*  PROT 7.1 7.2 6.6  ALBUMIN 2.3* 2.4* 2.3*   No results found for this basename: LIPASE, AMYLASE,  in the last 168 hours No results found for this basename: AMMONIA,  in the last 168 hours Coagulation profile  Recent Labs Lab 10/24/13 2142  INR 1.30    CBC:  Recent Labs Lab 10/24/13 1054 10/24/13 1820 10/25/13 1310 10/26/13 0505 10/27/13 0457  WBC 4.7 5.0 7.0 5.5 4.3  NEUTROABS 3.9 4.5 4.6 3.4 PENDING  HGB 8.7* 8.4* 7.5* 7.1* 8.7*  HCT 27.8* 27.4* 24.5* 23.5* 27.7*  MCV 90.1 90.7 92.5 92.2 90.8  PLT 288 306 282 266 PENDING   Cardiac Enzymes: No results found for this basename: CKTOTAL, CKMB, CKMBINDEX, TROPONINI,  in the last 168 hours BNP: No  components found with this basename: POCBNP,  CBG: No results found for this basename: GLUCAP,  in the last 168 hours D-Dimer No results found for this basename: DDIMER,  in the last 72 hours Hgb A1c No results found for this basename: HGBA1C,  in the last 72 hours Lipid Profile No results found for this basename: CHOL, HDL, LDLCALC, TRIG, CHOLHDL, LDLDIRECT,  in the last 72 hours Thyroid function studies No results found for this basename: TSH, T4TOTAL, FREET3, T3FREE, THYROIDAB,  in the last 72 hours Anemia work up  Recent Labs  10/24/13 1820 10/27/13 0457  VITAMINB12 585  --   FOLATE 12.9  --   RETICCTPCT 0.4 0.5   Sepsis Labs No components found with this basename: PROCALCITONIN,  WBC,  LACTICIDVEN,  Microbiology Recent Results (from the past 240 hour(s))  URINE CULTURE     Status: None   Collection Time    10/24/13  5:30 PM      Result Value Ref Range Status   Specimen Description URINE, RANDOM   Final   Special Requests PATIENT ON FOLLOWING SEPTRA   Final   Culture  Setup Time     Final   Value: 10/24/2013 18:34     Performed at [facility]     Final   Value: NO GROWTH     Performed at [facility]   Culture     Final   Value: NO GROWTH     Performed at [facility]   Report Status 10/25/2013 FINAL   Final     Brief H and P: For complete details please refer to admission H and P, but in brief, the patient was admitted with new-onset renal failure--most recent creatitnine 09/26/2013 was 0.7, next 5.2 on 10/24/2013. In between, on 10/10/2013 patient had a dose of zolendronate--which she receives monthly. It seems unlikely this would have been the cause. Patient's light chains have been rising and a change in her myeloma treatment was contemplated. For further details see Hospital Course below  Physical Exam at Discharge: BP 121/69  Pulse 79  Temp(Src) 98.1 F (36.7 C) (Oral)  Resp 17  Wt 168 lb 14 oz (76.6 kg)  SpO2 96%   LMP 04/20/2011 Gen: middle aged white woman who is comfortable and ambulatory Cardiovascular: RRR, no murmur appreciated Respiratory: no rales or rhonchi, good excursion bilaterally Gastrointestinal: soft, NT, +BS Extremities:no peripheral edema    Hospital Course:  Admitted with new onset ARF. Hydrated aggressively with no improvement in creatinine. Hyperkalemia corrected. As no response to IVF, patient was started on renal diet and fluid restricted. Renal biopsy obtained 10/26/2013 with results pending. Nephrology (Dr Pearson Grippe) consulted. He suggests it is safe to discharge with close observation. Plan is for labs TIW and consult at [facility] hopefully next week re next step re therapy. Patient's meds have been adjusted. She received 2 units PRBCs for severe anemia and retics and ferritin are pending--she will likely start epo and iron next week as outpatient. Active Problems:   Acute renal failure (ARF) 58 y.o. [place] woman presenting with anemia, found to have an IgA >6g, with an M-spike of 4.5 g (and a second M-spike of 0.4g), urine IFE showing IgA-lambda and lambda light chains, creatinine 0.77, calcium 10.0, albumin 3.1, and beta-2-microglobulin 5.51; with bone survey showing multiple myeloma lesions and bone marrow biopsy 09/01/2011 showing 75% myeloma cells. 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  (2) on prophylactic coumadin, discontinued as of 12/13/2011.  (3) zolendronic acid every 28 days, 09/19/2011 to 01/05/2012 (five doses)  (4) s/p neupogen mobilization October 2013, with a collection of 4.26 x10^6 CD34 positive cells  (5) status post melphalan at 200 mg/M2 on 02/23/2012, followed by autologous stem cell transplant the following day.  (6) on 06/07/2012 started maintenance bortezomib sq at  1.3 mg/M2 Q2w, with bactrim and acyclovir prophylaxis, the plan being to contuinue this for 2 years as per the HOVON trial  (7) zolendronic acid, given monthly, resumed June 2014, most recent dose 08/15/2013  (8) disease progression noted with  a rise in the IgA lambda fraction September 2014  (9) treated according to the [facility] S1304 randomized to the high dose arm, receiving carfilzomib on days 1, 2, 8, 9, 15, and 16 of every 28 day cycle. Cycle 1 given at a reduced dose of 20 mg per meter square, with subsequent cycles given at 56 mg per meter square. Regimen started 02/07/2013, discontinued as of 07/04/2013 (day 8 cycle 6) due to evidence of progression.  (10 enrolled in BMS 143, receiving elotuzumab weekly, with dexamethasone 40 mg weekly (taken on any non-elituzumab day) and lenalidomide at 25 mg/day for 21 days of each 28 day cycle, starting 08/01/2013  (a) prophylactic ASA 325 mg started with the first cycle  (b) prophylactic valacyclovir and trimethoprim-sulfamethoxazole continued as before  (c) received DTaP, Hep B and inactivated polio (Pediarix), H-flu and pneumococcal vacccines (HIb and PCV13) on 07/14/2013 at [facility]; to be repeated 09/13/2013 and October 2015     Diet:  Renal diet; 1200 cc (6 cups) fluid /day  Activity:  As tolerated  Condition at Discharge:   stable  Signed: Dr. Lurline Del [phone]  10/27/2013, 7:52 AM

## 2013-10-27 NOTE — Plan of Care (Signed)
Problem: Phase II Progression Outcomes Goal: Discharge plan established Patient may go home today depending on renal function lab work. Awaiting renal biopsy results, however may be done outpatient.

## 2013-10-28 LAB — ANTI-DNASE B ANTIBODY: Anti-DNAse-B: <95 U/mL (ref <301)

## 2013-10-31 ENCOUNTER — Other Ambulatory Visit: Payer: BC Managed Care – PPO

## 2013-10-31 ENCOUNTER — Ambulatory Visit: Payer: BC Managed Care – PPO | Admitting: Oncology

## 2013-11-03 ENCOUNTER — Telehealth: Payer: Self-pay | Admitting: Oncology

## 2013-11-03 NOTE — Telephone Encounter (Signed)
Lvm advising apt 7/31 @ 2.30p.

## 2013-11-07 ENCOUNTER — Other Ambulatory Visit: Payer: BC Managed Care – PPO

## 2013-11-07 ENCOUNTER — Ambulatory Visit: Payer: BC Managed Care – PPO

## 2013-11-09 ENCOUNTER — Encounter (HOSPITAL_COMMUNITY): Payer: Self-pay

## 2013-11-11 ENCOUNTER — Other Ambulatory Visit: Payer: BC Managed Care – PPO

## 2013-11-11 ENCOUNTER — Other Ambulatory Visit: Payer: Self-pay | Admitting: *Deleted

## 2013-11-11 ENCOUNTER — Ambulatory Visit: Payer: BC Managed Care – PPO | Admitting: Oncology

## 2013-11-14 ENCOUNTER — Other Ambulatory Visit: Payer: Self-pay | Admitting: Oncology

## 2013-11-14 NOTE — Progress Notes (Unsigned)
Updated Laura Hester is situation today by calling Laura Hester at home. After her discharge from: Hospital she went straight to [facility] where she was admitted. She was started on pheresis and a combination of pomalidomide cyclophosphamide and dexamethasone. She was also started on hemodialysis which he receives Monday Wednesday and Friday.  Their hoping she will be able to get off dialysis at some point, but that is uncertain. For logistical reasons relating to insurance apparently she will need to do her dialysis in [place], but the patient does have a nephrologist, Dr. Joelyn Oms, in [place] is working with him (he trained there).  She is needing transfusions, is needing an effort a pheresis catheter placed, and of course I will need very intensive followup. Laura Hester knows that we will be glad to facilitate her treatment here in any way we can. He will let us know when she is discharged so we can facilitate her care.  We also discussed the possibility of an allogenic transplant at [facility]. This is not an option that he and Laura Hester are interested in considering.

## 2013-11-16 ENCOUNTER — Other Ambulatory Visit: Payer: Self-pay | Admitting: *Deleted

## 2013-11-17 ENCOUNTER — Telehealth: Payer: Self-pay | Admitting: *Deleted

## 2013-11-17 NOTE — Telephone Encounter (Signed)
Attempted to call [employer] for authorization on Revlimid refill.  Was informed that pt is now taking Pomalyst from  [facility].  Called [facility] and informed pharmacy technician that pt no longer taking Revlimid.    Message to Dr. Jana Hakim for review.

## 2013-12-01 ENCOUNTER — Telehealth: Payer: Self-pay | Admitting: Oncology

## 2013-12-01 ENCOUNTER — Other Ambulatory Visit: Payer: Self-pay | Admitting: *Deleted

## 2013-12-01 DIAGNOSIS — D630 Anemia in neoplastic disease: Secondary | ICD-10-CM

## 2013-12-01 DIAGNOSIS — C9000 Multiple myeloma not having achieved remission: Secondary | ICD-10-CM

## 2013-12-01 NOTE — Telephone Encounter (Signed)
, °

## 2013-12-05 ENCOUNTER — Telehealth: Payer: Self-pay | Admitting: *Deleted

## 2013-12-05 ENCOUNTER — Encounter: Payer: Self-pay | Admitting: Cardiovascular Disease

## 2013-12-05 NOTE — Telephone Encounter (Signed)
Charting error.

## 2013-12-06 ENCOUNTER — Other Ambulatory Visit: Payer: Self-pay | Admitting: Oncology

## 2013-12-06 ENCOUNTER — Ambulatory Visit (HOSPITAL_BASED_OUTPATIENT_CLINIC_OR_DEPARTMENT_OTHER): Payer: BC Managed Care – PPO

## 2013-12-06 ENCOUNTER — Other Ambulatory Visit (HOSPITAL_BASED_OUTPATIENT_CLINIC_OR_DEPARTMENT_OTHER): Payer: BC Managed Care – PPO

## 2013-12-06 ENCOUNTER — Other Ambulatory Visit: Payer: Self-pay | Admitting: *Deleted

## 2013-12-06 DIAGNOSIS — C9000 Multiple myeloma not having achieved remission: Secondary | ICD-10-CM

## 2013-12-06 DIAGNOSIS — Z95828 Presence of other vascular implants and grafts: Secondary | ICD-10-CM

## 2013-12-06 DIAGNOSIS — D630 Anemia in neoplastic disease: Secondary | ICD-10-CM

## 2013-12-06 LAB — CBC WITH DIFFERENTIAL/PLATELET
BASO%: 2.9 % — ABNORMAL HIGH (ref 0.0–2.0)
BASOS ABS: 0.1 10*3/uL (ref 0.0–0.1)
EOS%: 6 % (ref 0.0–7.0)
Eosinophils Absolute: 0.2 10*3/uL (ref 0.0–0.5)
HEMATOCRIT: 29.2 % — AB (ref 34.8–46.6)
HEMOGLOBIN: 9.4 g/dL — AB (ref 11.6–15.9)
LYMPH#: 0.5 10*3/uL — AB (ref 0.9–3.3)
LYMPH%: 13.7 % — ABNORMAL LOW (ref 14.0–49.7)
MCH: 29.1 pg (ref 25.1–34.0)
MCHC: 32.2 g/dL (ref 31.5–36.0)
MCV: 90.1 fL (ref 79.5–101.0)
MONO#: 0.3 10*3/uL (ref 0.1–0.9)
MONO%: 8.5 % (ref 0.0–14.0)
NEUT#: 2.4 10*3/uL (ref 1.5–6.5)
NEUT%: 68.9 % (ref 38.4–76.8)
Platelets: 172 10*3/uL (ref 145–400)
RBC: 3.24 10*6/uL — ABNORMAL LOW (ref 3.70–5.45)
RDW: 17.8 % — AB (ref 11.2–14.5)
WBC: 3.5 10*3/uL — ABNORMAL LOW (ref 3.9–10.3)

## 2013-12-06 LAB — COMPREHENSIVE METABOLIC PANEL (CC13)
ALT: 26 U/L (ref 0–55)
AST: 16 U/L (ref 5–34)
Albumin: 2.5 g/dL — ABNORMAL LOW (ref 3.5–5.0)
Alkaline Phosphatase: 121 U/L (ref 40–150)
Anion Gap: 10 mEq/L (ref 3–11)
BUN: 19.3 mg/dL (ref 7.0–26.0)
CALCIUM: 7.7 mg/dL — AB (ref 8.4–10.4)
CHLORIDE: 101 meq/L (ref 98–109)
CO2: 27 mEq/L (ref 22–29)
Creatinine: 4 mg/dL (ref 0.6–1.1)
GLUCOSE: 106 mg/dL (ref 70–140)
POTASSIUM: 3.8 meq/L (ref 3.5–5.1)
SODIUM: 138 meq/L (ref 136–145)
TOTAL PROTEIN: 5.3 g/dL — AB (ref 6.4–8.3)
Total Bilirubin: 0.54 mg/dL (ref 0.20–1.20)

## 2013-12-06 LAB — HOLD TUBE, BLOOD BANK

## 2013-12-06 MED ORDER — HEPARIN SOD (PORK) LOCK FLUSH 100 UNIT/ML IV SOLN
500.0000 [IU] | Freq: Once | INTRAVENOUS | Status: AC
Start: 2013-12-06 — End: 2013-12-06
  Administered 2013-12-06: 500 [IU] via INTRAVENOUS
  Filled 2013-12-06: qty 5

## 2013-12-06 MED ORDER — SODIUM CHLORIDE 0.9 % IJ SOLN
10.0000 mL | INTRAMUSCULAR | Status: DC | PRN
  Administered 2013-12-06: 10 mL via INTRAVENOUS
  Filled 2013-12-06: qty 10

## 2013-12-06 NOTE — Patient Instructions (Signed)

## 2013-12-07 ENCOUNTER — Other Ambulatory Visit: Payer: Self-pay | Admitting: Oncology

## 2013-12-07 ENCOUNTER — Encounter: Payer: Self-pay | Admitting: *Deleted

## 2013-12-07 ENCOUNTER — Ambulatory Visit (INDEPENDENT_AMBULATORY_CARE_PROVIDER_SITE_OTHER): Payer: BC Managed Care – PPO | Admitting: Cardiovascular Disease

## 2013-12-07 ENCOUNTER — Encounter: Payer: Self-pay | Admitting: Cardiovascular Disease

## 2013-12-07 ENCOUNTER — Ambulatory Visit (INDEPENDENT_AMBULATORY_CARE_PROVIDER_SITE_OTHER): Payer: BC Managed Care – PPO | Admitting: Pharmacist

## 2013-12-07 ENCOUNTER — Telehealth: Payer: Self-pay | Admitting: Nurse Practitioner

## 2013-12-07 VITALS — BP 124/68 | HR 113 | Ht 62.0 in | Wt 170.0 lb

## 2013-12-07 DIAGNOSIS — I4891 Unspecified atrial fibrillation: Secondary | ICD-10-CM

## 2013-12-07 DIAGNOSIS — I482 Chronic atrial fibrillation, unspecified: Secondary | ICD-10-CM

## 2013-12-07 DIAGNOSIS — Z5181 Encounter for therapeutic drug level monitoring: Secondary | ICD-10-CM | POA: Insufficient documentation

## 2013-12-07 LAB — KAPPA/LAMBDA LIGHT CHAINS
Kappa free light chain: 0.26 mg/dL — ABNORMAL LOW (ref 0.33–1.94)
Kappa:Lambda Ratio: 0 — ABNORMAL LOW (ref 0.26–1.65)
LAMBDA FREE LGHT CHN: 82.1 mg/dL — AB (ref 0.57–2.63)

## 2013-12-07 MED ORDER — METOPROLOL TARTRATE 50 MG PO TABS: 50.0000 mg | ORAL_TABLET | Freq: Two times a day (BID) | ORAL | Status: DC

## 2013-12-07 MED ORDER — WARFARIN SODIUM 5 MG PO TABS: ORAL_TABLET | ORAL | Status: DC

## 2013-12-07 NOTE — Assessment & Plan Note (Signed)
At this point chronic.  Increased symptoms  Moderate to severe LAE poor prognostic sign for maint NSR.  Start lopressor 50 bid.  Start coumadin.  NOAC less appealing due to fluctuating renal function and risk of bleeding with low PLTls during chemo and inability to reverse newer agents.  Consider [facility] after 3 weeks of Rx INR  At least report of echo does not support amyloidosis of heart even if kidney biopsy positive.  Cannot have MRI with gadolinium to diagnose due to renal failure.  Will repeat echo post [facility] attempt

## 2013-12-07 NOTE — Progress Notes (Signed)
Labs from 12/07/13 faxed to [facility].

## 2013-12-07 NOTE — Patient Instructions (Signed)
Your physician recommends that you schedule a follow-up appointment in: [place] has recommended you make the following change in your medication:   START LOPRESSOR 50 MG   TWICE  DAILY  AND COUMADIN  AS  DIRECTED NEEDS WEEKLY I NRS

## 2013-12-07 NOTE — Progress Notes (Signed)
Patient ID: Laura Hester, female   DOB: 03-28-1956, 58 y.o.   MRN: [id number]   59 yo patient of [facility] oncology and Dr Jana Hakim.  She has multiple myeloma since 5/13.  Recently complicated by acute renal failure.  In July Cr as high as 5.8 Undergoing Rx with cyclophosphamide, dexamethasone.  She has been on ASA 325 mg  .  She has atrial fibrillation. Echo 07/15/13 showed moderate to severe LAE  - Left ventricle: The cavity size was normal. Systolic function was vigorous. The estimated ejection fraction was in the range of 65% to 70%. Wall motion was normal; there were no regional wall motion abnormalities. - Left atrium: The atrium was moderately to severely dilated. - Atrial septum: No defect or patent foramen ovale was identified. - Pulmonary arteries: Systolic pressure was mildly increased. PA peak pressure: 19m Hg (S). - Line: A venous catheter was visualized in the superior vena cava, with its tip in the right atrium. No abnormal features noted.  She is getting dialysis M,W, F  She has been anemic and required transfusions but PLT count ok No bleeding issues.  Urine output decreases with chemo cycles Indicates possibility of amyloidosis.  Having more dyspnea and palpitations No chest pain    ROS: Denies fever, malais, weight loss, blurry vision, decreased visual acuity, cough, sputum, SOB, hemoptysis, pleuritic pain, palpitaitons, heartburn, abdominal pain, melena, lower extremity edema, claudication, or rash.  All other systems reviewed and negative   General: Affect appropriate Chronically ill white female  HEENT: normal Neck supple with no adenopathy JVP normal no bruits no thyromegaly Lungs clear with no wheezing and good diaphragmatic motion Heart:  S1/S2 no murmur,rub, gallop or click PMI normal Abdomen: benighn, BS positve, no tenderness, no AAA no bruit.  No HSM or HJR Distal pulses intact with no bruits No edema Neuro non-focal Skin warm and dry No muscular  weakness Port a cath right chest and triple lumen left   Medications Current Outpatient Prescriptions  Medication Sig Dispense Refill  . aspirin 325 MG tablet Take 325 mg by mouth daily.      . Cyclophosphamide 50 MG CAPS Take 8 tablets by mouth once a week.      .Marland Kitchendexamethasone (DECADRON) 4 MG tablet 10 tabs weekly      . docusate sodium (COLACE) 100 MG capsule Take 100 mg by mouth as needed for mild constipation. For  constipation      . lidocaine-prilocaine (EMLA) cream Apply topically as needed.  30 g  3  . Polyethylene Glycol 3350 (MIRALAX PO) Take by mouth as needed. For constipation      . pomalidomide (POMALYST) 4 MG capsule Take 4 mg by mouth daily. Rest for 7      . valACYclovir (VALTREX) 500 MG tablet Take 500 mg by mouth every other day.       No current facility-administered medications for this visit.   Facility-Administered Medications Ordered in Other Visits  Medication Dose Route Frequency Provider Last Rate Last Dose  . sodium chloride 0.9 % injection 10 mL  10 mL Intracatheter PRN Amy G Berry, PA-C   10 mL at 05/30/13 1640  . sodium chloride 0.9 % injection 10 mL  10 mL Intravenous PRN GChauncey Cruel MD   10 mL at 08/08/13 1518    Allergies Wellbutrin  Family History: Family History  Problem Relation Age of Onset  . Heart disease Father   . Breast cancer Mother   . Heart disease Mother   .  Diabetes Paternal Grandfather   . Hypertension Maternal Grandmother   . Hypertension Brother     Social History: History   Social History  . Marital Status: Married    Spouse Name: N/A    Number of Children: 2  . Years of Education: N/A   Occupational History  . retired Pharmacist, hospital   .  [place] History Main Topics  . Smoking status: Never Smoker   . Smokeless tobacco: Never Used  . Alcohol Use: No  . Drug Use: No  . Sexual Activity: Not on file   Other Topics Concern  . Not on file   Social History Narrative  . No narrative on  file    Electrocardiogram:  ratpid afib nonspecifc ST changes  Rate 113  Assessment and Plan

## 2013-12-08 ENCOUNTER — Other Ambulatory Visit: Payer: Self-pay | Admitting: Oncology

## 2013-12-08 DIAGNOSIS — C9002 Multiple myeloma in relapse: Secondary | ICD-10-CM

## 2013-12-08 LAB — SPEP & IFE WITH QIG
Albumin ELP: 51.4 % — ABNORMAL LOW (ref 55.8–66.1)
Alpha-1-Globulin: 8.2 % — ABNORMAL HIGH (ref 2.9–4.9)
Alpha-2-Globulin: 14.4 % — ABNORMAL HIGH (ref 7.1–11.8)
BETA 2: 13.7 % — AB (ref 3.2–6.5)
BETA GLOBULIN: 6.7 % (ref 4.7–7.2)
Gamma Globulin: 5.6 % — ABNORMAL LOW (ref 11.1–18.8)
IGA: 691 mg/dL — AB (ref 69–380)
IgG (Immunoglobin G), Serum: 72 mg/dL — ABNORMAL LOW (ref 690–1700)
IgM, Serum: <5 mg/dL — ABNORMAL LOW (ref 52–322)
M-Spike, %: 0.25 g/dL
Total Protein, Serum Electrophoresis: 5 g/dL — ABNORMAL LOW (ref 6.0–8.3)

## 2013-12-08 NOTE — Telephone Encounter (Signed)
Encounter opened in error

## 2013-12-12 ENCOUNTER — Ambulatory Visit (INDEPENDENT_AMBULATORY_CARE_PROVIDER_SITE_OTHER): Payer: BC Managed Care – PPO | Admitting: *Deleted

## 2013-12-12 DIAGNOSIS — I4891 Unspecified atrial fibrillation: Secondary | ICD-10-CM

## 2013-12-12 DIAGNOSIS — Z5181 Encounter for therapeutic drug level monitoring: Secondary | ICD-10-CM

## 2013-12-12 LAB — POCT INR: INR: 3

## 2013-12-13 ENCOUNTER — Other Ambulatory Visit: Payer: Self-pay | Admitting: *Deleted

## 2013-12-13 ENCOUNTER — Ambulatory Visit (HOSPITAL_COMMUNITY)
Admission: RE | Admit: 2013-12-13 | Discharge: 2013-12-13 | Disposition: A | Discharge status: Home / Self Care | Payer: BC Managed Care – PPO | Source: Ambulatory Visit | Attending: Oncology | Admitting: Oncology

## 2013-12-13 ENCOUNTER — Non-Acute Institutional Stay (HOSPITAL_COMMUNITY)
Admission: AD | Admit: 2013-12-13 | Discharge: 2013-12-13 | Disposition: A | Discharge status: Home / Self Care | Payer: BC Managed Care – PPO | Source: Ambulatory Visit | Attending: Oncology | Admitting: Oncology

## 2013-12-13 ENCOUNTER — Other Ambulatory Visit (HOSPITAL_BASED_OUTPATIENT_CLINIC_OR_DEPARTMENT_OTHER): Payer: BC Managed Care – PPO

## 2013-12-13 ENCOUNTER — Other Ambulatory Visit: Payer: Self-pay | Admitting: Oncology

## 2013-12-13 ENCOUNTER — Ambulatory Visit: Payer: BC Managed Care – PPO

## 2013-12-13 DIAGNOSIS — D649 Anemia, unspecified: Secondary | ICD-10-CM | POA: Insufficient documentation

## 2013-12-13 DIAGNOSIS — T451X5A Adverse effect of antineoplastic and immunosuppressive drugs, initial encounter: Principal | ICD-10-CM

## 2013-12-13 DIAGNOSIS — D6481 Anemia due to antineoplastic chemotherapy: Secondary | ICD-10-CM

## 2013-12-13 DIAGNOSIS — C9002 Multiple myeloma in relapse: Secondary | ICD-10-CM

## 2013-12-13 DIAGNOSIS — C9000 Multiple myeloma not having achieved remission: Secondary | ICD-10-CM

## 2013-12-13 DIAGNOSIS — Z95828 Presence of other vascular implants and grafts: Secondary | ICD-10-CM

## 2013-12-13 DIAGNOSIS — D630 Anemia in neoplastic disease: Secondary | ICD-10-CM

## 2013-12-13 LAB — CBC WITH DIFFERENTIAL/PLATELET
BASO%: 3.1 % — ABNORMAL HIGH (ref 0.0–2.0)
BASOS ABS: 0.1 10*3/uL (ref 0.0–0.1)
EOS%: 10.9 % — ABNORMAL HIGH (ref 0.0–7.0)
Eosinophils Absolute: 0.4 10*3/uL (ref 0.0–0.5)
HCT: 25.2 % — ABNORMAL LOW (ref 34.8–46.6)
HEMOGLOBIN: 7.9 g/dL — AB (ref 11.6–15.9)
LYMPH%: 13.7 % — AB (ref 14.0–49.7)
MCH: 28.8 pg (ref 25.1–34.0)
MCHC: 31.3 g/dL — AB (ref 31.5–36.0)
MCV: 92 fL (ref 79.5–101.0)
MONO#: 0.7 10*3/uL (ref 0.1–0.9)
MONO%: 16.8 % — AB (ref 0.0–14.0)
NEUT#: 2.2 10*3/uL (ref 1.5–6.5)
NEUT%: 55.5 % (ref 38.4–76.8)
Platelets: 175 10*3/uL (ref 145–400)
RBC: 2.74 10*6/uL — ABNORMAL LOW (ref 3.70–5.45)
RDW: 18 % — ABNORMAL HIGH (ref 11.2–14.5)
WBC: 3.9 10*3/uL (ref 3.9–10.3)
lymph#: 0.5 10*3/uL — ABNORMAL LOW (ref 0.9–3.3)

## 2013-12-13 LAB — PREPARE RBC (CROSSMATCH)

## 2013-12-13 LAB — HOLD TUBE, BLOOD BANK

## 2013-12-13 MED ORDER — SODIUM CHLORIDE 0.9 % IJ SOLN: 10.0000 mL | INTRAMUSCULAR | Status: DC | PRN

## 2013-12-13 MED ORDER — ACETAMINOPHEN 325 MG PO TABS
650.0000 mg | ORAL_TABLET | Freq: Once | ORAL | Status: AC
  Administered 2013-12-13: 650 mg via ORAL
  Filled 2013-12-13: qty 2

## 2013-12-13 MED ORDER — SODIUM CHLORIDE 0.9 % IJ SOLN: 3.0000 mL | INTRAMUSCULAR | Status: DC | PRN

## 2013-12-13 MED ORDER — SODIUM CHLORIDE 0.9 % IV SOLN
250.0000 mL | Freq: Once | INTRAVENOUS | Status: AC
  Administered 2013-12-13: 250 mL via INTRAVENOUS

## 2013-12-13 MED ORDER — HEPARIN SOD (PORK) LOCK FLUSH 100 UNIT/ML IV SOLN: 250.0000 [IU] | INTRAVENOUS | Status: DC | PRN

## 2013-12-13 MED ORDER — HEPARIN SOD (PORK) LOCK FLUSH 100 UNIT/ML IV SOLN
500.0000 [IU] | Freq: Once | INTRAVENOUS | Status: AC
  Administered 2013-12-13: 500 [IU] via INTRAVENOUS
  Filled 2013-12-13: qty 5

## 2013-12-13 MED ORDER — DIPHENHYDRAMINE HCL 25 MG PO CAPS
25.0000 mg | ORAL_CAPSULE | Freq: Once | ORAL | Status: AC
  Administered 2013-12-13: 25 mg via ORAL
  Filled 2013-12-13: qty 1

## 2013-12-13 MED ORDER — SODIUM CHLORIDE 0.9 % IJ SOLN
10.0000 mL | INTRAMUSCULAR | Status: DC | PRN
Start: 2013-12-13 — End: 2013-12-13
  Administered 2013-12-13: 10 mL via INTRAVENOUS
  Filled 2013-12-13: qty 10

## 2013-12-13 MED ORDER — HEPARIN SOD (PORK) LOCK FLUSH 100 UNIT/ML IV SOLN
500.0000 [IU] | Freq: Every day | INTRAVENOUS | Status: AC | PRN
  Administered 2013-12-13: 500 [IU]
  Filled 2013-12-13: qty 5

## 2013-12-13 NOTE — Procedures (Signed)
[facility]  Procedure Note  Laura Hester OMA:[id number] DOB: May 23, 1955 DOA: 12/13/2013  Ordering Physician: Dr. 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 No complaints. VSS. Patient discharged home. Patient in no apparent distress. Instructed patient to seek medical attention if any changes in condition, patient acknowledges. Patient left day hospital with belongings via wheelchair escorted by RN staff.   Wendie Simmer, RN  [facility]

## 2013-12-13 NOTE — Patient Instructions (Signed)

## 2013-12-13 NOTE — Discharge Instructions (Signed)
Anemia, Nonspecific Anemia is a condition in which the concentration of red blood cells or hemoglobin in the blood is below normal. Hemoglobin is a substance in red blood cells that carries oxygen to the tissues of the body. Anemia results in not enough oxygen reaching these tissues.  CAUSES  Common causes of anemia include:   Excessive bleeding. Bleeding may be internal or external. This includes excessive bleeding from periods (in women) or from the intestine.   Poor nutrition.   Chronic kidney, thyroid, and liver disease.  Bone marrow disorders that decrease red blood cell production.  Cancer and treatments for cancer.  HIV, AIDS, and their treatments.  Spleen problems that increase red blood cell destruction.  Blood disorders.  Excess destruction of red blood cells due to infection, medicines, and autoimmune disorders. SIGNS AND SYMPTOMS   Minor weakness.   Dizziness.   Headache.  Palpitations.   Shortness of breath, especially with exercise.   Paleness.  Cold sensitivity.  Indigestion.  Nausea.  Difficulty sleeping.  Difficulty concentrating. Symptoms may occur suddenly or they may develop slowly.  DIAGNOSIS  Additional blood tests are often needed. These help your health care provider determine the best treatment. Your health care provider will check your stool for blood and look for other causes of blood loss.  TREATMENT  Treatment varies depending on the cause of the anemia. Treatment can include:   Supplements of iron, vitamin B12, or folic acid.   Hormone medicines.   A blood transfusion. This may be needed if blood loss is severe.   Hospitalization. This may be needed if there is significant continual blood loss.   Dietary changes.  Spleen removal. HOME CARE INSTRUCTIONS Keep all follow-up appointments. It often takes many weeks to correct anemia, and having your health care provider check on your condition and your response to  treatment is very important. SEEK IMMEDIATE MEDICAL CARE IF:   You develop extreme weakness, shortness of breath, or chest pain.   You become dizzy or have trouble concentrating.  You develop heavy vaginal bleeding.   You develop a rash.   You have bloody or black, tarry stools.   You faint.   You vomit up blood.   You vomit repeatedly.   You have abdominal pain.  You have a fever or persistent symptoms for more than 2-3 days.   You have a fever and your symptoms suddenly get worse.   You are dehydrated.  MAKE SURE YOU:  Understand these instructions.  Will watch your condition.  Will get help right away if you are not doing well or get worse. Document Released: 05/08/2004 Document Revised: 12/01/2012 Document Reviewed: 09/24/2012 ExitCare Patient Information 2015 [employer]. This information is not intended to replace advice given to you by your health care provider. Make sure you discuss any questions you have with your health care provider.  

## 2013-12-14 LAB — TYPE AND SCREEN
ABO/RH(D): A POS
Antibody Screen: NEGATIVE
Unit division: 0
Unit division: 0

## 2013-12-15 LAB — KAPPA/LAMBDA LIGHT CHAINS
Kappa free light chain: 1.66 mg/dL (ref 0.33–1.94)
Kappa:Lambda Ratio: 0.03 — ABNORMAL LOW (ref 0.26–1.65)
Lambda Free Lght Chn: 57.6 mg/dL — ABNORMAL HIGH (ref 0.57–2.63)

## 2013-12-15 LAB — PROTEIN ELECTROPHORESIS, SERUM
ALBUMIN ELP: 55.4 % — AB (ref 55.8–66.1)
ALPHA-1-GLOBULIN: 8.7 % — AB (ref 2.9–4.9)
ALPHA-2-GLOBULIN: 14.5 % — AB (ref 7.1–11.8)
Beta 2: 10.3 % — ABNORMAL HIGH (ref 3.2–6.5)
Beta Globulin: 6.5 % (ref 4.7–7.2)
Gamma Globulin: 4.6 % — ABNORMAL LOW (ref 11.1–18.8)
M-Spike, %: 0.19 g/dL
Total Protein, Serum Electrophoresis: 4.8 g/dL — ABNORMAL LOW (ref 6.0–8.3)

## 2013-12-20 ENCOUNTER — Ambulatory Visit (INDEPENDENT_AMBULATORY_CARE_PROVIDER_SITE_OTHER): Payer: BC Managed Care – PPO | Admitting: Pharmacist

## 2013-12-20 DIAGNOSIS — I4891 Unspecified atrial fibrillation: Secondary | ICD-10-CM

## 2013-12-20 DIAGNOSIS — Z5181 Encounter for therapeutic drug level monitoring: Secondary | ICD-10-CM

## 2013-12-20 LAB — POCT INR: INR: 2.4

## 2013-12-23 ENCOUNTER — Telehealth: Payer: Self-pay | Admitting: Oncology

## 2013-12-23 ENCOUNTER — Other Ambulatory Visit: Payer: Self-pay | Admitting: *Deleted

## 2013-12-23 NOTE — Telephone Encounter (Signed)
lvm for pt regarding to labs added

## 2013-12-26 ENCOUNTER — Encounter: Payer: Self-pay | Admitting: *Deleted

## 2013-12-26 ENCOUNTER — Ambulatory Visit (INDEPENDENT_AMBULATORY_CARE_PROVIDER_SITE_OTHER): Payer: BC Managed Care – PPO | Admitting: Pharmacist

## 2013-12-26 DIAGNOSIS — I4891 Unspecified atrial fibrillation: Secondary | ICD-10-CM

## 2013-12-26 DIAGNOSIS — Z5181 Encounter for therapeutic drug level monitoring: Secondary | ICD-10-CM

## 2013-12-26 LAB — POCT INR: INR: 2.5

## 2013-12-27 ENCOUNTER — Ambulatory Visit (HOSPITAL_BASED_OUTPATIENT_CLINIC_OR_DEPARTMENT_OTHER): Payer: BC Managed Care – PPO

## 2013-12-27 ENCOUNTER — Other Ambulatory Visit: Payer: Self-pay | Admitting: *Deleted

## 2013-12-27 ENCOUNTER — Encounter (HOSPITAL_COMMUNITY): Payer: Self-pay | Admitting: Pharmacist

## 2013-12-27 ENCOUNTER — Other Ambulatory Visit (HOSPITAL_BASED_OUTPATIENT_CLINIC_OR_DEPARTMENT_OTHER): Payer: BC Managed Care – PPO

## 2013-12-27 DIAGNOSIS — D630 Anemia in neoplastic disease: Secondary | ICD-10-CM

## 2013-12-27 DIAGNOSIS — C9000 Multiple myeloma not having achieved remission: Secondary | ICD-10-CM

## 2013-12-27 DIAGNOSIS — Z452 Encounter for adjustment and management of vascular access device: Secondary | ICD-10-CM

## 2013-12-27 DIAGNOSIS — C9002 Multiple myeloma in relapse: Secondary | ICD-10-CM

## 2013-12-27 DIAGNOSIS — Z95828 Presence of other vascular implants and grafts: Secondary | ICD-10-CM

## 2013-12-27 LAB — COMPREHENSIVE METABOLIC PANEL (CC13)
ALK PHOS: 80 U/L (ref 40–150)
ALT: 19 U/L (ref 0–55)
AST: 17 U/L (ref 5–34)
Albumin: 2.6 g/dL — ABNORMAL LOW (ref 3.5–5.0)
Anion Gap: 9 mEq/L (ref 3–11)
BUN: 25.1 mg/dL (ref 7.0–26.0)
CO2: 28 mEq/L (ref 22–29)
Calcium: 7.2 mg/dL — ABNORMAL LOW (ref 8.4–10.4)
Chloride: 102 mEq/L (ref 98–109)
Creatinine: 4.1 mg/dL (ref 0.6–1.1)
Glucose: 132 mg/dl (ref 70–140)
Potassium: 4.1 mEq/L (ref 3.5–5.1)
Sodium: 139 mEq/L (ref 136–145)
TOTAL PROTEIN: 5.1 g/dL — AB (ref 6.4–8.3)
Total Bilirubin: 0.58 mg/dL (ref 0.20–1.20)

## 2013-12-27 LAB — CBC WITH DIFFERENTIAL/PLATELET
BASO%: 10.9 % — AB (ref 0.0–2.0)
Basophils Absolute: 0.3 10*3/uL — ABNORMAL HIGH (ref 0.0–0.1)
EOS%: 3.6 % (ref 0.0–7.0)
Eosinophils Absolute: 0.1 10*3/uL (ref 0.0–0.5)
HEMATOCRIT: 28.6 % — AB (ref 34.8–46.6)
HGB: 9.2 g/dL — ABNORMAL LOW (ref 11.6–15.9)
LYMPH#: 0.5 10*3/uL — AB (ref 0.9–3.3)
LYMPH%: 15.2 % (ref 14.0–49.7)
MCH: 29.3 pg (ref 25.1–34.0)
MCHC: 32.2 g/dL (ref 31.5–36.0)
MCV: 91.1 fL (ref 79.5–101.0)
MONO#: 0.8 10*3/uL (ref 0.1–0.9)
MONO%: 24.8 % — ABNORMAL HIGH (ref 0.0–14.0)
NEUT#: 1.4 10*3/uL — ABNORMAL LOW (ref 1.5–6.5)
NEUT%: 45.5 % (ref 38.4–76.8)
Platelets: 214 10*3/uL (ref 145–400)
RBC: 3.14 10*6/uL — AB (ref 3.70–5.45)
RDW: 18.4 % — ABNORMAL HIGH (ref 11.2–14.5)
WBC: 3 10*3/uL — AB (ref 3.9–10.3)
nRBC: 0 % (ref 0–0)

## 2013-12-27 MED ORDER — HEPARIN SOD (PORK) LOCK FLUSH 100 UNIT/ML IV SOLN
500.0000 [IU] | Freq: Once | INTRAVENOUS | Status: AC
  Administered 2013-12-27: 500 [IU] via INTRAVENOUS
  Filled 2013-12-27: qty 5

## 2013-12-27 MED ORDER — SODIUM CHLORIDE 0.9 % IJ SOLN
10.0000 mL | INTRAMUSCULAR | Status: DC | PRN
Start: 2013-12-27 — End: 2013-12-27
  Administered 2013-12-27: 10 mL via INTRAVENOUS
  Filled 2013-12-27: qty 10

## 2013-12-27 NOTE — Patient Instructions (Signed)

## 2013-12-29 ENCOUNTER — Ambulatory Visit (HOSPITAL_COMMUNITY): Payer: BC Managed Care – PPO | Admitting: Anesthesiology

## 2013-12-29 ENCOUNTER — Telehealth: Payer: Self-pay | Admitting: Oncology

## 2013-12-29 ENCOUNTER — Ambulatory Visit (INDEPENDENT_AMBULATORY_CARE_PROVIDER_SITE_OTHER): Payer: BC Managed Care – PPO

## 2013-12-29 ENCOUNTER — Encounter (HOSPITAL_COMMUNITY): Payer: Self-pay | Admitting: Anesthesiology

## 2013-12-29 ENCOUNTER — Encounter (HOSPITAL_COMMUNITY): Payer: BC Managed Care – PPO | Admitting: Anesthesiology

## 2013-12-29 ENCOUNTER — Other Ambulatory Visit: Payer: Self-pay | Admitting: Cardiology

## 2013-12-29 ENCOUNTER — Encounter (HOSPITAL_COMMUNITY)
Admission: RE | Disposition: A | Discharge status: Home / Self Care | Payer: Self-pay | Source: Ambulatory Visit | Attending: Cardiology

## 2013-12-29 ENCOUNTER — Ambulatory Visit (HOSPITAL_COMMUNITY)
Admission: RE | Admit: 2013-12-29 | Discharge: 2013-12-29 | Disposition: A | Discharge status: Home / Self Care | Payer: BC Managed Care – PPO | Source: Ambulatory Visit | Attending: Cardiology | Admitting: Cardiology

## 2013-12-29 ENCOUNTER — Other Ambulatory Visit: Payer: Self-pay | Admitting: Oncology

## 2013-12-29 DIAGNOSIS — Z7982 Long term (current) use of aspirin: Secondary | ICD-10-CM | POA: Insufficient documentation

## 2013-12-29 DIAGNOSIS — D649 Anemia, unspecified: Secondary | ICD-10-CM | POA: Diagnosis not present

## 2013-12-29 DIAGNOSIS — I4891 Unspecified atrial fibrillation: Secondary | ICD-10-CM

## 2013-12-29 DIAGNOSIS — Z992 Dependence on renal dialysis: Secondary | ICD-10-CM | POA: Diagnosis not present

## 2013-12-29 DIAGNOSIS — I120 Hypertensive chronic kidney disease with stage 5 chronic kidney disease or end stage renal disease: Secondary | ICD-10-CM | POA: Insufficient documentation

## 2013-12-29 DIAGNOSIS — C9000 Multiple myeloma not having achieved remission: Secondary | ICD-10-CM | POA: Insufficient documentation

## 2013-12-29 DIAGNOSIS — I059 Rheumatic mitral valve disease, unspecified: Secondary | ICD-10-CM | POA: Insufficient documentation

## 2013-12-29 DIAGNOSIS — Z79899 Other long term (current) drug therapy: Secondary | ICD-10-CM | POA: Insufficient documentation

## 2013-12-29 DIAGNOSIS — Z5181 Encounter for therapeutic drug level monitoring: Secondary | ICD-10-CM

## 2013-12-29 DIAGNOSIS — I480 Paroxysmal atrial fibrillation: Secondary | ICD-10-CM

## 2013-12-29 DIAGNOSIS — I079 Rheumatic tricuspid valve disease, unspecified: Secondary | ICD-10-CM | POA: Insufficient documentation

## 2013-12-29 DIAGNOSIS — N186 End stage renal disease: Secondary | ICD-10-CM | POA: Insufficient documentation

## 2013-12-29 HISTORY — PX: CARDIOVERSION: SHX1299

## 2013-12-29 HISTORY — PX: TEE WITHOUT CARDIOVERSION: SHX5443

## 2013-12-29 LAB — KAPPA/LAMBDA LIGHT CHAINS
KAPPA FREE LGHT CHN: 1.01 mg/dL (ref 0.33–1.94)
KAPPA LAMBDA RATIO: 0.03 — AB (ref 0.26–1.65)
LAMBDA FREE LGHT CHN: 38.1 mg/dL — AB (ref 0.57–2.63)

## 2013-12-29 LAB — PROTEIN ELECTROPHORESIS, SERUM
ALBUMIN ELP: 55.7 % — AB (ref 55.8–66.1)
ALPHA-1-GLOBULIN: 7.8 % — AB (ref 2.9–4.9)
Alpha-2-Globulin: 15.5 % — ABNORMAL HIGH (ref 7.1–11.8)
Beta 2: 9.1 % — ABNORMAL HIGH (ref 3.2–6.5)
Beta Globulin: 6.7 % (ref 4.7–7.2)
Gamma Globulin: 5.2 % — ABNORMAL LOW (ref 11.1–18.8)
M-Spike, %: 0.15 g/dL
Total Protein, Serum Electrophoresis: 4.8 g/dL — ABNORMAL LOW (ref 6.0–8.3)

## 2013-12-29 LAB — POCT INR: INR: 5.2

## 2013-12-29 SURGERY — CARDIOVERSION
Anesthesia: General

## 2013-12-29 MED ORDER — SODIUM CHLORIDE 0.9 % IV SOLN: INTRAVENOUS | Status: DC

## 2013-12-29 MED ORDER — MIDAZOLAM HCL 5 MG/5ML IJ SOLN
INTRAMUSCULAR | Status: DC | PRN
Start: 2013-12-29 — End: 2013-12-30
  Administered 2013-12-29: 2 mg via INTRAVENOUS

## 2013-12-29 MED ORDER — FENTANYL CITRATE 0.05 MG/ML IJ SOLN
INTRAMUSCULAR | Status: DC | PRN
  Administered 2013-12-29: 50 ug via INTRAVENOUS

## 2013-12-29 MED ORDER — SODIUM CHLORIDE 0.9 % IV SOLN
INTRAVENOUS | Status: DC | PRN
  Administered 2013-12-29: 12:00:00 via INTRAVENOUS

## 2013-12-29 MED ORDER — CARDIZEM CD 120 MG PO CP24: 120.0000 mg | ORAL_CAPSULE | Freq: Every day | ORAL | Status: DC

## 2013-12-29 MED ORDER — PROPOFOL 10 MG/ML IV BOLUS
INTRAVENOUS | Status: DC | PRN
  Administered 2013-12-29: 20 mg via INTRAVENOUS
  Administered 2013-12-29: 40 mg via INTRAVENOUS

## 2013-12-29 MED ORDER — METOPROLOL TARTRATE 50 MG PO TABS: 25.0000 mg | ORAL_TABLET | Freq: Two times a day (BID) | ORAL | Status: DC

## 2013-12-29 MED ORDER — GLYCOPYRROLATE 0.2 MG/ML IJ SOLN
INTRAMUSCULAR | Status: DC | PRN
  Administered 2013-12-29: 0.4 mg via INTRAVENOUS

## 2013-12-29 MED ORDER — HEPARIN SOD (PORK) LOCK FLUSH 100 UNIT/ML IV SOLN
500.0000 [IU] | INTRAVENOUS | Status: AC | PRN
  Administered 2013-12-29: 500 [IU]

## 2013-12-29 MED ORDER — PROPOFOL INFUSION 10 MG/ML OPTIME
INTRAVENOUS | Status: DC | PRN
  Administered 2013-12-29: 100 ug/kg/min via INTRAVENOUS

## 2013-12-29 NOTE — H&P (Signed)
Laura Hester  12/07/2013 10:15 AM   Office Visit  MRN:  [id number]   Description: 58 year old female  Provider: Josue Hector, MD  Department: [address] [facility]          Vital Signs Most recent update: 12/07/2013 10:33 AM by Burnett Kanaris, RMA      BP Pulse Ht Wt BMI LMP      124/68 113 _0  (1.575 m) 170 lb (77.111 kg) 31.09 kg/m2 04/20/2011        Vitals History Recorded                Progress Notes      Josue Hector, MD at 12/07/2013  8:56 AM      Status: Signed            Patient ID: Laura Hester, female   DOB: [birth year], 58 y.o.   MRN: [id number]    58 yo patient of [facility] oncology and Dr Jana Hakim.  She has multiple myeloma since 5/13.  Recently complicated by acute renal failure.  In July Cr as high as 5.8 Undergoing Rx with cyclophosphamide, dexamethasone.  She has been on ASA 325 mg .  She has atrial fibrillation. Echo 07/15/13 showed moderate to severe LAE   - Left ventricle: The cavity size was normal. Systolic function was vigorous. The estimated ejection fraction was in the range of 65% to 70%. Wall motion was normal; there were no regional wall motion abnormalities. - Left atrium: The atrium was moderately to severely dilated. - Atrial septum: No defect or patent foramen ovale was identified. - Pulmonary arteries: Systolic pressure was mildly increased. PA peak pressure: 72m Hg (S). - Line: A venous catheter was visualized in the superior vena cava, with its tip in the right atrium. No abnormal features noted.   She is getting dialysis M,W, F  She has been anemic and required transfusions but PLT count ok No bleeding issues.  Urine output decreases with chemo cycles Indicates possibility of amyloidosis.  Having more dyspnea and palpitations No chest pain      ROS: Denies fever, malais, weight loss, blurry vision, decreased visual acuity, cough, sputum, SOB, hemoptysis, pleuritic pain, palpitaitons, heartburn, abdominal pain,  melena, lower extremity edema, claudication, or rash.  All other systems reviewed and negative     General: Affect appropriate Chronically ill white female   HEENT: normal Neck supple with no adenopathy JVP normal no bruits no thyromegaly Lungs clear with no wheezing and good diaphragmatic motion Heart:  S1/S2 no murmur,rub, gallop or click PMI normal Abdomen: benighn, BS positve, no tenderness, no AAA no bruit.  No HSM or HJR Distal pulses intact with no bruits No edema Neuro non-focal Skin warm and dry No muscular weakness Port a cath right chest and triple lumen left    Medications Current Outpatient Prescriptions   Medication  Sig  Dispense  Refill   .  aspirin 325 MG tablet  Take 325 mg by mouth daily.         .  Cyclophosphamide 50 MG CAPS  Take 8 tablets by mouth once a week.         .Marland Kitchen dexamethasone (DECADRON) 4 MG tablet  10 tabs weekly         .  docusate sodium (COLACE) 100 MG capsule  Take 100 mg by mouth as needed for mild constipation. For  constipation         .  lidocaine-prilocaine (EMLA)  cream  Apply topically as needed.   30 g   3   .  Polyethylene Glycol 3350 (MIRALAX PO)  Take by mouth as needed. For constipation         .  pomalidomide (POMALYST) 4 MG capsule  Take 4 mg by mouth daily. Rest for 7         .  valACYclovir (VALTREX) 500 MG tablet  Take 500 mg by mouth every other day.             No current facility-administered medications for this visit.       Facility-Administered Medications Ordered in Other Visits   Medication  Dose  Route  Frequency  Provider  Last Rate  Last Dose   .  sodium chloride 0.9 % injection 10 mL   10 mL  Intracatheter  PRN  Amy G Berry, PA-C     10 mL at 05/30/13 1640   .  sodium chloride 0.9 % injection 10 mL   10 mL  Intravenous  PRN  Chauncey Cruel, MD     10 mL at 08/08/13 1518        Allergies Wellbutrin   Family History: Family History   Problem  Relation  Age of Onset   .  Heart disease  Father      .  Breast cancer  Mother     .  Heart disease  Mother     .  Diabetes  Paternal Grandfather     .  Hypertension  Maternal Grandmother     .  Hypertension  Brother          Social History: History       Social History   .  Marital Status:  Married       Spouse Name:  N/A       Number of Children:  2   .  Years of Education:  N/A       Occupational History   .  retired Pharmacist, hospital     .    [place] History Main Topics   .  Smoking status:  Never Smoker    .  Smokeless tobacco:  Never Used   .  Alcohol Use:  No   .  Drug Use:  No   .  Sexual Activity:  Not on file       Other Topics  Concern   .  Not on file       Social History Narrative   .  No narrative on file        Electrocardiogram:  ratpid afib nonspecifc ST changes  Rate 113   Assessment and Plan              A-fib - Josue Hector, MD at 12/07/2013 11:08 AM      Status: Written Related Problem: A-fib    At this point chronic.  Increased symptoms  Moderate to severe LAE poor prognostic sign for maint NSR.  Start lopressor 50 bid.  Start coumadin.  NOAC less appealing due to fluctuating renal function and risk of bleeding with low PLTls during chemo and inability to reverse newer agents.  Consider [facility] after 3 weeks of Rx INR  At least report of echo does not support amyloidosis of heart even if kidney biopsy positive.  Cannot have MRI with gadolinium to diagnose due to renal failure.  Will repeat echo post [facility] attempt  Patient not therapeutic on coumadin for 3 weeks; will try and arrange TEE/DCCV instead of DCCV. Kirk Ruths

## 2013-12-29 NOTE — Progress Notes (Signed)
Pt placed in recovery post TEE and attempted cardioversion. TEE showed severe MVP, and mild TR Post cardioversion attempt.  Pt was noted to have numerous runs of NSR with afib intermittently appearing, but heart unable to maintain consistent SR.  Stat EKG obtained with Dr. Stanford Breed at bedside..   Discussion of medication change and possibility of starting Amiodarone.  Pharmacist contacted by this nurse to advise Dr. Stanford Breed as to possible interactions or contraindications with chemo agents patient is presently taking.   Plan was developed to have patient wean Metoprolol and then transition to Cardiazem on Sun. Hoping the fatigue which started after 2nd dose of Metoprolol, was the result of the medication, and would improve.. If pt is no longer symptomatic without the beta blocker, she will remain on the Cardizem and then have the surgical issue with the MVP addressed. Patients d/c has been delayed while waiting for de access of port by IVT.

## 2013-12-29 NOTE — Op Note (Signed)
Skin abrasion under left breast from defib pad removal,  Petechiae present around abrasion under left breast

## 2013-12-29 NOTE — Progress Notes (Signed)
Discussed with patient; TEE shows normal LV function; moderate to severe LAE; moderate to severe MR; moderate TR. Attempts at [place] unsuccessful. She is significantly fatigued; possibly related to atrial fibrillation but could be a contribution from lopressor; wean metoprolol to off over next 2 days; then begin cardizem CD 120 mg daily; continue coumadin. FU with Dr Johnsie Cancel for further discussion and management. May need antiarrhythmic and repeat attempt at DCCV (would not be a candidate for amiodarone due to interaction with pomalyst; would not be a candidate for tikosyn or sotalol due to renal insufficiency). May need EP input. Not clear she would be a candidate for MVR with myeloma. Kirk Ruths

## 2013-12-29 NOTE — Anesthesia Postprocedure Evaluation (Signed)
  Anesthesia Post-op Note  Patient: Laura Hester  Procedure(s) Performed: Procedure(s): CARDIOVERSION (N/A) TRANSESOPHAGEAL ECHOCARDIOGRAM (TEE) (N/A)  Patient Location: PACU  Anesthesia Type:General  Level of Consciousness: awake and alert   Airway and Oxygen Therapy: Patient Spontanous Breathing  Post-op Pain: mild  Post-op Assessment: Post-op Vital signs reviewed, Patient's Cardiovascular Status Stable, Respiratory Function Stable and Patent Airway  Post-op Vital Signs: Reviewed and stable  Last Vitals:  Filed Vitals:   12/29/13 1350  BP: 124/58  Pulse: 139  Resp: 18    Complications: No apparent anesthesia complications

## 2013-12-29 NOTE — CV Procedure (Signed)
See full TEE report in [employer]; patient sedated by anesthesia with versed 2 mg, fentanyl 50 micrograms and diprovan 180 mg IV total; TEE showed no LAA thrombus; patient subsequently underwent DCCV with 120 J, 150J and 200 J x2; unsuccessful; pads repositioned and 5th attempt with 200 J unsuccessfu; continue coumadin and FU Dr Johnsie Cancel. Laura Hester

## 2013-12-29 NOTE — Telephone Encounter (Signed)
RELEASE FORM FAXED TO [facility] @ [phone] TO REQUESTED OFFICE NOTE FROM DR. VOORHEES.

## 2013-12-29 NOTE — Progress Notes (Addendum)
Echocardiogram Echocardiogram Transesophageal has been performed.  Joelene Millin 12/29/2013, 1:42 PM

## 2013-12-29 NOTE — Anesthesia Preprocedure Evaluation (Addendum)
Anesthesia Evaluation  Patient identified by MRN, date of birth, ID band Patient awake    Reviewed: Allergy & Precautions, H&P , NPO status , Patient's Chart, lab work & pertinent test results  Airway Mallampati: II      Dental  (+) Teeth Intact, Dental Advisory Given   Pulmonary  breath sounds clear to auscultation        Cardiovascular hypertension, Pt. on medications + dysrhythmias Atrial Fibrillation Rhythm:Irregular Rate:Normal  EF 4/15 55%   Neuro/Psych    GI/Hepatic   Endo/Other    Renal/GU Dialysis and ESRFRenal diseaseMultiple myeloma, suspect that etiology     Musculoskeletal   Abdominal (+)  Abdomen: soft.    Peds  Hematology  (+) anemia , Transfusion early Sept.   Anesthesia Other Findings   Reproductive/Obstetrics                          Anesthesia Physical Anesthesia Plan  ASA: III  Anesthesia Plan: General   Post-op Pain Management:    Induction: Intravenous  Airway Management Planned: Mask  Additional Equipment:   Intra-op Plan:   Post-operative Plan:   Informed Consent: I have reviewed the patients History and Physical, chart, labs and discussed the procedure including the risks, benefits and alternatives for the proposed anesthesia with the patient or authorized representative who has indicated his/her understanding and acceptance.     Plan Discussed with:   Anesthesia Plan Comments:         Anesthesia Quick Evaluation

## 2013-12-30 ENCOUNTER — Encounter (HOSPITAL_COMMUNITY): Payer: Self-pay | Admitting: Cardiology

## 2013-12-30 NOTE — Transfer of Care (Signed)
Immediate Anesthesia Transfer of Care Note  Patient: Laura Hester  Procedure(s) Performed: Procedure(s): CARDIOVERSION (N/A) TRANSESOPHAGEAL ECHOCARDIOGRAM (TEE) (N/A)  Patient Location: Endoscopy Unit  Anesthesia Type:General  Level of Consciousness: awake, alert , oriented and patient cooperative  Airway & Oxygen Therapy: Patient Spontanous Breathing and Patient connected to nasal cannula oxygen  Post-op Assessment: Report given to PACU RN, Post -op Vital signs reviewed and stable and Patient moving all extremities  Post vital signs: Reviewed and stable  Complications: No apparent anesthesia complications

## 2014-01-01 ENCOUNTER — Emergency Department (HOSPITAL_COMMUNITY): Payer: BC Managed Care – PPO

## 2014-01-01 ENCOUNTER — Encounter (HOSPITAL_COMMUNITY): Payer: Self-pay | Admitting: Emergency Medicine

## 2014-01-01 ENCOUNTER — Inpatient Hospital Stay (HOSPITAL_COMMUNITY)
Admission: EM | Admit: 2014-01-01 | Discharge: 2014-01-06 | DRG: 871 | Disposition: A | Discharge status: Home / Self Care | Payer: BC Managed Care – PPO | Attending: Internal Medicine | Admitting: Internal Medicine

## 2014-01-01 ENCOUNTER — Telehealth: Payer: Self-pay | Admitting: Nurse Practitioner

## 2014-01-01 DIAGNOSIS — C9000 Multiple myeloma not having achieved remission: Secondary | ICD-10-CM

## 2014-01-01 DIAGNOSIS — I059 Rheumatic mitral valve disease, unspecified: Secondary | ICD-10-CM | POA: Diagnosis present

## 2014-01-01 DIAGNOSIS — R918 Other nonspecific abnormal finding of lung field: Secondary | ICD-10-CM

## 2014-01-01 DIAGNOSIS — R7401 Elevation of levels of liver transaminase levels: Secondary | ICD-10-CM

## 2014-01-01 DIAGNOSIS — Z79899 Other long term (current) drug therapy: Secondary | ICD-10-CM

## 2014-01-01 DIAGNOSIS — I4819 Other persistent atrial fibrillation: Secondary | ICD-10-CM

## 2014-01-01 DIAGNOSIS — D630 Anemia in neoplastic disease: Secondary | ICD-10-CM | POA: Diagnosis present

## 2014-01-01 DIAGNOSIS — I4891 Unspecified atrial fibrillation: Secondary | ICD-10-CM | POA: Diagnosis present

## 2014-01-01 DIAGNOSIS — J189 Pneumonia, unspecified organism: Secondary | ICD-10-CM

## 2014-01-01 DIAGNOSIS — E639 Nutritional deficiency, unspecified: Secondary | ICD-10-CM | POA: Diagnosis present

## 2014-01-01 DIAGNOSIS — A419 Sepsis, unspecified organism: Principal | ICD-10-CM

## 2014-01-01 DIAGNOSIS — Z6827 Body mass index (BMI) 27.0-27.9, adult: Secondary | ICD-10-CM

## 2014-01-01 DIAGNOSIS — E559 Vitamin D deficiency, unspecified: Secondary | ICD-10-CM | POA: Diagnosis not present

## 2014-01-01 DIAGNOSIS — R740 Nonspecific elevation of levels of transaminase and lactic acid dehydrogenase [LDH]: Secondary | ICD-10-CM

## 2014-01-01 DIAGNOSIS — I482 Chronic atrial fibrillation, unspecified: Secondary | ICD-10-CM

## 2014-01-01 DIAGNOSIS — Z7901 Long term (current) use of anticoagulants: Secondary | ICD-10-CM

## 2014-01-01 DIAGNOSIS — E8589 Other amyloidosis: Secondary | ICD-10-CM | POA: Diagnosis present

## 2014-01-01 DIAGNOSIS — Z992 Dependence on renal dialysis: Secondary | ICD-10-CM | POA: Diagnosis not present

## 2014-01-01 DIAGNOSIS — R7402 Elevation of levels of lactic acid dehydrogenase (LDH): Secondary | ICD-10-CM | POA: Diagnosis present

## 2014-01-01 DIAGNOSIS — D899 Disorder involving the immune mechanism, unspecified: Secondary | ICD-10-CM | POA: Diagnosis present

## 2014-01-01 DIAGNOSIS — C9002 Multiple myeloma in relapse: Secondary | ICD-10-CM | POA: Diagnosis present

## 2014-01-01 DIAGNOSIS — N185 Chronic kidney disease, stage 5: Secondary | ICD-10-CM | POA: Diagnosis present

## 2014-01-01 DIAGNOSIS — J309 Allergic rhinitis, unspecified: Secondary | ICD-10-CM | POA: Diagnosis present

## 2014-01-01 DIAGNOSIS — E875 Hyperkalemia: Secondary | ICD-10-CM

## 2014-01-01 DIAGNOSIS — I509 Heart failure, unspecified: Secondary | ICD-10-CM | POA: Diagnosis present

## 2014-01-01 DIAGNOSIS — D709 Neutropenia, unspecified: Secondary | ICD-10-CM | POA: Diagnosis present

## 2014-01-01 DIAGNOSIS — Z9484 Stem cells transplant status: Secondary | ICD-10-CM

## 2014-01-01 DIAGNOSIS — D696 Thrombocytopenia, unspecified: Secondary | ICD-10-CM | POA: Diagnosis present

## 2014-01-01 DIAGNOSIS — N179 Acute kidney failure, unspecified: Secondary | ICD-10-CM | POA: Diagnosis present

## 2014-01-01 DIAGNOSIS — D61818 Other pancytopenia: Secondary | ICD-10-CM

## 2014-01-01 DIAGNOSIS — I120 Hypertensive chronic kidney disease with stage 5 chronic kidney disease or end stage renal disease: Secondary | ICD-10-CM | POA: Diagnosis present

## 2014-01-01 DIAGNOSIS — E663 Overweight: Secondary | ICD-10-CM | POA: Diagnosis present

## 2014-01-01 DIAGNOSIS — E889 Metabolic disorder, unspecified: Secondary | ICD-10-CM

## 2014-01-01 DIAGNOSIS — I43 Cardiomyopathy in diseases classified elsewhere: Secondary | ICD-10-CM

## 2014-01-01 DIAGNOSIS — N186 End stage renal disease: Secondary | ICD-10-CM

## 2014-01-01 LAB — CBC
HCT: 23.4 % — ABNORMAL LOW (ref 36.0–46.0)
HEMOGLOBIN: 7.7 g/dL — AB (ref 12.0–15.0)
MCH: 30 pg (ref 26.0–34.0)
MCHC: 32.9 g/dL (ref 30.0–36.0)
MCV: 91.1 fL (ref 78.0–100.0)
PLATELETS: 87 10*3/uL — AB (ref 150–400)
RBC: 2.57 MIL/uL — ABNORMAL LOW (ref 3.87–5.11)
RDW: 20.1 % — AB (ref 11.5–15.5)
WBC: 2 10*3/uL — ABNORMAL LOW (ref 4.0–10.5)

## 2014-01-01 LAB — BASIC METABOLIC PANEL
ANION GAP: 17 — AB (ref 5–15)
BUN: 60 mg/dL — ABNORMAL HIGH (ref 6–23)
CALCIUM: 6.9 mg/dL — AB (ref 8.4–10.5)
CO2: 23 mEq/L (ref 19–32)
Chloride: 95 mEq/L — ABNORMAL LOW (ref 96–112)
Creatinine, Ser: 5.68 mg/dL — ABNORMAL HIGH (ref 0.50–1.10)
GFR, EST AFRICAN AMERICAN: 9 mL/min — AB (ref >90)
GFR, EST NON AFRICAN AMERICAN: 8 mL/min — AB (ref >90)
Glucose, Bld: 106 mg/dL — ABNORMAL HIGH (ref 70–99)
POTASSIUM: 5.9 meq/L — AB (ref 3.7–5.3)
SODIUM: 135 meq/L — AB (ref 137–147)

## 2014-01-01 LAB — PROTIME-INR
INR: 1.89 — AB (ref 0.00–1.49)
PROTHROMBIN TIME: 21.7 s — AB (ref 11.6–15.2)

## 2014-01-01 LAB — DIFFERENTIAL
BASOS ABS: 0.1 10*3/uL (ref 0.0–0.1)
BASOS PCT: 4 % — AB (ref 0–1)
Band Neutrophils: 16 % — ABNORMAL HIGH (ref 0–10)
Blasts: 0 %
EOS ABS: 0 10*3/uL (ref 0.0–0.7)
Eosinophils Relative: 1 % (ref 0–5)
Lymphocytes Relative: 9 % — ABNORMAL LOW (ref 12–46)
Lymphs Abs: 0.2 10*3/uL — ABNORMAL LOW (ref 0.7–4.0)
METAMYELOCYTES PCT: 2 %
MYELOCYTES: 0 %
Monocytes Absolute: 0 10*3/uL — ABNORMAL LOW (ref 0.1–1.0)
Monocytes Relative: 1 % — ABNORMAL LOW (ref 3–12)
NRBC: 0 /100{WBCs}
Neutro Abs: 1.7 10*3/uL (ref 1.7–7.7)
Neutrophils Relative %: 67 % (ref 43–77)
Promyelocytes Absolute: 0 %
WBC Morphology: INCREASED

## 2014-01-01 LAB — TROPONIN I
Troponin I: <0.3 ng/mL (ref <0.30)
Troponin I: <0.3 ng/mL (ref <0.30)

## 2014-01-01 LAB — PRO B NATRIURETIC PEPTIDE: Pro B Natriuretic peptide (BNP): >70000 pg/mL — ABNORMAL HIGH (ref 0–125)

## 2014-01-01 LAB — I-STAT TROPONIN, ED: TROPONIN I, POC: 0.33 ng/mL — AB (ref 0.00–0.08)

## 2014-01-01 LAB — MRSA PCR SCREENING: MRSA by PCR: NEGATIVE

## 2014-01-01 LAB — I-STAT CG4 LACTIC ACID, ED: Lactic Acid, Venous: 1.59 mmol/L (ref 0.5–2.2)

## 2014-01-01 MED ORDER — VANCOMYCIN HCL 10 G IV SOLR
1500.0000 mg | Freq: Once | INTRAVENOUS | Status: AC
  Administered 2014-01-01: 1500 mg via INTRAVENOUS
  Filled 2014-01-01: qty 1500

## 2014-01-01 MED ORDER — VALACYCLOVIR HCL 500 MG PO TABS
500.0000 mg | ORAL_TABLET | ORAL | Status: DC
  Administered 2014-01-01 – 2014-01-05 (×3): 500 mg via ORAL
  Filled 2014-01-01 (×3): qty 1

## 2014-01-01 MED ORDER — SODIUM CHLORIDE 0.9 % IV BOLUS (SEPSIS): 30.0000 mL/kg | Freq: Once | INTRAVENOUS | Status: DC

## 2014-01-01 MED ORDER — LIDOCAINE HCL (PF) 1 % IJ SOLN: 5.0000 mL | INTRAMUSCULAR | Status: DC | PRN

## 2014-01-01 MED ORDER — ACETAMINOPHEN 650 MG RE SUPP: 650.0000 mg | Freq: Four times a day (QID) | RECTAL | Status: DC | PRN

## 2014-01-01 MED ORDER — ACETAMINOPHEN 325 MG PO TABS
650.0000 mg | ORAL_TABLET | Freq: Four times a day (QID) | ORAL | Status: DC | PRN
  Administered 2014-01-02 – 2014-01-04 (×3): 650 mg via ORAL
  Filled 2014-01-01 (×3): qty 2

## 2014-01-01 MED ORDER — SODIUM CHLORIDE 0.9 % IV SOLN: 250.0000 mL | INTRAVENOUS | Status: DC | PRN

## 2014-01-01 MED ORDER — DILTIAZEM HCL ER COATED BEADS 120 MG PO CP24
120.0000 mg | ORAL_CAPSULE | Freq: Every day | ORAL | Status: DC
  Filled 2014-01-01: qty 1

## 2014-01-01 MED ORDER — DILTIAZEM HCL 60 MG PO TABS
60.0000 mg | ORAL_TABLET | Freq: Four times a day (QID) | ORAL | Status: DC
  Administered 2014-01-01: 60 mg via ORAL
  Filled 2014-01-01 (×6): qty 1

## 2014-01-01 MED ORDER — SODIUM CHLORIDE 0.9 % IJ SOLN: 3.0000 mL | Freq: Two times a day (BID) | INTRAMUSCULAR | Status: DC

## 2014-01-01 MED ORDER — SODIUM CHLORIDE 0.9 % IV SOLN: 1000.0000 mL | INTRAVENOUS | Status: DC

## 2014-01-01 MED ORDER — DEXTROSE 5 % IV SOLN
1.0000 g | Freq: Once | INTRAVENOUS | Status: AC
  Administered 2014-01-01: 1 g via INTRAVENOUS
  Filled 2014-01-01: qty 1

## 2014-01-01 MED ORDER — ONDANSETRON HCL 4 MG PO TABS: 4.0000 mg | ORAL_TABLET | Freq: Four times a day (QID) | ORAL | Status: DC | PRN

## 2014-01-01 MED ORDER — WARFARIN - PHARMACIST DOSING INPATIENT
Freq: Every day | Status: DC
  Administered 2014-01-03: 18:00:00

## 2014-01-01 MED ORDER — SODIUM CHLORIDE 0.9 % IV BOLUS (SEPSIS)
500.0000 mL | Freq: Once | INTRAVENOUS | Status: AC
  Administered 2014-01-01: 500 mL via INTRAVENOUS

## 2014-01-01 MED ORDER — VANCOMYCIN HCL IN DEXTROSE 750-5 MG/150ML-% IV SOLN
750.0000 mg | Freq: Once | INTRAVENOUS | Status: AC
  Administered 2014-01-01: 750 mg via INTRAVENOUS
  Filled 2014-01-01: qty 150

## 2014-01-01 MED ORDER — HEPARIN SODIUM (PORCINE) 1000 UNIT/ML DIALYSIS
1000.0000 [IU] | INTRAMUSCULAR | Status: DC | PRN
  Filled 2014-01-01: qty 1

## 2014-01-01 MED ORDER — SODIUM CHLORIDE 0.9 % IV SOLN: 100.0000 mL | INTRAVENOUS | Status: DC | PRN

## 2014-01-01 MED ORDER — SODIUM CHLORIDE 0.9 % IJ SOLN
3.0000 mL | Freq: Two times a day (BID) | INTRAMUSCULAR | Status: DC
  Administered 2014-01-01: 3 mL via INTRAVENOUS

## 2014-01-01 MED ORDER — SODIUM CHLORIDE 0.9 % IV SOLN
100.0000 mL | INTRAVENOUS | Status: DC | PRN
Start: 2014-01-01 — End: 2014-01-02

## 2014-01-01 MED ORDER — SODIUM CHLORIDE 0.9 % IJ SOLN: 3.0000 mL | INTRAMUSCULAR | Status: DC | PRN

## 2014-01-01 MED ORDER — HYDROCODONE-ACETAMINOPHEN 5-325 MG PO TABS: 1.0000 | ORAL_TABLET | ORAL | Status: DC | PRN

## 2014-01-01 MED ORDER — PENTAFLUOROPROP-TETRAFLUOROETH EX AERO: 1.0000 "application " | INHALATION_SPRAY | CUTANEOUS | Status: DC | PRN

## 2014-01-01 MED ORDER — SODIUM CHLORIDE 0.9 % IV SOLN: Freq: Once | INTRAVENOUS | Status: DC

## 2014-01-01 MED ORDER — WARFARIN SODIUM 5 MG PO TABS
5.0000 mg | ORAL_TABLET | Freq: Once | ORAL | Status: AC
  Administered 2014-01-01: 5 mg via ORAL
  Filled 2014-01-01: qty 1

## 2014-01-01 MED ORDER — ONDANSETRON HCL 4 MG/2ML IJ SOLN
4.0000 mg | Freq: Four times a day (QID) | INTRAMUSCULAR | Status: DC | PRN
  Administered 2014-01-05 (×2): 4 mg via INTRAVENOUS
  Filled 2014-01-01 (×3): qty 2

## 2014-01-01 MED ORDER — LIDOCAINE-PRILOCAINE 2.5-2.5 % EX CREA
1.0000 "application " | TOPICAL_CREAM | CUTANEOUS | Status: DC | PRN
  Filled 2014-01-01: qty 5

## 2014-01-01 MED ORDER — ALTEPLASE 2 MG IJ SOLR
2.0000 mg | Freq: Once | INTRAMUSCULAR | Status: AC | PRN
  Filled 2014-01-01: qty 2

## 2014-01-01 MED ORDER — NEPRO/CARBSTEADY PO LIQD: 237.0000 mL | ORAL | Status: DC | PRN

## 2014-01-01 NOTE — Consult Note (Signed)
CARDIOLOGY CONSULT NOTE   Patient ID: Laura Hester MRN: [id number], DOB/AGE: November 15, 1955   Admit date: 01/01/2014 Date of Consult: 01/01/2014   Primary Physician: Gara Kroner, MD Primary Cardiologist: Dr. Johnsie Cancel  Pt. Profile  58 year old woman with multiple myeloma and renal failure who presents with increasing shortness of breath and weakness.  Problem List  Past Medical History  Diagnosis Date  . Fibrocystic breast disease   . Allergic rhinitis   . HTN (hypertension)     mild/ observation only  . Prediabetes   . SUI (stress urinary incontinence, female)   . Overweight (BMI 25.0-29.9)   . Multiple myeloma, stage 3 08/20/11 dx  . Anemia     resolved  . Cancer   . Arthritis   . Acute renal failure (ARF) 10/24/2013    Past Surgical History  Procedure Laterality Date  . Cholecystectomy  1995  . Tonsillectomy and adenoidectomy    . Carpal tunnel release    . Cardioversion N/A 12/29/2013    Procedure: CARDIOVERSION;  Surgeon: Lelon Perla, MD;  Location: [facility] ENDOSCOPY;  Service: Cardiovascular;  Laterality: N/A;  . Tee without cardioversion N/A 12/29/2013    Procedure: TRANSESOPHAGEAL ECHOCARDIOGRAM (TEE);  Surgeon: Lelon Perla, MD;  Location: [facility] ENDOSCOPY;  Service: Cardiovascular;  Laterality: N/A;     Allergies  Allergies  Allergen Reactions  . Wellbutrin [Bupropion Hcl] Hives    HPI   This 58 year old woman presents with atrial fibrillation and fluid overload and shortness of breath.  Chest x-ray shows a dense left upper lobe pneumonia.  She has a history of having had multiple myeloma since May 2013.  More recently she has had renal failure and has been on dialysis.  She is on chemotherapy and is followed at [facility] by Dr. Melba Coon.  She was hospitalized for about a month in July 2015 at [facility] .  While she was there she was told that she was having intermittent episodes of atrial fibrillation.  She was initially on aspirin.  She had an  echocardiogram 07/15/13 which showed moderate to severe left atrial enlargement.  She has normal left ventricular systolic function with ejection fraction 65-70%.  She was seen by Dr. admission in the office on 12/07/13.  No murmur of mitral regurgitation was noted at that time.  The possibility of amyloidosis was entertained.  She was placed on warfarin and after 3 weeks she underwent attempted cardioversion on 12/29/13 after TEE.  The TEE showed no thrombus and was negative for patent foramen ovale.  There was moderate TR.  Ejection fraction was 55%.  There was severe mitral regurgitation.  The patient had 5 attempts at [place] with energy levels up to 200 J.  However the attempt at cardioversion was unsuccessful.  Since discharge from the hospital the patient has continued to feel weak and dyspneic.  Her husband called today and was advised to bring her to the emergency room for evaluation.  In the emergency room she was found to be in atrial fibrillation with heart rate of 110 and her chest x-ray shows normal heart size and a left upper lobe pneumonia.  Inpatient Medications  . warfarin  5 mg Oral ONCE-1800  . Warfarin - Pharmacist Dosing Inpatient   Does not apply q1800    Family History Family History  Problem Relation Age of Onset  . Heart disease Father   . Breast cancer Mother   . Heart disease Mother   . Diabetes Paternal Grandfather   .  Hypertension Maternal Grandmother   . Hypertension Brother      Social History History   Social History  . Marital Status: Married    Spouse Name: N/A    Number of Children: 2  . Years of Education: N/A   Occupational History  . retired Pharmacist, hospital   .  [place] History Main Topics  . Smoking status: Never Smoker   . Smokeless tobacco: Never Used  . Alcohol Use: No  . Drug Use: No  . Sexual Activity: Not on file   Other Topics Concern  . Not on file   Social History Narrative  . No narrative on file     Review of  Systems  General:  No chills, fever, night sweats or weight changes.  Cardiovascular:  Positive for dyspnea and peripheral edema and irregular pulse Dermatological: No rash, lesions/masses Respiratory: No cough, dyspnea Urologic: No hematuria, dysuria Abdominal:   No nausea, vomiting, diarrhea, bright red blood per rectum, melena, or hematemesis Neurologic:  No visual changes, wkns, changes in mental status. All other systems reviewed and are otherwise negative except as noted above.  Physical Exam  Blood pressure 128/78, pulse 109, temperature 100.1 F (37.8 C), temperature source Oral, resp. rate 16, height 5' 2"  (1.575 m), weight 155 lb (70.308 kg), last menstrual period 04/20/2011, SpO2 100.00%.  General: Pleasant, NAD.  She is pale Psych: Normal affect. Neuro: Alert and oriented X 3. Moves all extremities spontaneously. HEENT: Normal  Neck: Supple without bruits or JVD. Lungs:  Inspiratory rales in left chest Heart: Irregularly irregular rhythm. no s3, s4, and there is a grade 3/6 holosystolic apical murmur of mitral regurgitation Abdomen: Soft, non-tender, non-distended, BS + x 4.  Extremities:  3+ soft pitting edema.   DP/PT/Radials 2+ and equal bilaterally.  Labs  No results found for this basename: CKTOTAL, CKMB, TROPONINI,  in the last 72 hours Lab Results  Component Value Date   WBC 2.0* 01/01/2014   HGB 7.7* 01/01/2014   HCT 23.4* 01/01/2014   MCV 91.1 01/01/2014   PLT 87* 01/01/2014     Recent Labs Lab 12/27/13 0858 01/01/14 1356  NA 139 135*  K 4.1 5.9*  CL  --  95*  CO2 28 23  BUN 25.1 60*  CREATININE 4.1* 5.68*  CALCIUM 7.2* 6.9*  PROT 5.1*  --   BILITOT 0.58  --   ALKPHOS 80  --   ALT 19  --   AST 17  --   GLUCOSE 132 106*   No results found for this basename: CHOL,  HDL,  LDLCALC,  TRIG   No results found for this basename: DDIMER    Radiology/Studies  Dg Chest Port 1 View  01/01/2014   CLINICAL DATA:  Air on 12/29/2013 for cardiac ablation  was unsuccessful. Since patient has been having fatigue.  EXAM: PORTABLE CHEST - 1 VIEW  COMPARISON:  09/15/2011.  07/08/2013.  FINDINGS: There is left upper lobe consolidation with milder opacity in the left lower lobe at the lung base partly silhouetting the hemidiaphragm. There is vascular prominence bilaterally, but no other areas of consolidation. Small left effusion is suspected. No pneumothorax.  Right anterior chest wall power Port-A-Cath has its tip at the caval atrial junction. Left sided dual-lumen central venous catheter has its distal tip in the right atrium.  Cardiac silhouette is normal in size. No mediastinal or convincing hilar masses.  Few subtle lucent lesions are noted in the proximal left humerus. These  are stable. Patient reportedly has multiple myeloma based on a prior bone survey. Skeletal structures are diffusely demineralized.  IMPRESSION: 1. Left upper lobe consolidation consistent with pneumonia. Milder opacity at the left lung base which may reflect additional pneumonia or be due to atelectasis, likely with a small associated pleural effusion. 2. No other acute findings.   Electronically Signed   By: Lajean Manes M.D.   On: 01/01/2014 14:33    ECG  EKG shows low voltage.  Rhythm is atrial fibrillation.  No acute ischemic changes.  ASSESSMENT AND PLAN  1. persistent atrial fibrillation, status post unsuccessful attempt at direct current cardioversion on 12/29/13 x5. 2. significant mitral regurgitation of recent onset 3. left upper lobe pneumonia 4. 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 severe anemia 6. multiple myeloma  Plan: At this point we will aim for rate control of her atrial fibrillation.  Her heart rate at the present time his only mildly elevated and she is not in any acute distress from her atrial fibrillation.  She felt that the beta blocker made her feel weak and so we will use diltiazem for rate control.  She has normal left ventricular function.  We will  continue warfarin for anticoagulation. Nephrology will dialyze her tonight and also transfuse her.  She is significantly volume overloaded at the present time.  Rate control will be easier when her hemoglobin is higher. Will follow with you.   Signed, Darlin Coco, MD  01/01/2014, 5:21 PM

## 2014-01-01 NOTE — Progress Notes (Signed)
Patient ID: Laura Hester, female   DOB: [birth year], 58 y.o.   MRN: [id number] Please have EP service see patient in am After failed [facility] discussed antiarrhythmic RX with Dr Autumn Messing use amiodarone due to interaction with pomolyst Renal failure precludes tikosyn  ? Use flecainide at a dose of 100 mg daily with close monitoring of trough levels initial level after first 5 doses  Not clear why MR has worsened since April although I think her overall clinical symptoms are more related to  Her advanced myeloma, anemia renal failure and now pneumonia  Jenkins Rouge

## 2014-01-01 NOTE — ED Notes (Addendum)
She was here Thursday for cardioversion that was unsuccessful. she was discharged home with a change of mediations and has felt progressively worse since. She c/o fatigue, dry heaves, sob. She is currently being treated for multiple myeloma. She has a porta cath and would like blood drawn from it. She is febrile today and states that is new.

## 2014-01-01 NOTE — Telephone Encounter (Signed)
Pts husband called today stating that since attempted cardioversion on Thursday, Laura Hester has become progressively weak and this morning is dyspneic.  I recommended that he bring her into the ED for evaluation today.  He verbalized understanding.

## 2014-01-01 NOTE — Progress Notes (Addendum)
ANTIBIOTIC CONSULT NOTE - INITIAL  Pharmacy Consult for vancomycin/cefepime/couamdin  Indication: pneumonia and afib  Allergies  Allergen Reactions  . Wellbutrin [Bupropion Hcl] Hives    Patient Measurements: Height: _0  (157.5 cm) Weight: 155 lb (70.308 kg) IBW/kg (Calculated) : 50.1  Vital Signs: Temp: 100.1 F (37.8 C) (09/20 1527) Temp src: Oral (09/20 1527) BP: 109/60 mmHg (09/20 1527) Pulse Rate: 99 (09/20 1527) Intake/Output from previous day:   Intake/Output from this shift:    Labs:  Recent Labs  01/01/14 1356  WBC 2.0*  HGB 7.7*  PLT 87*  CREATININE 5.68*   Estimated Creatinine Clearance: 10 ml/min (by C-G formula based on Cr of 5.68). No results found for this basename: VANCOTROUGH, VANCOPEAK, VANCORANDOM, GENTTROUGH, GENTPEAK, GENTRANDOM, TOBRATROUGH, TOBRAPEAK, TOBRARND, AMIKACINPEAK, AMIKACINTROU, AMIKACIN,  in the last 72 hours   Microbiology: No results found for this or any previous visit (from the past 720 hour(s)).  Medical History: Past Medical History  Diagnosis Date  . Fibrocystic breast disease   . Allergic rhinitis   . HTN (hypertension)     mild/ observation only  . Prediabetes   . SUI (stress urinary incontinence, female)   . Overweight (BMI 25.0-29.9)   . Multiple myeloma, stage 3 08/20/11 dx  . Anemia     resolved  . Cancer   . Arthritis   . Acute renal failure (ARF) 10/24/2013   Assessment: ID: 58 y.o. female with multiple medical problems multiple myeloma, ESRD on HD MWF, A fib on coumadin, who presented with SOB, fever, palpitation. Pharmacy is consulted to start vancomycin for pneumonia. She also received cefepime 1g while in the ED at 1530 today, likely she will receive HD this evening.   9/20: blood cx x 2 -   Anticoagulation: Pt is on coumadin PTA for afib, per coumadin clinic note on 9/17, INR was 5.2 on 31m daily, and instructed pt to skip a dose and take 2.5 on 9/18 and resume 51mdose from 9/19. Per pt, she took  exactly as how she was took, and she hasn't taken today's dose yet. INR 1.89 today.   Goal of Therapy:  INR 2-3 Pre-HD Vancomycin level 15-25 mcg/ml  Plan:  - Vancomycin loading dose 1500 mg IV x 2 - f/u HD plan and order vancomycin 750 mg and cefepime 2g Q HD - Vancomycin level at steady state. - Coumadin 5 mg po x 1 - Daily PT/INR  MeMaryanna ShapePharmD, BCPS  Clinical Pharmacist  Pager: [phone] 01/01/2014,4:14 PM

## 2014-01-01 NOTE — Consult Note (Addendum)
Requesting Physician:  Dr. Tyrell Antonio Reason for Consult:  Dialysis and provision of renal failure related services HPI: The patient is a 58 y.o. 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 Jana Hakim locally and [facility] in [place]), renal failure on MWF dialysis at [facility] (unable to access records from that unit today - she is not sure what her EDW is but is able to provide other details about her dialysis), atrial fibrillation on warfarin (s/p failed attempt at D/C cardioversion 12/29/13) and a new (to them) dx of moderate to severe mitral regurgitation/TR/LAE with normal LV function by ECHO. Since her attempted cardioversion on Thursday she noted progressive weakness, DOE, cough but no sputum production, chills for 2 days, and fever.  She presented to the ED where she was found to have K 5.9, Hb 7.7, WBC 2000, and CXR with LUL consolidation and ? LLL process as well.  She is admitted for treatment of pneumonia and we are asked to see to provide dialysis.   She reports being compliant with HD but says there has been difficulty establishing her EDW. She has recently had more in the way of LE edema. She dialyzes through a left sided TDC. Does not receive heparin.  She had dialysis initiated in [place] now receives at [place]. 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 She has followed in the office with Dr. Joelyn Oms and there has been some hope for renal recovery by Dr. Joelyn Oms and Dr. Melba Coon.  I presume this is why there is no permanent access in place.    Past Medical History  Diagnosis Date  . Fibrocystic breast disease   . Allergic rhinitis   . HTN (hypertension)     mild/ observation only  . Prediabetes   . SUI (stress urinary incontinence, female)   . Overweight (BMI 25.0-29.9)   . Multiple  myeloma, stage 3 08/20/11 dx  . Anemia     resolved  . Cancer   . Arthritis   . Acute renal failure (ARF) 10/24/2013    Past Surgical History  Procedure Laterality Date  . Cholecystectomy  1995  . Tonsillectomy and adenoidectomy    . Carpal tunnel release    . Cardioversion N/A 12/29/2013    Procedure: CARDIOVERSION;  Surgeon: Lelon Perla, MD;  Location: [facility] ENDOSCOPY;  Service: Cardiovascular;  Laterality: N/A;  . Tee without cardioversion N/A 12/29/2013    Procedure: TRANSESOPHAGEAL ECHOCARDIOGRAM (TEE);  Surgeon: Lelon Perla, MD;  Location: [facility] ENDOSCOPY;  Service: Cardiovascular;  Laterality: N/A;   Family History  Problem Relation Age of Onset  . Heart disease Father   . Breast cancer Mother   . Heart disease Mother   . Diabetes Paternal Grandfather   . Hypertension Maternal Grandmother   . Hypertension Brother    Social History:  reports that she has never smoked. She has never used smokeless tobacco. She reports that she does not drink alcohol or use illicit drugs.Retired Oncologist. Married for 33 years.   Allergies  Allergen Reactions  . Wellbutrin [Bupropion Hcl] Hives    Home medications: Prior to Admission medications   Medication Sig Start Date End Date Taking? Authorizing Provider  B Complex-C-Biotin-E-Min-FA (DIALYVITE 5000 PO) Take 1 tablet by mouth daily.    Historical Provider, MD  CARDIZEM CD 120 MG 24 hr capsule  Take 1 capsule (120 mg total) by mouth daily. 01/01/14   Lelon Perla, MD  Cyclophosphamide 50 MG CAPS Take 8 tablets by mouth once a week. 11/24/13   Historical Provider, MD  dexamethasone (DECADRON) 4 MG tablet 10 tabs weekly 09/29/13   Historical Provider, MD  lidocaine-prilocaine (EMLA) cream Apply topically as needed. 04/04/13   Chauncey Cruel, MD  metoprolol (LOPRESSOR) 50 MG tablet Take 0.5 tablets (25 mg total) by mouth 2 (two) times daily. 12/29/13 01/01/14  Lelon Perla, MD  pomalidomide (POMALYST) 4 MG capsule Take  4 mg by mouth daily. Take with water on days 1-21. Repeat every 28 days.    Historical Provider, MD  valACYclovir (VALTREX) 500 MG tablet Take 500 mg by mouth every other day. 11/23/13 01/22/14  Historical Provider, MD  warfarin (COUMADIN) 5 MG tablet Take 1 tablet daily or as directed by coumadin clinic 12/07/13   Josue Hector, MD   Medications - orders pending by admitting service  . sodium chloride   Intravenous Once  . vancomycin  1,500 mg Intravenous Once  . warfarin  5 mg Oral ONCE-1800  . Warfarin - Pharmacist Dosing Inpatient   Does not apply q1800      Review of Systems Gen:  + chills, fevers. No sputum production. Pain and "burn under left breast from cardioversion attempt HEENT:  No visual change, sore throat, difficulty swallowing. Cardiores/Resp: + DOE  GI:   Denies abdominal pain.   No nausea, vomiting, diarrhea.  No constipation. GU:  Denies difficulty or change in voiding.  No change in urine color.     MS:  Denies joint pain or swelling.   Derm:  Denies skin rash or itching.  No chronic skin conditions.  Neuro:   Denies focal weakness, memory problems, hx stroke or TIA.   Psych:  Denies symptoms of depression of anxiety.  No hallucination.  MSK: worsening lower extremity edema past few days despite receiving her HD treatments   Physical Exam:  Blood pressure 109/60, pulse 99, temperature 100.1 F (37.8 C), temperature source Oral, resp. rate 16, height 5' 2"  (1.575 m), weight 70.308 kg (155 lb), last menstrual period 04/20/2011, SpO2 98.00%.  Gen: Pale and somewhat dyspneic app WF VS as noted TDC left chest and portacath right chest Skin: no rash, cyanosis; ecchymotic vs burned areas under left breast from attempted cardioversion Neck: JVD 5 cm Chest: Right side with few base crackles only. Left with diffuse crackles left post/lower lung fields without wheezes Heart: Irreg irreg S1S2 No S3  1/6 murmur apex No diastolic murmur Abdomen: soft, without focal  tenderness Ext: 3+ edema to the hips Neuro: alert, Ox3, no focal deficit  Labs: Basic Metabolic Panel:  Recent Labs Lab 12/27/13 0858 01/01/14 1356  NA 139 135*  K 4.1 5.9*  CL  --  95*  CO2 28 23  GLUCOSE 132 106*  BUN 25.1 60*  CREATININE 4.1* 5.68*  CALCIUM 7.2* 6.9*    Recent Labs Lab 12/27/13 0858  AST 17  ALT 19  ALKPHOS 80  BILITOT 0.58  PROT 5.1*  ALBUMIN 2.6*   Recent Labs Lab 12/27/13 0858 01/01/14 1356  WBC 3.0* 2.0*  NEUTROABS 1.4* 1.7  HGB 9.2* 7.7*  HCT 28.6* 23.4*  MCV 91.1 91.1  PLT 214 87*    Xrays/Other Studies: Dg Chest Port 1 View  01/01/2014   CLINICAL DATA:  Air on 12/29/2013 for cardiac ablation was unsuccessful. Since patient has been having fatigue.  EXAM: PORTABLE CHEST - 1 VIEW  COMPARISON:  09/15/2011.  07/08/2013.  FINDINGS: There is left upper lobe consolidation with milder opacity in the left lower lobe at the lung base partly silhouetting the hemidiaphragm. There is vascular prominence bilaterally, but no other areas of consolidation. Small left effusion is suspected. No pneumothorax.  Right anterior chest wall power Port-A-Cath has its tip at the caval atrial junction. Left sided dual-lumen central venous catheter has its distal tip in the right atrium.  Cardiac silhouette is normal in size. No mediastinal or convincing hilar masses.  Few subtle lucent lesions are noted in the proximal left humerus. These are stable. Patient reportedly has multiple myeloma based on a prior bone survey. Skeletal structures are diffusely demineralized.  IMPRESSION: 1. Left upper lobe consolidation consistent with pneumonia. Milder opacity at the left lung base which may reflect additional pneumonia or be due to atelectasis, likely with a small associated pleural effusion. 2. No other acute findings.   Electronically Signed   By: Lajean Manes M.D.   On: 01/01/2014 14:33   Dialysis Prescription: outpt records N/A Per pt: 3.5 hours, no heparin, unsure of  EDW, no Aranesp (per direction of [facility] oncology) Vit D or iron dosing not known  Impression/Plan  1. 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 No access to records for HD details but needs tonight for volume removal, and to allow safe administration of blood products given volume overload status and hyperkalemia. She may ultimately need more than 3.5 hours of dialysis. HD tonight, 2K bath, 2 units PRBC's, 3 liter goal. She has seen Dr. Joelyn Oms in the office at [facility] and on review of his notes there has been some optimism re: possibiilty of renal recovery so has not been called  "ESRD" as of yet that I can tell.   2. LUL/LLL PNA - Antibiotics per the primary service 3. Lambda light chain multiple myeloma, prior stem cell transplant, currently on chemo - ? Oncology to see while here - ? If should hold chemo in face of acute infection. (Dr.Magrinat follows locally) 4. Anemia - For 2 units PRBC's tonight with HD. Iron status unknown. She states Dr. Melba Coon in [place] does not want her to receive procrit or Aranesp 5. Thrombocytopenia - plts 87 K (175 on 9/1 - ? Chemo vs sepsis related (does not get heparin) 6. Atrial fibrillation - on warfarin and BB - cards had planned change to dilt today. Dr. Mare Ferrari to see tonight.  Fluid removal becomes difficult as BP lowering meds for rate control are used 7. Mitral regurg mod to severe and tricuspid regurg - by ECHO 9/17. Per pt and husband new Dx. Cards f/u.   Jamal Maes,  MD [facility] [phone] pager 01/01/2014, 5:07 PM

## 2014-01-01 NOTE — H&P (Addendum)
[facility] History and Physical  Laura Hester GEX:[id number] DOB: Feb 21, 1956 DOA: 01/01/2014  Referring physician: Dr Stevie Kern.  PCP: Gara Kroner, MD   Chief Complaint:   HPI: Laura Hester is a 58 y.o. female with multiple medical problems multiple myeloma, on dialysis, A fib on coumadin, recent cardioversion who presents with progressive SOB on exertion, palpitation, fever,. She also developed cough, day prior to admission. She was found to have a tempeture at 101 in the ED. She is on Pomalidomide take it for 21 day then rest for 7 days. She denies chest pain. She does relates nausea.    Review of Systems:  Negative, except as per HPI.   Past Medical History  Diagnosis Date  . Fibrocystic breast disease   . Allergic rhinitis   . HTN (hypertension)     mild/ observation only  . Prediabetes   . SUI (stress urinary incontinence, female)   . Overweight (BMI 25.0-29.9)   . Multiple myeloma, stage 3 08/20/11 dx  . Anemia     resolved  . Cancer   . Arthritis   . Acute renal failure (ARF) 10/24/2013   Past Surgical History  Procedure Laterality Date  . Cholecystectomy  1995  . Tonsillectomy and adenoidectomy    . Carpal tunnel release    . Cardioversion N/A 12/29/2013    Procedure: CARDIOVERSION;  Surgeon: Lelon Perla, MD;  Location: [facility] ENDOSCOPY;  Service: Cardiovascular;  Laterality: N/A;  . Tee without cardioversion N/A 12/29/2013    Procedure: TRANSESOPHAGEAL ECHOCARDIOGRAM (TEE);  Surgeon: Lelon Perla, MD;  Location: [facility] ENDOSCOPY;  Service: Cardiovascular;  Laterality: N/A;   Social History:  reports that she has never smoked. She has never used smokeless tobacco. She reports that she does not drink alcohol or use illicit drugs.  Allergies  Allergen Reactions  . Wellbutrin [Bupropion Hcl] Hives    Family History  Problem Relation Age of Onset  . Heart disease Father   . Breast cancer Mother   . Heart disease Mother   . Diabetes Paternal  Grandfather   . Hypertension Maternal Grandmother   . Hypertension Brother      Prior to Admission medications   Medication Sig Start Date End Date Taking? Authorizing Provider  B Complex-C-Biotin-E-Min-FA (DIALYVITE 5000 PO) Take 1 tablet by mouth daily.    Historical Provider, MD  CARDIZEM CD 120 MG 24 hr capsule Take 1 capsule (120 mg total) by mouth daily. 01/01/14   Lelon Perla, MD  Cyclophosphamide 50 MG CAPS Take 8 tablets by mouth once a week. 11/24/13   Historical Provider, MD  dexamethasone (DECADRON) 4 MG tablet 10 tabs weekly 09/29/13   Historical Provider, MD  lidocaine-prilocaine (EMLA) cream Apply topically as needed. 04/04/13   Chauncey Cruel, MD  metoprolol (LOPRESSOR) 50 MG tablet Take 0.5 tablets (25 mg total) by mouth 2 (two) times daily. 12/29/13 01/01/14  Lelon Perla, MD  pomalidomide (POMALYST) 4 MG capsule Take 4 mg by mouth daily. Take with water on days 1-21. Repeat every 28 days.    Historical Provider, MD  valACYclovir (VALTREX) 500 MG tablet Take 500 mg by mouth every other day. 11/23/13 01/22/14  Historical Provider, MD  warfarin (COUMADIN) 5 MG tablet Take 1 tablet daily or as directed by coumadin clinic 12/07/13   Josue Hector, MD   Physical Exam: Filed Vitals:   01/01/14 1430 01/01/14 1442 01/01/14 1445 01/01/14 1527  BP: 119/59 119/59 120/70 109/60  Pulse: 94 112 96 99  Temp:    100.1 F (37.8 C)  TempSrc:    Oral  Resp:  16  16  Height:      Weight:      SpO2: 99% 97% 100% 98%    Wt Readings from Last 3 Encounters:  01/01/14 70.308 kg (155 lb)  12/07/13 77.111 kg (170 lb)  10/25/13 76.6 kg (168 lb 14 oz)    General:  Appears calm and comfortable Eyes: PERRL, normal lids, irises & conjunctiva ENT: grossly normal hearing, lips & tongue Neck: no LAD, masses or thyromegaly Cardiovascular: RRR, no m/r/g. Bilateral  LE edema. Telemetry: A fib.  Respiratory: Normal respiratory effort. Bilateral ronchus.  Abdomen: soft, ntnd Skin: no  rash or induration seen on limited exam Musculoskeletal: grossly normal tone BUE/BLE Psychiatric: grossly normal mood and affect, speech fluent and appropriate Neurologic: grossly non-focal.          Labs on Admission:  Basic Metabolic Panel:  Recent Labs Lab 12/27/13 0858 01/01/14 1356  NA 139 135*  K 4.1 5.9*  CL  --  95*  CO2 28 23  GLUCOSE 132 106*  BUN 25.1 60*  CREATININE 4.1* 5.68*  CALCIUM 7.2* 6.9*   Liver Function Tests:  Recent Labs Lab 12/27/13 0858  AST 17  ALT 19  ALKPHOS 80  BILITOT 0.58  PROT 5.1*  ALBUMIN 2.6*   No results found for this basename: LIPASE, AMYLASE,  in the last 168 hours No results found for this basename: AMMONIA,  in the last 168 hours CBC:  Recent Labs Lab 12/27/13 0858 01/01/14 1356  WBC 3.0* 2.0*  NEUTROABS 1.4*  --   HGB 9.2* 7.7*  HCT 28.6* 23.4*  MCV 91.1 91.1  PLT 214 87*   Cardiac Enzymes: No results found for this basename: CKTOTAL, CKMB, CKMBINDEX, TROPONINI,  in the last 168 hours  BNP (last 3 results)  Recent Labs  02/07/13 1442 04/29/13 1220 01/01/14 1320  PROBNP 597.10* 194.0* >70000.0*   CBG: No results found for this basename: GLUCAP,  in the last 168 hours  Radiological Exams on Admission: Dg Chest Port 1 View  01/01/2014   CLINICAL DATA:  Air on 12/29/2013 for cardiac ablation was unsuccessful. Since patient has been having fatigue.  EXAM: PORTABLE CHEST - 1 VIEW  COMPARISON:  09/15/2011.  07/08/2013.  FINDINGS: There is left upper lobe consolidation with milder opacity in the left lower lobe at the lung base partly silhouetting the hemidiaphragm. There is vascular prominence bilaterally, but no other areas of consolidation. Small left effusion is suspected. No pneumothorax.  Right anterior chest wall power Port-A-Cath has its tip at the caval atrial junction. Left sided dual-lumen central venous catheter has its distal tip in the right atrium.  Cardiac silhouette is normal in size. No mediastinal  or convincing hilar masses.  Few subtle lucent lesions are noted in the proximal left humerus. These are stable. Patient reportedly has multiple myeloma based on a prior bone survey. Skeletal structures are diffusely demineralized.  IMPRESSION: 1. Left upper lobe consolidation consistent with pneumonia. Milder opacity at the left lung base which may reflect additional pneumonia or be due to atelectasis, likely with a small associated pleural effusion. 2. No other acute findings.   Electronically Signed   By: Lajean Manes M.D.   On: 01/01/2014 14:33    EKG: Independently reviewed. A fib 100  Assessment/Plan Active Problems:   HCAP (healthcare-associated pneumonia)  1-PNA, Admit to step down. Continue with Vancomycin and Cefepime  pharmacy to  dose. If hypotension, patient will need stress dose steroids.   2-Fever, leukopenia, probably neutropenic fever differential pending. IV antibiotics. Hold chemotherapy discussed with Dr Marin Olp. Dr Jana Hakim will see patient in the morning.   3-A fib: Continue with coumadin per pharmacy. Continue with Cardizem. Cardiology will see patient in consultation.   4-Anemia,Thrombocytopenia; Patient with dyspnea, Hb at 7.7, troponin mildly elevated at .033. Will likely need Blood transfusion. Renal consulted. Transfusion with dialysis. Will defer to renal ordering Blood transfusion.   5-Multiple Myeloma: On Pomalyst, Cyclophosphamide, Decadron. Will hold Chemo, discussed with Dr Marin Olp. Dr Jana Hakim will see patient 9-21. She is on decadron weekly.   6-ESRD; On dialysis. Hyperkalemia. Nephrologist consulted.   7-Dyspnea; Multifactorial. Secondary to anemia, PNA, and Volume overload.    Code Status: Full Code.  DVT Prophylaxis:on coumadin.  Family Communication;  Disposition Plan: expect 4 to 5 days inpatient.   Time spent: 75 minutes.   Niel Hummer A [facility] Pager [phone]

## 2014-01-01 NOTE — ED Notes (Addendum)
ib therapy called to access port

## 2014-01-01 NOTE — ED Provider Notes (Signed)
CSN: [id number]     Arrival date & time 01/01/14  1306 History   First MD Initiated Contact with Patient 01/01/14 1321     Chief Complaint  Patient presents with  . Shortness of Breath     (Consider location/radiation/quality/duration/timing/severity/associated sxs/prior Treatment) HPI 58 year old female with history of multiple myeloma, renal failure, dialysis patient, undergoing chemotherapy, chronic anemia, multiple blood transfusions, chronic fatigue with chronic shortness of breath, atrial fibrillation with unsuccessful attempt at cardioversion within the last week, over the last few days since attempted cardioversion patient feels even more fatigue generally weaker with more shortness of breath but did notice fever at home until arrival to the ED today, no confusion no chest pain no cough no abdominal pain no diarrhea and makes very little sporadic urine since she is a dialysis patient with no rash no sore throat no headache no stiff neck no other concerns. No treatment prior to arrival. She did vomit a small amount yesterday and this morning. She has been on Coumadin for the last few weeks and has been tapering off of a beta blocker and is supposed to start diltiazem tonight for rate control for her atrial fibrillation. Past Medical History  Diagnosis Date  . Fibrocystic breast disease   . Allergic rhinitis   . HTN (hypertension)     mild/ observation only  . Prediabetes   . SUI (stress urinary incontinence, female)   . Overweight (BMI 25.0-29.9)   . Multiple myeloma, stage 3 08/20/11 dx  . Anemia     resolved  . Cancer   . Arthritis   . Acute renal failure (ARF) 10/24/2013   Past Surgical History  Procedure Laterality Date  . Cholecystectomy  1995  . Tonsillectomy and adenoidectomy    . Carpal tunnel release    . Cardioversion N/A 12/29/2013    Procedure: CARDIOVERSION;  Surgeon: Lelon Perla, MD;  Location: [facility] ENDOSCOPY;  Service: Cardiovascular;  Laterality: N/A;  . Tee  without cardioversion N/A 12/29/2013    Procedure: TRANSESOPHAGEAL ECHOCARDIOGRAM (TEE);  Surgeon: Lelon Perla, MD;  Location: [facility] ENDOSCOPY;  Service: Cardiovascular;  Laterality: N/A;   Family History  Problem Relation Age of Onset  . Heart disease Father   . Breast cancer Mother   . Heart disease Mother   . Diabetes Paternal Grandfather   . Hypertension Maternal Grandmother   . Hypertension Brother    History  Substance Use Topics  . Smoking status: Never Smoker   . Smokeless tobacco: Never Used  . Alcohol Use: No   OB History   Grav Para Term Preterm Abortions TAB SAB Ect Mult Living                 Review of Systems  10 Systems reviewed and are negative for acute change except as noted in the HPI.  Allergies  Wellbutrin  Home Medications   Prior to Admission medications   Medication Sig Start Date End Date Taking? Authorizing Provider  Cyclophosphamide 50 MG CAPS Take 8 tablets by mouth once a week. 11/24/13  Yes Historical Provider, MD  dexamethasone (DECADRON) 4 MG tablet 10 tabs weekly 09/29/13  Yes Historical Provider, MD  diltiazem (CARDIZEM) 30 MG tablet Take 30 mg by mouth 4 (four) times daily.   Yes Historical Provider, MD  lidocaine-prilocaine (EMLA) cream Apply topically as needed. 04/04/13  Yes Chauncey Cruel, MD  pomalidomide (POMALYST) 4 MG capsule Take 4 mg by mouth daily. Take with water on days 1-21. Repeat every 28  days.   Yes Historical Provider, MD  valACYclovir (VALTREX) 500 MG tablet Take 500 mg by mouth every other day. 11/23/13 01/22/14 Yes Historical Provider, MD  atenolol (TENORMIN) 25 MG tablet Take 1 tablet (25 mg total) by mouth 2 (two) times daily. 01/06/14   Theodis Blaze, MD  B Complex-C-Biotin-E-Min-FA (DIALYVITE 5000 PO) Take 1 tablet by mouth daily.    Historical Provider, MD  calcium carbonate (TUMS - DOSED IN MG ELEMENTAL CALCIUM) 500 MG chewable tablet Chew 2 tablets (400 mg of elemental calcium total) by mouth 3 (three) times  daily between meals. 01/06/14   Theodis Blaze, MD  cefTAZidime 2 g in dextrose 5 % 50 mL Inject 2 g into the vein every Monday, Wednesday, and Friday with hemodialysis. 01/06/14   Theodis Blaze, MD  HYDROcodone-acetaminophen (NORCO/VICODIN) 5-325 MG per tablet Take 1-2 tablets by mouth every 4 (four) hours as needed for moderate pain. 01/06/14   Theodis Blaze, MD  ipratropium-albuterol (DUONEB) 0.5-2.5 (3) MG/3ML SOLN Take 3 mLs by nebulization 3 (three) times daily. 01/06/14   Theodis Blaze, MD  midodrine (PROAMATINE) 10 MG tablet Take 1 tablet (10 mg total) by mouth every Monday, Wednesday, and Friday with hemodialysis. 01/06/14   Theodis Blaze, MD  ondansetron (ZOFRAN) 4 MG tablet Take 1 tablet (4 mg total) by mouth every 6 (six) hours as needed for nausea. 01/06/14   Theodis Blaze, MD  Vancomycin (VANCOCIN) 750 MG/150ML SOLN Inject 150 mLs (750 mg total) into the vein every Monday, Wednesday, and Friday with hemodialysis. 01/06/14   Theodis Blaze, MD  warfarin (COUMADIN) 5 MG tablet Take 1 tablet tonight 9/25 and starting 9/26 take 2.5 mg tablet daily Saturday and Sunday), follow up in coumadin clinic on Monday 9/28 01/06/14   Theodis Blaze, MD   BP 112/60  Pulse 80  Temp(Src) 99.4 F (37.4 C) (Oral)  Resp 18  Ht 5' 2"  (1.575 m)  Wt 151 lb 14.4 oz (68.9 kg)  BMI 27.78 kg/m2  SpO2 96%  LMP 04/20/2011 Physical Exam  Nursing note and vitals reviewed. Constitutional:  Awake, alert, nontoxic appearance.  HENT:  Head: Atraumatic.  Eyes: Right eye exhibits no discharge. Left eye exhibits no discharge.  Neck: Neck supple.  Cardiovascular:  No murmur heard. Mildly tachycardic irregular rhythm  Pulmonary/Chest: Effort normal. No respiratory distress. She has no wheezes. She has rales. She exhibits no tenderness.  Pulse oximetry normal room air 100% however patient with bilateral crackles at least one third of the way up posteriorly with no wheezes no retractions no accessory muscle usage and speech  is normal at rest; Port-A-Cath site appears clean without erythema or purulent drainage  Abdominal: Soft. Bowel sounds are normal. She exhibits no distension. There is no tenderness. There is no rebound.  Musculoskeletal: She exhibits edema. She exhibits no tenderness.  Baseline ROM, no obvious new focal weakness. Baseline moderate edema to feet and lower legs without cellulitis noted.  Neurological: She is alert.  Mental status and motor strength appears baseline for patient and situation.  Skin: No rash noted.  Psychiatric: She has a normal mood and affect.    ED Course  Procedures (including critical care time) Patient / Family / Caregiver understand and agree with initial ED impression and plan with expectations set for ED visit.Pt stable in ED with no significant deterioration in condition.Patient / Family / Caregiver informed of clinical course, understand medical decision-making process, and agree with plan.d/w Med for admit.  Labs Review Labs Reviewed  CBC - Abnormal; Notable for the following:    WBC 2.0 (*)    RBC 2.57 (*)    Hemoglobin 7.7 (*)    HCT 23.4 (*)    RDW 20.1 (*)    Platelets 87 (*)    All other components within normal limits  BASIC METABOLIC PANEL - Abnormal; Notable for the following:    Sodium 135 (*)    Potassium 5.9 (*)    Chloride 95 (*)    Glucose, Bld 106 (*)    BUN 60 (*)    Creatinine, Ser 5.68 (*)    Calcium 6.9 (*)    GFR calc non Af Amer 8 (*)    GFR calc Af Amer 9 (*)    Anion gap 17 (*)    All other components within normal limits  PRO B NATRIURETIC PEPTIDE - Abnormal; Notable for the following:    Pro B Natriuretic peptide (BNP) >70000.0 (*)    All other components within normal limits  URINALYSIS, ROUTINE W REFLEX MICROSCOPIC - Abnormal; Notable for the following:    Color, Urine AMBER (*)    APPearance CLOUDY (*)    Specific Gravity, Urine 1.036 (*)    Hgb urine dipstick MODERATE (*)    Bilirubin Urine SMALL (*)    Protein, ur >300  (*)    All other components within normal limits  PROTIME-INR - Abnormal; Notable for the following:    Prothrombin Time 21.7 (*)    INR 1.89 (*)    All other components within normal limits  DIFFERENTIAL - Abnormal; Notable for the following:    Lymphocytes Relative 9 (*)    Monocytes Relative 1 (*)    Basophils Relative 4 (*)    Band Neutrophils 16 (*)    Lymphs Abs 0.2 (*)    Monocytes Absolute 0.0 (*)    All other components within normal limits  CBC WITH DIFFERENTIAL - Abnormal; Notable for the following:    WBC 1.3 (*)    RBC 2.39 (*)    Hemoglobin 7.3 (*)    HCT 22.2 (*)    RDW 20.4 (*)    Platelets 71 (*)    Lymphocytes Relative 10 (*)    Basophils Relative 3 (*)    Neutro Abs 1.1 (*)    Lymphs Abs 0.1 (*)    All other components within normal limits  COMPREHENSIVE METABOLIC PANEL - Abnormal; Notable for the following:    BUN 31 (*)    Creatinine, Ser 3.27 (*)    Calcium 6.7 (*)    Total Protein 4.7 (*)    Albumin 2.4 (*)    AST 94 (*)    ALT 493 (*)    GFR calc non Af Amer 15 (*)    GFR calc Af Amer 17 (*)    All other components within normal limits  CBC WITH DIFFERENTIAL - Abnormal; Notable for the following:    Hemoglobin 10.3 (*)    MCH 23.2 (*)    MCHC 26.9 (*)    RDW 21.3 (*)    All other components within normal limits  PROTIME-INR - Abnormal; Notable for the following:    Prothrombin Time 21.6 (*)    INR 1.88 (*)    All other components within normal limits  URINE MICROSCOPIC-ADD ON - Abnormal; Notable for the following:    Squamous Epithelial / LPF MANY (*)    Bacteria, UA FEW (*)    Casts HYALINE CASTS (*)  All other components within normal limits  CBC WITH DIFFERENTIAL - Abnormal; Notable for the following:    WBC 2.0 (*)    RBC 3.20 (*)    Hemoglobin 9.7 (*)    HCT 28.4 (*)    RDW 19.3 (*)    Platelets 64 (*)    Eosinophils Relative 6 (*)    Basophils Relative 3 (*)    Neutro Abs 1.3 (*)    Lymphs Abs 0.3 (*)    All other  components within normal limits  COMPREHENSIVE METABOLIC PANEL - Abnormal; Notable for the following:    Glucose, Bld 123 (*)    BUN 38 (*)    Creatinine, Ser 3.34 (*)    Calcium 6.5 (*)    Total Protein 4.6 (*)    Albumin 2.1 (*)    AST 51 (*)    ALT 297 (*)    GFR calc non Af Amer 14 (*)    GFR calc Af Amer 17 (*)    All other components within normal limits  LACTATE DEHYDROGENASE - Abnormal; Notable for the following:    LDH 370 (*)    All other components within normal limits  PROTIME-INR - Abnormal; Notable for the following:    Prothrombin Time 25.1 (*)    INR 2.28 (*)    All other components within normal limits  COMPREHENSIVE METABOLIC PANEL - Abnormal; Notable for the following:    Sodium 133 (*)    Chloride 94 (*)    Glucose, Bld 103 (*)    BUN 54 (*)    Creatinine, Ser 4.52 (*)    Calcium 5.9 (*)    Total Protein 4.3 (*)    Albumin 1.9 (*)    AST 51 (*)    ALT 208 (*)    GFR calc non Af Amer 10 (*)    GFR calc Af Amer 11 (*)    All other components within normal limits  CBC - Abnormal; Notable for the following:    WBC 2.8 (*)    RBC 3.00 (*)    Hemoglobin 9.0 (*)    HCT 26.9 (*)    RDW 19.2 (*)    Platelets 64 (*)    All other components within normal limits  BASIC METABOLIC PANEL - Abnormal; Notable for the following:    Glucose, Bld 117 (*)    BUN 44 (*)    Creatinine, Ser 3.64 (*)    Calcium 6.5 (*)    GFR calc non Af Amer 13 (*)    GFR calc Af Amer 15 (*)    All other components within normal limits  CBC - Abnormal; Notable for the following:    WBC 2.8 (*)    RBC 3.18 (*)    Hemoglobin 9.7 (*)    HCT 28.0 (*)    RDW 19.0 (*)    Platelets 59 (*)    All other components within normal limits  PROTIME-INR - Abnormal; Notable for the following:    Prothrombin Time 31.5 (*)    INR 3.04 (*)    All other components within normal limits  PROTIME-INR - Abnormal; Notable for the following:    Prothrombin Time 28.5 (*)    INR 2.68 (*)    All other  components within normal limits  CBC - Abnormal; Notable for the following:    WBC 3.4 (*)    RBC 2.99 (*)    Hemoglobin 9.2 (*)    HCT 27.0 (*)  RDW 19.4 (*)    Platelets 98 (*)    All other components within normal limits  RENAL FUNCTION PANEL - Abnormal; Notable for the following:    Sodium 136 (*)    Glucose, Bld 106 (*)    BUN 63 (*)    Creatinine, Ser 4.84 (*)    Calcium 6.6 (*)    Phosphorus 1.8 (*)    Albumin 1.9 (*)    GFR calc non Af Amer 9 (*)    GFR calc Af Amer 11 (*)    All other components within normal limits  I-STAT TROPOININ, ED - Abnormal; Notable for the following:    Troponin i, poc 0.33 (*)    All other components within normal limits  CULTURE, BLOOD (ROUTINE X 2)  CULTURE, BLOOD (ROUTINE X 2)  URINE CULTURE  MRSA PCR SCREENING  TROPONIN I  TROPONIN I  TROPONIN I  HEPATITIS B SURFACE ANTIGEN  CORTISOL  PATHOLOGIST SMEAR REVIEW  MAGNESIUM  PHOSPHORUS  CYTOMEGALOVIRUS PCR, QUALITATIVE  EPSTEIN BARR VRS(EBV DNA BY PCR)  FUNGAL ANTIBODIES PANEL, ID-BLOOD  ASPERGILLUS GALACTOMANNAN ANTIGEN  I-STAT CG4 LACTIC ACID, ED  PREPARE RBC (CROSSMATCH)  TYPE AND SCREEN    Imaging Review No results found.   EKG Interpretation   Date/Time:  Sunday January 01 2014 13:11:17 EDT Ventricular Rate:  107 PR Interval:    QRS Duration: 88 QT Interval:  362 QTC Calculation: 483 R Axis:   85 Text Interpretation:  Atrial fibrillation with rapid ventricular response  Low voltage QRS Septal infarct , age undetermined T wave abnormality,  consider inferior ischemia or digitalis effect No significant change since  last tracing Confirmed by [facility]  MD, Jenny Reichmann ([id number]) on 01/01/2014 1:27:30 PM      MDM   Final diagnoses:  HCAP (healthcare-associated pneumonia)  ESRD (end stage renal disease) on dialysis  Pancytopenia  Myeloma  Chronic atrial fibrillation  Hyperkalemia    The patient appears reasonably stabilized for admission considering the current  resources, flow, and capabilities available in the ED at this time, and I doubt any other [facility] requiring further screening and/or treatment in the ED prior to admission.    Babette Relic, MD 01/11/14 [phone]

## 2014-01-01 NOTE — Progress Notes (Signed)
HD tx began in patient room at [place]. Pt alert, vss, no c/o.  Type and screen drawn and given to floor RN.  Pt supposed to receive 2 UPRBC from [facility].  Must be radiated for pt.

## 2014-01-02 ENCOUNTER — Encounter (HOSPITAL_COMMUNITY): Payer: Self-pay | Admitting: *Deleted

## 2014-01-02 DIAGNOSIS — R740 Nonspecific elevation of levels of transaminase and lactic acid dehydrogenase [LDH]: Secondary | ICD-10-CM

## 2014-01-02 DIAGNOSIS — R7401 Elevation of levels of liver transaminase levels: Secondary | ICD-10-CM | POA: Diagnosis present

## 2014-01-02 DIAGNOSIS — C9000 Multiple myeloma not having achieved remission: Secondary | ICD-10-CM

## 2014-01-02 DIAGNOSIS — A419 Sepsis, unspecified organism: Secondary | ICD-10-CM | POA: Diagnosis present

## 2014-01-02 DIAGNOSIS — D709 Neutropenia, unspecified: Secondary | ICD-10-CM | POA: Diagnosis present

## 2014-01-02 LAB — HEPATITIS B SURFACE ANTIGEN: Hepatitis B Surface Ag: NEGATIVE

## 2014-01-02 LAB — COMPREHENSIVE METABOLIC PANEL
ALBUMIN: 2.4 g/dL — AB (ref 3.5–5.2)
ALK PHOS: 84 U/L (ref 39–117)
ALT: 493 U/L — AB (ref 0–35)
AST: 94 U/L — ABNORMAL HIGH (ref 0–37)
Anion gap: 14 (ref 5–15)
BILIRUBIN TOTAL: 1.2 mg/dL (ref 0.3–1.2)
BUN: 31 mg/dL — ABNORMAL HIGH (ref 6–23)
CO2: 25 mEq/L (ref 19–32)
Calcium: 6.7 mg/dL — ABNORMAL LOW (ref 8.4–10.5)
Chloride: 99 mEq/L (ref 96–112)
Creatinine, Ser: 3.27 mg/dL — ABNORMAL HIGH (ref 0.50–1.10)
GFR calc Af Amer: 17 mL/min — ABNORMAL LOW (ref >90)
GFR calc non Af Amer: 15 mL/min — ABNORMAL LOW (ref >90)
Glucose, Bld: 92 mg/dL (ref 70–99)
POTASSIUM: 4.5 meq/L (ref 3.7–5.3)
Sodium: 138 mEq/L (ref 137–147)
Total Protein: 4.7 g/dL — ABNORMAL LOW (ref 6.0–8.3)

## 2014-01-02 LAB — PREPARE RBC (CROSSMATCH)

## 2014-01-02 LAB — CBC WITH DIFFERENTIAL/PLATELET
Basophils Absolute: 0 10*3/uL (ref 0.0–0.1)
Basophils Relative: 3 % — ABNORMAL HIGH (ref 0–1)
Eosinophils Absolute: 0 10*3/uL (ref 0.0–0.7)
Eosinophils Relative: 3 % (ref 0–5)
HCT: 22.2 % — ABNORMAL LOW (ref 36.0–46.0)
Hemoglobin: 7.3 g/dL — ABNORMAL LOW (ref 12.0–15.0)
LYMPHS PCT: 10 % — AB (ref 12–46)
Lymphs Abs: 0.1 10*3/uL — ABNORMAL LOW (ref 0.7–4.0)
MCH: 30.5 pg (ref 26.0–34.0)
MCHC: 32.9 g/dL (ref 30.0–36.0)
MCV: 92.9 fL (ref 78.0–100.0)
MONOS PCT: 8 % (ref 3–12)
Monocytes Absolute: 0.1 10*3/uL (ref 0.1–1.0)
Neutro Abs: 1.1 10*3/uL — ABNORMAL LOW (ref 1.7–7.7)
Neutrophils Relative %: 76 % (ref 43–77)
Platelets: 71 10*3/uL — ABNORMAL LOW (ref 150–400)
RBC: 2.39 MIL/uL — AB (ref 3.87–5.11)
RDW: 20.4 % — ABNORMAL HIGH (ref 11.5–15.5)
WBC MORPHOLOGY: INCREASED
WBC: 1.3 10*3/uL — AB (ref 4.0–10.5)

## 2014-01-02 LAB — TROPONIN I

## 2014-01-02 MED ORDER — NEPRO/CARBSTEADY PO LIQD: 237.0000 mL | ORAL | Status: DC | PRN

## 2014-01-02 MED ORDER — MIDODRINE HCL 5 MG PO TABS
10.0000 mg | ORAL_TABLET | Freq: Once | ORAL | Status: AC
  Administered 2014-01-02: 10 mg via ORAL
  Filled 2014-01-02: qty 2

## 2014-01-02 MED ORDER — VANCOMYCIN HCL IN DEXTROSE 750-5 MG/150ML-% IV SOLN
750.0000 mg | INTRAVENOUS | Status: DC
  Administered 2014-01-02 – 2014-01-06 (×3): 750 mg via INTRAVENOUS
  Filled 2014-01-02 (×6): qty 150

## 2014-01-02 MED ORDER — MIDODRINE HCL 5 MG PO TABS
10.0000 mg | ORAL_TABLET | ORAL | Status: DC
  Filled 2014-01-02: qty 2

## 2014-01-02 MED ORDER — ALTEPLASE 2 MG IJ SOLR
2.0000 mg | Freq: Once | INTRAMUSCULAR | Status: DC | PRN
  Filled 2014-01-02: qty 2

## 2014-01-02 MED ORDER — SODIUM CHLORIDE 0.9 % IV SOLN: 100.0000 mL | INTRAVENOUS | Status: DC | PRN

## 2014-01-02 MED ORDER — WARFARIN SODIUM 5 MG PO TABS
5.0000 mg | ORAL_TABLET | Freq: Once | ORAL | Status: AC
  Administered 2014-01-02: 5 mg via ORAL
  Filled 2014-01-02: qty 1

## 2014-01-02 MED ORDER — PENTAFLUOROPROP-TETRAFLUOROETH EX AERO
1.0000 | INHALATION_SPRAY | CUTANEOUS | Status: DC | PRN
Start: 2014-01-02 — End: 2014-01-02

## 2014-01-02 MED ORDER — DOCUSATE SODIUM 100 MG PO CAPS
100.0000 mg | ORAL_CAPSULE | Freq: Two times a day (BID) | ORAL | Status: DC
  Administered 2014-01-02 – 2014-01-06 (×8): 100 mg via ORAL
  Filled 2014-01-02 (×10): qty 1

## 2014-01-02 MED ORDER — LIDOCAINE-PRILOCAINE 2.5-2.5 % EX CREA
1.0000 "application " | TOPICAL_CREAM | CUTANEOUS | Status: DC | PRN
  Filled 2014-01-02: qty 5

## 2014-01-02 MED ORDER — POLYETHYLENE GLYCOL 3350 17 G PO PACK
17.0000 g | PACK | Freq: Every day | ORAL | Status: DC | PRN
  Administered 2014-01-02 – 2014-01-05 (×3): 17 g via ORAL
  Filled 2014-01-02 (×3): qty 1

## 2014-01-02 MED ORDER — ATENOLOL 25 MG PO TABS
25.0000 mg | ORAL_TABLET | Freq: Two times a day (BID) | ORAL | Status: DC
  Administered 2014-01-02 – 2014-01-06 (×8): 25 mg via ORAL
  Filled 2014-01-02 (×11): qty 1

## 2014-01-02 MED ORDER — HEPARIN SODIUM (PORCINE) 1000 UNIT/ML DIALYSIS: 500.0000 [IU] | Freq: Once | INTRAMUSCULAR | Status: DC

## 2014-01-02 MED ORDER — CEFEPIME HCL 2 G IJ SOLR
2.0000 g | INTRAMUSCULAR | Status: DC
  Administered 2014-01-02 – 2014-01-04 (×2): 2 g via INTRAVENOUS
  Filled 2014-01-02 (×4): qty 2

## 2014-01-02 MED ORDER — HEPARIN SODIUM (PORCINE) 1000 UNIT/ML DIALYSIS: 1000.0000 [IU] | INTRAMUSCULAR | Status: DC | PRN

## 2014-01-02 MED ORDER — LIDOCAINE HCL (PF) 1 % IJ SOLN: 5.0000 mL | INTRAMUSCULAR | Status: DC | PRN

## 2014-01-02 MED ORDER — NEPRO/CARBSTEADY PO LIQD
237.0000 mL | Freq: Two times a day (BID) | ORAL | Status: DC
  Administered 2014-01-02 – 2014-01-06 (×7): 237 mL via ORAL
  Filled 2014-01-02: qty 237

## 2014-01-02 NOTE — Progress Notes (Signed)
ANTICOAGULATION CONSULT NOTE - Follow Up Consult  Pharmacy Consult for Coumadin Indication: atrial fibrillation  Allergies  Allergen Reactions  . Wellbutrin [Bupropion Hcl] Hives   Labs:  Recent Labs  01/01/14 1356 01/01/14 1553 01/01/14 2212 01/02/14 0440  HGB 7.7*  --   --  7.3*  HCT 23.4*  --   --  22.2*  PLT 87*  --   --  71*  LABPROT 21.7*  --   --   --   INR 1.89*  --   --   --   CREATININE 5.68*  --   --  3.27*  TROPONINI  --  <0.30 <0.30 <0.30    Estimated Creatinine Clearance: 17.8 ml/min (by C-G formula based on Cr of 3.27).  Assessment: 58 year old female on Coumadin PTA for Afib INR = 1.89 on admission  Goal of Therapy:  INR 2-3 Monitor platelets by anticoagulation protocol: Yes   Plan:  Repeat Coumadin 5 mg po x 1 today Daily INR  Thank you. Anette Guarneri, PharmD [phone]  Tad Moore 01/02/2014,9:22 AM

## 2014-01-02 NOTE — Progress Notes (Signed)
Utilization Review Completed.  

## 2014-01-02 NOTE — Procedures (Signed)
I was present at this dialysis session, have reviewed the session itself and made  appropriate changes  Kelly Splinter MD (pgr) [phone]    ([phone] 01/02/2014, 2:28 PM

## 2014-01-02 NOTE — Significant Event (Signed)
CRITICAL VALUE ALERT  Critical value received:  WBC 1.3  Date of notification:  01/02/2014  Time of notification:  0525  Critical value read back:Yes.    Nurse who received alert:  Sanye Ledesma, Terese Door, RN  MD notified (1st page):  Kathline Magic NP  Time of first page:  [phone]  Responding MD: Kathline Magic NP  Time MD responded:  [phone]

## 2014-01-02 NOTE — Progress Notes (Signed)
[name]  Laura Hester KYH:[id number] DOB: [birth year] DOA: 01/01/2014 PCP: Gara Kroner, MD  Brief narrative: 58 year old white female with known history of IgA lambda light chain myeloma status post stem cell transplant, on chronic chemotherapy followed by Dr. Jana Hakim locally and a Dr. in [place]. She'd experienced progressive renal failure do to the multiple myeloma and was a recent start on hemodialysis. She also has underlying history of atrial fibrillation on Coumadin with a recent failed attempt at cardioversion on 9/17 2015. She also has a diagnosis of moderate to severe mitral regurg with normal LV function. Since the recent cardioversion attempts she has noted progressive weakness, dyspnea on exertion, cough without sputum as well as chills for 2 days with fever.  Upon presentation to the emergency department she was found to have potassium of 5.9, hemoglobin 7.7, white count 2000, and a chest x-ray with dense left upper lobe consolidation with concomitant left lower lobe atelectasis versus opacity as well. Admitting evaluation was consistent with likely pneumonia. She's been started on broad-spectrum antibiotics to cover for HCAP in an immunocompromised patient. Oncology was consulted. Chemotherapy currently on hold. Nephrologist has been consulted regarding continuing patient's usual hemodialysis  Since admission despite the severity of her chest x-ray findings patient has been relatively stable. Her blood pressure is a little soft. She's not requiring oxygen. She has a new transaminitis with ALT being 493 and AST 91 with normal total bilirubin; noting on 9/15 LFTs were completely normal. Cardiology has been consulted and planned to consult EP regarding the atrial fibrillation  HPI/Subjective: Alert without any specific complaints at this time.  Assessment/Plan:     HCAP (healthcare-associated pneumonia) Currently not hypoxemic  despite significant dense infiltrate left upper lobe-add incentive spirometry and flutter valve-continue empiric broad-spectrum antibiotics-agree with holding chemotherapy at this juncture    Sepsis/fever Appears to have pulmonary source-sepsis physiology improved noting having to use rate control agents for underlying atrial fibrillation which are contributing to soft blood pressure readings-defer continuous IV fluids since renal failure and will give boluses if develops hypotension-continue antibiotics as above as well as antiviral medication-BP soft so check random cortisol    Atrial fibrillation with RVR Currently rate controlled-blood pressure soft in the 15-17 systolic range-appreciate cardiology assistance-cardiology notes unable to use amiodarone due to interaction with Pomolyst (and also with new transaminitis), renal failure precludes use of Tikosyn, unsure if can utilize flecainide-plan EP evaluation-if blood pressure continues to drop may need to decrease Cardizem dose/already using short acting formulation    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  Baseline hemoglobin anywhere between 7.9 and 9.2 and appears to require transfusions in the past-current hemoglobin 7.3 so continue to follow    Chronic kidney disease stage V requiring chronic dialysis Nephrology 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    Neutropenia, unspecified Baseline leukopenia with WBC around 3000 presumed related to ongoing chemotherapy-since admission WBC up to 1300 so likely related to underlying infectious process-follow  Thrombocytopenia Platelets have been normal most recent reading 214,000 on 9/15 and now with acute thrombocytopenia with platelets 71,000-follow closely suspect related to sepsis + chemo    Multiple myeloma Awaiting formal oncology consultation-chemotherapeutic agents on hold-random cortisol as above    Transaminitis Etiology uncertain but  suspect from shock liver as well as underlying medications noting the chemotherapeutic agents as well as the patient's Septra can cause hepatotoxicity-repeat labs in a.m.-total bilirubin normal and ALT higher than  AST  DVT prophylaxis: SCDs Code Status: Full Family Communication: No other family at bedside Disposition Plan/Expected LOS: Stepdown  Consultants: Cardiology Nephrology EP  Procedures: None  Cultures: 9/20 cultures x2 pending  Antibiotics: Cefepime 9/20 >>> Acyclovir 9/20 >>> Vancomycin 9/20 >>>  Objective: Blood pressure 87/43, pulse 61, temperature 98.2 F (36.8 C), temperature source Oral, resp. rate 23, height 5' 2" (1.575 m), weight 162 lb 0.6 oz (73.5 kg), last menstrual period 04/20/2011, SpO2 95.00%.  Intake/Output Summary (Last 24 hours) at 01/02/14 1125 Last data filed at 01/02/14 0900  Gross per 24 hour  Intake    480 ml  Output   3000 ml  Net  -2520 ml   Exam: Gen: No acute respiratory distress Chest: Diffuse fine expiratory crackles, note poor air movement bilaterally from mid fields down and interestingly more pronounced focally right mid field, room air Cardiac: Irregular rate and rhythm, S1-S2, no rubs murmurs or gallops, no peripheral edema, no JVD-BP soft Abdomen: Soft nontender nondistended without obvious hepatosplenomegaly, no ascites Extremities: Symmetrical in appearance without cyanosis, clubbing or effusion  Scheduled Meds:  Scheduled Meds: . sodium chloride   Intravenous Once  . ceFEPime (MAXIPIME) IV  2 g Intravenous Q M,W,F-HD  . diltiazem  60 mg Oral 4 times per day  . sodium chloride  3 mL Intravenous Q12H  . sodium chloride  3 mL Intravenous Q12H  . valACYclovir  500 mg Oral QODAY  . vancomycin  750 mg Intravenous Q M,W,F-HD  . warfarin  5 mg Oral ONCE-1800  . Warfarin - Pharmacist Dosing Inpatient   Does not apply q1800   Data Reviewed: Basic Metabolic Panel:  Recent Labs Lab 12/27/13 0858 01/01/14 1356  01/02/14 0440  NA 139 135* 138  K 4.1 5.9* 4.5  CL  --  95* 99  CO2 _0 GLUCOSE 132 106* 92  BUN 25.1 60* 31*  CREATININE 4.1* 5.68* 3.27*  CALCIUM 7.2* 6.9* 6.7*   Liver Function Tests:  Recent Labs Lab 12/27/13 0858 01/02/14 0440  AST 17 94*  ALT 19 493*  ALKPHOS 80 84  BILITOT 0.58 1.2  PROT 5.1* 4.7*  ALBUMIN 2.6* 2.4*   CBC:  Recent Labs Lab 12/27/13 0858 01/01/14 1356 01/02/14 0440  WBC 3.0* 2.0* 1.3*  NEUTROABS 1.4* 1.7 1.1*  HGB 9.2* 7.7* 7.3*  HCT 28.6* 23.4* 22.2*  MCV 91.1 91.1 92.9  PLT 214 87* 71*   Cardiac Enzymes:  Recent Labs Lab 01/01/14 1553 01/01/14 2212 01/02/14 0440  TROPONINI <0.30 <0.30 <0.30   BNP (last 3 results)  Recent Labs  02/07/13 1442 04/29/13 1220 01/01/14 1320  PROBNP 597.10* 194.0* >70000.0*    Recent Results (from the past 240 hour(s))  CULTURE, BLOOD (ROUTINE X 2)     Status: None   Collection Time    01/01/14  1:53 PM      Result Value Ref Range Status   Specimen Description BLOOD RIGHT HAND   Final   Special Requests BOTTLES DRAWN AEROBIC AND ANAEROBIC 5 CC   Final   Culture  Setup Time     Final   Value: 01/01/2014 22:00     Performed at [facility]   Culture     Final   Value:        BLOOD CULTURE RECEIVED NO GROWTH TO DATE CULTURE WILL BE HELD FOR 5 DAYS BEFORE ISSUING A FINAL NEGATIVE REPORT     Performed at [facility]   Report Status PENDING  Incomplete  CULTURE, BLOOD (ROUTINE X 2)     Status: None   Collection Time    01/01/14  2:02 PM      Result Value Ref Range Status   Specimen Description BLOOD PORTA CATH   Final   Special Requests BOTTLES DRAWN AEROBIC AND ANAEROBIC 10 CC   Final   Culture  Setup Time     Final   Value: 01/01/2014 21:58     Performed at [facility]   Culture     Final   Value:        BLOOD CULTURE RECEIVED NO GROWTH TO DATE CULTURE WILL BE HELD FOR 5 DAYS BEFORE ISSUING A FINAL NEGATIVE REPORT     Note: Culture results may be  compromised due to an excessive volume of blood received in culture bottles.     Performed at [facility]   Report Status PENDING   Incomplete  MRSA PCR SCREENING     Status: None   Collection Time    01/01/14  6:31 PM      Result Value Ref Range Status   MRSA by PCR NEGATIVE  NEGATIVE Final   Comment:            The GeneXpert MRSA Assay (FDA     approved for NASAL specimens     only), is one component of a     comprehensive MRSA colonization     surveillance program. It is not     intended to diagnose MRSA     infection nor to guide or     monitor treatment for     MRSA infections.     Studies:  Recent x-ray studies have been reviewed in detail by the Attending Physician  Time spent :  Avinger, ANP [facility] Office  [phone] Pager [phone]  On-Call/Text Page:      [email or web address]      password [email or web address]  If 7PM-7AM, please contact night-coverage [email or web address] Password [email or web address] 01/02/2014, 11:25 AM   LOS: 1 day   I have personally examined this patient and reviewed the entire database. I have reviewed the above note, made any necessary editorial changes, and agree with its content.  Cherene Altes, MD [facility]

## 2014-01-02 NOTE — Progress Notes (Signed)
At 0433 pt's temp was 102.1 (oral), pt was warm to the touch, but says she actually is feeling better overall.  Tylenol 650mg  given per PRN order.  Temp was rechecked and is now 99.1(oral).  Notified on call, Kathline Magic NP, of above and previously documented critical WBC.  Will continue to monitor.  AND.Marland KitchenMarland Kitchen Per report, pt is to receive blood transfusions with her HD and was supposed to be transfused with yesterday's HD, but blood was not ready at that time.  Pt is on a MWF HD schedule and is to have HD again today.  Spoke to Santiago Glad in HD to notify her that blood is in the blood bank and the need for transfusion with next treatment.  Kristen from HD called back and confirmed that they will contact the Nephrologist for further orders.

## 2014-01-02 NOTE — Progress Notes (Addendum)
INITIAL NUTRITION ASSESSMENT  DOCUMENTATION CODES Per approved criteria  -Not Applicable   INTERVENTION: Nepro Shake po BID, each supplement provides 425 kcal and 19 grams protein RD to follow for nutrition care plan  NUTRITION DIAGNOSIS: Increased nutrient needs related to ESRD on HD as evidenced by estimated nutrition needs  Goal: Pt to meet >/= 90% of their estimated nutrition needs   Monitor:  PO & supplemental intake, weight, labs, I/O's  Reason for Assessment: Malnutrition Screening Tool Report  58 y.o. female  Admitting Dx: HCAP  ASSESSMENT: 58 y.o. Female with multiple medical problems multiple myeloma, on dialysis, A fib on coumadin, recent cardioversion who presented with progressive SOB, palpitation and fever. She was found to have a tempeture at 101 in the ED.  Patient currently in [place].  RD spoke with patient's husband via telephone; reports pt typically consumes 3 meals per day, however, for the past 2-3 days her appetite has been decreased; PO intake 0-20% per flowsheet records; husband reports pt has been given "a protein supplement" at her HD center (unable to name product); RD to order Nepro supplement at this time.  RD unable to complete Nutrition Focused Physical Exam at this time.  Height: Ht Readings from Last 1 Encounters:  01/01/14 _0  (1.575 m)    Weight: Wt Readings from Last 1 Encounters:  01/01/14 162 lb 0.6 oz (73.5 kg)    Ideal Body Weight: 110 lb  % Ideal Body Weight: 147%  Wt Readings from Last 10 Encounters:  01/01/14 162 lb 0.6 oz (73.5 kg)  12/07/13 170 lb (77.111 kg)  10/25/13 168 lb 14 oz (76.6 kg)  10/24/13 157 lb 9.6 oz (71.487 kg)  09/26/13 156 lb 3.2 oz (70.852 kg)  09/06/13 160 lb 1.6 oz (72.621 kg)  08/29/13 162 lb (73.483 kg)  08/22/13 158 lb 6.4 oz (71.85 kg)  08/08/13 159 lb 3.2 oz (72.213 kg)  08/01/13 165 lb 3.2 oz (74.934 kg)    Usual Body Weight: 158 lb  % Usual Body Weight: 102%  BMI:  Body  mass index is 29.63 kg/(m^2).  Estimated Nutritional Needs: Kcal: 2000-2200 Protein: 100-110 gm Fluid: 1200 ml  Skin: Intact  Diet Order: Renal w/1200 ml fluid restriction   EDUCATION NEEDS: -No education needs identified at this time   Intake/Output Summary (Last 24 hours) at 01/02/14 1155 Last data filed at 01/02/14 0900  Gross per 24 hour  Intake    480 ml  Output   3000 ml  Net  -2520 ml    Labs:   Recent Labs Lab 12/27/13 0858 01/01/14 1356 01/02/14 0440  NA 139 135* 138  K 4.1 5.9* 4.5  CL  --  95* 99  CO2 _1 BUN 25.1 60* 31*  CREATININE 4.1* 5.68* 3.27*  CALCIUM 7.2* 6.9* 6.7*  GLUCOSE 132 106* 92    Scheduled Meds: . sodium chloride   Intravenous Once  . atenolol  25 mg Oral BID  . ceFEPime (MAXIPIME) IV  2 g Intravenous Q M,W,F-HD  . sodium chloride  3 mL Intravenous Q12H  . sodium chloride  3 mL Intravenous Q12H  . valACYclovir  500 mg Oral QODAY  . vancomycin  750 mg Intravenous Q M,W,F-HD  . warfarin  5 mg Oral ONCE-1800  . Warfarin - Pharmacist Dosing Inpatient   Does not apply q1800    Continuous Infusions:   Past Medical History  Diagnosis Date  . Fibrocystic breast disease   . Allergic rhinitis   .  HTN (hypertension)     mild/ observation only  . Prediabetes   . SUI (stress urinary incontinence, female)   . Overweight (BMI 25.0-29.9)   . Multiple myeloma, stage 3 08/20/11 dx  . Anemia     resolved  . Cancer   . Arthritis   . Acute renal failure (ARF) 10/24/2013    Past Surgical History  Procedure Laterality Date  . Cholecystectomy  1995  . Tonsillectomy and adenoidectomy    . Carpal tunnel release    . Cardioversion N/A 12/29/2013    Procedure: CARDIOVERSION;  Surgeon: Lelon Perla, MD;  [facility] ENDOSCOPY;  Service: Cardiovascular;  Laterality: N/A;  . Tee without cardioversion N/A 12/29/2013    Procedure: TRANSESOPHAGEAL ECHOCARDIOGRAM (TEE);  Surgeon: Lelon Perla, MD;  Location: [place];  Service:  Cardiovascular;  Laterality: N/A;    Arthur Holms, RD, LDN Pager #: [phone] After-Hours Pager #: [phone]

## 2014-01-02 NOTE — Progress Notes (Signed)
Subjective: Feeling better.  No dizziness.    Objective: Vital signs in last 24 hours: Temp:  [98.2 F (36.8 C)-102.1 F (38.9 C)] 98.2 F (36.8 C) (09/21 0819) Pulse Rate:  [61-178] 61 (09/21 0819) Resp:  [16-33] 23 (09/21 0819) BP: (87-152)/(43-90) 87/43 mmHg (09/21 0819) SpO2:  [94 %-100 %] 95 % (09/21 0819) Weight:  [155 lb (70.308 kg)-167 lb 5.3 oz (75.9 kg)] 162 lb 0.6 oz (73.5 kg) (09/20 2300) Last BM Date: 12/31/13  Intake/Output from previous day: 09/20 0701 - 09/21 0700 In: 340 [I.V.:140; IV Piggyback:200] Out: 3000  Intake/Output this shift:    Medications Current Facility-Administered Medications  Medication Dose Route Frequency Provider Last Rate Last Dose  . 0.9 %  sodium chloride infusion  250 mL Intravenous PRN Belkys A Regalado, MD      . 0.9 %  sodium chloride infusion  100 mL Intravenous PRN Ramiro Harvest, PA-C      . 0.9 %  sodium chloride infusion  100 mL Intravenous PRN Ramiro Harvest, PA-C      . 0.9 %  sodium chloride infusion   Intravenous Once Ramiro Harvest, PA-C      . acetaminophen (TYLENOL) tablet 650 mg  650 mg Oral Q6H PRN Belkys A Regalado, MD   650 mg at 01/02/14 0440   Or  . acetaminophen (TYLENOL) suppository 650 mg  650 mg Rectal Q6H PRN Belkys A Regalado, MD      . ceFEPIme (MAXIPIME) 2 g in dextrose 5 % 50 mL IVPB  2 g Intravenous Q M,W,F-HD Belkys A Regalado, MD      . diltiazem (CARDIZEM) tablet 60 mg  60 mg Oral 4 times per day Josue Hector, MD   60 mg at 01/01/14 2315  . feeding supplement (NEPRO CARB STEADY) liquid 237 mL  237 mL Oral PRN Ramiro Harvest, PA-C      . heparin injection 1,000 Units  1,000 Units Dialysis PRN Ramiro Harvest, PA-C      . HYDROcodone-acetaminophen (NORCO/VICODIN) 5-325 MG per tablet 1-2 tablet  1-2 tablet Oral Q4H PRN Belkys A Regalado, MD      . lidocaine (PF) (XYLOCAINE) 1 % injection 5 mL  5 mL Intradermal PRN Ramiro Harvest, PA-C      . lidocaine-prilocaine (EMLA) cream 1 application  1 application  Topical PRN Ramiro Harvest, PA-C      . ondansetron (ZOFRAN) tablet 4 mg  4 mg Oral Q6H PRN Belkys A Regalado, MD       Or  . ondansetron (ZOFRAN) injection 4 mg  4 mg Intravenous Q6H PRN Belkys A Regalado, MD      . pentafluoroprop-tetrafluoroeth (GEBAUERS) aerosol 1 application  1 application Topical PRN Ramiro Harvest, PA-C      . sodium chloride 0.9 % injection 3 mL  3 mL Intravenous Q12H Belkys A Regalado, MD      . sodium chloride 0.9 % injection 3 mL  3 mL Intravenous Q12H Belkys A Regalado, MD   3 mL at 01/01/14 2316  . sodium chloride 0.9 % injection 3 mL  3 mL Intravenous PRN Belkys A Regalado, MD      . valACYclovir (VALTREX) tablet 500 mg  500 mg Oral QODAY Belkys A Regalado, MD   500 mg at 01/01/14 2315  . vancomycin (VANCOCIN) IVPB 750 mg/150 ml premix  750 mg Intravenous Q M,W,F-HD Elmarie Shiley, MD      . Warfarin - Pharmacist Dosing Inpatient   Does not apply  [id number] Manley Mason, RPH       Facility-Administered Medications Ordered in Other Encounters  Medication Dose Route Frequency Provider Last Rate Last Dose  . sodium chloride 0.9 % injection 10 mL  10 mL Intracatheter PRN Amy G Berry, PA-C   10 mL at 05/30/13 1640  . sodium chloride 0.9 % injection 10 mL  10 mL Intravenous PRN Chauncey Cruel, MD   10 mL at 08/08/13 1518    PE: General appearance: alert, cooperative and no distress Lungs: Decreased BS on th left.  mild rales on the left. No wheeze Heart: irregularly irregular rhythm and 1/6 sys MM Extremities: 2+ LEE Pulses: 2+ and symmetric Skin: Warm and dry Neurologic: Grossly normal  Lab Results:   Recent Labs  01/01/14 1356 01/02/14 0440  WBC 2.0* 1.3*  HGB 7.7* 7.3*  HCT 23.4* 22.2*  PLT 87* 71*   BMET  Recent Labs  01/01/14 1356 01/02/14 0440  NA 135* 138  K 5.9* 4.5  CL 95* 99  CO2 23 25  GLUCOSE 106* 92  BUN 60* 31*  CREATININE 5.68* 3.27*  CALCIUM 6.9* 6.7*   PT/INR  Recent Labs  01/01/14 1356  LABPROT 21.7*  INR 1.89*     Assessment/Plan 58 year old woman with multiple myeloma and renal failure who presents with increasing shortness of breath and weakness.  Active Problems: 1. persistent atrial fibrillation, status post unsuccessful attempt at direct current cardioversion on 12/29/13 x5.  Maintaining Afib with a rate in the 70's.  On cardizem 23m Q6.  Beta blocker made her feel weak.  Hypotensive but feeling better.  Continue to monitor.  On coumadin.  Needs PT/INR this morning.  INR 1.89 yesterday.   EP to see.   2. significant mitral regurgitation of recent onset  3. left upper lobe pneumonia   Cefepime and vanc.  4. stage V renal failure on dialysis   HD today.  Needs volume removed.  5. severe anemia   Hgb 7.3.  Scheduled for two units PRBCs today at dialysis session.  6. multiple myeloma 7. Elevated LFTs.     LOS: 1 day    HAGER, BRYAN PA-C 01/02/2014 8:48 AM  Patient seen and examined and history reviewed. Agree with above findings and plan. Feels weak. No SOB. Afib rate under control now on po cardizem and she is on coumadin. Scheduled for dialysis and transfusion today. EP consult is pending. She has mod-severe MR with moderate LAE by TEE on 12/29/13. Not noted on Echo in April 2015. May need to readdress once acute medical problems are treated and volume status optimized.  Peter JMartinique MColumbia9/21/2015 1:01 PM

## 2014-01-02 NOTE — Progress Notes (Signed)
  [facility] Progress Note   Subjective: feels a lot better after HD last night.  Tmax was 102 deg F, denies chills or prod cough, no CP  Filed Vitals:   01/02/14 0014 01/02/14 0433 01/02/14 0600 01/02/14 0819  BP: 124/60 109/56 111/52 87/43  Pulse: 120 100 94 61  Temp: 100.6 F (38.1 C) 102.1 F (38.9 C) 99.1 F (37.3 C) 98.2 F (36.8 C)  TempSrc: Oral Oral Oral Oral  Resp: 23 30 23 23   Height:      Weight:      SpO2: 94% 94% 95% 95%   Exam: Alert, no distress TDC left chest No JVD  Bibasilar fine crackles, R > L Irreg irreg S1S2 No S3 1/6 murmur apex Abdomen: soft, without focal tenderness  2+ pitting bilat LE edema to the hips  Neuro is alert, Ox3, no focal deficit  HD: MWF [place] 3.5h  73kg   3K/2.50 [place]  IJ Catheter   Heparin 500 (five hundred) units   No ESA (per [facility] oncology)      Assessment: 1 Dyspnea / fever - SOB due to vol overload and probable assoc PNA, is better after HD yesterday pm 2 Fever - 102, focal dense LUL infiltrate, on vanc and cefipime for suspected PNA 3 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, started HD 2-3 months ago, hoping for recovery still per pt / husband 4 IgA lambda LC myeloma - hx of prior SCT 2013, then recurrence; on active chemoRx 5 Anemia for transfusion today 6 Afib on coumadin, failed attempt to cardiovert last week - cardiology evaluating 7 Mitral valve regurgitation- new finding on TEE, EF preserved   Plan- HD again today, transfuse, repeat CXR after HD     Kelly Splinter MD  pager [phone]    cell [phone]  01/02/2014, 8:56 AM     Recent Labs Lab 12/27/13 0858 01/01/14 1356 01/02/14 0440  NA 139 135* 138  K 4.1 5.9* 4.5  CL  --  95* 99  CO2 28 23 25   GLUCOSE 132 106* 92  BUN 25.1 60* 31*  CREATININE 4.1* 5.68* 3.27*  CALCIUM 7.2* 6.9* 6.7*    Recent Labs Lab 12/27/13 0858 01/02/14 0440  AST 17 94*  ALT 19 493*  ALKPHOS 80 84  BILITOT 0.58 1.2  PROT  5.1* 4.7*  ALBUMIN 2.6* 2.4*    Recent Labs Lab 12/27/13 0858 01/01/14 1356 01/02/14 0440  WBC 3.0* 2.0* 1.3*  NEUTROABS 1.4* 1.7 1.1*  HGB 9.2* 7.7* 7.3*  HCT 28.6* 23.4* 22.2*  MCV 91.1 91.1 92.9  PLT 214 87* 71*   . sodium chloride   Intravenous Once  . ceFEPime (MAXIPIME) IV  2 g Intravenous Q M,W,F-HD  . diltiazem  60 mg Oral 4 times per day  . sodium chloride  3 mL Intravenous Q12H  . sodium chloride  3 mL Intravenous Q12H  . valACYclovir  500 mg Oral QODAY  . vancomycin  750 mg Intravenous Q M,W,F-HD  . Warfarin - Pharmacist Dosing Inpatient   Does not apply q1800     sodium chloride, sodium chloride, sodium chloride, acetaminophen, acetaminophen, feeding supplement (NEPRO CARB STEADY), heparin, HYDROcodone-acetaminophen, lidocaine (PF), lidocaine-prilocaine, ondansetron (ZOFRAN) IV, ondansetron, pentafluoroprop-tetrafluoroeth, sodium chloride

## 2014-01-02 NOTE — Consult Note (Signed)
ELECTROPHYSIOLOGY CONSULT NOTE  Patient ID: Laura Hester, MRN: [id number], DOB/AGE: 1955-10-10 58 y.o. Admit date: 01/01/2014 Date of Consult: 01/02/2014  Primary Physician: Gara Kroner, MD Primary Cardiologist: pnISHAN  Chief Complaint: ATRIAL FIBRILLATION REFRACTORY TO CARDIOVERSION   HPI Laura Hester is a 58 y.o. female  Admitted with significant shortness of breath. 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 She is known left atrial enlargement with some degree of diastolic dysfunction; she noted palpitations and was diagnosed with atrial fibrillation earlier this summer and saw Dr. Mariana Single a few weeks ago who began her on anticoagulation and metoprolol tartrate. The anticipated cardioversion which was attempted last Thursday but failed on multiple occasions (question ERAF) because of worsening symptoms despite metoprolol succinate, she was transitioned to diltiazem. However, she was admitted on the weekend because of profound shortness of breath and dyspnea on exertion. Chest x-ray was felt to be consistent with pneumonia. She underwent dialysis with significant improvement in symptoms. She was and remains volume overloaded.  Heart rates are much improved over the last 24 hours, currently in the 70-80 range. She is feeling much better.  Other laboratories are notable for hemoglobin of just over 7.  In addition, her echocardiogram demonstrated severe mitral regurgitation; transthoracic echo 7/15 had demonstrated moderate left atrial enlargement at 45/2.5.      Past Medical History  Diagnosis Date  . Fibrocystic breast disease   . Allergic rhinitis   . HTN (hypertension)     mild/ observation only  . Prediabetes   . SUI (stress urinary incontinence, female)   . Overweight (BMI 25.0-29.9)   . Multiple myeloma, stage 3 08/20/11 dx  . Anemia     resolved  . Cancer   . Arthritis   . Acute renal failure (ARF) 10/24/2013       Surgical History:  Past Surgical History  Procedure Laterality Date  . Cholecystectomy  1995  . Tonsillectomy and adenoidectomy    . Carpal tunnel release    . Cardioversion N/A 12/29/2013    Procedure: CARDIOVERSION;  Surgeon: Lelon Perla, MD;  Location: [facility] ENDOSCOPY;  Service: Cardiovascular;  Laterality: N/A;  . Tee without cardioversion N/A 12/29/2013    Procedure: TRANSESOPHAGEAL ECHOCARDIOGRAM (TEE);  Surgeon: Lelon Perla, MD;  Location: [facility] ENDOSCOPY;  Service: Cardiovascular;  Laterality: N/A;     Home Meds: Prior to Admission medications   Medication Sig Start Date End Date Taking? Authorizing Provider  B Complex-C-Biotin-E-Min-FA (DIALYVITE 5000 PO) Take 1 tablet by mouth daily.    Historical Provider, MD  CARDIZEM CD 120 MG 24 hr capsule Take 1 capsule (120 mg total) by mouth daily. 01/01/14   Lelon Perla, MD  Cyclophosphamide 50 MG CAPS Take 8 tablets by mouth once a week. 11/24/13   Historical Provider, MD  dexamethasone (DECADRON) 4 MG tablet 10 tabs weekly 09/29/13   Historical Provider, MD  lidocaine-prilocaine (EMLA) cream Apply topically as needed. 04/04/13   Chauncey Cruel, MD  metoprolol (LOPRESSOR) 50 MG tablet Take 0.5 tablets (25 mg total) by mouth 2 (two) times daily. 12/29/13 01/01/14  Lelon Perla, MD  pomalidomide (POMALYST) 4 MG capsule Take 4 mg by mouth daily. Take with water on days 1-21. Repeat every 28 days.    Historical Provider, MD  valACYclovir (VALTREX) 500 MG tablet Take 500 mg by mouth every other day. 11/23/13 01/22/14  Historical Provider, MD  warfarin (COUMADIN) 5 MG tablet Take 1 tablet  daily or as directed by coumadin clinic 12/07/13   Josue Hector, MD    Inpatient Medications:  . sodium chloride   Intravenous Once  . atenolol  25 mg Oral BID  . ceFEPime (MAXIPIME) IV  2 g Intravenous Q M,W,F-HD  . sodium chloride  3 mL Intravenous Q12H  . sodium chloride  3 mL Intravenous Q12H  . valACYclovir  500 mg Oral QODAY  .  vancomycin  750 mg Intravenous Q M,W,F-HD  . warfarin  5 mg Oral ONCE-1800  . Warfarin - Pharmacist Dosing Inpatient   Does not apply q1800     Allergies:  Allergies  Allergen Reactions  . Wellbutrin [Bupropion Hcl] Hives    History   Social History  . Marital Status: Married    Spouse Name: N/A    Number of Children: 2  . Years of Education: N/A   Occupational History  . retired Pharmacist, hospital   .  [place] History Main Topics  . Smoking status: Never Smoker   . Smokeless tobacco: Never Used  . Alcohol Use: No  . Drug Use: No  . Sexual Activity: Not on file   Other Topics Concern  . Not on file   Social History Narrative  . No narrative on file     Family History  Problem Relation Age of Onset  . Heart disease Father   . Breast cancer Mother   . Heart disease Mother   . Diabetes Paternal Grandfather   . Hypertension Maternal Grandmother   . Hypertension Brother      ROS:  Please see the history of present illness.     All other systems reviewed and negative.    Physical Exam   Blood pressure 87/43, pulse 61, temperature 98.2 F (36.8 C), temperature source Oral, resp. rate 23, height _0  (1.575 m), weight 162 lb 0.6 oz (73.5 kg), last menstrual period 04/20/2011, SpO2 95.00%. General: Well developed, well nourished female in no acute distress. Head: Normocephalic, atraumatic, sclera non-icteric, no xanthomas, nares are without discharge. EENT: normal Lymph Nodes:  none Back: without scoliosis/kyphosis , no CVA tendersness Neck: Negative for carotid bruits. JVD .>8-10 Lungs: Clear bilaterally to auscultation without wheezes, rales, or rhonchi. Breathing is unlabored. Heart: RRR with S1 S2. 2 /6 systolic murmur , rubs, or gallops appreciated. Abdomen: Soft, non-tender, non-distended with normoactive bowel sounds. No hepatomegaly. No rebound/guarding. No obvious abdominal masses. Msk:  Strength and tone appear normal for  age. Extremities: No clubbing or cyanosis. 3+ edema.  Distal pedal pulses are 2+ and equal bilaterally. Skin: Warm and Dry Neuro: Alert and oriented X 3. CN III-XII intact Grossly normal sensory and motor function . Psych:  Responds to questions appropriately with a normal affect.      Labs: Cardiac Enzymes  Recent Labs  01/01/14 1553 01/01/14 2212 01/02/14 0440  TROPONINI <0.30 <0.30 <0.30   CBC Lab Results  Component Value Date   WBC 1.3* 01/02/2014   HGB 7.3* 01/02/2014   HCT 22.2* 01/02/2014   MCV 92.9 01/02/2014   PLT 71* 01/02/2014   PROTIME:  Recent Labs  01/01/14 1356  LABPROT 21.7*  INR 1.89*   Chemistry  Recent Labs Lab 01/02/14 0440  NA 138  K 4.5  CL 99  CO2 25  BUN 31*  CREATININE 3.27*  CALCIUM 6.7*  PROT 4.7*  BILITOT 1.2  ALKPHOS 84  ALT 493*  AST 94*  GLUCOSE 92   Lipids No results found  for this basename: CHOL, HDL, LDLCALC, TRIG   BNP Pro B Natriuretic peptide (BNP)  Date/Time Value Ref Range Status  01/01/2014  1:20 PM >70000.0* 0 - 125 pg/mL Final  04/29/2013 12:20 PM 194.0* <126 pg/mL Final  02/07/2013  2:42 PM 597.10* <126 pg/mL Final   Miscellaneous No results found for this basename: DDIMER    Radiology/Studies:  Dg Chest Port 1 View  01/01/2014   CLINICAL DATA:  Air on 12/29/2013 for cardiac ablation was unsuccessful. Since patient has been having fatigue.  EXAM: PORTABLE CHEST - 1 VIEW  COMPARISON:  09/15/2011.  07/08/2013.  FINDINGS: There is left upper lobe consolidation with milder opacity in the left lower lobe at the lung base partly silhouetting the hemidiaphragm. There is vascular prominence bilaterally, but no other areas of consolidation. Small left effusion is suspected. No pneumothorax.  Right anterior chest wall power Port-A-Cath has its tip at the caval atrial junction. Left sided dual-lumen central venous catheter has its distal tip in the right atrium.  Cardiac silhouette is normal in size. No mediastinal or  convincing hilar masses.  Few subtle lucent lesions are noted in the proximal left humerus. These are stable. Patient reportedly has multiple myeloma based on a prior bone survey. Skeletal structures are diffusely demineralized.  IMPRESSION: 1. Left upper lobe consolidation consistent with pneumonia. Milder opacity at the left lung base which may reflect additional pneumonia or be due to atelectasis, likely with a small associated pleural effusion. 2. No other acute findings.   Electronically Signed   By: Lajean Manes M.D.   On: 01/01/2014 14:33    EKG: atria fib  Assessment and Plan:   Atrial fibrillation-failed cardioversion  Renal failure  Amyloid  Multiple myeloma  Anemia  MR severe    She is much improved today with HD and slowed ventricular rate. She did not tolerate metoprolol tartrate, and dilt has done well, but CCB and Dig are contraindicated in the setting of amyloid heart disease which it is reasonable to presume she has with diastolic dysfunction and left atriopathy that preceded her MR  That she is feeling better allows the current course of rate control and maybe treatment of her CHF and pneumonia will make the situation more favorable towards cardioversion   a rhythm control  course however will be further complicated by the inability to use amiodarone ( chemo interaction ) and class III drugs (WHICH WOULD LOWER DFT) 2/2 renal issues. 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    Virl Axe

## 2014-01-02 NOTE — Progress Notes (Signed)
Kathline Magic NP made aware that blood transfusion was not administered during dialysis because blood was not available at that time. Pt states She is always transfused during dialysis. Blood bank called to notify us of availability of 2U. Pt for dialysis in am.Orders given to have pt transfused in am session.

## 2014-01-03 ENCOUNTER — Inpatient Hospital Stay (HOSPITAL_COMMUNITY): Payer: BC Managed Care – PPO

## 2014-01-03 ENCOUNTER — Other Ambulatory Visit: Payer: BC Managed Care – PPO

## 2014-01-03 DIAGNOSIS — R918 Other nonspecific abnormal finding of lung field: Secondary | ICD-10-CM

## 2014-01-03 DIAGNOSIS — D709 Neutropenia, unspecified: Secondary | ICD-10-CM

## 2014-01-03 DIAGNOSIS — A419 Sepsis, unspecified organism: Secondary | ICD-10-CM

## 2014-01-03 LAB — COMPREHENSIVE METABOLIC PANEL
ALBUMIN: 2.1 g/dL — AB (ref 3.5–5.2)
ALK PHOS: 71 U/L (ref 39–117)
ALT: 297 U/L — ABNORMAL HIGH (ref 0–35)
ANION GAP: 12 (ref 5–15)
AST: 51 U/L — ABNORMAL HIGH (ref 0–37)
BUN: 38 mg/dL — ABNORMAL HIGH (ref 6–23)
CALCIUM: 6.5 mg/dL — AB (ref 8.4–10.5)
CO2: 28 mEq/L (ref 19–32)
Chloride: 98 mEq/L (ref 96–112)
Creatinine, Ser: 3.34 mg/dL — ABNORMAL HIGH (ref 0.50–1.10)
GFR calc Af Amer: 17 mL/min — ABNORMAL LOW (ref >90)
GFR calc non Af Amer: 14 mL/min — ABNORMAL LOW (ref >90)
Glucose, Bld: 123 mg/dL — ABNORMAL HIGH (ref 70–99)
POTASSIUM: 3.7 meq/L (ref 3.7–5.3)
SODIUM: 138 meq/L (ref 137–147)
Total Bilirubin: 1 mg/dL (ref 0.3–1.2)
Total Protein: 4.6 g/dL — ABNORMAL LOW (ref 6.0–8.3)

## 2014-01-03 LAB — CBC WITH DIFFERENTIAL/PLATELET
BASOS ABS: 0.1 10*3/uL (ref 0.0–0.1)
Basophils Relative: 3 % — ABNORMAL HIGH (ref 0–1)
EOS ABS: 0.1 10*3/uL (ref 0.0–0.7)
Eosinophils Relative: 6 % — ABNORMAL HIGH (ref 0–5)
HCT: 28.4 % — ABNORMAL LOW (ref 36.0–46.0)
HEMATOCRIT: 38.3 % (ref 36.0–46.0)
HEMOGLOBIN: 10.3 g/dL — AB (ref 12.0–15.0)
Hemoglobin: 9.7 g/dL — ABNORMAL LOW (ref 12.0–15.0)
LYMPHS PCT: 17 % (ref 12–46)
Lymphs Abs: 0.3 10*3/uL — ABNORMAL LOW (ref 0.7–4.0)
MCH: 23.2 pg — AB (ref 26.0–34.0)
MCH: 30.3 pg (ref 26.0–34.0)
MCHC: 26.9 g/dL — AB (ref 30.0–36.0)
MCHC: 34.2 g/dL (ref 30.0–36.0)
MCV: 86.3 fL (ref 78.0–100.0)
MCV: 88.8 fL (ref 78.0–100.0)
MONOS PCT: 11 % (ref 3–12)
Monocytes Absolute: 0.2 10*3/uL (ref 0.1–1.0)
Neutro Abs: 1.3 10*3/uL — ABNORMAL LOW (ref 1.7–7.7)
Neutrophils Relative %: 63 % (ref 43–77)
PLATELETS: 64 10*3/uL — AB (ref 150–400)
Platelets: 253 10*3/uL (ref 150–400)
RBC: 4.44 MIL/uL (ref 3.87–5.11)
RBC: 3.2 MIL/uL — ABNORMAL LOW (ref 3.87–5.11)
RDW: 21.3 % — ABNORMAL HIGH (ref 11.5–15.5)
RDW: 19.3 % — AB (ref 11.5–15.5)
WBC: 9.2 10*3/uL (ref 4.0–10.5)
WBC: 2 10*3/uL — ABNORMAL LOW (ref 4.0–10.5)

## 2014-01-03 LAB — URINALYSIS, ROUTINE W REFLEX MICROSCOPIC
GLUCOSE, UA: NEGATIVE mg/dL
KETONES UR: NEGATIVE mg/dL
LEUKOCYTES UA: NEGATIVE
Nitrite: NEGATIVE
Specific Gravity, Urine: 1.036 — ABNORMAL HIGH (ref 1.005–1.030)
Urobilinogen, UA: 0.2 mg/dL (ref 0.0–1.0)
pH: 7.5 (ref 5.0–8.0)

## 2014-01-03 LAB — TYPE AND SCREEN
ABO/RH(D): A POS
Antibody Screen: NEGATIVE
UNIT DIVISION: 0
Unit division: 0

## 2014-01-03 LAB — LACTATE DEHYDROGENASE: LDH: 370 U/L — ABNORMAL HIGH (ref 94–250)

## 2014-01-03 LAB — PATHOLOGIST SMEAR REVIEW

## 2014-01-03 LAB — URINE MICROSCOPIC-ADD ON

## 2014-01-03 LAB — CORTISOL: Cortisol, Plasma: 38.6 ug/dL

## 2014-01-03 LAB — PROTIME-INR
INR: 1.88 — ABNORMAL HIGH (ref 0.00–1.49)
Prothrombin Time: 21.6 seconds — ABNORMAL HIGH (ref 11.6–15.2)

## 2014-01-03 LAB — PHOSPHORUS: Phosphorus: 2.4 mg/dL (ref 2.3–4.6)

## 2014-01-03 LAB — MAGNESIUM: Magnesium: 1.8 mg/dL (ref 1.5–2.5)

## 2014-01-03 MED ORDER — MIDODRINE HCL 5 MG PO TABS
10.0000 mg | ORAL_TABLET | ORAL | Status: DC
  Administered 2014-01-04 – 2014-01-06 (×2): 10 mg via ORAL
  Filled 2014-01-03 (×2): qty 2

## 2014-01-03 MED ORDER — WARFARIN SODIUM 7.5 MG PO TABS
7.5000 mg | ORAL_TABLET | Freq: Once | ORAL | Status: AC
  Administered 2014-01-03: 7.5 mg via ORAL
  Filled 2014-01-03: qty 1

## 2014-01-03 MED ORDER — IPRATROPIUM-ALBUTEROL 0.5-2.5 (3) MG/3ML IN SOLN
3.0000 mL | Freq: Four times a day (QID) | RESPIRATORY_TRACT | Status: DC
  Administered 2014-01-03 (×2): 3 mL via RESPIRATORY_TRACT
  Filled 2014-01-03 (×3): qty 3

## 2014-01-03 NOTE — Progress Notes (Signed)
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 Jana Hakim locally and a Dr. Melba Coon in [place]. She'd experienced progressive renal failure do to the multiple myeloma and was a recent start on hemodialysis. She also has underlying history of atrial fibrillation on Coumadin with a recent failed attempt at cardioversion on 9/17 2015. She also has a diagnosis of moderate to severe mitral regurg with normal LV function. Since the recent cardioversion attempts she has noted progressive weakness, dyspnea on exertion, cough without sputum as well as chills for 2 days with fever.  Upon presentation to the emergency department she was found to have potassium of 5.9, hemoglobin 7.7, white count 2000, and a chest x-ray with dense left upper lobe consolidation with concomitant left lower lobe atelectasis versus opacity as well. Admitting evaluation was consistent with likely pneumonia. She's been started on broad-spectrum antibiotics to cover for HCAP in an immunocompromised patient. Oncology was consulted. Chemotherapy currently on hold. Nephrologist has been consulted regarding continuing patient's usual hemodialysis  Since admission despite the severity of her chest x-ray findings patient has been relatively stable. Follow up CXR 9/22 significantly improved. Her blood pressure was a little soft but has improved after Nephro added Midodrine. She has not required oxygen. She has a new transaminitis (elevated AST/ALT) with normal total bilirubin but current trend is downward. On 9/15 LFTs were completely normal. Cardiology has been consulted as well as EP regarding the atrial fibrillation. Cardizem has been dc'd in favor of Atenolol. Dr Jana Hakim has also  evaluated the patient.   HPI/Subjective: Alert , no SOB.  Assessment/Plan:     HCAP (healthcare-associated pneumonia) Currently not hypoxemic despite significant dense infiltrate left upper lobe-add incentive spirometry and flutter valve-continue empiric broad-spectrum antibiotics-agree with holding chemotherapy at this juncture-CXR 9/22 improved    Sepsis/fever Appears to have a pulmonary source-sepsis physiology improved noting having to use rate control agents for underlying atrial fibrillation which are contributing to soft blood pressure readings-defer continuous IV fluids since renal failure and will give boluses if develops hypotension-continue antibiotics as above as well as antiviral medication-random cortisol 38-finally voided /urine cx pending-UA not c/w UTI    Atrial fibrillation with RVR Currently rate controlled-blood pressure soft in the 84-13 systolic range-appreciate cardiology assistance-cardiology notes unable to use amiodarone due to interaction with Pomolyst (and also with new transaminitis), renal failure precludes use of Tikosyn-EP has changed from CCB (contraindicated in amyloid CM) and instead have initiated Atenolol     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  Baseline hemoglobin anywhere between 7.9 and 9.2 and appears to require transfusions in the past-current hemoglobin 7.3 so continue to follow-hgb up to 9.7 after dialysis (volume removal)    Chronic kidney disease stage V requiring chronic dialysis/relative hypotension Nephrology 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-Nephro has begun Midodrine    Neutropenia, unspecified Baseline leukopenia with WBC around 3000 presumed related to ongoing chemotherapy-since admission WBC had decreased 1300 (likely related to underlying infectious process)-now subtle trend up  Thrombocytopenia Platelets have been normal most recent reading 214,000 on 9/15 and now with acute  thrombocytopenia with platelets 71,000-follow closely suspect related to sepsis + chemo--LDH 370;defer to Onco if Haptoglobin indicated    Multiple myeloma Oncology has seen pt this  am-chemotherapeutic agents on hold-random cortisol as above    Transaminitis Etiology uncertain but suspect from shock liver as well as underlying medications noting the chemotherapeutic agents as well as the patient's Septra can cause hepatotoxicity-total bilirubin normal and ALT higher than AST and trend is downward  Abnormal CXR -Reviewed today's CXR post HD with Dr. Jonnie Finner and concurred that patient requires chest CT without contrast. Nodular amyloidosis of the lung vs lung involvement of multiple myeloma?   DVT prophylaxis: SCDs Code Status: Full Family Communication: No other family at bedside Disposition Plan/Expected LOS: Stepdown  Consultants: Cardiology Nephrology EP Oncology  Procedures: None  Cultures: 9/20 cultures x2 pending 9/21 Urine cx pending  Antibiotics: Cefepime 9/20 >>> Acyclovir 9/20 >>> Vancomycin 9/20 >>>  Objective: Blood pressure 100/52, pulse 69, temperature 98.3 F (36.8 C), temperature source Oral, resp. rate 26, height 5' 2"  (1.575 m), weight 162 lb 14.7 oz (73.9 kg), last menstrual period 04/20/2011, SpO2 95.00%.  Intake/Output Summary (Last 24 hours) at 01/03/14 1227 Last data filed at 01/03/14 2094  Gross per 24 hour  Intake    606 ml  Output   2675 ml  Net  -2069 ml   Exam: Gen: No acute respiratory distress Chest: Improved air movement with bilateral exp crackles, room air Cardiac: Irregular rate and rhythm, S1-S2, no rubs murmurs or gallops, no peripheral edema, no JVD Abdomen: Soft nontender nondistended without obvious hepatosplenomegaly, no ascites Extremities: Symmetrical in appearance without cyanosis, clubbing or effusion  Scheduled Meds:  Scheduled Meds: . atenolol  25 mg Oral BID  . ceFEPime (MAXIPIME) IV  2 g Intravenous Q M,W,F-HD  .  docusate sodium  100 mg Oral BID  . feeding supplement (NEPRO CARB STEADY)  237 mL Oral BID BM  . ipratropium-albuterol  3 mL Nebulization Q6H  . midodrine  10 mg Oral Q T,Th,Sa-HD  . valACYclovir  500 mg Oral QODAY  . vancomycin  750 mg Intravenous Q M,W,F-HD  . warfarin  7.5 mg Oral ONCE-1800  . Warfarin - Pharmacist Dosing Inpatient   Does not apply q1800   Data Reviewed: Basic Metabolic Panel:  Recent Labs Lab 01/01/14 1356 01/02/14 0440 01/03/14 0440 01/03/14 1037  NA 135* 138  --  138  K 5.9* 4.5  --  3.7  CL 95* 99  --  98  CO2 23 25  --  28  GLUCOSE 106* 92  --  123*  BUN 60* 31*  --  38*  CREATININE 5.68* 3.27*  --  3.34*  CALCIUM 6.9* 6.7*  --  6.5*  MG  --   --  1.8  --   PHOS  --   --  2.4  --    Liver Function Tests:  Recent Labs Lab 01/02/14 0440 01/03/14 1037  AST 94* 51*  ALT 493* 297*  ALKPHOS 84 71  BILITOT 1.2 1.0  PROT 4.7* 4.6*  ALBUMIN 2.4* 2.1*   CBC:  Recent Labs Lab 01/01/14 1356 01/02/14 0440 01/03/14 0515 01/03/14 1037  WBC 2.0* 1.3* 9.2 2.0*  NEUTROABS 1.7 1.1*  --  1.3*  HGB 7.7* 7.3* 10.3* 9.7*  HCT 23.4* 22.2* 38.3 28.4*  MCV 91.1 92.9 86.3 88.8  PLT 87* 71* 253 64*   Cardiac Enzymes:  Recent Labs Lab 01/01/14 1553 01/01/14 2212 01/02/14 0440  TROPONINI <0.30 <0.30 <0.30   BNP (last 3 results)  Recent Labs  02/07/13 1442 04/29/13 1220 01/01/14 1320  PROBNP 597.10* 194.0* >70000.0*    Recent Results (from the past  240 hour(s))  CULTURE, BLOOD (ROUTINE X 2)     Status: None   Collection Time    01/01/14  1:53 PM      Result Value Ref Range Status   Specimen Description BLOOD RIGHT HAND   Final   Special Requests BOTTLES DRAWN AEROBIC AND ANAEROBIC 5 CC   Final   Culture  Setup Time     Final   Value: 01/01/2014 22:00     Performed at [facility]   Culture     Final   Value:        BLOOD CULTURE RECEIVED NO GROWTH TO DATE CULTURE WILL BE HELD FOR 5 DAYS BEFORE ISSUING A FINAL NEGATIVE REPORT      Performed at [facility]   Report Status PENDING   Incomplete  CULTURE, BLOOD (ROUTINE X 2)     Status: None   Collection Time    01/01/14  2:02 PM      Result Value Ref Range Status   Specimen Description BLOOD PORTA CATH   Final   Special Requests BOTTLES DRAWN AEROBIC AND ANAEROBIC 10 CC   Final   Culture  Setup Time     Final   Value: 01/01/2014 21:58     Performed at [facility]   Culture     Final   Value:        BLOOD CULTURE RECEIVED NO GROWTH TO DATE CULTURE WILL BE HELD FOR 5 DAYS BEFORE ISSUING A FINAL NEGATIVE REPORT     Note: Culture results may be compromised due to an excessive volume of blood received in culture bottles.     Performed at [facility]   Report Status PENDING   Incomplete  MRSA PCR SCREENING     Status: None   Collection Time    01/01/14  6:31 PM      Result Value Ref Range Status   MRSA by PCR NEGATIVE  NEGATIVE Final   Comment:            The GeneXpert MRSA Assay (FDA     approved for NASAL specimens     only), is one component of a     comprehensive MRSA colonization     surveillance program. It is not     intended to diagnose MRSA     infection nor to guide or     monitor treatment for     MRSA infections.     Studies:  Recent x-ray studies have been reviewed in detail by the Attending Physician  Time spent :  Miller, ANP [facility] Office  [phone] Pager [phone]  On-Call/Text Page:      [email or web address]      password [email or web address]  If 7PM-7AM, please contact night-coverage [email or web address] Password [facility] 01/03/2014, 12:27 PM   LOS: 2 days   Examined patient and discussed assessment and plan with ANP Ebony Hail and agree with the above Patient with multiple complex medical problems> 40 minutes spent in direct patient care

## 2014-01-03 NOTE — Progress Notes (Signed)
Subjective: She feels better.  No dizziness.   Objective: Vital signs in last 24 hours: Temp:  [98.3 F (36.8 C)-101.1 F (38.4 C)] 98.3 F (36.8 C) (09/22 0723) Pulse Rate:  [69-104] 69 (09/22 0400) Resp:  [18-28] 26 (09/22 0723) BP: (66-147)/(41-120) 100/52 mmHg (09/22 0723) SpO2:  [94 %-100 %] 95 % (09/22 0400) Weight:  [162 lb 14.7 oz (73.9 kg)] 162 lb 14.7 oz (73.9 kg) (09/21 1320) Last BM Date: 12/31/13  Intake/Output from previous day: 09/21 0701 - 09/22 0700 In: 746 [P.O.:120; I.V.:20; Blood:606] Out: 2675 [Urine:75] Intake/Output this shift:    Medications Current Facility-Administered Medications  Medication Dose Route Frequency Provider Last Rate Last Dose  . acetaminophen (TYLENOL) tablet 650 mg  650 mg Oral Q6H PRN Belkys A Regalado, MD   650 mg at 01/03/14 0519   Or  . acetaminophen (TYLENOL) suppository 650 mg  650 mg Rectal Q6H PRN Belkys A Regalado, MD      . atenolol (TENORMIN) tablet 25 mg  25 mg Oral BID Deboraha Sprang, MD   25 mg at 01/03/14 1012  . ceFEPIme (MAXIPIME) 2 g in dextrose 5 % 50 mL IVPB  2 g Intravenous Q M,W,F-HD Belkys A Regalado, MD   2 g at 01/02/14 1626  . docusate sodium (COLACE) capsule 100 mg  100 mg Oral BID Gardiner Barefoot, NP   100 mg at 01/03/14 1012  . feeding supplement (NEPRO CARB STEADY) liquid 237 mL  237 mL Oral BID BM Rogue Bussing, RD   237 mL at 01/03/14 1012  . HYDROcodone-acetaminophen (NORCO/VICODIN) 5-325 MG per tablet 1-2 tablet  1-2 tablet Oral Q4H PRN Belkys A Regalado, MD      . ipratropium-albuterol (DUONEB) 0.5-2.5 (3) MG/3ML nebulizer solution 3 mL  3 mL Nebulization Q6H Allie Bossier, MD      . midodrine (PROAMATINE) tablet 10 mg  10 mg Oral Q T,Th,Sa-HD Sol Blazing, MD      . ondansetron [facility]) tablet 4 mg  4 mg Oral Q6H PRN Belkys A Regalado, MD       Or  . ondansetron (ZOFRAN) injection 4 mg  4 mg Intravenous Q6H PRN Belkys A Regalado, MD      . polyethylene glycol (MIRALAX /  GLYCOLAX) packet 17 g  17 g Oral Daily PRN Gardiner Barefoot, NP   17 g at 01/02/14 2230  . valACYclovir (VALTREX) tablet 500 mg  500 mg Oral QODAY Belkys A Regalado, MD   500 mg at 01/03/14 1012  . vancomycin (VANCOCIN) IVPB 750 mg/150 ml premix  750 mg Intravenous Q M,W,F-HD Belkys A Regalado, MD   750 mg at 01/02/14 1528  . warfarin (COUMADIN) tablet 7.5 mg  7.5 mg Oral ONCE-1800 Allie Bossier, MD      . Warfarin - Pharmacist Dosing Inpatient   Does not apply q1800 Manley Mason, [facility]       Facility-Administered Medications Ordered in Other Encounters  Medication Dose Route Frequency Provider Last Rate Last Dose  . sodium chloride 0.9 % injection 10 mL  10 mL Intracatheter PRN Amy G Berry, PA-C   10 mL at 05/30/13 1640  . sodium chloride 0.9 % injection 10 mL  10 mL Intravenous PRN Chauncey Cruel, MD   10 mL at 08/08/13 1518    PE: General appearance: alert, cooperative and no distress Lungs: clear to auscultation bilaterally Heart: irregularly irregular rhythm Extremities: 3+ LEE Pulses: 2+ and symmetric Skin: Warm  and dry Neurologic: Grossly normal  Lab Results:   Recent Labs  01/02/14 0440 01/03/14 0515 01/03/14 1037  WBC 1.3* 9.2 2.0*  HGB 7.3* 10.3* 9.7*  HCT 22.2* 38.3 28.4*  PLT 71* 253 PENDING   BMET  Recent Labs  01/01/14 1356 01/02/14 0440  NA 135* 138  K 5.9* 4.5  CL 95* 99  CO2 23 25  GLUCOSE 106* 92  BUN 60* 31*  CREATININE 5.68* 3.27*  CALCIUM 6.9* 6.7*   PT/INR  Recent Labs  01/01/14 1356 01/03/14 0515  LABPROT 21.7* 21.6*  INR 1.89* 1.88*    Assessment/Plan   58 year old woman with multiple myeloma and renal failure who presents with increasing shortness of breath and weakness.  Active Problems:  1. persistent atrial fibrillation,  status post unsuccessful attempt at direct current cardioversion on 12/29/13 x5.  Maintaining Afib with a rate in the 70's.  EP consulted yesterday-see note. Trying alternative beta blocker-atenolol  25. Previously beta blocker made her feel weak. Cardizem DCd.  On coumadin.  INR 1.88 today.   BP improved.  Continue rate control therapy.  2. significant mitral regurgitation of recent onset   Will need to follow in the office.  3. left upper lobe pneumonia  Cefepime and vanc.  4. stage V renal failure on dialysis  HD yesterday  5. severe anemia   Hgb 9.7 after two units PRBCs.  6. multiple myeloma  7. Elevated LFTs.    LOS: 2 days    HAGER, BRYAN PA-C 01/03/2014 11:13 AM  Patient seen and examined and history reviewed. Agree with above findings and plan. Feeling much better. Tolerated dialysis well yesterday. HR control improved on atenolol 25 mg bid. Off diltiazem. On coumadin for anticoagulation. Seems to be tolerating atenolol well. Will continue rate control strategy for now.  Peter Martinique, [place] 01/03/2014 12:45 PM

## 2014-01-03 NOTE — Progress Notes (Signed)
ANTICOAGULATION CONSULT NOTE - Follow Up Consult  Pharmacy Consult for Coumadin Indication: atrial fibrillation  Allergies  Allergen Reactions  . Wellbutrin [Bupropion Hcl] Hives   Labs:  Recent Labs  01/01/14 1356 01/01/14 1553 01/01/14 2212 01/02/14 0440 01/03/14 0515  HGB 7.7*  --   --  7.3* 10.3*  HCT 23.4*  --   --  22.2* 38.3  PLT 87*  --   --  71* 253  LABPROT 21.7*  --   --   --  21.6*  INR 1.89*  --   --   --  1.88*  CREATININE 5.68*  --   --  3.27*  --   TROPONINI  --  <0.30 <0.30 <0.30  --     Estimated Creatinine Clearance: 17.9 ml/min (by C-G formula based on Cr of 3.27).  Assessment: 58 year old female on Coumadin PTA for Afib Also with multiple myeloma, ESRD Antibiotics continue for pneumonia INR = 1.89 on admission INR still low today  Goal of Therapy:  INR 2-3 Monitor platelets by anticoagulation protocol: Yes   Plan:  Coumadin 7.5 mg po x 1 today Daily INR  Thank you. Anette Guarneri, PharmD [phone]  Tad Moore 01/03/2014,9:02 AM

## 2014-01-03 NOTE — Progress Notes (Signed)
[facility] Progress Note   Subjective: coughing, dry cough , no recurrent SOB.  CXR today with progressive nodular  Filed Vitals:   01/03/14 0723 01/03/14 1236 01/03/14 1315 01/03/14 1510  BP: 100/52   126/76  Pulse:    84  Temp: 98.3 F (36.8 C) 98.2 F (36.8 C)    TempSrc: Oral Oral    Resp: 26   14  Height:      Weight:      SpO2:   100% 98%   Exam: Alert, no distress TDC left chest No JVD  Occ scattered crackles, mostly clear though Irreg irreg S1S2 No S3 1/6 murmur apex Abdomen: soft, without focal tenderness  2+ pitting bilat LE edema to the hips  Neuro is alert, Ox3, no focal deficit  HD: MWF [place] 3.5h  73kg   3K/2.50 [place]  IJ Catheter   Heparin 500 (five hundred) units   No ESA (per [facility] oncology)      Assessment: 1 Dyspnea / fever / LUL mass vs infiltrate / nodular infiltrates - this is not fluid, could have atypical infection, amyloid, extramedullary myeloma, septic emboli, other, not sure 3 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, started HD 2-3 months ago 4 IgA lambda LC myeloma - hx of prior SCT 2013, then recurrence; active chemoRx on hold now 5 Anemia s/p transfusion 6 Afib on coumadin, failed attempt to cardiovert last week - cardiology rec different BB 7 Mitral valve regurgitation- new finding on TEE, EF preserved, blood cx's negative 8 Hypotension- better today, BP over 100, 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 9 Vol excess / LE edema - pull vol as BP tolerates with HD, pre-Rx with midodrine 10 mg before HD   Plan- HD tomorrow, chest CT noncontrast tonight, have d/w primary MD     Kelly Splinter MD  pager [phone]    cell [phone]  01/03/2014, 4:17 PM     Recent Labs Lab 01/01/14 1356 01/02/14 0440 01/03/14 0440 01/03/14 1037  NA 135* 138  --  138  K 5.9* 4.5  --  3.7  CL 95* 99  --  98  CO2 23 25  --  28  GLUCOSE 106* 92  --  123*  BUN 60* 31*  --  38*  CREATININE 5.68* 3.27*  --   3.34*  CALCIUM 6.9* 6.7*  --  6.5*  PHOS  --   --  2.4  --     Recent Labs Lab 01/02/14 0440 01/03/14 1037  AST 94* 51*  ALT 493* 297*  ALKPHOS 84 71  BILITOT 1.2 1.0  PROT 4.7* 4.6*  ALBUMIN 2.4* 2.1*    Recent Labs Lab 01/01/14 1356 01/02/14 0440 01/03/14 0515 01/03/14 1037  WBC 2.0* 1.3* 9.2 2.0*  NEUTROABS 1.7 1.1*  --  1.3*  HGB 7.7* 7.3* 10.3* 9.7*  HCT 23.4* 22.2* 38.3 28.4*  MCV 91.1 92.9 86.3 88.8  PLT 87* 71* 253 64*   . atenolol  25 mg Oral BID  . ceFEPime (MAXIPIME) IV  2 g Intravenous Q M,W,F-HD  . docusate sodium  100 mg Oral BID  . feeding supplement (NEPRO CARB STEADY)  237 mL Oral BID BM  . ipratropium-albuterol  3 mL Nebulization Q6H  . midodrine  10 mg Oral Q T,Th,Sa-HD  . valACYclovir  500 mg Oral QODAY  . vancomycin  750 mg Intravenous Q M,W,F-HD  . warfarin  7.5 mg Oral ONCE-1800  . Warfarin -  Pharmacist Dosing Inpatient   Does not apply q1800     acetaminophen, acetaminophen, HYDROcodone-acetaminophen, ondansetron (ZOFRAN) IV, ondansetron, polyethylene glycol

## 2014-01-04 DIAGNOSIS — N179 Acute kidney failure, unspecified: Secondary | ICD-10-CM

## 2014-01-04 DIAGNOSIS — C9200 Acute myeloblastic leukemia, not having achieved remission: Secondary | ICD-10-CM

## 2014-01-04 DIAGNOSIS — C9002 Multiple myeloma in relapse: Secondary | ICD-10-CM

## 2014-01-04 DIAGNOSIS — R7402 Elevation of levels of lactic acid dehydrogenase (LDH): Secondary | ICD-10-CM

## 2014-01-04 DIAGNOSIS — R740 Nonspecific elevation of levels of transaminase and lactic acid dehydrogenase [LDH]: Secondary | ICD-10-CM

## 2014-01-04 LAB — URINE CULTURE: Colony Count: >=100000

## 2014-01-04 LAB — COMPREHENSIVE METABOLIC PANEL
ALT: 208 U/L — ABNORMAL HIGH (ref 0–35)
AST: 51 U/L — AB (ref 0–37)
Albumin: 1.9 g/dL — ABNORMAL LOW (ref 3.5–5.2)
Alkaline Phosphatase: 62 U/L (ref 39–117)
Anion gap: 15 (ref 5–15)
BILIRUBIN TOTAL: 0.7 mg/dL (ref 0.3–1.2)
BUN: 54 mg/dL — ABNORMAL HIGH (ref 6–23)
CO2: 24 mEq/L (ref 19–32)
CREATININE: 4.52 mg/dL — AB (ref 0.50–1.10)
Calcium: 5.9 mg/dL — CL (ref 8.4–10.5)
Chloride: 94 mEq/L — ABNORMAL LOW (ref 96–112)
GFR calc Af Amer: 11 mL/min — ABNORMAL LOW (ref >90)
GFR calc non Af Amer: 10 mL/min — ABNORMAL LOW (ref >90)
Glucose, Bld: 103 mg/dL — ABNORMAL HIGH (ref 70–99)
POTASSIUM: 3.8 meq/L (ref 3.7–5.3)
Sodium: 133 mEq/L — ABNORMAL LOW (ref 137–147)
Total Protein: 4.3 g/dL — ABNORMAL LOW (ref 6.0–8.3)

## 2014-01-04 LAB — CBC
HEMATOCRIT: 28 % — AB (ref 36.0–46.0)
HEMOGLOBIN: 9.7 g/dL — AB (ref 12.0–15.0)
MCH: 30.5 pg (ref 26.0–34.0)
MCHC: 34.6 g/dL (ref 30.0–36.0)
MCV: 88.1 fL (ref 78.0–100.0)
Platelets: 59 10*3/uL — ABNORMAL LOW (ref 150–400)
RBC: 3.18 MIL/uL — AB (ref 3.87–5.11)
RDW: 19 % — ABNORMAL HIGH (ref 11.5–15.5)
WBC: 2.8 10*3/uL — AB (ref 4.0–10.5)

## 2014-01-04 LAB — PROTIME-INR
INR: 2.28 — AB (ref 0.00–1.49)
PROTHROMBIN TIME: 25.1 s — AB (ref 11.6–15.2)

## 2014-01-04 MED ORDER — SODIUM CHLORIDE 0.9 % IV SOLN
2.0000 g | Freq: Once | INTRAVENOUS | Status: AC
  Administered 2014-01-04: 2 g via INTRAVENOUS
  Filled 2014-01-04: qty 20

## 2014-01-04 MED ORDER — IPRATROPIUM-ALBUTEROL 0.5-2.5 (3) MG/3ML IN SOLN
3.0000 mL | Freq: Three times a day (TID) | RESPIRATORY_TRACT | Status: DC
  Administered 2014-01-04 – 2014-01-05 (×5): 3 mL via RESPIRATORY_TRACT
  Filled 2014-01-04 (×7): qty 3

## 2014-01-04 MED ORDER — CALCIUM CARBONATE ANTACID 500 MG PO CHEW
2.0000 | CHEWABLE_TABLET | Freq: Three times a day (TID) | ORAL | Status: DC
Start: 2014-01-04 — End: 2014-01-06
  Administered 2014-01-04 – 2014-01-06 (×7): 400 mg via ORAL
  Filled 2014-01-04 (×9): qty 2

## 2014-01-04 MED ORDER — WARFARIN SODIUM 2.5 MG PO TABS
2.5000 mg | ORAL_TABLET | Freq: Once | ORAL | Status: AC
  Administered 2014-01-04: 2.5 mg via ORAL
  Filled 2014-01-04 (×2): qty 1

## 2014-01-04 MED ORDER — SODIUM CHLORIDE 0.9 % IJ SOLN
10.0000 mL | INTRAMUSCULAR | Status: DC | PRN
  Administered 2014-01-04: 10 mL
  Administered 2014-01-04: 20 mL
  Administered 2014-01-06: 10 mL

## 2014-01-04 MED ORDER — DOXERCALCIFEROL 4 MCG/2ML IV SOLN
INTRAVENOUS | Status: AC
  Administered 2014-01-04: 3 ug
  Filled 2014-01-04: qty 2

## 2014-01-04 MED ORDER — SODIUM CHLORIDE 0.9 % IV SOLN
250.0000 mg | Freq: Two times a day (BID) | INTRAVENOUS | Status: DC
  Administered 2014-01-04 – 2014-01-06 (×4): 250 mg via INTRAVENOUS
  Filled 2014-01-04 (×6): qty 250

## 2014-01-04 MED ORDER — DOXERCALCIFEROL 4 MCG/2ML IV SOLN
3.0000 ug | INTRAVENOUS | Status: DC
Start: 2014-01-04 — End: 2014-01-06
  Administered 2014-01-06: 3 ug via INTRAVENOUS
  Filled 2014-01-04 (×2): qty 2

## 2014-01-04 NOTE — Progress Notes (Addendum)
Subjective: She feels better.  Ambulating with less dyspnea. No dizziness.   Objective: Vital signs in last 24 hours: Temp:  [98.2 F (36.8 C)-99.7 F (37.6 C)] 98.9 F (37.2 C) (09/23 0700) Pulse Rate:  [81-94] 81 (09/23 0700) Resp:  [14-20] 20 (09/23 0700) BP: (98-126)/(50-76) 98/70 mmHg (09/23 0700) SpO2:  [96 %-100 %] 96 % (09/23 0700) Last BM Date: 01/03/14  Telemetry. AFib with controlled rate in the 80s  Intake/Output from previous day: 09/22 0701 - 09/23 0700 In: 120 [P.O.:120] Out: -  Intake/Output this shift:    Medications Current Facility-Administered Medications  Medication Dose Route Frequency Provider Last Rate Last Dose  . acetaminophen (TYLENOL) tablet 650 mg  650 mg Oral Q6H PRN Belkys A Regalado, MD   650 mg at 01/03/14 0519   Or  . acetaminophen (TYLENOL) suppository 650 mg  650 mg Rectal Q6H PRN Belkys A Regalado, MD      . atenolol (TENORMIN) tablet 25 mg  25 mg Oral BID Deboraha Sprang, MD   25 mg at 01/03/14 2304  . calcium carbonate (TUMS - dosed in mg elemental calcium) chewable tablet 400 mg of elemental calcium  2 tablet Oral TID BM Sol Blazing, MD      . calcium gluconate 2 g in sodium chloride 0.9 % 100 mL IVPB  2 g Intravenous Once Sol Blazing, MD      . ceFEPIme (MAXIPIME) 2 g in dextrose 5 % 50 mL IVPB  2 g Intravenous Q M,W,F-HD Belkys A Regalado, MD   2 g at 01/02/14 1626  . docusate sodium (COLACE) capsule 100 mg  100 mg Oral BID Gardiner Barefoot, NP   100 mg at 01/03/14 2303  . doxercalciferol (HECTOROL) injection 3 mcg  3 mcg Intravenous Q M,W,F-HD Sol Blazing, MD      . feeding supplement (NEPRO CARB STEADY) liquid 237 mL  237 mL Oral BID BM Rogue Bussing, RD   237 mL at 01/03/14 1757  . HYDROcodone-acetaminophen (NORCO/VICODIN) 5-325 MG per tablet 1-2 tablet  1-2 tablet Oral Q4H PRN Belkys A Regalado, MD      . ipratropium-albuterol (DUONEB) 0.5-2.5 (3) MG/3ML nebulizer solution 3 mL  3 mL Nebulization TID  Allie Bossier, MD      . midodrine (PROAMATINE) tablet 10 mg  10 mg Oral Q M,W,F-HD Sol Blazing, MD      . ondansetron [facility]) tablet 4 mg  4 mg Oral Q6H PRN Belkys A Regalado, MD       Or  . ondansetron (ZOFRAN) injection 4 mg  4 mg Intravenous Q6H PRN Belkys A Regalado, MD      . polyethylene glycol (MIRALAX / GLYCOLAX) packet 17 g  17 g Oral Daily PRN Gardiner Barefoot, NP   17 g at 01/02/14 2230  . sodium chloride 0.9 % injection 10-40 mL  10-40 mL Intracatheter PRN Allie Bossier, MD   10 mL at 01/04/14 0458  . valACYclovir (VALTREX) tablet 500 mg  500 mg Oral QODAY Belkys A Regalado, MD   500 mg at 01/03/14 1012  . vancomycin (VANCOCIN) IVPB 750 mg/150 ml premix  750 mg Intravenous Q M,W,F-HD Belkys A Regalado, MD   750 mg at 01/02/14 1528  . Warfarin - Pharmacist Dosing Inpatient   Does not apply q1800 Manley Mason, [facility]       Facility-Administered Medications Ordered in Other Encounters  Medication Dose Route Frequency Provider Last Rate Last  Dose  . sodium chloride 0.9 % injection 10 mL  10 mL Intracatheter PRN Amy G Berry, PA-C   10 mL at 05/30/13 1640  . sodium chloride 0.9 % injection 10 mL  10 mL Intravenous PRN Chauncey Cruel, MD   10 mL at 08/08/13 1518    PE: General appearance: alert, cooperative and no distress Lungs: clear to auscultation bilaterally Heart: irregularly irregular rhythm Extremities: 2+ edema Pulses: 2+ and symmetric Skin: Warm and dry Neurologic: Grossly normal  Lab Results:   Recent Labs  01/02/14 0440 01/03/14 0515 01/03/14 1037  WBC 1.3* 9.2 2.0*  HGB 7.3* 10.3* 9.7*  HCT 22.2* 38.3 28.4*  PLT 71* 253 64*   BMET  Recent Labs  01/02/14 0440 01/03/14 1037 01/04/14 0452  NA 138 138 133*  K 4.5 3.7 3.8  CL 99 98 94*  CO2 25 28 24   GLUCOSE 92 123* 103*  BUN 31* 38* 54*  CREATININE 3.27* 3.34* 4.52*  CALCIUM 6.7* 6.5* 5.9*   PT/INR  Recent Labs  01/01/14 1356 01/03/14 0515 01/04/14 0452  LABPROT 21.7* 21.6*  25.1*  INR 1.89* 1.88* 2.28*    Assessment/Plan   58 year old woman with multiple myeloma and renal failure who presents with increasing shortness of breath and weakness.   Active Problems:  1. Persistent atrial fibrillation,  status post unsuccessful attempt at direct current cardioversion on 12/29/13 x5.  Maintaining Afib with a rate in the 70-80's.  EP consulted see note. Trying alternative beta blocker-atenolol 25 bid. Tolerating well and rate controlled.  Cardizem DCd.  On coumadin.  INR 2.28 today.   BP improved.  Continue rate control therapy. I will sign off now. Patient has a follow up appointment with Dr. Johnsie Cancel on October 6.   2. significant mitral regurgitation of recent onset   Will need to follow in the office.  3. left upper lobe pneumonia  Cefepime and vanc.  4. stage V renal failure on dialysis  HD yesterday  5. severe anemia   Hgb 9.7 after two units PRBCs.  6. multiple myeloma  7. Elevated LFTs.    LOS: 3 days       Laura Hester, [place] 01/04/2014 7:46 AM

## 2014-01-04 NOTE — Progress Notes (Signed)
ANTIBIOTIC CONSULT NOTE - Follow Up Consult  Pharmacy Consult for Imipenem Indication:  HCAP  Allergies  Allergen Reactions  . Wellbutrin [Bupropion Hcl] Hives    Patient Measurements: Height: 5\' 2"  (157.5 cm) Weight: 162 lb 14.7 oz (73.9 kg) IBW/kg (Calculated) : 50.1  Vital Signs: Temp: 98.9 F (37.2 C) (09/23 1450) Temp src: Oral (09/23 1450) BP: 111/57 mmHg (09/23 1600) Pulse Rate: 92 (09/23 1600)  Labs:  Recent Labs  01/01/14 2212  01/02/14 0440 01/03/14 0515 01/03/14 1037 01/04/14 0452 01/04/14 1522  HGB  --   < > 7.3* 10.3* 9.7*  --  9.7*  HCT  --   < > 22.2* 38.3 28.4*  --  28.0*  PLT  --   < > 71* 253 64*  --  59*  LABPROT  --   --   --  21.6*  --  25.1*  --   INR  --   --   --  1.88*  --  2.28*  --   CREATININE  --   --  3.27*  --  3.34* 4.52*  --   TROPONINI <0.30  --  <0.30  --   --   --   --   < > = values in this interval not displayed.  Estimated Creatinine Clearance: 12.9 ml/min (by C-G formula based on Cr of 4.52).   Assessment: The patient antibiotics will be broadened.  Cefepime( D#4) will be changed to imipenem. Goal of Therapy:  Proper antibiotics for infection/cultures adjusted for renal/hepatic function    Plan:  Imipenem 250 mg IV q12 hours F/u clinical course  Excell Seltzer, PharmD Clinical Pharmacist Pager: [phone] 01/04/2014 4:50 PM

## 2014-01-04 NOTE — Progress Notes (Signed)
PT Cancellation Note  Patient Details Name: Laura Hester MRN: [id number] DOB: 03-20-1956   Cancelled Treatment:    Reason Eval/Treat Not Completed: Patient at procedure or test/unavailable.  In HD this P.M.  Will see tomorrow. 01/04/2014  Donnella Sham, [place] [phone]  (pager)   Krystyl Cannell, Tessie Fass 01/04/2014, 4:20 PM

## 2014-01-04 NOTE — Progress Notes (Signed)
ANTICOAGULATION & ANTIBIOTIC CONSULT NOTE - Follow Up Consult  Pharmacy Consult for Warfarin & Vancomycin + Cefepime Indication: Afib & r/o HCAP  Allergies  Allergen Reactions  . Wellbutrin [Bupropion Hcl] Hives    Patient Measurements: Height: 5\' 2"  (157.5 cm) Weight: 162 lb 14.7 oz (73.9 kg) IBW/kg (Calculated) : 50.1  Vital Signs: Temp: 98.9 F (37.2 C) (09/23 0700) Temp src: Oral (09/23 0700) BP: 121/60 mmHg (09/23 1333) Pulse Rate: 74 (09/23 1333)  Labs:  Recent Labs  01/01/14 1356 01/01/14 1553 01/01/14 2212 01/02/14 0440 01/03/14 0515 01/03/14 1037 01/04/14 0452  HGB 7.7*  --   --  7.3* 10.3* 9.7*  --   HCT 23.4*  --   --  22.2* 38.3 28.4*  --   PLT 87*  --   --  71* 253 64*  --   LABPROT 21.7*  --   --   --  21.6*  --  25.1*  INR 1.89*  --   --   --  1.88*  --  2.28*  CREATININE 5.68*  --   --  3.27*  --  3.34* 4.52*  TROPONINI  --  <0.30 <0.30 <0.30  --   --   --     Estimated Creatinine Clearance: 12.9 ml/min (by C-G formula based on Cr of 4.52).   Assessment: 67 YOF who continues on warfarin for Afib s/p unsuccessfully DCCV on 9/17 with a therapeutic INR this morning (INR 2.28 << 1.88, goal of 2-3). The patient is holding oral chemo meds for MM - which can reduce warfarin sensitivity however is noted to have elevated LFTs which can increase warfarin sensitivity. Will continue dosing cautiously - will reduce dose today.  The patient continues on Vancomycin + Cefepime abx D#4 for r/o HCAP. The patient was loaded with Vancomycin on 9/20 and has received appropriate maintenance doses after HD on 9/20 & 9/21 - plans are for HD again today. Doses remain appropriate for now.  Goal of Therapy:  INR 2-3 Proper antibiotics for infection/cultures adjusted for renal/hepatic function    Plan:  1. Warfarin 2.5 mg x 1 dose at 1800 today 2. Continue Vancomycin 750 mg post HD-MWF 3. Continue Cefepime 2g IV post HD-MWF  Alycia Rossetti, PharmD, BCPS Clinical  Pharmacist Pager: [phone] 01/04/2014 1:43 PM

## 2014-01-04 NOTE — Progress Notes (Signed)
Pt tx from [place] [facility].   Arrival Method: via wheelchair  Mental Orientation: Alert and Oriented x4 Telemetry: Placed on box #17 Assessment: Completed Skin: Intact IV: Right chest Portacath Accessed ; Left Chest HD Cath Pain: 0/10 Tubes: N/A Safety Measures: Safety Fall Prevention Plan has been given and discussed [place] Orientation: Patient has been orientated to the room, unit and staff.  Family: Daughter at bedside  Orders have been reviewed and implemented. Will continue to monitor the patient. Call light has been placed within reach and bed alarm has been activated.    Dorothea Glassman, RN  Phone number: [phone]

## 2014-01-04 NOTE — Progress Notes (Signed)
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 Jana Hakim locally and a Dr. Melba Coon in [place]. She'd experienced progressive renal failure do to the multiple myeloma and was a recent start on hemodialysis. She also has underlying history of atrial fibrillation on Coumadin with a recent failed attempt at cardioversion on 9/17 2015. She also has a diagnosis of moderate to severe mitral regurg with normal LV function. Since the recent cardioversion attempts she has noted progressive weakness, dyspnea on exertion, cough without sputum as well as chills for 2 days with fever.  Upon presentation to the emergency department she was found to have potassium of 5.9, hemoglobin 7.7, white count 2000, and a chest x-ray with dense left upper lobe consolidation with concomitant left lower lobe atelectasis versus opacity as well. Admitting evaluation was consistent with likely pneumonia. She's been started on broad-spectrum antibiotics to cover for HCAP in an immunocompromised patient. Oncology was consulted. Chemotherapy currently on hold. Nephrologist has been consulted regarding continuing patient's usual hemodialysis  Since admission despite the severity of her chest x-ray findings patient has been relatively stable. Follow up CXR 9/22 significantly improved. Her blood pressure was a little soft but has improved after Nephro added Midodrine. She has not required oxygen. She has a new transaminitis (elevated AST/ALT) with normal total bilirubin but current trend is downward. On 9/15 LFTs were completely normal. Cardiology has been consulted as well as EP regarding the atrial fibrillation. Cardizem has been dc'd in favor of Atenolol. Dr Jana Hakim has also evaluated the patient.    HPI/Subjective: Feeling better   Assessment/Plan:     HCAP (healthcare-associated pneumonia) Currently not hypoxemic despite significant dense infiltrate left upper lobe-add incentive spirometry and flutter valve-continue empiric broad-spectrum antibiotics-agree with holding chemotherapy at this juncture -ask ID to see-- ? Fungal involvement -CT Scan done 9/22 showed: Multilobar pneumonia with dense consolidation in the left upper lobe. Atypical infection including fungal infection is in the differential diagnosis. Small lucent lesions within the sternum and thoracic spine vertebral bodies consistent with patient's known history of multiple myeloma. 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  Baseline hemoglobin anywhere between 7.9 and 9.2 and appears to require transfusions in the past-current hemoglobin 7.3 so continue to follow-hgb up to 9.7 after dialysis (volume removal)    Chronic kidney disease stage V requiring chronic dialysis/relative hypotension Nephrology 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-Nephro has begun Midodrine    Neutropenia, unspecified Baseline leukopenia with WBC around 3000 presumed related to ongoing chemotherapy  Thrombocytopenia Platelets have been normal most recent reading 214,000 on 9/15 and now with acute thrombocytopenia with platelets 71,000-follow closely suspect related to sepsis + chemo--LDH 370;defer to Onco if Haptoglobin indicated    Multiple myeloma Oncology has seen pt this  am-chemotherapeutic agents on hold    Transaminitis Etiology uncertain but suspect from shock liver as well as underlying medications noting the chemotherapeutic agents as well as  the patient's Septra can cause hepatotoxicity-total bilirubin normal and ALT higher than AST and trend is downward     DVT prophylaxis: SCDs Code Status: Partial Family Communication: No other family at bedside Disposition Plan/Expected LOS:   Consultants: Cardiology Nephrology EP Oncology  Procedures: None  Cultures: 9/20 cultures x2 pending 9/21 Urine cx pending  Antibiotics: Cefepime 9/20 >>> Acyclovir 9/20 >>> Vancomycin 9/20 >>>  Objective: Blood pressure 98/70, pulse 81, temperature 98.9 F (37.2 C), temperature source Oral, resp. rate 20, height 5' 2"  (1.575 m), weight 73.9 kg (162 lb 14.7 oz), last menstrual period 04/20/2011, SpO2 96.00%.  Intake/Output Summary (Last 24 hours) at 01/04/14 0842 Last data filed at 01/03/14 1447  Gross per 24 hour  Intake    120 ml  Output      0 ml  Net    120 ml   Exam: Gen: No acute respiratory distress Chest: no wheezing, room air Cardiac: Irregular rate and rhythm, S1-S2, no rubs murmurs or gallops, no peripheral edema, no JVD Abdomen: Soft nontender nondistended without obvious hepatosplenomegaly, no ascites Extremities: Symmetrical in appearance without cyanosis, clubbing or effusion  Scheduled Meds:  Scheduled Meds: . atenolol  25 mg Oral BID  . calcium carbonate  2 tablet Oral TID BM  . calcium gluconate  2 g Intravenous Once  . ceFEPime (MAXIPIME) IV  2 g Intravenous Q M,W,F-HD  . docusate sodium  100 mg Oral BID  . doxercalciferol  3 mcg Intravenous Q M,W,F-HD  . feeding supplement (NEPRO CARB STEADY)  237 mL Oral BID BM  . ipratropium-albuterol  3 mL Nebulization TID  . midodrine  10 mg Oral Q M,W,F-HD  . valACYclovir  500 mg Oral QODAY  . vancomycin  750 mg Intravenous Q M,W,F-HD  . Warfarin - Pharmacist Dosing Inpatient    Does not apply q1800   Data Reviewed: Basic Metabolic Panel:  Recent Labs Lab 01/01/14 1356 01/02/14 0440 01/03/14 0440 01/03/14 1037 01/04/14 0452  NA 135* 138  --  138 133*  K 5.9* 4.5  --  3.7 3.8  CL 95* 99  --  98 94*  CO2 23 25  --  28 24  GLUCOSE 106* 92  --  123* 103*  BUN 60* 31*  --  38* 54*  CREATININE 5.68* 3.27*  --  3.34* 4.52*  CALCIUM 6.9* 6.7*  --  6.5* 5.9*  MG  --   --  1.8  --   --   PHOS  --   --  2.4  --   --    Liver Function Tests:  Recent Labs Lab 01/02/14 0440 01/03/14 1037 01/04/14 0452  AST 94* 51* 51*  ALT 493* 297* 208*  ALKPHOS 84 71 62  BILITOT 1.2 1.0 0.7  PROT 4.7* 4.6* 4.3*  ALBUMIN 2.4* 2.1* 1.9*   CBC:  Recent Labs Lab 01/01/14 1356 01/02/14 0440 01/03/14 0515 01/03/14 1037  WBC 2.0* 1.3* 9.2 2.0*  NEUTROABS 1.7 1.1*  --  1.3*  HGB 7.7* 7.3* 10.3* 9.7*  HCT 23.4* 22.2* 38.3 28.4*  MCV 91.1 92.9 86.3 88.8  PLT 87* 71* 253 64*   Cardiac Enzymes:  Recent Labs Lab 01/01/14 1553 01/01/14 2212 01/02/14 0440  TROPONINI <0.30 <0.30 <0.30   BNP (last 3 results)  Recent Labs  02/07/13 1442 04/29/13 1220 01/01/14 1320  PROBNP 597.10* 194.0* >70000.0*    Recent Results (from the past 240 hour(s))  CULTURE, BLOOD (ROUTINE X 2)     Status: None  Collection Time    01/01/14  1:53 PM      Result Value Ref Range Status   Specimen Description BLOOD RIGHT HAND   Final   Special Requests BOTTLES DRAWN AEROBIC AND ANAEROBIC 5 CC   Final   Culture  Setup Time     Final   Value: 01/01/2014 22:00     Performed at [facility]   Culture     Final   Value:        BLOOD CULTURE RECEIVED NO GROWTH TO DATE CULTURE WILL BE HELD FOR 5 DAYS BEFORE ISSUING A FINAL NEGATIVE REPORT     Performed at [facility]   Report Status PENDING   Incomplete  CULTURE, BLOOD (ROUTINE X 2)     Status: None   Collection Time    01/01/14  2:02 PM      Result Value Ref Range Status   Specimen Description BLOOD PORTA CATH    Final   Special Requests BOTTLES DRAWN AEROBIC AND ANAEROBIC 10 CC   Final   Culture  Setup Time     Final   Value: 01/01/2014 21:58     Performed at [facility]   Culture     Final   Value:        BLOOD CULTURE RECEIVED NO GROWTH TO DATE CULTURE WILL BE HELD FOR 5 DAYS BEFORE ISSUING A FINAL NEGATIVE REPORT     Note: Culture results may be compromised due to an excessive volume of blood received in culture bottles.     Performed at [facility]   Report Status PENDING   Incomplete  MRSA PCR SCREENING     Status: None   Collection Time    01/01/14  6:31 PM      Result Value Ref Range Status   MRSA by PCR NEGATIVE  NEGATIVE Final   Comment:            The GeneXpert MRSA Assay (FDA     approved for NASAL specimens     only), is one component of a     comprehensive MRSA colonization     surveillance program. It is not     intended to diagnose MRSA     infection nor to guide or     monitor treatment for     MRSA infections.     Studies:  Recent x-ray studies have been reviewed in detail by the Attending Physician  Time spent :  25 mins  Eulogio Bear DO [facility] Pager [phone]  On-Call/Text Page:      [email or web address]      password [email or web address]  If 7PM-7AM, please contact night-coverage [email or web address] Password [email or web address] 01/04/2014, 8:42 AM   LOS: 3 days

## 2014-01-04 NOTE — Progress Notes (Signed)
[facility] Progress Note   Subjective: Chest CT showing multifocal PNA with dense LUL consolidation. Ca down to 5.9  Filed Vitals:   01/03/14 1510 01/03/14 1730 01/03/14 2045 01/03/14 2100  BP: 126/76   117/50  Pulse: 84   94  Temp:  99.7 F (37.6 C)  99.4 F (37.4 C)  TempSrc:  Axillary  Oral  Resp: 14   20  Height:      Weight:      SpO2: 98%  98% 98%   Exam: Alert, no distress, coughing off and on, pale TDC left chest No JVD  Occ scattered crackles, mostly clear Irreg irreg S1S2 No S3 1/6 murmur apex Abdomen: soft, without focal tenderness  2+ pitting bilat LE edema Neuro is alert, Ox3, no focal deficit  HD: MWF [place] 3.5h  73kg   3K/2.50 [place]  IJ Catheter   Heparin 500 (five hundred) units   No ESA (per [facility] oncology)      Assessment: 1 Fever / multifocal PNA - on IV AB, ID consult pending today 3 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 4 IgA lambda LC myeloma - hx of prior SCT 2013, then recurrence; active chemoRx on hold now 5 Hypocalcemia - progressive, asymptomatic, due to CKD and assoc vit D deficiency 6 Afib on coumadin - rate control w atenolol 7 MV regurgitation- new finding on TEE, EF preserved, blood cx's negative 8 Hypotension- better today, BP over 100, 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 9 Vol excess / LE edema - pre-Rx with midodrine 10 mg before HD 10 Anemia Hb 9.7, no ESA due to malignancy    Plan- HD today, IV vit D and IV +po Ca, UF as BP tolerates    Kelly Splinter MD  pager [phone]    cell [phone]  01/04/2014, 7:11 AM     Recent Labs Lab 01/02/14 0440 01/03/14 0440 01/03/14 1037 01/04/14 0452  NA 138  --  138 133*  K 4.5  --  3.7 3.8  CL 99  --  98 94*  CO2 25  --  28 24  GLUCOSE 92  --  123* 103*  BUN 31*  --  38* 54*  CREATININE 3.27*  --  3.34* 4.52*  CALCIUM 6.7*  --  6.5* 5.9*  PHOS  --  2.4  --   --     Recent Labs Lab 01/02/14 0440 01/03/14 1037  01/04/14 0452  AST 94* 51* 51*  ALT 493* 297* 208*  ALKPHOS 84 71 62  BILITOT 1.2 1.0 0.7  PROT 4.7* 4.6* 4.3*  ALBUMIN 2.4* 2.1* 1.9*    Recent Labs Lab 01/01/14 1356 01/02/14 0440 01/03/14 0515 01/03/14 1037  WBC 2.0* 1.3* 9.2 2.0*  NEUTROABS 1.7 1.1*  --  1.3*  HGB 7.7* 7.3* 10.3* 9.7*  HCT 23.4* 22.2* 38.3 28.4*  MCV 91.1 92.9 86.3 88.8  PLT 87* 71* 253 64*   . atenolol  25 mg Oral BID  . ceFEPime (MAXIPIME) IV  2 g Intravenous Q M,W,F-HD  . docusate sodium  100 mg Oral BID  . feeding supplement (NEPRO CARB STEADY)  237 mL Oral BID BM  . ipratropium-albuterol  3 mL Nebulization TID  . midodrine  10 mg Oral Q M,W,F-HD  . valACYclovir  500 mg Oral QODAY  . vancomycin  750 mg Intravenous Q M,W,F-HD  . Warfarin - Pharmacist Dosing Inpatient   Does not apply [phone]  acetaminophen, acetaminophen, HYDROcodone-acetaminophen, ondansetron (ZOFRAN) IV, ondansetron, polyethylene glycol, sodium chloride

## 2014-01-04 NOTE — Progress Notes (Signed)
CRITICAL VALUE ALERT  Critical value received:  Calcium 5.9  Date of notification:  01/04/14   Time of notification:  0641   Critical value read back:Yes.    Nurse who received alert:  Yolanda Bonine, RN  MD notified (1st page):  Tylene Fantasia, NP  Time of first page:  [phone]  Responding MD:  Awaiting response. Reported off to onshift.   Time MD responded:  Awaiting.   Pt alert and responsive to staff; asymptomatic. Pt denies discomfort at this time. Will continue to monitor.

## 2014-01-04 NOTE — Consult Note (Signed)
Laura Hester  [phone]) [phone] Fax:(336) [phone]     ID: Laura Hester DOB: [birth year]  MR#: [id number]  YKD#:[id number]  Patient Care Team: Gara Kroner, MD as PCP - General (Family Medicine) Missy Sabins, MD as Attending Physician (Gastroenterology)  Primary Oncologists: Dr. Jana Hakim, [facility]/ Dr Terrilyn Saver, [facility] Primary BMT: Dr. Charlynn Grimes, [facility] Primary Nephrologist: Dr. Pearson Grippe, [facility] Cardiology: Dr Jenkins Rouge  CHIEF COMPLAINT: IgA lambda multiple myeloma in relapse, amyloidosis, renal failure requiring HD, A Fib  CURRENT TREATMENT: pomalidomide, cyclophosphamide, dexamethasone   MYELOMA HISTORY: 58 y.o. [place] woman presenting with anemia Spring 2013, found to have an IgA >6g, with an M-spike of 4.5 g (and a second M-spike of 0.4g), urine IFE showing IgA-lambda and lambda light chains, creatinine 0.77, calcium 10.0, albumin 3.1, and beta-2-microglobulin 5.51; with bone survey showing multiple myeloma lesions and bone marrow biopsy 09/01/2011 showing 75% myeloma cells. Cytogenetics were normal, FISH suggested a 13q- or loss of chromosome 13, and a 17p-   (1) started on bortezomib weekly 09/22/2011, dexamethasone 40 mg po weekly, lenalidomide 25 mg 14 days on and 7 days off, completed 12/08/2011.   (2) on prophylactic coumadin, discontinued as of 12/13/2011.   (3) zolendronic acid every 28 days, 09/19/2011 to 01/05/2012 (five doses)   (4) s/p neupogen mobilization October 2013, with a collection of 4.26 x10^6 CD34 positive cells   (5) status post melphalan at 200 mg/M2 on 02/23/2012, followed by autologous stem cell transplant the following day.   (6) on 06/07/2012 started maintenance bortezomib sq at 1.3 mg/M2 Q2w, with bactrim and acyclovir prophylaxis, the plan being to contuinue this for 2 years as per the HOVON trial   (7) zolendronic acid, given monthly, resumed June 2014, last dose  10/10/2913 (discontinued at onset renal failure)  (8) disease progression noted with a rise in the IgA lambda fraction September 2014   (9) treated according to the [facility] S1304 randomized to the high dose arm, receiving carfilzomib on days 1, 2, 8, 9, 15, and 16 of every 28 day cycle. Cycle 1 given at a reduced dose of 20 mg per meter square, with subsequent cycles given at 56 mg per meter square. Regimen started 02/07/2013, discontinued as of 07/04/2013 (day 8 cycle 6) due to evidence of progression.   (10 enrolled in BMS 143, receiving elotuzumab weekly, with dexamethasone 40 mg weekly (taken on any non-elituzumab day) and lenalidomide at 25 mg/day for 21 days of each 28 day cycle, starting 08/01/2013 -- discontinued July 2015 with progression (a) prophylactic ASA 325 mg started with the first cycle  (b) prophylactic valacyclovir and trimethoprim-sulfamethoxazole continued as before  (c) received DTaP, Hep B and inactivated polio (Pediarix), H-flu and pneumococcal vacccines (HIb and PCV13) on 07/14/2013 at [facility]; to be repeated 09/13/2013 and October 2015   (11) 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/2013  (a) renal failure requiring HD, started July 2015 at [facility]  (12) being treated with pomalidomide, cyclophosphamide and dexamethasone with evidence of response  (13) atrial fibrillation  (a) failed multiple cardioversion attempts  (b) goal is rate control  (14) mild-moderate mitral regurgitation by echo 12/29/2013  (15) 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 She has been treated with cefepime and vancomycin and undergone  repeated HD with improvement in volume overload and SOB. Because of multiple drug interactions (see Dr Olin Pia note 01/02/2014: "a rhythm  control course will be further complicated by the inability to use amiodarone ( chemo interaction ) and class III drugs (WHICH WOULD LOWER DFT) 2/2 renal issues. 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 Her pomalidomide has been temporarily interrupted. At this point she is clinically much improved and likely will be discharged home within the week    REVIEW OF SYSTEMS: SOB is much better but she has not ambulated halls yet; has a dry cough which is very intermittent and does not keep her up at night; is thinking of "protecting herself" by wearing a mask at all times; no pain; making sure she has soft BMs regularly; no bleeding or bruising. No family in room  PAST MEDICAL HISTORY: Past Medical History  Diagnosis Date  . Fibrocystic breast disease   . Allergic rhinitis   . HTN (hypertension)     mild/ observation only  . Prediabetes   . SUI (stress urinary incontinence, female)   . Overweight (BMI 25.0-29.9)   . Multiple myeloma, stage 3 08/20/11 dx  . Anemia     resolved  . Cancer   . Arthritis   . Acute renal failure (ARF) 10/24/2013    PAST SURGICAL HISTORY: Past Surgical History  Procedure Laterality Date  . Cholecystectomy  1995  . Tonsillectomy and adenoidectomy    . Carpal tunnel release    . Cardioversion N/A 12/29/2013    Procedure: CARDIOVERSION;  Surgeon: Lelon Perla, MD;  Location: [facility] ENDOSCOPY;  Service: Cardiovascular;  Laterality: N/A;  . Tee without cardioversion N/A 12/29/2013    Procedure: TRANSESOPHAGEAL ECHOCARDIOGRAM (TEE);  Surgeon: Lelon Perla, MD;  Location: [facility] ENDOSCOPY;  Service: Cardiovascular;  Laterality: N/A;    FAMILY HISTORY Family History  Problem Relation Age of Onset  . Heart disease Father   . Breast cancer Mother   . Heart disease Mother   . Diabetes Paternal Grandfather   . Hypertension Maternal Grandmother   . Hypertension Brother     SOCIAL HISTORY:  Husband [name]  very involved in patient's care but also travels quite a bit through his work.     ADVANCED DIRECTIVES: discussed at length 01/04/2014; she does not have a formal document; husband [name] is HCPOA; she has already written her funeral and has her burial place picked out. However she remains very positive and "wants resuscitation." We discussed the details of resuscitation and after that discussion it became clear she would NOT want CPR, and would only want intubation/life support if there was a reversible cause (such as the current PNA for instance) and it was expected she would come off the ventilator at some point-- she would not want mechanical support simply to prolong the dying process. She would want everything else, including cardioversion etc as needed. I will write the appropriate orders to conform to this plan   HEALTH MAINTENANCE: History  Substance Use Topics  . Smoking status: Never Smoker   . Smokeless tobacco: Never Used  . Alcohol Use: No     Allergies  Allergen Reactions  . Wellbutrin [Bupropion Hcl] Hives    Current Facility-Administered Medications  Medication Dose Route Frequency Provider Last Rate Last Dose  . acetaminophen (TYLENOL) tablet 650 mg  650 mg Oral Q6H PRN Belkys A Regalado, MD   650 mg at 01/03/14 0519   Or  .  acetaminophen (TYLENOL) suppository 650 mg  650 mg Rectal Q6H PRN Belkys A Regalado, MD      . atenolol (TENORMIN) tablet 25 mg  25 mg Oral BID Deboraha Sprang, MD   25 mg at 01/03/14 2304  . calcium carbonate (TUMS - dosed in mg elemental calcium) chewable tablet 400 mg of elemental calcium  2 tablet Oral TID BM Sol Blazing, MD      . calcium gluconate 2 g in sodium chloride 0.9 % 100 mL IVPB  2 g Intravenous Once Sol Blazing, MD      . ceFEPIme (MAXIPIME) 2 g in dextrose 5 % 50 mL IVPB  2 g Intravenous Q M,W,F-HD Belkys A Regalado, MD   2 g at 01/02/14 1626  . docusate sodium (COLACE) capsule 100 mg  100 mg Oral BID Gardiner Barefoot, NP    100 mg at 01/03/14 2303  . doxercalciferol (HECTOROL) injection 3 mcg  3 mcg Intravenous Q M,W,F-HD Sol Blazing, MD      . feeding supplement (NEPRO CARB STEADY) liquid 237 mL  237 mL Oral BID BM Rogue Bussing, RD   237 mL at 01/03/14 1757  . HYDROcodone-acetaminophen (NORCO/VICODIN) 5-325 MG per tablet 1-2 tablet  1-2 tablet Oral Q4H PRN Belkys A Regalado, MD      . ipratropium-albuterol (DUONEB) 0.5-2.5 (3) MG/3ML nebulizer solution 3 mL  3 mL Nebulization TID Allie Bossier, MD      . midodrine (PROAMATINE) tablet 10 mg  10 mg Oral Q M,W,F-HD Sol Blazing, MD      . ondansetron [facility]) tablet 4 mg  4 mg Oral Q6H PRN Belkys A Regalado, MD       Or  . ondansetron (ZOFRAN) injection 4 mg  4 mg Intravenous Q6H PRN Belkys A Regalado, MD      . polyethylene glycol (MIRALAX / GLYCOLAX) packet 17 g  17 g Oral Daily PRN Gardiner Barefoot, NP   17 g at 01/02/14 2230  . sodium chloride 0.9 % injection 10-40 mL  10-40 mL Intracatheter PRN Allie Bossier, MD   10 mL at 01/04/14 0458  . valACYclovir (VALTREX) tablet 500 mg  500 mg Oral QODAY Belkys A Regalado, MD   500 mg at 01/03/14 1012  . vancomycin (VANCOCIN) IVPB 750 mg/150 ml premix  750 mg Intravenous Q M,W,F-HD Belkys A Regalado, MD   750 mg at 01/02/14 1528  . Warfarin - Pharmacist Dosing Inpatient   Does not apply q1800 Manley Mason, [facility]       Facility-Administered Medications Ordered in Other Encounters  Medication Dose Route Frequency Provider Last Rate Last Dose  . sodium chloride 0.9 % injection 10 mL  10 mL Intracatheter PRN Amy G Berry, PA-C   10 mL at 05/30/13 1640  . sodium chloride 0.9 % injection 10 mL  10 mL Intravenous PRN Chauncey Cruel, MD   10 mL at 08/08/13 1518    OBJECTIVE: middle aged White woman examined in bed Filed Vitals:   01/03/14 2100  BP: 117/50  Pulse: 94  Temp: 99.4 F (37.4 C)  Resp: 20     Body mass index is 29.79 kg/(m^2).    ECOG FS:3 - Symptomatic, >50% confined to  bed  Ocular: Sclerae unicteric, pupils round and equal Ear-nose-throat: Oropharynx clear, dentition in goor repair Lymphatic: No cervical or supraclavicular adenopathy Lungs no rales or rhonchi, fair excursion bilaterally Heart well controlled rate today, do not hear murmur  Abd soft, nontender, positive bowel sounds MSK no focal spinal tenderness Neuro: non-focal, well-oriented x3, appropriate affect Breasts: deferred   LAB RESULTS:  CMP     Component Value Date/Time   NA 133* 01/04/2014 0452   NA 139 12/27/2013 0858   K 3.8 01/04/2014 0452   K 4.1 12/27/2013 0858   CL 94* 01/04/2014 0452   CL 107 09/27/2012 0842   CO2 24 01/04/2014 0452   CO2 28 12/27/2013 0858   GLUCOSE 103* 01/04/2014 0452   GLUCOSE 132 12/27/2013 0858   GLUCOSE 91 09/27/2012 0842   BUN 54* 01/04/2014 0452   BUN 25.1 12/27/2013 0858   CREATININE 4.52* 01/04/2014 0452   CREATININE 4.1* 12/27/2013 0858   CALCIUM 5.9* 01/04/2014 0452   CALCIUM 7.2* 12/27/2013 0858   PROT 4.3* 01/04/2014 0452   PROT 5.1* 12/27/2013 0858   ALBUMIN 1.9* 01/04/2014 0452   ALBUMIN 2.6* 12/27/2013 0858   AST 51* 01/04/2014 0452   AST 17 12/27/2013 0858   ALT 208* 01/04/2014 0452   ALT 19 12/27/2013 0858   ALKPHOS 62 01/04/2014 0452   ALKPHOS 80 12/27/2013 0858   BILITOT 0.7 01/04/2014 0452   BILITOT 0.58 12/27/2013 0858   GFRNONAA 10* 01/04/2014 0452   GFRAA 11* 01/04/2014 0452    I No results found for this basename: SPEP, UPEP,  kappa and lambda light chains    Lab Results  Component Value Date   WBC 2.0* 01/03/2014   NEUTROABS 1.3* 01/03/2014   HGB 9.7* 01/03/2014   HCT 28.4* 01/03/2014   MCV 88.8 01/03/2014   PLT 64* 01/03/2014    @LASTCHEMISTRY @  No results found for this basename: LABCA2    No components found with this basename: LABCA125     Recent Labs Lab 01/04/14 0452  INR 2.28*    Urinalysis    Component Value Date/Time   COLORURINE AMBER* 01/03/2014 0646   APPEARANCEUR CLOUDY* 01/03/2014 0646   LABSPEC 1.036*  01/03/2014 0646   PHURINE 7.5 01/03/2014 0646   GLUCOSEU NEGATIVE 01/03/2014 0646   HGBUR MODERATE* 01/03/2014 0646   BILIRUBINUR SMALL* 01/03/2014 0646   KETONESUR NEGATIVE 01/03/2014 0646   PROTEINUR >300* 01/03/2014 0646   UROBILINOGEN 0.2 01/03/2014 0646   NITRITE NEGATIVE 01/03/2014 0646   LEUKOCYTESUR NEGATIVE 01/03/2014 0646    STUDIES: Dg Chest 2 View  01/03/2014   CLINICAL DATA:  Pneumonia.  Shortness of breath.  EXAM: CHEST  2 VIEW  COMPARISON:  01/01/2014 and 07/08/2013  FINDINGS: Central catheter is are unchanged with the tips in the right atrium. There is persistent area of density in the left upper lung zone. There are increased faint nodular densities throughout the right lung. Compression fractures of mid thoracic spine are unchanged since 07/08/2013.  Tiny left effusion, diminished. Heart size and pulmonary vascularity are.  IMPRESSION: Progressive nodular infiltrates. Persistent density in the left upper lobe is probably pneumonia but the possibility of a mass should be considered. Atypical infection should be considered.   Electronically Signed   By: Rozetta Nunnery M.D.   On: 01/03/2014 08:01   Ct Chest Wo Contrast  01/03/2014   CLINICAL DATA:  Fevers, multiple myeloma, acute renal failure  EXAM: CT CHEST WITHOUT CONTRAST  TECHNIQUE: Multidetector CT imaging of the chest was performed following the standard protocol without IV contrast.  COMPARISON:  None.  FINDINGS: The central airways are patent. There is patchy airspace disease in the right upper lobe, right middle lobe, right lower lobe, left lower lobe and left  upper lobe. There is dense consolidation in the left upper lobe. There are bilateral small pleural effusions. There is no pneumothorax.  There are no pathologically enlarged axillary, hilar or mediastinal lymph nodes.  The heart size is mildly enlarged. There is no pericardial effusion. The thoracic aorta is normal in caliber. Right-sided Port-A-Cath in satisfactory position.  Dual-lumen left-sided central venous catheter in satisfactory position.  Review of bone windows demonstrates small lucent lesions within the sternum and thoracic spine vertebral bodies consistent with patient's known history of multiple myeloma.  Limited non-contrast images of the upper abdomen were obtained. The adrenal glands appear normal. The remainder of the visualized abdominal organs are unremarkable.  IMPRESSION: 1. Multilobar pneumonia with dense consolidation in the left upper lobe. Atypical infection including fungal infection is in the differential diagnosis. 2. Small lucent lesions within the sternum and thoracic spine vertebral bodies consistent with patient's known history of multiple myeloma.   Electronically Signed   By: Kathreen Devoid   On: 01/03/2014 18:35   Dg Chest Port 1 View  01/01/2014   CLINICAL DATA:  Air on 12/29/2013 for cardiac ablation was unsuccessful. Since patient has been having fatigue.  EXAM: PORTABLE CHEST - 1 VIEW  COMPARISON:  09/15/2011.  07/08/2013.  FINDINGS: There is left upper lobe consolidation with milder opacity in the left lower lobe at the lung base partly silhouetting the hemidiaphragm. There is vascular prominence bilaterally, but no other areas of consolidation. Small left effusion is suspected. No pneumothorax.  Right anterior chest wall power Port-A-Cath has its tip at the caval atrial junction. Left sided dual-lumen central venous catheter has its distal tip in the right atrium.  Cardiac silhouette is normal in size. No mediastinal or convincing hilar masses.  Few subtle lucent lesions are noted in the proximal left humerus. These are stable. Patient reportedly has multiple myeloma based on a prior bone survey. Skeletal structures are diffusely demineralized.  IMPRESSION: 1. Left upper lobe consolidation consistent with pneumonia. Milder opacity at the left lung base which may reflect additional pneumonia or be due to atelectasis, likely with a small associated  pleural effusion. 2. No other acute findings.   Electronically Signed   By: Lajean Manes M.D.   On: 01/01/2014 14:33    ASSESSMENT: 58 y.o. 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 We are also holding the cytoxan and dexamethasone (due today) and resuming them next weds., 01/11/2014. This of course assumes her heart rate remains well-controlled and she continues to improve as far as her PNA is concerned.   She will depend on HD to keep her "dry." So long as her INR is therapeutic, aranesp or epo can be used as needed. It is also OK for her to receive neupogen 300 mcg TIW as needed to keep her [place] >1.0 so she can continue her pomalidomide.  She has lab work through our office every Tuesday, next visit at [facility] scheduled for 01/31/2014.  Patient does not desire CPR in case of a terminal event, or life support/ mechanical ventilation if it would only serve to prolong the dying  process. She would want intubation if needed for a reversible problem with a fair chance of her coming off life support, and she would want all other resuscitative interventions including cardioversion as indicated  Ambrosia has a good understanding of the overall plan. She agrees with it. She knows the goal of treatment in her case is control. She will call with any problems that may develop before her next visit in our office.  Chauncey Cruel, MD   01/04/2014 7:24 AM

## 2014-01-04 NOTE — Consult Note (Signed)
[facility] [place] [facility]  Date of Admission:  01/01/2014  Date of Consult:  01/04/2014  Reason for Consult: Health Care Acquired Pneumonia Referring Physician: Eliseo Squires  Impression/Recommendation HCAP, ? Fungal Multiple Myeloma Transaminitis  Would: broaden cefepime to imipenem Check fungal serologies Check CMV PCR Hold adding fungal coverage/micafungin- she appears to be improving Repeat CXR 2-3 days Consider BAL if not improving?  Comment- Fungal pneumonia is a real consideration in her however she is improving on routine antibacterials. Would consider BAL if her repeat films show worsening or she decompensates.   Thank you so much for this interesting consult,   Bobby Rumpf (pager) [phone] [email or web address]  Laura Hester is an 58 y.o. female.  HPI: 58 yo F with multiple myeloma (BMTxp (02-2012), on decardon 4gm, pomalidomide, cyclophosphamide weekly), ESRD, aq fib (attempted cardioversion 9-17). She was adm on 9-20 with worsening SOB, cough and fever.  She had temp 101, WBC 2.0, PLT 87, and CXR showing LUL consolidation in ED. She was started on vancomycin/cefepime. In hospital, her temp increased to 102.1 but has since defervesced. She states she has ambulated today. Her breathing is better. She has been eating.   Past Medical History  Diagnosis Date  . Fibrocystic breast disease   . Allergic rhinitis   . HTN (hypertension)     mild/ observation only  . Prediabetes   . SUI (stress urinary incontinence, female)   . Overweight (BMI 25.0-29.9)   . Multiple myeloma, stage 3 08/20/11 dx  . Anemia     resolved  . Cancer   . Arthritis   . Acute renal failure (ARF) 10/24/2013    Past Surgical History  Procedure Laterality Date  . Cholecystectomy  1995  . Tonsillectomy and adenoidectomy    . Carpal tunnel release    . Cardioversion N/A 12/29/2013    Procedure: CARDIOVERSION;  Surgeon: Lelon Perla, MD;  Location: [facility] ENDOSCOPY;   Service: Cardiovascular;  Laterality: N/A;  . Tee without cardioversion N/A 12/29/2013    Procedure: TRANSESOPHAGEAL ECHOCARDIOGRAM (TEE);  Surgeon: Lelon Perla, MD;  Location: [facility] ENDOSCOPY;  Service: Cardiovascular;  Laterality: N/A;     Allergies  Allergen Reactions  . Wellbutrin [Bupropion Hcl] Hives    Medications:  Scheduled: . atenolol  25 mg Oral BID  . calcium carbonate  2 tablet Oral TID BM  . ceFEPime (MAXIPIME) IV  2 g Intravenous Q M,W,F-HD  . docusate sodium  100 mg Oral BID  . doxercalciferol      . doxercalciferol  3 mcg Intravenous Q M,W,F-HD  . feeding supplement (NEPRO CARB STEADY)  237 mL Oral BID BM  . ipratropium-albuterol  3 mL Nebulization TID  . midodrine  10 mg Oral Q M,W,F-HD  . valACYclovir  500 mg Oral QODAY  . vancomycin  750 mg Intravenous Q M,W,F-HD  . warfarin  2.5 mg Oral ONCE-1800  . Warfarin - Pharmacist Dosing Inpatient   Does not apply q1800    Abtx:  Anti-infectives   Start     Dose/Rate Route Frequency Ordered Stop   01/02/14 1200  vancomycin (VANCOCIN) IVPB 750 mg/150 ml premix     750 mg 150 mL/hr over 60 Minutes Intravenous Every M-W-F (Hemodialysis) 01/02/14 0053     01/02/14 1200  ceFEPIme (MAXIPIME) 2 g in dextrose 5 % 50 mL IVPB     2 g 100 mL/hr over 30 Minutes Intravenous Every M-W-F (Hemodialysis) 01/02/14 0053     01/01/14 2330  vancomycin (VANCOCIN) IVPB  750 mg/150 ml premix     750 mg 150 mL/hr over 60 Minutes Intravenous  Once 01/01/14 2138 01/02/14 0016   01/01/14 2230  ceFEPIme (MAXIPIME) 1 g in dextrose 5 % 50 mL IVPB     1 g 100 mL/hr over 30 Minutes Intravenous  Once 01/01/14 2138 01/01/14 2346   01/01/14 1830  valACYclovir (VALTREX) tablet 500 mg     500 mg Oral Every other day 01/01/14 1824     01/01/14 1630  vancomycin (VANCOCIN) 1,500 mg in sodium chloride 0.9 % 500 mL IVPB     1,500 mg 250 mL/hr over 120 Minutes Intravenous  Once 01/01/14 1626 01/01/14 1928   01/01/14 1500  ceFEPIme (MAXIPIME) 1 g in  dextrose 5 % 50 mL IVPB     1 g 100 mL/hr over 30 Minutes Intravenous  Once 01/01/14 1458 01/01/14 1600      Total days of antibiotics: 4 (vanco/cefepime)          Social History:  reports that she has never smoked. She has never used smokeless tobacco. She reports that she does not drink alcohol or use illicit drugs.  Family History  Problem Relation Age of Onset  . Heart disease Father   . Breast cancer Mother   . Heart disease Mother   . Diabetes Paternal Grandfather   . Hypertension Maternal Grandmother   . Hypertension Brother     General ROS: no problems with port, hd line. see HPI.   Blood pressure 103/84, pulse 82, temperature 98.9 F (37.2 C), temperature source Oral, resp. rate 24, height 5' 2"  (1.575 m), weight 73.9 kg (162 lb 14.7 oz), last menstrual period 04/20/2011, SpO2 95.00%. General appearance: alert, cooperative and no distress Eyes: negative findings: conjunctivae and sclerae normal and pupils equal, round, reactive to light and accomodation Throat: lips, mucosa, and tongue normal; teeth and gums normal Neck: no adenopathy and supple, symmetrical, trachea midline Lungs: diminished breath sounds bilaterally Heart: regular rate and rhythm Abdomen: normal findings: bowel sounds normal and soft, non-tender Extremities: edema > 3+ BLE Skin: R chest port is clean, non-tender. L chest HD line is clean, non-tender.    Results for orders placed during the hospital encounter of 01/01/14 (from the past 48 hour(s))  LACTATE DEHYDROGENASE     Status: Abnormal   Collection Time    01/03/14  4:40 AM      Result Value Ref Range   LDH 370 (*) 94 - 250 U/L   Comment: HEMOLYSIS AT THIS LEVEL MAY AFFECT RESULT  MAGNESIUM     Status: None   Collection Time    01/03/14  4:40 AM      Result Value Ref Range   Magnesium 1.8  1.5 - 2.5 mg/dL  PHOSPHORUS     Status: None   Collection Time    01/03/14  4:40 AM      Result Value Ref Range   Phosphorus 2.4  2.3 - 4.6 mg/dL    CBC WITH DIFFERENTIAL     Status: Abnormal   Collection Time    01/03/14  5:15 AM      Result Value Ref Range   WBC 9.2  4.0 - 10.5 K/uL   Comment: PATIENT IDENTIFICATION ERROR. PLEASE DISREGARD RESULTS. ACCOUNT WILL BE CREDITED.   RBC 4.44  3.87 - 5.11 MIL/uL   Comment: PATIENT IDENTIFICATION ERROR. PLEASE DISREGARD RESULTS. ACCOUNT WILL BE CREDITED.   Hemoglobin 10.3 (*) 12.0 - 15.0 g/dL   Comment: POST TRANSFUSION SPECIMEN  REPEATED TO VERIFY     PATIENT IDENTIFICATION ERROR. PLEASE DISREGARD RESULTS. ACCOUNT WILL BE CREDITED.   HCT 38.3  36.0 - 46.0 %   Comment: PATIENT IDENTIFICATION ERROR. PLEASE DISREGARD RESULTS. ACCOUNT WILL BE CREDITED.   MCV 86.3  78.0 - 100.0 fL   Comment: POST TRANSFUSION SPECIMEN     REPEATED TO VERIFY     PATIENT IDENTIFICATION ERROR. PLEASE DISREGARD RESULTS. ACCOUNT WILL BE CREDITED.   MCH 23.2 (*) 26.0 - 34.0 pg   Comment: PATIENT IDENTIFICATION ERROR. PLEASE DISREGARD RESULTS. ACCOUNT WILL BE CREDITED.   MCHC 26.9 (*) 30.0 - 36.0 g/dL   Comment: PATIENT IDENTIFICATION ERROR. PLEASE DISREGARD RESULTS. ACCOUNT WILL BE CREDITED.   RDW 21.3 (*) 11.5 - 15.5 %   Comment: PATIENT IDENTIFICATION ERROR. PLEASE DISREGARD RESULTS. ACCOUNT WILL BE CREDITED.   Platelets 253  150 - 400 K/uL   Comment: POST TRANSFUSION SPECIMEN     REPEATED TO VERIFY     PATIENT IDENTIFICATION ERROR. PLEASE DISREGARD RESULTS. ACCOUNT WILL BE CREDITED.  PROTIME-INR     Status: Abnormal   Collection Time    01/03/14  5:15 AM      Result Value Ref Range   Prothrombin Time 21.6 (*) 11.6 - 15.2 seconds   Comment: PATIENT IDENTIFICATION ERROR. PLEASE DISREGARD RESULTS. ACCOUNT WILL BE CREDITED.   INR 1.88 (*) 0.00 - 1.49   Comment: PATIENT IDENTIFICATION ERROR. PLEASE DISREGARD RESULTS. ACCOUNT WILL BE CREDITED.  URINALYSIS, ROUTINE W REFLEX MICROSCOPIC     Status: Abnormal   Collection Time    01/03/14  6:46 AM      Result Value Ref Range   Color, Urine AMBER (*) YELLOW    Comment: BIOCHEMICALS MAY BE AFFECTED BY COLOR   APPearance CLOUDY (*) CLEAR   Specific Gravity, Urine 1.036 (*) 1.005 - 1.030   pH 7.5  5.0 - 8.0   Glucose, UA NEGATIVE  NEGATIVE mg/dL   Hgb urine dipstick MODERATE (*) NEGATIVE   Bilirubin Urine SMALL (*) NEGATIVE   Ketones, ur NEGATIVE  NEGATIVE mg/dL   Protein, ur >300 (*) NEGATIVE mg/dL   Urobilinogen, UA 0.2  0.0 - 1.0 mg/dL   Nitrite NEGATIVE  NEGATIVE   Leukocytes, UA NEGATIVE  NEGATIVE  URINE CULTURE     Status: None   Collection Time    01/03/14  6:46 AM      Result Value Ref Range   Specimen Description URINE, CLEAN CATCH     Special Requests NONE     Culture  Setup Time       Value: 01/03/2014 12:42     Performed at [facility]       Value: >=100,000 COLONIES/ML     Performed at [facility]   Culture       Value: DIPHTHEROIDS(CORYNEBACTERIUM SPECIES)     Note: Standardized susceptibility testing for this organism is not available.     Performed at [facility]   Report Status 01/04/2014 FINAL    URINE MICROSCOPIC-ADD ON     Status: Abnormal   Collection Time    01/03/14  6:46 AM      Result Value Ref Range   Squamous Epithelial / LPF MANY (*) RARE   WBC, UA 3-6  <3 WBC/hpf   RBC / HPF 21-50  <3 RBC/hpf   Bacteria, UA FEW (*) RARE   Casts HYALINE CASTS (*) NEGATIVE   Comment: GRANULAR CAST     WAXY CAST  CBC WITH DIFFERENTIAL     Status: Abnormal   Collection Time    01/03/14 10:37 AM      Result Value Ref Range   WBC 2.0 (*) 4.0 - 10.5 K/uL   RBC 3.20 (*) 3.87 - 5.11 MIL/uL   Hemoglobin 9.7 (*) 12.0 - 15.0 g/dL   Comment: POST TRANSFUSION SPECIMEN   HCT 28.4 (*) 36.0 - 46.0 %   MCV 88.8  78.0 - 100.0 fL   MCH 30.3  26.0 - 34.0 pg   MCHC 34.2  30.0 - 36.0 g/dL   RDW 19.3 (*) 11.5 - 15.5 %   Platelets 64 (*) 150 - 400 K/uL   Comment: SPECIMEN CHECKED FOR CLOTS     REPEATED TO VERIFY     PLATELET COUNT CONFIRMED BY SMEAR   Neutrophils Relative % 63  43 - 77 %    Lymphocytes Relative 17  12 - 46 %   Monocytes Relative 11  3 - 12 %   Eosinophils Relative 6 (*) 0 - 5 %   Basophils Relative 3 (*) 0 - 1 %   Neutro Abs 1.3 (*) 1.7 - 7.7 K/uL   Lymphs Abs 0.3 (*) 0.7 - 4.0 K/uL   Monocytes Absolute 0.2  0.1 - 1.0 K/uL   Eosinophils Absolute 0.1  0.0 - 0.7 K/uL   Basophils Absolute 0.1  0.0 - 0.1 K/uL   RBC Morphology POLYCHROMASIA PRESENT     WBC Morphology VACUOLATED NEUTROPHILS     Comment: DOHLE BODIES     MILD LEFT SHIFT (1-5% METAS, OCC MYELO, OCC BANDS)  COMPREHENSIVE METABOLIC PANEL     Status: Abnormal   Collection Time    01/03/14 10:37 AM      Result Value Ref Range   Sodium 138  137 - 147 mEq/L   Potassium 3.7  3.7 - 5.3 mEq/L   Comment: DELTA CHECK NOTED   Chloride 98  96 - 112 mEq/L   CO2 28  19 - 32 mEq/L   Glucose, Bld 123 (*) 70 - 99 mg/dL   BUN 38 (*) 6 - 23 mg/dL   Creatinine, Ser 3.34 (*) 0.50 - 1.10 mg/dL   Calcium 6.5 (*) 8.4 - 10.5 mg/dL   Total Protein 4.6 (*) 6.0 - 8.3 g/dL   Albumin 2.1 (*) 3.5 - 5.2 g/dL   AST 51 (*) 0 - 37 U/L   ALT 297 (*) 0 - 35 U/L   Alkaline Phosphatase 71  39 - 117 U/L   Total Bilirubin 1.0  0.3 - 1.2 mg/dL   GFR calc non Af Amer 14 (*) >90 mL/min   GFR calc Af Amer 17 (*) >90 mL/min   Comment: (NOTE)     The eGFR has been calculated using the CKD EPI equation.     This calculation has not been validated in all clinical situations.     eGFR's persistently <90 mL/min signify possible Chronic Kidney     Disease.   Anion gap 12  5 - 15  PROTIME-INR     Status: Abnormal   Collection Time    01/04/14  4:52 AM      Result Value Ref Range   Prothrombin Time 25.1 (*) 11.6 - 15.2 seconds   INR 2.28 (*) 0.00 - 1.49  COMPREHENSIVE METABOLIC PANEL     Status: Abnormal   Collection Time    01/04/14  4:52 AM      Result Value Ref Range   Sodium 133 (*)  137 - 147 mEq/L   Potassium 3.8  3.7 - 5.3 mEq/L   Chloride 94 (*) 96 - 112 mEq/L   CO2 24  19 - 32 mEq/L   Glucose, Bld 103 (*) 70 - 99 mg/dL     BUN 54 (*) 6 - 23 mg/dL   Creatinine, Ser 4.52 (*) 0.50 - 1.10 mg/dL   Calcium 5.9 (*) 8.4 - 10.5 mg/dL   Comment: CRITICAL RESULT CALLED TO, READ BACK BY AND VERIFIED WITH:     L. [facility] RN [id number] [phone] GREEN R   Total Protein 4.3 (*) 6.0 - 8.3 g/dL   Albumin 1.9 (*) 3.5 - 5.2 g/dL   AST 51 (*) 0 - 37 U/L   ALT 208 (*) 0 - 35 U/L   Alkaline Phosphatase 62  39 - 117 U/L   Total Bilirubin 0.7  0.3 - 1.2 mg/dL   GFR calc non Af Amer 10 (*) >90 mL/min   GFR calc Af Amer 11 (*) >90 mL/min   Comment: (NOTE)     The eGFR has been calculated using the CKD EPI equation.     This calculation has not been validated in all clinical situations.     eGFR's persistently <90 mL/min signify possible Chronic Kidney     Disease.   Anion gap 15  5 - 15  CBC     Status: Abnormal (Preliminary result)   Collection Time    01/04/14  3:22 PM      Result Value Ref Range   WBC 2.8 (*) 4.0 - 10.5 K/uL   RBC 3.18 (*) 3.87 - 5.11 MIL/uL   Hemoglobin 9.7 (*) 12.0 - 15.0 g/dL   HCT 28.0 (*) 36.0 - 46.0 %   MCV 88.1  78.0 - 100.0 fL   MCH 30.5  26.0 - 34.0 pg   MCHC 34.6  30.0 - 36.0 g/dL   RDW 19.0 (*) 11.5 - 15.5 %   Platelets PENDING  150 - 400 K/uL      Component Value Date/Time   SDES URINE, CLEAN CATCH 01/03/2014 0646   SPECREQUEST NONE 01/03/2014 0646   CULT  Value: DIPHTHEROIDS(CORYNEBACTERIUM SPECIES) Note: Standardized susceptibility testing for this organism is not available. Performed at [facility] 01/03/2014 1700   REPTSTATUS 01/04/2014 FINAL 01/03/2014 0646   Dg Chest 2 View  01/03/2014   CLINICAL DATA:  Pneumonia.  Shortness of breath.  EXAM: CHEST  2 VIEW  COMPARISON:  01/01/2014 and 07/08/2013  FINDINGS: Central catheter is are unchanged with the tips in the right atrium. There is persistent area of density in the left upper lung zone. There are increased faint nodular densities throughout the right lung. Compression fractures of mid thoracic spine are unchanged since 07/08/2013.   Tiny left effusion, diminished. Heart size and pulmonary vascularity are.  IMPRESSION: Progressive nodular infiltrates. Persistent density in the left upper lobe is probably pneumonia but the possibility of a mass should be considered. Atypical infection should be considered.   Electronically Signed   By: Rozetta Nunnery M.D.   On: 01/03/2014 08:01   Ct Chest Wo Contrast  01/03/2014   CLINICAL DATA:  Fevers, multiple myeloma, acute renal failure  EXAM: CT CHEST WITHOUT CONTRAST  TECHNIQUE: Multidetector CT imaging of the chest was performed following the standard protocol without IV contrast.  COMPARISON:  None.  FINDINGS: The central airways are patent. There is patchy airspace disease in the right upper lobe, right middle lobe, right lower lobe, left lower lobe  and left upper lobe. There is dense consolidation in the left upper lobe. There are bilateral small pleural effusions. There is no pneumothorax.  There are no pathologically enlarged axillary, hilar or mediastinal lymph nodes.  The heart size is mildly enlarged. There is no pericardial effusion. The thoracic aorta is normal in caliber. Right-sided Port-A-Cath in satisfactory position. Dual-lumen left-sided central venous catheter in satisfactory position.  Review of bone windows demonstrates small lucent lesions within the sternum and thoracic spine vertebral bodies consistent with patient's known history of multiple myeloma.  Limited non-contrast images of the upper abdomen were obtained. The adrenal glands appear normal. The remainder of the visualized abdominal organs are unremarkable.  IMPRESSION: 1. Multilobar pneumonia with dense consolidation in the left upper lobe. Atypical infection including fungal infection is in the differential diagnosis. 2. Small lucent lesions within the sternum and thoracic spine vertebral bodies consistent with patient's known history of multiple myeloma.   Electronically Signed   By: Kathreen Devoid   On: 01/03/2014 18:35    Recent Results (from the past 240 hour(s))  CULTURE, BLOOD (ROUTINE X 2)     Status: None   Collection Time    01/01/14  1:53 PM      Result Value Ref Range Status   Specimen Description BLOOD RIGHT HAND   Final   Special Requests BOTTLES DRAWN AEROBIC AND ANAEROBIC 5 CC   Final   Culture  Setup Time     Final   Value: 01/01/2014 22:00     Performed at [facility]   Culture     Final   Value:        BLOOD CULTURE RECEIVED NO GROWTH TO DATE CULTURE WILL BE HELD FOR 5 DAYS BEFORE ISSUING A FINAL NEGATIVE REPORT     Performed at [facility]   Report Status PENDING   Incomplete  CULTURE, BLOOD (ROUTINE X 2)     Status: None   Collection Time    01/01/14  2:02 PM      Result Value Ref Range Status   Specimen Description BLOOD PORTA CATH   Final   Special Requests BOTTLES DRAWN AEROBIC AND ANAEROBIC 10 CC   Final   Culture  Setup Time     Final   Value: 01/01/2014 21:58     Performed at [facility]   Culture     Final   Value:        BLOOD CULTURE RECEIVED NO GROWTH TO DATE CULTURE WILL BE HELD FOR 5 DAYS BEFORE ISSUING A FINAL NEGATIVE REPORT     Note: Culture results may be compromised due to an excessive volume of blood received in culture bottles.     Performed at [facility]   Report Status PENDING   Incomplete  MRSA PCR SCREENING     Status: None   Collection Time    01/01/14  6:31 PM      Result Value Ref Range Status   MRSA by PCR NEGATIVE  NEGATIVE Final   Comment:            The GeneXpert MRSA Assay (FDA     approved for NASAL specimens     only), is one component of a     comprehensive MRSA colonization     surveillance program. It is not     intended to diagnose MRSA     infection nor to guide or     monitor treatment for  MRSA infections.  URINE CULTURE     Status: None   Collection Time    01/03/14  6:46 AM      Result Value Ref Range Status   Specimen Description URINE, CLEAN CATCH   Final   Special Requests NONE    Final   Culture  Setup Time     Final   Value: 01/03/2014 12:42     Performed at [place]     Final   Value: >=100,000 COLONIES/ML     Performed at [facility]   Culture     Final   Value: DIPHTHEROIDS(CORYNEBACTERIUM SPECIES)     Note: Standardized susceptibility testing for this organism is not available.     Performed at [facility]   Report Status 01/04/2014 FINAL   Final      01/04/2014, 4:00 PM     LOS: 3 days

## 2014-01-05 LAB — BASIC METABOLIC PANEL
ANION GAP: 10 (ref 5–15)
BUN: 44 mg/dL — ABNORMAL HIGH (ref 6–23)
CO2: 28 meq/L (ref 19–32)
CREATININE: 3.64 mg/dL — AB (ref 0.50–1.10)
Calcium: 6.5 mg/dL — ABNORMAL LOW (ref 8.4–10.5)
Chloride: 99 mEq/L (ref 96–112)
GFR calc Af Amer: 15 mL/min — ABNORMAL LOW (ref >90)
GFR calc non Af Amer: 13 mL/min — ABNORMAL LOW (ref >90)
Glucose, Bld: 117 mg/dL — ABNORMAL HIGH (ref 70–99)
Potassium: 4.1 mEq/L (ref 3.7–5.3)
SODIUM: 137 meq/L (ref 137–147)

## 2014-01-05 LAB — CBC
HCT: 26.9 % — ABNORMAL LOW (ref 36.0–46.0)
Hemoglobin: 9 g/dL — ABNORMAL LOW (ref 12.0–15.0)
MCH: 30 pg (ref 26.0–34.0)
MCHC: 33.5 g/dL (ref 30.0–36.0)
MCV: 89.7 fL (ref 78.0–100.0)
Platelets: 64 10*3/uL — ABNORMAL LOW (ref 150–400)
RBC: 3 MIL/uL — ABNORMAL LOW (ref 3.87–5.11)
RDW: 19.2 % — ABNORMAL HIGH (ref 11.5–15.5)
WBC: 2.8 10*3/uL — ABNORMAL LOW (ref 4.0–10.5)

## 2014-01-05 LAB — PROTIME-INR
INR: 3.04 — ABNORMAL HIGH (ref 0.00–1.49)
Prothrombin Time: 31.5 seconds — ABNORMAL HIGH (ref 11.6–15.2)

## 2014-01-05 NOTE — Progress Notes (Signed)
Initial visit with Laura Hester. Pt noted that she has had many prayers this morning and was feeling pretty good. Made aware of services and availability. Will page if desired/ needed. No need for support at this time.  Delford Field 01/05/2014 11:18 AM [phone]

## 2014-01-05 NOTE — Progress Notes (Signed)
Subjective:  No cos , tolerated hd yesterday  Objective Vital signs in last 24 hours: Filed Vitals:   01/04/14 1820 01/04/14 2053 01/04/14 2054 01/05/14 0500  BP: 107/55 122/59  103/64  Pulse: 88 90 86 75  Temp: 98.2 F (36.8 C) 100.5 F (38.1 C)  97.8 F (36.6 C)  TempSrc: Oral Oral  Oral  Resp: _0 Height:      Weight: 70 kg (154 lb 5.2 oz)     SpO2: 96% 96%  96%   Weight change:   Physical Exam:  General = Alert,NAD , dry cough/ambulating in room  Lungs= CTA bilat.TDC left chest / no jvd  Card= Irreg irreg S1S2 No S3 1/6 murmur ape   Abdomen: soft, without focal tenderness   Extrem.=2+ pitting bilat LE edema   HD access= L chest perm cath   [facility]: [facility]  3.5h 73kg 3K/2.50 Bath IJ Catheter Heparin 500 (five hundred) units  No ESA (per [facility] oncology)    Problem/Plan: 1 Fever / multifocal PNA - on IV AB, ID consulting / noted in antibiotic change and re ck cxr 3 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 /hd in am  4 IgA lambda LC myeloma - hx of prior SCT 2013, then recurrence; active chemoRx on hold now  5 Hypocalcemia - progressive, asymptomatic, due to CKD and assoc vit D deficiency / use added ca bath on hd/     Phos= 2.4 no binder/ po tums between meals and 29mg hectorol on hd 6 Afib on coumadin - rate control w atenolol  7 MV regurgitation- new finding on TEE, EF preserved, blood cx's negative  8 Hypotension-  103/64 bp better, BP over 100, 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  RKelly SplinterMD pager [phone]   cell [phone]/24/2015, 4:11 PM    Labs: Basic Metabolic Panel:  Recent Labs Lab 01/02/14 0440 01/03/14 0440 01/03/14 1037  01/04/14 0452  NA 138  --  138 133*  K 4.5  --  3.7 3.8  CL 99  --  98 94*  CO2 25  --  28 24  GLUCOSE 92  --  123* 103*  BUN 31*  --  38* 54*  CREATININE 3.27*  --  3.34* 4.52*  CALCIUM 6.7*  --  6.5* 5.9*  PHOS  --  2.4  --   --    Liver Function Tests:  Recent Labs Lab 01/02/14 0440 01/03/14 1037 01/04/14 0452  AST 94* 51* 51*  ALT 493* 297* 208*  ALKPHOS 84 71 62  BILITOT 1.2 1.0 0.7  PROT 4.7* 4.6* 4.3*  ALBUMIN 2.4* 2.1* 1.9*  CBC:  Recent Labs Lab 01/01/14 1356 01/02/14 0440 01/03/14 0515 01/03/14 1037 01/04/14 1522  WBC 2.0* 1.3* 9.2 2.0* 2.8*  NEUTROABS 1.7 1.1*  --  1.3*  --   HGB 7.7* 7.3* 10.3* 9.7* 9.7*  HCT 23.4* 22.2* 38.3 28.4* 28.0*  MCV 91.1 92.9 86.3 88.8 88.1  PLT 87* 71* 253 64* 59*   Cardiac Enzymes:  Recent Labs Lab 01/01/14 1553 01/01/14 2212 01/02/14 0440  TROPONINI <0.30 <0.30 <0.30   CBG:  No results found for this basename: GLUCAP,  in the last 168 hours  Studies/Results: Ct Chest Wo Contrast  01/03/2014   CLINICAL DATA:  Fevers, multiple myeloma, acute renal failure  EXAM: CT CHEST WITHOUT CONTRAST  TECHNIQUE: Multidetector CT imaging of the chest was performed following the standard protocol without IV contrast.  COMPARISON:  None.  FINDINGS: The central airways are patent. There is patchy airspace disease in the right upper lobe, right middle lobe, right lower lobe, left lower lobe and left upper lobe. There is dense consolidation in the left upper lobe. There are bilateral small pleural effusions. There is no pneumothorax.  There are no pathologically enlarged axillary, hilar or mediastinal lymph nodes.  The heart size is mildly enlarged. There is no pericardial effusion. The thoracic aorta is normal in caliber. Right-sided Port-A-Cath in satisfactory position. Dual-lumen left-sided central venous catheter in satisfactory position.  Review of bone windows demonstrates small lucent lesions within the sternum and thoracic spine  vertebral bodies consistent with patient's known history of multiple myeloma.  Limited non-contrast images of the upper abdomen were obtained. The adrenal glands appear normal. The remainder of the visualized abdominal organs are unremarkable.  IMPRESSION: 1. Multilobar pneumonia with dense consolidation in the left upper lobe. Atypical infection including fungal infection is in the differential diagnosis. 2. Small lucent lesions within the sternum and thoracic spine vertebral bodies consistent with patient's known history of multiple myeloma.   Electronically Signed   By: Kathreen Devoid   On: 01/03/2014 18:35   Medications:   . atenolol  25 mg Oral BID  . calcium carbonate  2 tablet Oral TID BM  . docusate sodium  100 mg Oral BID  . doxercalciferol  3 mcg Intravenous Q M,W,F-HD  . feeding supplement (NEPRO CARB STEADY)  237 mL Oral BID BM  . imipenem-cilastatin  250 mg Intravenous Q12H  . ipratropium-albuterol  3 mL Nebulization TID  . midodrine  10 mg Oral Q M,W,F-HD  . valACYclovir  500 mg Oral QODAY  . vancomycin  750 mg Intravenous Q M,W,F-HD  . Warfarin - Pharmacist Dosing Inpatient   Does not apply [phone]

## 2014-01-05 NOTE — Progress Notes (Signed)
PT Cancellation Note  Patient Details Name: Laura Hester MRN: [id number] DOB: 09/23/1955   Cancelled Treatment:    Reason Eval/Treat Not Completed: Other (comment) (Pt declined as she is doing very well, walking alone) Pt has refused further services as she is independently walking on the hall.  Will not expect PT to see her, but invited her to ask MD if she wishes to have PT after refusing services.  Ramond Dial 01/05/2014, 1:29 PM Mee Hives, PT [facility] Acute Rehab Dept. Number: [phone]

## 2014-01-05 NOTE — Progress Notes (Signed)
ANTICOAGULATION & ANTIBIOTIC CONSULT NOTE - Follow Up Consult  Pharmacy Consult for Warfarin Indication: Afib   Allergies  Allergen Reactions  . Wellbutrin [Bupropion Hcl] Hives    Patient Measurements: Height: 5\' 2"  (157.5 cm) Weight: 154 lb 5.2 oz (70 kg) IBW/kg (Calculated) : 50.1  Vital Signs: Temp: 99 F (37.2 C) (09/24 1028) Temp src: Oral (09/24 1028) BP: 117/54 mmHg (09/24 1028) Pulse Rate: 89 (09/24 1028)  Labs:  Recent Labs  01/03/14 0515 01/03/14 1037 01/04/14 0452 01/04/14 1522 01/05/14 0830  HGB 10.3* 9.7*  --  9.7* 9.0*  HCT 38.3 28.4*  --  28.0* 26.9*  PLT 253 64*  --  59* 64*  LABPROT 21.6*  --  25.1*  --  31.5*  INR 1.88*  --  2.28*  --  3.04*  CREATININE  --  3.34* 4.52*  --  3.64*    Estimated Creatinine Clearance: 15.6 ml/min (by C-G formula based on Cr of 3.64).   Assessment: 79 YOF who continues on warfarin for Afib s/p unsuccessfully DCCV on 9/17 with a slightly SUPRAtherapeutic INR this morning despite reducing the dose yesterday (INR 3.04 << 2.28, goal of 2-3). The patient is holding oral chemo meds for MM - which can reduce warfarin sensitivity however is noted to have elevated LFTs which can increase warfarin sensitivity. Will continue dosing cautiously - will hold warfarin dose today due to the large jump in INR  Goal of Therapy:  INR 2-3 Proper antibiotics for infection/cultures adjusted for renal/hepatic function    Plan:  1. Hold warfarin today 2. Will continue to monitor for any signs/symptoms of bleeding and will follow up with PT/INR in the a.m.   Alycia Rossetti, PharmD, BCPS Clinical Pharmacist Pager: [phone] 01/05/2014 1:03 PM

## 2014-01-05 NOTE — Progress Notes (Signed)
INFECTIOUS DISEASE PROGRESS NOTE  ID: FIORELLA HANAHAN is a 58 y.o. female with  Active Problems:   Multiple myeloma   Anemia in neoplastic disease   CKD (chronic kidney disease) stage V requiring chronic dialysis   Atrial fibrillation with RVR   HCAP (healthcare-associated pneumonia)   Neutropenia, unspecified   Transaminitis   Sepsis  Subjective: Feels well, wants to go home in AM.   Abtx:  Anti-infectives   Start     Dose/Rate Route Frequency Ordered Stop   01/04/14 1700  imipenem-cilastatin (PRIMAXIN) 250 mg in sodium chloride 0.9 % 100 mL IVPB     250 mg 200 mL/hr over 30 Minutes Intravenous Every 12 hours 01/04/14 1655     01/02/14 1200  vancomycin (VANCOCIN) IVPB 750 mg/150 ml premix     750 mg 150 mL/hr over 60 Minutes Intravenous Every M-W-F (Hemodialysis) 01/02/14 0053     01/02/14 1200  ceFEPIme (MAXIPIME) 2 g in dextrose 5 % 50 mL IVPB  Status:  Discontinued     2 g 100 mL/hr over 30 Minutes Intravenous Every M-W-F (Hemodialysis) 01/02/14 0053 01/04/14 1635   01/01/14 2330  vancomycin (VANCOCIN) IVPB 750 mg/150 ml premix     750 mg 150 mL/hr over 60 Minutes Intravenous  Once 01/01/14 2138 01/02/14 0016   01/01/14 2230  ceFEPIme (MAXIPIME) 1 g in dextrose 5 % 50 mL IVPB     1 g 100 mL/hr over 30 Minutes Intravenous  Once 01/01/14 2138 01/01/14 2346   01/01/14 1830  valACYclovir (VALTREX) tablet 500 mg     500 mg Oral Every other day 01/01/14 1824     01/01/14 1630  vancomycin (VANCOCIN) 1,500 mg in sodium chloride 0.9 % 500 mL IVPB     1,500 mg 250 mL/hr over 120 Minutes Intravenous  Once 01/01/14 1626 01/01/14 1928   01/01/14 1500  ceFEPIme (MAXIPIME) 1 g in dextrose 5 % 50 mL IVPB     1 g 100 mL/hr over 30 Minutes Intravenous  Once 01/01/14 1458 01/01/14 1600      Medications:  Scheduled: . atenolol  25 mg Oral BID  . calcium carbonate  2 tablet Oral TID BM  . docusate sodium  100 mg Oral BID  . doxercalciferol  3 mcg Intravenous Q M,W,F-HD  .  feeding supplement (NEPRO CARB STEADY)  237 mL Oral BID BM  . imipenem-cilastatin  250 mg Intravenous Q12H  . ipratropium-albuterol  3 mL Nebulization TID  . midodrine  10 mg Oral Q M,W,F-HD  . valACYclovir  500 mg Oral QODAY  . vancomycin  750 mg Intravenous Q M,W,F-HD  . Warfarin - Pharmacist Dosing Inpatient   Does not apply q1800    Objective: Vital signs in last 24 hours: Temp:  [97.8 F (36.6 C)-100.5 F (38.1 C)] 98.2 F (36.8 C) (09/24 1806) Pulse Rate:  [75-90] 82 (09/24 1806) Resp:  [16-18] 18 (09/24 1806) BP: (103-122)/(52-64) 114/52 mmHg (09/24 1806) SpO2:  [96 %-97 %] 97 % (09/24 1806)   General appearance: alert, cooperative and no distress Resp: diminished breath sounds RLL Cardio: regular rate and rhythm GI: normal findings: bowel sounds normal and soft, non-tender  Lab Results  Recent Labs  01/04/14 0452 01/04/14 1522 01/05/14 0830  WBC  --  2.8* 2.8*  HGB  --  9.7* 9.0*  HCT  --  28.0* 26.9*  NA 133*  --  137  K 3.8  --  4.1  CL 94*  --  99  CO2 24  --  28  BUN 54*  --  44*  CREATININE 4.52*  --  3.64*   Liver Panel  Recent Labs  01/03/14 1037 01/04/14 0452  PROT 4.6* 4.3*  ALBUMIN 2.1* 1.9*  AST 51* 51*  ALT 297* 208*  ALKPHOS 71 62  BILITOT 1.0 0.7   Sedimentation Rate No results found for this basename: ESRSEDRATE,  in the last 72 hours C-Reactive Protein No results found for this basename: CRP,  in the last 72 hours  Microbiology: Recent Results (from the past 240 hour(s))  CULTURE, BLOOD (ROUTINE X 2)     Status: None   Collection Time    01/01/14  1:53 PM      Result Value Ref Range Status   Specimen Description BLOOD RIGHT HAND   Final   Special Requests BOTTLES DRAWN AEROBIC AND ANAEROBIC 5 CC   Final   Culture  Setup Time     Final   Value: 01/01/2014 22:00     Performed at [facility]   Culture     Final   Value:        BLOOD CULTURE RECEIVED NO GROWTH TO DATE CULTURE WILL BE HELD FOR 5 DAYS BEFORE ISSUING A  FINAL NEGATIVE REPORT     Performed at [facility]   Report Status PENDING   Incomplete  CULTURE, BLOOD (ROUTINE X 2)     Status: None   Collection Time    01/01/14  2:02 PM      Result Value Ref Range Status   Specimen Description BLOOD PORTA CATH   Final   Special Requests BOTTLES DRAWN AEROBIC AND ANAEROBIC 10 CC   Final   Culture  Setup Time     Final   Value: 01/01/2014 21:58     Performed at [facility]   Culture     Final   Value:        BLOOD CULTURE RECEIVED NO GROWTH TO DATE CULTURE WILL BE HELD FOR 5 DAYS BEFORE ISSUING A FINAL NEGATIVE REPORT     Note: Culture results may be compromised due to an excessive volume of blood received in culture bottles.     Performed at [facility]   Report Status PENDING   Incomplete  MRSA PCR SCREENING     Status: None   Collection Time    01/01/14  6:31 PM      Result Value Ref Range Status   MRSA by PCR NEGATIVE  NEGATIVE Final   Comment:            The GeneXpert MRSA Assay (FDA     approved for NASAL specimens     only), is one component of a     comprehensive MRSA colonization     surveillance program. It is not     intended to diagnose MRSA     infection nor to guide or     monitor treatment for     MRSA infections.  URINE CULTURE     Status: None   Collection Time    01/03/14  6:46 AM      Result Value Ref Range Status   Specimen Description URINE, CLEAN CATCH   Final   Special Requests NONE   Final   Culture  Setup Time     Final   Value: 01/03/2014 12:42     Performed at [facility]     Final  Value: >=100,000 COLONIES/ML     Performed at [facility]   Culture     Final   Value: DIPHTHEROIDS(CORYNEBACTERIUM SPECIES)     Note: Standardized susceptibility testing for this organism is not available.     Performed at [facility]   Report Status 01/04/2014 FINAL   Final    Studies/Results: Ct Chest Wo Contrast  01/03/2014   CLINICAL DATA:  Fevers,  multiple myeloma, acute renal failure  EXAM: CT CHEST WITHOUT CONTRAST  TECHNIQUE: Multidetector CT imaging of the chest was performed following the standard protocol without IV contrast.  COMPARISON:  None.  FINDINGS: The central airways are patent. There is patchy airspace disease in the right upper lobe, right middle lobe, right lower lobe, left lower lobe and left upper lobe. There is dense consolidation in the left upper lobe. There are bilateral small pleural effusions. There is no pneumothorax.  There are no pathologically enlarged axillary, hilar or mediastinal lymph nodes.  The heart size is mildly enlarged. There is no pericardial effusion. The thoracic aorta is normal in caliber. Right-sided Port-A-Cath in satisfactory position. Dual-lumen left-sided central venous catheter in satisfactory position.  Review of bone windows demonstrates small lucent lesions within the sternum and thoracic spine vertebral bodies consistent with patient's known history of multiple myeloma.  Limited non-contrast images of the upper abdomen were obtained. The adrenal glands appear normal. The remainder of the visualized abdominal organs are unremarkable.  IMPRESSION: 1. Multilobar pneumonia with dense consolidation in the left upper lobe. Atypical infection including fungal infection is in the differential diagnosis. 2. Small lucent lesions within the sternum and thoracic spine vertebral bodies consistent with patient's known history of multiple myeloma.   Electronically Signed   By: Kathreen Devoid   On: 01/03/2014 18:35     Assessment/Plan: HCAP, ? Fungal  Multiple Myeloma  Transaminitis  Total days of antibiotics: 5 vanco/imipenem  Await CMV PCR Await fungal serologies Check galactomanin Low grade temp overnight (100.5) Repeat CXR in AM Could go home with ceftaz/vanco at HD to complete 14 days.  Repeat CXR in 1 week         Bobby Rumpf Infectious Diseases (pager)  [phone] [email or web address] 01/05/2014, 6:20 PM  LOS: 4 days

## 2014-01-05 NOTE — Progress Notes (Signed)
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 Jana Hakim locally and a Dr. Melba Coon in [place]. She'd experienced progressive renal failure do to the multiple myeloma and was a recent start on hemodialysis. She also has underlying history of atrial fibrillation on Coumadin with a recent failed attempt at cardioversion on 9/17 2015. She also has a diagnosis of moderate to severe mitral regurg with normal LV function. Since the recent cardioversion attempts she has noted progressive weakness, dyspnea on exertion, cough without sputum as well as chills for 2 days with fever.  Upon presentation to the emergency department she was found to have potassium of 5.9, hemoglobin 7.7, white count 2000, and a chest x-ray with dense left upper lobe consolidation with concomitant left lower lobe atelectasis versus opacity as well. Admitting evaluation was consistent with likely pneumonia. She's been started on broad-spectrum antibiotics to cover for HCAP in an immunocompromised patient. Oncology was consulted. Chemotherapy currently on hold. Nephrologist has been consulted regarding continuing patient's usual hemodialysis  Since admission despite the severity of her chest x-ray findings patient has been relatively stable. Follow up CXR 9/22 significantly improved. Her blood pressure was a little soft but has improved after Nephro added Midodrine. She has not required oxygen. She has a new transaminitis (elevated AST/ALT) with normal total bilirubin but current trend is downward. On 9/15 LFTs were completely normal. Cardiology has been consulted as well as EP regarding the atrial fibrillation. Cardizem has been dc'd in favor of Atenolol. Dr Jana Hakim has also evaluated the patient.    HPI/Subjective: Feeling better Would like to go home after dialysis tomm   Assessment/Plan:     HCAP (healthcare-associated pneumonia) Currently not hypoxemic despite significant dense infiltrate left upper lobe-add incentive spirometry and flutter valve-continue empiric broad-spectrum antibiotics-agree with holding chemotherapy at this juncture -ask ID to see-- continue with current treatment -CT Scan done 9/22 showed: Multilobar pneumonia with dense consolidation in the left upper lobe. Atypical infection including fungal infection is in the differential diagnosis. Small lucent lesions within the sternum and thoracic spine vertebral bodies consistent with patient's known history of multiple myeloma. 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  Baseline hemoglobin anywhere between 7.9 and 9.2 and appears to require transfusions in the past-current hemoglobin 7.3 so continue to follow-hgb up to 9.7 after dialysis (volume removal)    Chronic kidney disease stage V requiring chronic dialysis/relative hypotension Nephrology 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-Nephro has begun Midodrine    Neutropenia, unspecified Baseline leukopenia with WBC around 3000 presumed related to ongoing chemotherapy  Thrombocytopenia Platelets have been normal most recent reading 214,000 on 9/15 and now with acute thrombocytopenia with platelets 71,000-follow closely suspect related to sepsis + chemo--LDH 370     Multiple myeloma Oncology has seen pt this am-chemotherapeutic agents on hold    Transaminitis Etiology uncertain but suspect from shock liver as well as  underlying medications noting the chemotherapeutic agents as well as the patient's Septra can cause hepatotoxicity-total bilirubin normal and ALT higher than AST and trend is downward     DVT prophylaxis: SCDs Code Status: Partial Family Communication: No other family at bedside Disposition Plan/Expected LOS:   Consultants: Cardiology Nephrology EP Oncology  Procedures: None  Cultures: 9/20 cultures x2  9/21 Urine cx- DIPHTHEROIDS(CORYNEBACTERIUM SPECIES)   Antibiotics: Cefepime 9/20 >>> Acyclovir 9/20 >>> Vancomycin 9/20 >>>  Objective: Blood pressure 103/64, pulse 75, temperature 97.8 F (36.6 C), temperature source Oral, resp. rate 18, height 5' 2"  (1.575 m), weight 70 kg (154 lb 5.2 oz), last menstrual period 04/20/2011, SpO2 96.00%.  Intake/Output Summary (Last 24 hours) at 01/05/14 0809 Last data filed at 01/04/14 2058  Gross per 24 hour  Intake    240 ml  Output   3000 ml  Net  -2760 ml   Exam: Gen: No acute respiratory distress Chest: no wheezing, room air Cardiac: Irregular rate and rhythm, S1-S2, no rubs murmurs or gallops, no peripheral edema, no JVD Abdomen: Soft nontender nondistended without obvious hepatosplenomegaly, no ascites Extremities: Symmetrical in appearance without cyanosis, clubbing or effusion  Scheduled Meds:  Scheduled Meds: . atenolol  25 mg Oral BID  . calcium carbonate  2 tablet Oral TID BM  . docusate sodium  100 mg Oral BID  . doxercalciferol  3 mcg Intravenous Q M,W,F-HD  . feeding supplement (NEPRO CARB STEADY)  237 mL Oral BID BM  . imipenem-cilastatin  250 mg Intravenous Q12H  . ipratropium-albuterol  3 mL Nebulization TID  . midodrine  10 mg Oral Q M,W,F-HD  . valACYclovir  500 mg Oral QODAY  . vancomycin  750 mg Intravenous Q M,W,F-HD  . Warfarin - Pharmacist  Dosing Inpatient   Does not apply q1800   Data Reviewed: Basic Metabolic Panel:  Recent Labs Lab 01/01/14 1356 01/02/14 0440 01/03/14 0440 01/03/14 1037 01/04/14 0452  NA 135* 138  --  138 133*  K 5.9* 4.5  --  3.7 3.8  CL 95* 99  --  98 94*  CO2 23 25  --  28 24  GLUCOSE 106* 92  --  123* 103*  BUN 60* 31*  --  38* 54*  CREATININE 5.68* 3.27*  --  3.34* 4.52*  CALCIUM 6.9* 6.7*  --  6.5* 5.9*  MG  --   --  1.8  --   --   PHOS  --   --  2.4  --   --    Liver Function Tests:  Recent Labs Lab 01/02/14 0440 01/03/14 1037 01/04/14 0452  AST 94* 51* 51*  ALT 493* 297* 208*  ALKPHOS 84 71 62  BILITOT 1.2 1.0 0.7  PROT 4.7* 4.6* 4.3*  ALBUMIN 2.4* 2.1* 1.9*   CBC:  Recent Labs Lab 01/01/14 1356 01/02/14 0440 01/03/14 0515 01/03/14 1037 01/04/14 1522  WBC 2.0* 1.3* 9.2 2.0* 2.8*  NEUTROABS 1.7 1.1*  --  1.3*  --   HGB 7.7* 7.3* 10.3* 9.7* 9.7*  HCT 23.4* 22.2* 38.3 28.4* 28.0*  MCV 91.1 92.9 86.3 88.8 88.1  PLT 87* 71* 253 64* 59*   Cardiac Enzymes:  Recent Labs Lab 01/01/14 1553 01/01/14 2212 01/02/14 0440  TROPONINI <0.30 <0.30 <0.30   BNP (last 3 results)  Recent Labs  02/07/13 1442 04/29/13 1220 01/01/14 1320  PROBNP 597.10* 194.0* >70000.0*    Recent Results (from the past 240 hour(s))  CULTURE, BLOOD (ROUTINE X 2)  Status: None   Collection Time    01/01/14  1:53 PM      Result Value Ref Range Status   Specimen Description BLOOD RIGHT HAND   Final   Special Requests BOTTLES DRAWN AEROBIC AND ANAEROBIC 5 CC   Final   Culture  Setup Time     Final   Value: 01/01/2014 22:00     Performed at [facility]   Culture     Final   Value:        BLOOD CULTURE RECEIVED NO GROWTH TO DATE CULTURE WILL BE HELD FOR 5 DAYS BEFORE ISSUING A FINAL NEGATIVE REPORT     Performed at [facility]   Report Status PENDING   Incomplete  CULTURE, BLOOD (ROUTINE X 2)     Status: None   Collection Time    01/01/14  2:02 PM      Result  Value Ref Range Status   Specimen Description BLOOD PORTA CATH   Final   Special Requests BOTTLES DRAWN AEROBIC AND ANAEROBIC 10 CC   Final   Culture  Setup Time     Final   Value: 01/01/2014 21:58     Performed at [facility]   Culture     Final   Value:        BLOOD CULTURE RECEIVED NO GROWTH TO DATE CULTURE WILL BE HELD FOR 5 DAYS BEFORE ISSUING A FINAL NEGATIVE REPORT     Note: Culture results may be compromised due to an excessive volume of blood received in culture bottles.     Performed at [facility]   Report Status PENDING   Incomplete  MRSA PCR SCREENING     Status: None   Collection Time    01/01/14  6:31 PM      Result Value Ref Range Status   MRSA by PCR NEGATIVE  NEGATIVE Final   Comment:            The GeneXpert MRSA Assay (FDA     approved for NASAL specimens     only), is one component of a     comprehensive MRSA colonization     surveillance program. It is not     intended to diagnose MRSA     infection nor to guide or     monitor treatment for     MRSA infections.  URINE CULTURE     Status: None   Collection Time    01/03/14  6:46 AM      Result Value Ref Range Status   Specimen Description URINE, CLEAN CATCH   Final   Special Requests NONE   Final   Culture  Setup Time     Final   Value: 01/03/2014 12:42     Performed at [place]     Final   Value: >=100,000 COLONIES/ML     Performed at [facility]   Culture     Final   Value: DIPHTHEROIDS(CORYNEBACTERIUM SPECIES)     Note: Standardized susceptibility testing for this organism is not available.     Performed at [facility]   Report Status 01/04/2014 FINAL   Final     Studies:  Recent x-ray studies have been reviewed in detail by the Attending Physician  Time spent :  25 mins  Eulogio Bear DO [facility] Pager [phone]  On-Call/Text Page:      [email or web address]      password [facility]  If 7PM-7AM, please contact  night-coverage [email or web address] Password [email or web address] 01/05/2014, 8:09 AM   LOS: 4 days

## 2014-01-06 ENCOUNTER — Inpatient Hospital Stay (HOSPITAL_COMMUNITY): Payer: BC Managed Care – PPO

## 2014-01-06 LAB — RENAL FUNCTION PANEL
Albumin: 1.9 g/dL — ABNORMAL LOW (ref 3.5–5.2)
Anion gap: 13 (ref 5–15)
BUN: 63 mg/dL — ABNORMAL HIGH (ref 6–23)
CALCIUM: 6.6 mg/dL — AB (ref 8.4–10.5)
CHLORIDE: 97 meq/L (ref 96–112)
CO2: 26 meq/L (ref 19–32)
Creatinine, Ser: 4.84 mg/dL — ABNORMAL HIGH (ref 0.50–1.10)
GFR, EST AFRICAN AMERICAN: 11 mL/min — AB (ref >90)
GFR, EST NON AFRICAN AMERICAN: 9 mL/min — AB (ref >90)
Glucose, Bld: 106 mg/dL — ABNORMAL HIGH (ref 70–99)
Phosphorus: 1.8 mg/dL — ABNORMAL LOW (ref 2.3–4.6)
Potassium: 4.7 mEq/L (ref 3.7–5.3)
SODIUM: 136 meq/L — AB (ref 137–147)

## 2014-01-06 LAB — PROTIME-INR
INR: 2.68 — ABNORMAL HIGH (ref 0.00–1.49)
PROTHROMBIN TIME: 28.5 s — AB (ref 11.6–15.2)

## 2014-01-06 LAB — CBC
HCT: 27 % — ABNORMAL LOW (ref 36.0–46.0)
Hemoglobin: 9.2 g/dL — ABNORMAL LOW (ref 12.0–15.0)
MCH: 30.8 pg (ref 26.0–34.0)
MCHC: 34.1 g/dL (ref 30.0–36.0)
MCV: 90.3 fL (ref 78.0–100.0)
Platelets: 98 10*3/uL — ABNORMAL LOW (ref 150–400)
RBC: 2.99 MIL/uL — AB (ref 3.87–5.11)
RDW: 19.4 % — ABNORMAL HIGH (ref 11.5–15.5)
WBC: 3.4 10*3/uL — AB (ref 4.0–10.5)

## 2014-01-06 MED ORDER — HEPARIN SODIUM (PORCINE) 1000 UNIT/ML DIALYSIS
500.0000 [IU] | Freq: Once | INTRAMUSCULAR | Status: DC
  Filled 2014-01-06: qty 1

## 2014-01-06 MED ORDER — ONDANSETRON HCL 4 MG PO TABS: 4.0000 mg | ORAL_TABLET | Freq: Four times a day (QID) | ORAL | Status: AC | PRN

## 2014-01-06 MED ORDER — MIDODRINE HCL 10 MG PO TABS: 10.0000 mg | ORAL_TABLET | ORAL | Status: AC

## 2014-01-06 MED ORDER — ATENOLOL 25 MG PO TABS: 25.0000 mg | ORAL_TABLET | Freq: Two times a day (BID) | ORAL | Status: AC

## 2014-01-06 MED ORDER — HEPARIN SOD (PORK) LOCK FLUSH 100 UNIT/ML IV SOLN
500.0000 [IU] | INTRAVENOUS | Status: AC | PRN
  Administered 2014-01-06: 500 [IU]

## 2014-01-06 MED ORDER — NEPRO/CARBSTEADY PO LIQD: 237.0000 mL | ORAL | Status: DC | PRN

## 2014-01-06 MED ORDER — VANCOMYCIN HCL IN DEXTROSE 750-5 MG/150ML-% IV SOLN: 750.0000 mg | INTRAVENOUS | Status: AC

## 2014-01-06 MED ORDER — ALTEPLASE 2 MG IJ SOLR: 2.0000 mg | Freq: Once | INTRAMUSCULAR | Status: DC | PRN

## 2014-01-06 MED ORDER — SODIUM CHLORIDE 0.9 % IV SOLN: 100.0000 mL | INTRAVENOUS | Status: DC | PRN

## 2014-01-06 MED ORDER — MIDODRINE HCL 5 MG PO TABS
ORAL_TABLET | ORAL | Status: AC
  Filled 2014-01-06: qty 2

## 2014-01-06 MED ORDER — LIDOCAINE-PRILOCAINE 2.5-2.5 % EX CREA: 1.0000 "application " | TOPICAL_CREAM | CUTANEOUS | Status: DC | PRN

## 2014-01-06 MED ORDER — DOXERCALCIFEROL 4 MCG/2ML IV SOLN
INTRAVENOUS | Status: AC
  Administered 2014-01-06: 3 ug via INTRAVENOUS
  Filled 2014-01-06: qty 2

## 2014-01-06 MED ORDER — PENTAFLUOROPROP-TETRAFLUOROETH EX AERO: 1.0000 "application " | INHALATION_SPRAY | CUTANEOUS | Status: DC | PRN

## 2014-01-06 MED ORDER — HEPARIN SODIUM (PORCINE) 1000 UNIT/ML DIALYSIS
1000.0000 [IU] | INTRAMUSCULAR | Status: DC | PRN
  Filled 2014-01-06: qty 1

## 2014-01-06 MED ORDER — WARFARIN SODIUM 5 MG PO TABS
5.0000 mg | ORAL_TABLET | Freq: Once | ORAL | Status: DC
  Filled 2014-01-06: qty 1

## 2014-01-06 MED ORDER — LIDOCAINE HCL (PF) 1 % IJ SOLN: 5.0000 mL | INTRAMUSCULAR | Status: DC | PRN

## 2014-01-06 MED ORDER — DEXTROSE 5 % IV SOLN: 2.0000 g | INTRAVENOUS | Status: AC

## 2014-01-06 MED ORDER — WARFARIN SODIUM 5 MG PO TABS: ORAL_TABLET | ORAL | Status: AC

## 2014-01-06 MED ORDER — HYDROCODONE-ACETAMINOPHEN 5-325 MG PO TABS: 1.0000 | ORAL_TABLET | ORAL | Status: AC | PRN

## 2014-01-06 MED ORDER — IPRATROPIUM-ALBUTEROL 0.5-2.5 (3) MG/3ML IN SOLN: 3.0000 mL | Freq: Three times a day (TID) | RESPIRATORY_TRACT | Status: AC

## 2014-01-06 MED ORDER — DEXTROSE 5 % IV SOLN: 1.0000 g | INTRAVENOUS | Status: DC

## 2014-01-06 MED ORDER — CALCIUM CARBONATE ANTACID 500 MG PO CHEW: 2.0000 | CHEWABLE_TABLET | Freq: Three times a day (TID) | ORAL | Status: AC

## 2014-01-06 NOTE — Discharge Summary (Signed)
Physician Discharge Summary  Laura Hester:[id number] DOB: 11/23/1955 DOA: 01/01/2014  PCP: Gara Kroner, MD  Admit date: 01/01/2014 Discharge date: 01/06/2014  Recommendations for Outpatient Follow-up:  1. Pt will need to follow up with PCP in 2-3 weeks post discharge 2. Please obtain BMP with HD 3. Please also check CBC to evaluate Hg and Hct levels 4. Continue Vanc and Ceftaz as noted below with HD for 9 more days post discharge   Discharge Diagnoses:  Active Problems:   Multiple myeloma   Anemia in neoplastic disease   CKD (chronic kidney disease) stage V requiring chronic dialysis   Atrial fibrillation with RVR   HCAP (healthcare-associated pneumonia)   Neutropenia, unspecified   Transaminitis   Sepsis   Discharge Condition: Stable  Diet recommendation: Heart healthy diet discussed in details   History of present illness:  58 year old white female with known history of IgA lambda light chain myeloma status post stem cell transplant, on chronic chemotherapy followed by Dr. Jana Hakim locally and a Dr. Melba Coon in [place]. She'd experienced progressive renal failure due to the multiple myeloma and was a recently started on hemodialysis. She also has underlying history of atrial fibrillation on Coumadin with a recent failed attempt at cardioversion on 12/29/2013. She also has a diagnosis of moderate to severe mitral regurg with normal LV function. Since the recent cardioversion attempts she has noted progressive weakness, dyspnea on exertion, cough without sputum as well as chills for 2 days with fever.   Upon presentation to the emergency department she was found to have potassium of 5.9, hemoglobin 7.7, white count 2000, and a chest x-ray with dense left upper lobe consolidation with concomitant left lower lobe atelectasis versus opacity as well. Admitting evaluation was consistent with likely pneumonia. She's been started on broad-spectrum antibiotics to cover for HCAP  in an immunocompromised patient. Oncology was consulted. Chemotherapy currently on hold. Nephrologist has been consulted regarding continuing patient's usual hemodialysis   Since admission despite the severity of her chest x-ray findings patient has been relatively stable. Follow up CXR 9/22 significantly improved. Her blood pressure was a little soft but has improved after Nephro added Midodrine. She has not required oxygen. She has a new transaminitis (elevated AST/ALT) with normal total bilirubin but current trend is downward. On 9/15 LFTs were completely normal. Cardiology has been consulted as well as EP regarding the atrial fibrillation. Cardizem has been dc'd in favor of Atenolol. Dr Jana Hakim has also evaluated the patient.   Assessment/Plan:  HCAP (healthcare-associated pneumonia)  Currently not hypoxemic despite significant dense infiltrate left upper lobe-add incentive spirometry and flutter valve-continue empiric broad-spectrum antibiotics-agree with holding chemotherapy at this juncture  - continue vanc and ceftaz for 9 more days post discharge with HD sessions  -CT Scan done 9/22 showed: Multilobar pneumonia with dense consolidation in the left upper lobe. Atypical infection including fungal infection is in the differential diagnosis. Small lucent lesions within the sternum and thoracic spine vertebral bodies consistent with patient's known history of multiple myeloma.  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 Stable overall  Thrombocytopenia  Platelets have been normal most recent reading 214,000 on 9/15 and now with acute thrombocytopenia with platelets 71,000-follow closely suspect related to sepsis + chemo--LDH 370  Multiple myeloma  Oncology has seen pt this am-chemotherapeutic agents on hold  Transaminitis  Etiology uncertain but suspect from shock liver as well as underlying medications noting the chemotherapeutic agents as well as the patient's Septra can cause hepatotoxicity-total bilirubin normal and ALT higher than AST and trend is downward   Code Status: Partial   Consultants:   Cardiology   Nephrology   EP   Oncology   Cultures:   9/20 cultures x2   9/21 Urine cx- DIPHTHEROIDS(CORYNEBACTERIUM SPECIES)  Antibiotics:  Ceftaaz upon discharge for 9 more days  Vancomycin upon discharge for 9 more days    Procedures/Studies:  Dg Chest 2 View  01/06/2014  No marked change in bilateral airspace disease, worst in the left upper lobe.   Dg Chest 2 View 01/03/2014  Progressive nodular infiltrates. Persistent density in the left upper lobe is probably pneumonia but the possibility of a mass should be considered. Atypical infection should be considered.    Ct Chest Wo Contrast  01/03/2014   Multilobar pneumonia with dense consolidation in the left upper lobe. Atypical infection including fungal infection is in the differential diagnosis. 2. Small lucent lesions within the sternum and thoracic spine vertebral bodies consistent with  patient's known history of multiple myeloma.    Dg Chest Port 1 View  01/01/2014    Left upper lobe consolidation consistent with pneumonia. Milder opacity at the left lung base which may reflect additional pneumonia or be due to atelectasis, likely with a small associated pleural effusion.   Discharge Exam: Filed Vitals:   01/06/14 0929  BP: 124/70  Pulse: 82  Temp:   Resp:    Filed Vitals:   01/06/14 0753 01/06/14 0830 01/06/14 0900 01/06/14 0929  BP: 122/53 124/53 131/69 124/70  Pulse: 82 76 80 82  Temp:      TempSrc:      Resp:      Height:      Weight:      SpO2:        General: Pt is alert, follows commands appropriately, not in acute distress Cardiovascular: Regular rate and rhythm, no rubs, no gallops Respiratory: Clear to auscultation bilaterally, no wheezing, no crackles, no rhonchi Abdominal: Soft, non tender, non distended, bowel sounds +, no guarding  Discharge Instructions  Discharge Instructions   Diet - low sodium heart healthy    Complete by:  As directed      Increase activity slowly    Complete by:  As directed             Medication List         atenolol 25 MG tablet  Commonly known as:  TENORMIN  Take 1 tablet (25 mg total) by mouth 2 (two) times daily.     calcium carbonate 500 MG chewable tablet  Commonly known as:  TUMS - dosed in mg elemental calcium  Chew 2 tablets (400 mg of elemental calcium total) by mouth 3 (three) times  daily between meals.     cefTAZidime 2 g in dextrose 5 % 50 mL  Inject 2 g into the vein every Monday, Wednesday, and Friday with hemodialysis.     Cyclophosphamide 50 MG Caps  Take 8 tablets by mouth once a week.     dexamethasone 4 MG tablet  Commonly known as:  DECADRON  10 tabs weekly     DIALYVITE 5000 PO  Take 1 tablet by mouth daily.     diltiazem 30 MG tablet  Commonly known as:  CARDIZEM  Take 30 mg by mouth 4 (four) times daily.     HYDROcodone-acetaminophen 5-325 MG per tablet  Commonly known  as:  NORCO/VICODIN  Take 1-2 tablets by mouth every 4 (four) hours as needed for moderate pain.     ipratropium-albuterol 0.5-2.5 (3) MG/3ML Soln  Commonly known as:  DUONEB  Take 3 mLs by nebulization 3 (three) times daily.     lidocaine-prilocaine cream  Commonly known as:  EMLA  Apply topically as needed.     midodrine 10 MG tablet  Commonly known as:  PROAMATINE  Take 1 tablet (10 mg total) by mouth every Monday, Wednesday, and Friday with hemodialysis.     ondansetron 4 MG tablet  Commonly known as:  ZOFRAN  Take 1 tablet (4 mg total) by mouth every 6 (six) hours as needed for nausea.     pomalidomide 4 MG capsule  Commonly known as:  POMALYST  Take 4 mg by mouth daily. Take with water on days 1-21. Repeat every 28 days.     valACYclovir 500 MG tablet  Commonly known as:  VALTREX  Take 500 mg by mouth every other day.     Vancomycin 750 MG/150ML Soln  Commonly known as:  VANCOCIN  Inject 150 mLs (750 mg total) into the vein every Monday, Wednesday, and Friday with hemodialysis.     warfarin 5 MG tablet  Commonly known as:  COUMADIN  Take 1 tablet tonight 9/25 and starting 9/26 take 2.5 mg tablet daily Saturday and Sunday), follow up in coumadin clinic on Monday 9/28            Follow-up Information   Schedule an appointment as soon as possible for a visit with Gara Kroner, MD.   Specialty:  Family Medicine   Contact information:   [address], [place] [address] [phone]       Follow up with Faye Ramsay, MD. (As needed call my cell phone [phone])    Specialty:  Internal Medicine   Contact information:   [address] [place] [address] [phone]        The results of significant diagnostics from this hospitalization (including imaging, microbiology, ancillary and laboratory) are listed below for reference.     Microbiology: Recent Results (from the past 240 hour(s))  CULTURE, BLOOD  (ROUTINE X 2)     Status: None   Collection Time    01/01/14  1:53 PM      Result Value Ref Range Status   Specimen Description BLOOD RIGHT HAND   Final   Special Requests BOTTLES DRAWN AEROBIC AND ANAEROBIC 5 CC   Final   Culture  Setup Time     Final   Value: 01/01/2014 22:00     Performed at [facility]   Culture     Final   Value:        BLOOD CULTURE RECEIVED NO GROWTH TO DATE  CULTURE WILL BE HELD FOR 5 DAYS BEFORE ISSUING A FINAL NEGATIVE REPORT     Performed at [facility]   Report Status PENDING   Incomplete  CULTURE, BLOOD (ROUTINE X 2)     Status: None   Collection Time    01/01/14  2:02 PM      Result Value Ref Range Status   Specimen Description BLOOD PORTA CATH   Final   Special Requests BOTTLES DRAWN AEROBIC AND ANAEROBIC 10 CC   Final   Culture  Setup Time     Final   Value: 01/01/2014 21:58     Performed at [facility]   Culture     Final   Value:        BLOOD CULTURE RECEIVED NO GROWTH TO DATE CULTURE WILL BE HELD FOR 5 DAYS BEFORE ISSUING A FINAL NEGATIVE REPORT     Note: Culture results may be compromised due to an excessive volume of blood received in culture bottles.     Performed at [facility]   Report Status PENDING   Incomplete  MRSA PCR SCREENING     Status: None   Collection Time    01/01/14  6:31 PM      Result Value Ref Range Status   MRSA by PCR NEGATIVE  NEGATIVE Final   Comment:            The GeneXpert MRSA Assay (FDA     approved for NASAL specimens     only), is one component of a     comprehensive MRSA colonization     surveillance program. It is not     intended to diagnose MRSA     infection nor to guide or     monitor treatment for     MRSA infections.  URINE CULTURE     Status: None   Collection Time    01/03/14  6:46 AM      Result Value Ref Range Status   Specimen Description URINE, CLEAN CATCH   Final   Special Requests NONE   Final   Culture  Setup Time     Final   Value: 01/03/2014 12:42      Performed at [facility]     Final   Value: >=100,000 COLONIES/ML     Performed at [facility]   Culture     Final   Value: DIPHTHEROIDS(CORYNEBACTERIUM SPECIES)     Note: Standardized susceptibility testing for this organism is not available.     Performed at [facility]   Report Status 01/04/2014 FINAL   Final     Labs: Basic Metabolic Panel:  Recent Labs Lab 01/02/14 0440 01/03/14 0440 01/03/14 1037 01/04/14 0452 01/05/14 0830 01/06/14 0731  NA 138  --  138 133* 137 136*  K 4.5  --  3.7 3.8 4.1 4.7  CL 99  --  98 94* 99 97  CO2 25  --  _0 GLUCOSE 92  --  123* 103* 117* 106*  BUN 31*  --  38* 54* 44* 63*  CREATININE 3.27*  --  3.34* 4.52* 3.64* 4.84*  CALCIUM 6.7*  --  6.5* 5.9* 6.5* 6.6*  MG  --  1.8  --   --   --   --   PHOS  --  2.4  --   --   --  1.8*   Liver Function Tests:  Recent Labs Lab  01/02/14 0440 01/03/14 1037 01/04/14 0452 01/06/14 0731  AST 94* 51* 51*  --   ALT 493* 297* 208*  --   ALKPHOS 84 71 62  --   BILITOT 1.2 1.0 0.7  --   PROT 4.7* 4.6* 4.3*  --   ALBUMIN 2.4* 2.1* 1.9* 1.9*   CBC:  Recent Labs Lab 01/01/14 1356 01/02/14 0440 01/03/14 0515 01/03/14 1037 01/04/14 1522 01/05/14 0830 01/06/14 0731  WBC 2.0* 1.3* 9.2 2.0* 2.8* 2.8* 3.4*  NEUTROABS 1.7 1.1*  --  1.3*  --   --   --   HGB 7.7* 7.3* 10.3* 9.7* 9.7* 9.0* 9.2*  HCT 23.4* 22.2* 38.3 28.4* 28.0* 26.9* 27.0*  MCV 91.1 92.9 86.3 88.8 88.1 89.7 90.3  PLT 87* 71* 253 64* 59* 64* 98*   Cardiac Enzymes:  Recent Labs Lab 01/01/14 1553 01/01/14 2212 01/02/14 0440  TROPONINI <0.30 <0.30 <0.30   BNP: BNP (last 3 results)  Recent Labs  02/07/13 1442 04/29/13 1220 01/01/14 1320  PROBNP 597.10* 194.0* >70000.0*   SIGNED: Time coordinating discharge: Over 30 minutes  Faye Ramsay, MD  [facility] 01/06/2014, 9:51 AM Pager [phone]  If 7PM-7AM, please contact  night-coverage [email or web address] Password [email or web address]

## 2014-01-06 NOTE — Procedures (Signed)
I was present at this dialysis session, have reviewed the session itself and made  appropriate changes  Kelly Splinter MD (pgr) [phone]    [phone] 01/06/2014, 10:14 AM

## 2014-01-06 NOTE — Progress Notes (Signed)
ANTICOAGULATION & ANTIBIOTIC CONSULT NOTE - Follow Up Consult  Pharmacy Consult for Warfarin Indication: Afib   Allergies  Allergen Reactions  . Wellbutrin [Bupropion Hcl] Hives    Patient Measurements: Height: 5\' 2"  (157.5 cm) Weight: 151 lb 14.4 oz (68.9 kg) IBW/kg (Calculated) : 50.1  Vital Signs: Temp: 97.1 F (36.2 C) (09/25 1040) Temp src: Oral (09/25 0708) BP: 119/57 mmHg (09/25 1048) Pulse Rate: 74 (09/25 1048)  Labs:  Recent Labs  01/04/14 0452  01/04/14 1522 01/05/14 0830 01/06/14 0545 01/06/14 0731  HGB  --   < > 9.7* 9.0*  --  9.2*  HCT  --   --  28.0* 26.9*  --  27.0*  PLT  --   --  59* 64*  --  98*  LABPROT 25.1*  --   --  31.5* 28.5*  --   INR 2.28*  --   --  3.04* 2.68*  --   CREATININE 4.52*  --   --  3.64*  --  4.84*  < > = values in this interval not displayed.  Estimated Creatinine Clearance: 11.7 ml/min (by C-G formula based on Cr of 4.84).   Assessment: 34 YOF who continues on warfarin for Afib s/p unsuccessfully DCCV on 9/17 with a slightly therapeutic INR this morning (INR 2.68 << 3.04, goal of 2-3). The patient is holding oral chemo meds for MM - which can reduce warfarin sensitivity however is noted to have elevated LFTs which can increase warfarin sensitivity. Will continue dosing cautiously.  Noted plans for possible discharge today. Upon discharge, would recommend 5 mg x 1 dose this evening followed by 2.5 mg on Sat/Sun with a INR check on Monday, 9/28 to guide further doses.  Goal of Therapy:  INR 2-3   Plan:  1. Warfarin 5 mg x 1 dose today 2. Will continue to monitor for any signs/symptoms of bleeding and will follow up with PT/INR in the a.m.   Alycia Rossetti, PharmD, BCPS Clinical Pharmacist Pager: [phone] 01/06/2014 11:27 AM

## 2014-01-06 NOTE — Progress Notes (Signed)
Pt with episode of dry heaving and emesis x 1.  Offered cool compress.  Heaving seems to subside.  Will continue to monitor. Dorthey Sawyer, RN

## 2014-01-06 NOTE — Progress Notes (Signed)
INFECTIOUS DISEASE PROGRESS NOTE  ID: SEFORA TIETJE is a 58 y.o. female with  Active Problems:   Multiple myeloma   Anemia in neoplastic disease   CKD (chronic kidney disease) stage V requiring chronic dialysis   Atrial fibrillation with RVR   HCAP (healthcare-associated pneumonia)   Neutropenia, unspecified   Transaminitis   Sepsis  Subjective: Wants to go home.   Abtx:  Anti-infectives   Start     Dose/Rate Route Frequency Ordered Stop   01/06/14 0000  Vancomycin (VANCOCIN) 750 MG/150ML SOLN     750 mg 150 mL/hr over 60 Minutes Intravenous Every M-W-F (Hemodialysis) 01/06/14 0944     01/06/14 0000  cefTAZidime 1 g in dextrose 5 % 50 mL  Status:  Discontinued     1 g 100 mL/hr over 30 Minutes Intravenous Every M-W-F (Hemodialysis) 01/06/14 0944 01/06/14    01/06/14 0000  cefTAZidime 2 g in dextrose 5 % 50 mL     2 g 100 mL/hr over 30 Minutes Intravenous Every M-W-F (Hemodialysis) 01/06/14 0948     01/04/14 1700  imipenem-cilastatin (PRIMAXIN) 250 mg in sodium chloride 0.9 % 100 mL IVPB     250 mg 200 mL/hr over 30 Minutes Intravenous Every 12 hours 01/04/14 1655     01/02/14 1200  vancomycin (VANCOCIN) IVPB 750 mg/150 ml premix     750 mg 150 mL/hr over 60 Minutes Intravenous Every M-W-F (Hemodialysis) 01/02/14 0053     01/02/14 1200  ceFEPIme (MAXIPIME) 2 g in dextrose 5 % 50 mL IVPB  Status:  Discontinued     2 g 100 mL/hr over 30 Minutes Intravenous Every M-W-F (Hemodialysis) 01/02/14 0053 01/04/14 1635   01/01/14 2330  vancomycin (VANCOCIN) IVPB 750 mg/150 ml premix     750 mg 150 mL/hr over 60 Minutes Intravenous  Once 01/01/14 2138 01/02/14 0016   01/01/14 2230  ceFEPIme (MAXIPIME) 1 g in dextrose 5 % 50 mL IVPB     1 g 100 mL/hr over 30 Minutes Intravenous  Once 01/01/14 2138 01/01/14 2346   01/01/14 1830  valACYclovir (VALTREX) tablet 500 mg     500 mg Oral Every other day 01/01/14 1824     01/01/14 1630  vancomycin (VANCOCIN) 1,500 mg in sodium  chloride 0.9 % 500 mL IVPB     1,500 mg 250 mL/hr over 120 Minutes Intravenous  Once 01/01/14 1626 01/01/14 1928   01/01/14 1500  ceFEPIme (MAXIPIME) 1 g in dextrose 5 % 50 mL IVPB     1 g 100 mL/hr over 30 Minutes Intravenous  Once 01/01/14 1458 01/01/14 1600      Medications:  Scheduled: . atenolol  25 mg Oral BID  . calcium carbonate  2 tablet Oral TID BM  . docusate sodium  100 mg Oral BID  . doxercalciferol  3 mcg Intravenous Q M,W,F-HD  . feeding supplement (NEPRO CARB STEADY)  237 mL Oral BID BM  . [START ON 01/07/2014] heparin  500 Units Dialysis Once in dialysis  . imipenem-cilastatin  250 mg Intravenous Q12H  . ipratropium-albuterol  3 mL Nebulization TID  . midodrine  10 mg Oral Q M,W,F-HD  . valACYclovir  500 mg Oral QODAY  . vancomycin  750 mg Intravenous Q M,W,F-HD  . Warfarin - Pharmacist Dosing Inpatient   Does not apply q1800    Objective: Vital signs in last 24 hours: Temp:  [97.3 F (36.3 C)-99.7 F (37.6 C)] 97.3 F (36.3 C) (09/25 0708) Pulse Rate:  [76-94]  86 (09/25 0957) Resp:  [17-19] 19 (09/25 0708) BP: (112-141)/(52-75) 141/75 mmHg (09/25 0957) SpO2:  [95 %-97 %] 97 % (09/25 0708) Weight:  [72.3 kg (159 lb 6.3 oz)] 72.3 kg (159 lb 6.3 oz) (09/25 0708)   General appearance: alert, cooperative and no distress Resp: diminished breath sounds bilaterally Cardio: regular rate and rhythm GI: normal findings: bowel sounds normal and soft, non-tender  Lab Results  Recent Labs  01/05/14 0830 01/06/14 0731  WBC 2.8* 3.4*  HGB 9.0* 9.2*  HCT 26.9* 27.0*  NA 137 136*  K 4.1 4.7  CL 99 97  CO2 28 26  BUN 44* 63*  CREATININE 3.64* 4.84*   Liver Panel  Recent Labs  01/03/14 1037 01/04/14 0452 01/06/14 0731  PROT 4.6* 4.3*  --   ALBUMIN 2.1* 1.9* 1.9*  AST 51* 51*  --   ALT 297* 208*  --   ALKPHOS 71 62  --   BILITOT 1.0 0.7  --    Sedimentation Rate No results found for this basename: ESRSEDRATE,  in the last 72 hours C-Reactive  Protein No results found for this basename: CRP,  in the last 72 hours  Microbiology: Recent Results (from the past 240 hour(s))  CULTURE, BLOOD (ROUTINE X 2)     Status: None   Collection Time    01/01/14  1:53 PM      Result Value Ref Range Status   Specimen Description BLOOD RIGHT HAND   Final   Special Requests BOTTLES DRAWN AEROBIC AND ANAEROBIC 5 CC   Final   Culture  Setup Time     Final   Value: 01/01/2014 22:00     Performed at [facility]   Culture     Final   Value:        BLOOD CULTURE RECEIVED NO GROWTH TO DATE CULTURE WILL BE HELD FOR 5 DAYS BEFORE ISSUING A FINAL NEGATIVE REPORT     Performed at [facility]   Report Status PENDING   Incomplete  CULTURE, BLOOD (ROUTINE X 2)     Status: None   Collection Time    01/01/14  2:02 PM      Result Value Ref Range Status   Specimen Description BLOOD PORTA CATH   Final   Special Requests BOTTLES DRAWN AEROBIC AND ANAEROBIC 10 CC   Final   Culture  Setup Time     Final   Value: 01/01/2014 21:58     Performed at [facility]   Culture     Final   Value:        BLOOD CULTURE RECEIVED NO GROWTH TO DATE CULTURE WILL BE HELD FOR 5 DAYS BEFORE ISSUING A FINAL NEGATIVE REPORT     Note: Culture results may be compromised due to an excessive volume of blood received in culture bottles.     Performed at [facility]   Report Status PENDING   Incomplete  MRSA PCR SCREENING     Status: None   Collection Time    01/01/14  6:31 PM      Result Value Ref Range Status   MRSA by PCR NEGATIVE  NEGATIVE Final   Comment:            The GeneXpert MRSA Assay (FDA     approved for NASAL specimens     only), is one component of a     comprehensive MRSA colonization     surveillance program. It is not  intended to diagnose MRSA     infection nor to guide or     monitor treatment for     MRSA infections.  URINE CULTURE     Status: None   Collection Time    01/03/14  6:46 AM      Result Value Ref Range  Status   Specimen Description URINE, CLEAN CATCH   Final   Special Requests NONE   Final   Culture  Setup Time     Final   Value: 01/03/2014 12:42     Performed at [facility]     Final   Value: >=100,000 COLONIES/ML     Performed at [facility]   Culture     Final   Value: DIPHTHEROIDS(CORYNEBACTERIUM SPECIES)     Note: Standardized susceptibility testing for this organism is not available.     Performed at [facility]   Report Status 01/04/2014 FINAL   Final    Studies/Results: Dg Chest 2 View  01/06/2014   CLINICAL DATA:  Cough and shortness of breath. History of multiple myeloma.  EXAM: CHEST  2 VIEW  COMPARISON:  Plain film of the chest and CT chest 01/03/2014.  FINDINGS: Patchy bilateral airspace disease with confluent opacity in the left upper lobe persists without marked change. Tiny left pleural effusion is noted. There is cardiomegaly. No pneumothorax is identified. Mild, multilevel thoracic spine compression fractures identified, unchanged. Scattered lytic lesions consistent with history of multiple myeloma are noted.  IMPRESSION: No marked change in bilateral airspace disease, worst in the left upper lobe.   Electronically Signed   By: Inge Rise M.D.   On: 01/06/2014 07:12     Assessment/Plan: HCAP, ? Fungal  Multiple Myeloma  Transaminitis  Total days of antibiotics: 6 vanco/imipenem  Await CMV PCR  Await fungal serologies  Await galactomanin  Repeat CXR shows no change Could go home with ceftaz/vanco at HD to complete 14 days.  Repeat CXR in 1 week If not improved, would consider CT and BAL.           Bobby Rumpf Infectious Diseases (pager) [phone] [email or web address] 01/06/2014, 10:17 AM  LOS: 5 days

## 2014-01-06 NOTE — Discharge Instructions (Signed)

## 2014-01-07 LAB — CULTURE, BLOOD (ROUTINE X 2)
CULTURE: NO GROWTH
Culture: NO GROWTH

## 2014-01-07 LAB — CYTOMEGALOVIRUS PCR, QUALITATIVE: CYTOMEGALOVIRUS DNA: NOT DETECTED

## 2014-01-07 LAB — ASPERGILLUS GALACTOMANNAN ANTIGEN
ASPERGILLUS GALACTOMANNAN AG: NOT DETECTED
Aspergillus galactomannan Index: 0.16 (ref <0.50)

## 2014-01-09 ENCOUNTER — Ambulatory Visit: Payer: BC Managed Care – PPO | Admitting: Cardiovascular Disease

## 2014-01-09 ENCOUNTER — Ambulatory Visit (INDEPENDENT_AMBULATORY_CARE_PROVIDER_SITE_OTHER): Payer: BC Managed Care – PPO | Admitting: Pharmacist

## 2014-01-09 DIAGNOSIS — I4891 Unspecified atrial fibrillation: Secondary | ICD-10-CM

## 2014-01-09 DIAGNOSIS — Z5181 Encounter for therapeutic drug level monitoring: Secondary | ICD-10-CM

## 2014-01-09 LAB — FUNGAL ANTIBODIES PANEL, ID-BLOOD
ASPERGILLUS FLAVUS ANTIBODIES: NEGATIVE
Aspergillus Niger Antibodies: NEGATIVE
Aspergillus fumigatus: NEGATIVE
Blastomyces Abs, Qn, DID: NEGATIVE
Coccidioides Antibody ID: NEGATIVE
Histoplasma Ab, Immunodiffusion: NEGATIVE

## 2014-01-09 LAB — POCT INR: INR: 3.7

## 2014-01-10 ENCOUNTER — Telehealth: Payer: Self-pay | Admitting: *Deleted

## 2014-01-10 ENCOUNTER — Encounter: Payer: Self-pay | Admitting: *Deleted

## 2014-01-10 ENCOUNTER — Other Ambulatory Visit (HOSPITAL_BASED_OUTPATIENT_CLINIC_OR_DEPARTMENT_OTHER): Payer: BC Managed Care – PPO

## 2014-01-10 ENCOUNTER — Other Ambulatory Visit: Payer: Self-pay | Admitting: *Deleted

## 2014-01-10 DIAGNOSIS — D630 Anemia in neoplastic disease: Secondary | ICD-10-CM

## 2014-01-10 DIAGNOSIS — C9000 Multiple myeloma not having achieved remission: Secondary | ICD-10-CM

## 2014-01-10 DIAGNOSIS — C9002 Multiple myeloma in relapse: Secondary | ICD-10-CM

## 2014-01-10 LAB — CBC WITH DIFFERENTIAL/PLATELET
BASO%: 0.9 % (ref 0.0–2.0)
BASOS ABS: 0 10*3/uL (ref 0.0–0.1)
EOS%: 0 % (ref 0.0–7.0)
Eosinophils Absolute: 0 10*3/uL (ref 0.0–0.5)
HEMATOCRIT: 30.6 % — AB (ref 34.8–46.6)
HEMOGLOBIN: 9.9 g/dL — AB (ref 11.6–15.9)
LYMPH%: 22.1 % (ref 14.0–49.7)
MCH: 30.3 pg (ref 25.1–34.0)
MCHC: 32.4 g/dL (ref 31.5–36.0)
MCV: 93.6 fL (ref 79.5–101.0)
MONO#: 0.7 10*3/uL (ref 0.1–0.9)
MONO%: 28.6 % — ABNORMAL HIGH (ref 0.0–14.0)
NEUT%: 48.4 % (ref 38.4–76.8)
NEUTROS ABS: 1.1 10*3/uL — AB (ref 1.5–6.5)
PLATELETS: 227 10*3/uL (ref 145–400)
RBC: 3.27 10*6/uL — ABNORMAL LOW (ref 3.70–5.45)
RDW: 19.3 % — ABNORMAL HIGH (ref 11.2–14.5)
WBC: 2.3 10*3/uL — AB (ref 3.9–10.3)
lymph#: 0.5 10*3/uL — ABNORMAL LOW (ref 0.9–3.3)

## 2014-01-10 LAB — COMPREHENSIVE METABOLIC PANEL (CC13)
ALBUMIN: 2.1 g/dL — AB (ref 3.5–5.0)
ALT: 41 U/L (ref 0–55)
AST: 22 U/L (ref 5–34)
Alkaline Phosphatase: 63 U/L (ref 40–150)
Anion Gap: 11 mEq/L (ref 3–11)
BUN: 58.9 mg/dL — AB (ref 7.0–26.0)
CO2: 29 mEq/L (ref 22–29)
Calcium: 8.1 mg/dL — ABNORMAL LOW (ref 8.4–10.4)
Chloride: 100 mEq/L (ref 98–109)
Creatinine: 4.9 mg/dL (ref 0.6–1.1)
GLUCOSE: 118 mg/dL (ref 70–140)
POTASSIUM: 5.9 meq/L — AB (ref 3.5–5.1)
SODIUM: 140 meq/L (ref 136–145)
TOTAL PROTEIN: 5.6 g/dL — AB (ref 6.4–8.3)
Total Bilirubin: 0.64 mg/dL (ref 0.20–1.20)

## 2014-01-10 LAB — EPSTEIN BARR VRS(EBV DNA BY PCR): EBV DNA QN by PCR: <200 copies/mL

## 2014-01-10 NOTE — Telephone Encounter (Signed)
Labs forwarded to Dr Joelyn Oms and Melba Coon.  Noted potassium level - called and discussed with pt who verified she is no taking any potassium orally and she will monitor diet to maintain low potassium intake.  Per discussion Jolanda states she will have labs rechecked at dialysis later this week.  Of note per phone conversation- Illa has restarted oral chemo per Dr Melba Coon on 01/09/2014

## 2014-01-12 LAB — PROTEIN ELECTROPHORESIS, SERUM
Albumin ELP: 48.2 % — ABNORMAL LOW (ref 55.8–66.1)
Alpha-1-Globulin: 12.4 % — ABNORMAL HIGH (ref 2.9–4.9)
Alpha-2-Globulin: 19.2 % — ABNORMAL HIGH (ref 7.1–11.8)
Beta 2: 8.7 % — ABNORMAL HIGH (ref 3.2–6.5)
Beta Globulin: 5.1 % (ref 4.7–7.2)
Gamma Globulin: 6.4 % — ABNORMAL LOW (ref 11.1–18.8)
M-SPIKE, %: 0.11 g/dL
Total Protein, Serum Electrophoresis: 5.2 g/dL — ABNORMAL LOW (ref 6.0–8.3)

## 2014-01-12 LAB — KAPPA/LAMBDA LIGHT CHAINS
Kappa free light chain: 3.1 mg/dL — ABNORMAL HIGH (ref 0.33–1.94)
Kappa:Lambda Ratio: 0.08 — ABNORMAL LOW (ref 0.26–1.65)
LAMBDA FREE LGHT CHN: 39.4 mg/dL — AB (ref 0.57–2.63)

## 2014-01-17 ENCOUNTER — Encounter: Payer: Self-pay | Admitting: Cardiovascular Disease

## 2014-01-17 ENCOUNTER — Ambulatory Visit (INDEPENDENT_AMBULATORY_CARE_PROVIDER_SITE_OTHER): Payer: BC Managed Care – PPO | Admitting: *Deleted

## 2014-01-17 ENCOUNTER — Ambulatory Visit (INDEPENDENT_AMBULATORY_CARE_PROVIDER_SITE_OTHER): Payer: BC Managed Care – PPO | Admitting: Cardiovascular Disease

## 2014-01-17 ENCOUNTER — Ambulatory Visit (HOSPITAL_COMMUNITY)
Admission: RE | Admit: 2014-01-17 | Discharge: 2014-01-17 | Disposition: A | Discharge status: Home / Self Care | Payer: BC Managed Care – PPO | Source: Ambulatory Visit | Attending: Oncology | Admitting: Oncology

## 2014-01-17 ENCOUNTER — Other Ambulatory Visit: Payer: Self-pay | Admitting: *Deleted

## 2014-01-17 ENCOUNTER — Ambulatory Visit (HOSPITAL_BASED_OUTPATIENT_CLINIC_OR_DEPARTMENT_OTHER): Payer: BC Managed Care – PPO

## 2014-01-17 ENCOUNTER — Other Ambulatory Visit (HOSPITAL_BASED_OUTPATIENT_CLINIC_OR_DEPARTMENT_OTHER): Payer: BC Managed Care – PPO

## 2014-01-17 ENCOUNTER — Telehealth: Payer: Self-pay | Admitting: *Deleted

## 2014-01-17 VITALS — BP 128/62 | HR 66 | Ht 62.0 in | Wt 155.0 lb

## 2014-01-17 VITALS — BP 107/50 | HR 70 | Temp 97.9°F | Resp 22

## 2014-01-17 DIAGNOSIS — J189 Pneumonia, unspecified organism: Secondary | ICD-10-CM | POA: Diagnosis present

## 2014-01-17 DIAGNOSIS — I340 Nonrheumatic mitral (valve) insufficiency: Secondary | ICD-10-CM | POA: Insufficient documentation

## 2014-01-17 DIAGNOSIS — C9002 Multiple myeloma in relapse: Secondary | ICD-10-CM

## 2014-01-17 DIAGNOSIS — Z5181 Encounter for therapeutic drug level monitoring: Secondary | ICD-10-CM

## 2014-01-17 DIAGNOSIS — C9000 Multiple myeloma not having achieved remission: Secondary | ICD-10-CM

## 2014-01-17 DIAGNOSIS — D630 Anemia in neoplastic disease: Secondary | ICD-10-CM | POA: Diagnosis not present

## 2014-01-17 DIAGNOSIS — D61818 Other pancytopenia: Secondary | ICD-10-CM

## 2014-01-17 DIAGNOSIS — N19 Unspecified kidney failure: Secondary | ICD-10-CM | POA: Diagnosis not present

## 2014-01-17 DIAGNOSIS — N186 End stage renal disease: Secondary | ICD-10-CM

## 2014-01-17 DIAGNOSIS — Z992 Dependence on renal dialysis: Secondary | ICD-10-CM

## 2014-01-17 DIAGNOSIS — Z95828 Presence of other vascular implants and grafts: Secondary | ICD-10-CM

## 2014-01-17 DIAGNOSIS — I4891 Unspecified atrial fibrillation: Secondary | ICD-10-CM

## 2014-01-17 DIAGNOSIS — Z452 Encounter for adjustment and management of vascular access device: Secondary | ICD-10-CM

## 2014-01-17 LAB — CBC WITH DIFFERENTIAL/PLATELET
BASO%: 0.1 % (ref 0.0–2.0)
Basophils Absolute: 0 10*3/uL (ref 0.0–0.1)
EOS ABS: 0 10*3/uL (ref 0.0–0.5)
EOS%: 0.1 % (ref 0.0–7.0)
HCT: 24.7 % — ABNORMAL LOW (ref 34.8–46.6)
HGB: 7.9 g/dL — ABNORMAL LOW (ref 11.6–15.9)
LYMPH%: 2 % — AB (ref 14.0–49.7)
MCH: 30.9 pg (ref 25.1–34.0)
MCHC: 32.2 g/dL (ref 31.5–36.0)
MCV: 96 fL (ref 79.5–101.0)
MONO#: 0.1 10*3/uL (ref 0.1–0.9)
MONO%: 2.5 % (ref 0.0–14.0)
NEUT#: 4.3 10*3/uL (ref 1.5–6.5)
NEUT%: 95.3 % — ABNORMAL HIGH (ref 38.4–76.8)
Platelets: 108 10*3/uL — ABNORMAL LOW (ref 145–400)
RBC: 2.57 10*6/uL — AB (ref 3.70–5.45)
RDW: 20.9 % — AB (ref 11.2–14.5)
WBC: 4.5 10*3/uL (ref 3.9–10.3)
lymph#: 0.1 10*3/uL — ABNORMAL LOW (ref 0.9–3.3)

## 2014-01-17 LAB — POCT INR: INR: 2.5

## 2014-01-17 LAB — TECHNOLOGIST REVIEW: Technologist Review: 4

## 2014-01-17 LAB — HOLD TUBE, BLOOD BANK

## 2014-01-17 MED ORDER — HEPARIN SOD (PORK) LOCK FLUSH 100 UNIT/ML IV SOLN
500.0000 [IU] | Freq: Once | INTRAVENOUS | Status: AC
  Administered 2014-01-17: 500 [IU] via INTRAVENOUS
  Filled 2014-01-17: qty 5

## 2014-01-17 MED ORDER — SODIUM CHLORIDE 0.9 % IJ SOLN
10.0000 mL | INTRAMUSCULAR | Status: DC | PRN
  Administered 2014-01-17: 10 mL via INTRAVENOUS
  Filled 2014-01-17: qty 10

## 2014-01-17 NOTE — Assessment & Plan Note (Signed)
F/U Dr Jana Hakim and [facility]  Further chemo will complicate anticoagulation, anemia and recurrent infection  Renal failure now permanent

## 2014-01-17 NOTE — Assessment & Plan Note (Addendum)
Rate control with atenolol is good.  Antiarrhythmic Rx  complicated by the inability to use amiodarone  ( chemo interaction pomalyst ) and class III drugs (WHICH WOULD LOWER DFT) secondary to  renal issues. 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  Likelihood of successful sustained NSR with acute issues and severe MR  LA moderately to severely enlarged with TTE dimension 45.

## 2014-01-17 NOTE — Assessment & Plan Note (Signed)
Continue midodrine and antibiotics during dialysis.  Would not place permanent shunt until CXR and CHF improved as may exacerbate high output CHF.  Her temporary catheter site looks great and she is on antibiotics

## 2014-01-17 NOTE — Patient Instructions (Signed)
Your physician recommends that you schedule a follow-up appointment in: [place]   Your physician recommends that you continue on your current medications as directed. Please refer to the Current Medication list given to you today. Your physician has requested that you have an echocardiogram. Echocardiography is a painless test that uses sound waves to create images of your heart. It provides your doctor with information about the size and shape of your heart and how well your heart's chambers and valves are working. This procedure takes approximately one hour. There are no restrictions for this procedure. IN  4 WEEKS

## 2014-01-17 NOTE — Progress Notes (Signed)
Patient ID: Laura Hester, female   DOB: 1955-12-19, 58 y.o.   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 Jana Hakim locally and a Dr. Melba Coon in [place]. She'd experienced progressive renal failure due to the multiple myeloma and was a recently started on hemodialysis. She also has underlying history of atrial fibrillation on Coumadin with a recent failed attempt at cardioversion on 12/29/2013.  Her chemo meds preclude use of amiodarone and flecainide also contraindicated with acute renal failure.  Seen by Dr Caryl Comes in  [facility] who felt rate control with beta blocker and no antiarrythmic best approach. She also has a diagnosis of moderate to severe mitral regurg with normal LV function which was new   In hosptial dialysis started Rx with broad spectrum antibiotics and followed by ID/Renal and Oncology  CXR today reviewed and improved but still with left upper and middle lobe infiltrates less edema.  She continues to have fairly marked exertional dyspnea no chest pain or palpitations.  She has required midodrine for BP support during dialysis  To have two units of PRBC;s this week around dialysis.  Labs from today revieweddd  Hb 7.9 INR 2.8 PLT 108  Long discussion with her and husband today.  Overall prognosis appears poor.  Dyspnea is multifactorial.  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  She is essentially anuric now.  Had trace MR on TTE 4/15 and severe MR TEE 9/15.  Suspect this had to do with sepsis, volume overload from untreated renal failure less likely ischemia  Renal failure now seems permanent.  Still with infiltrates on CXR getting ceftazadine with dialysis.  Blood counts show improved PLTls decreasing bleeding risk but still severly anemic post chemo.  Temporary Dialysis catheter has been in since July.     ROS: Denies fever,  malais, weight loss, blurry vision, decreased visual acuity, cough, sputum,  hemoptysis, pleuritic pain, palpitaitons, heartburn, abdominal pain, melena, lower extremity edema, claudication, or rash.  All other systems reviewed and negative  General: Affect appropriate Chronically ill white female  HEENT: normal Neck supple with no adenopathy JVP normal no bruits no thyromegaly Lungs clear with no wheezing and good diaphragmatic motion Heart:  S1/S2 soft MR  murmur, no rub, gallop or click Left subclavian dialysis catheter  PMI normal Abdomen: benighn, BS positve, no tenderness, no AAA no bruit.  No HSM or HJR Distal pulses intact with no bruits No edema Neuro non-focal Skin warm and dry No muscular weakness   Current Outpatient Prescriptions  Medication Sig Dispense Refill  . atenolol (TENORMIN) 25 MG tablet Take 1 tablet (25 mg total) by mouth 2 (two) times daily.  60 tablet  1  . calcium carbonate (TUMS - DOSED IN MG ELEMENTAL CALCIUM) 500 MG chewable tablet Chew 2 tablets (400 mg of elemental calcium total) by mouth 3 (three) times daily between meals.  90 tablet  1  . cefTAZidime 2 g in dextrose 5 % 50 mL Inject 2 g into the vein every Monday, Wednesday, and Friday with hemodialysis.  2 g  0  . Cyclophosphamide 50 MG CAPS Take 8 tablets by mouth once a week. 8 tablets every Mondays.      Marland Kitchen dexamethasone (DECADRON) 4 MG tablet Take 4 mg by mouth. 10 tabs every Monday.      . lidocaine-prilocaine (EMLA) cream Apply topically as needed.  30 g  3  .  midodrine (PROAMATINE) 10 MG tablet Take 1 tablet (10 mg total) by mouth every Monday, Wednesday, and Friday with hemodialysis.  30 tablet  0  . ondansetron (ZOFRAN) 4 MG tablet Take 1 tablet (4 mg total) by mouth every 6 (six) hours as needed for nausea.  65 tablet  0  . pomalidomide (POMALYST) 4 MG capsule Take 4 mg by mouth daily. Take with water on days 1-21. Repeat every 28 days.      . valACYclovir (VALTREX) 500 MG tablet Take 500 mg  by mouth every other day.      . Vancomycin (VANCOCIN) 750 MG/150ML SOLN Inject 150 mLs (750 mg total) into the vein every Monday, Wednesday, and Friday with hemodialysis.  750 mL  0  . warfarin (COUMADIN) 5 MG tablet Take 1 tablet tonight 9/25 and starting 9/26 take 2.5 mg tablet daily Saturday and Sunday), follow up in coumadin clinic on Monday 9/28  30 tablet  0  . B Complex-C-Biotin-E-Min-FA (DIALYVITE 5000 PO) Take 1 tablet by mouth daily.      . diltiazem (CARDIZEM) 30 MG tablet Take 30 mg by mouth 4 (four) times daily.      . HYDROcodone-acetaminophen (NORCO/VICODIN) 5-325 MG per tablet Take 1-2 tablets by mouth every 4 (four) hours as needed for moderate pain.  30 tablet  0  . ipratropium-albuterol (DUONEB) 0.5-2.5 (3) MG/3ML SOLN Take 3 mLs by nebulization 3 (three) times daily.  360 mL  1   No current facility-administered medications for this visit.   Facility-Administered Medications Ordered in Other Visits  Medication Dose Route Frequency Provider Last Rate Last Dose  . sodium chloride 0.9 % injection 10 mL  10 mL Intracatheter PRN Amy G Berry, PA-C   10 mL at 05/30/13 1640  . sodium chloride 0.9 % injection 10 mL  10 mL Intravenous PRN Gustav C Magrinat, MD   10 mL at 08/08/13 1518    Allergies  Wellbutrin  Electrocardiogram:  afib rate 107 nonspecific ST changes   Assessment and Plan  

## 2014-01-17 NOTE — Patient Instructions (Signed)

## 2014-01-17 NOTE — Assessment & Plan Note (Signed)
F/U echo. 4 weeks   Morphologically valve is intact.  Hopefully annulus will geometrically normalize with Rx of sepsis anemia and renal failure with volume overload. If MR gets better may consider cardioversion or antiarrhythmic RX more earnestly again She is not a surgical or mitral clip candidate at this time

## 2014-01-17 NOTE — Telephone Encounter (Signed)
[facility] RN I have scheduled appts for 10/7 and 10/8.  Desk Rn to call patient

## 2014-01-17 NOTE — Assessment & Plan Note (Signed)
She is a bit frustrated at not being able to get both units with dialysis.  Consider adding aranasp

## 2014-01-18 ENCOUNTER — Ambulatory Visit (HOSPITAL_BASED_OUTPATIENT_CLINIC_OR_DEPARTMENT_OTHER): Payer: BC Managed Care – PPO

## 2014-01-18 ENCOUNTER — Other Ambulatory Visit: Payer: Self-pay

## 2014-01-18 ENCOUNTER — Other Ambulatory Visit: Payer: Self-pay | Admitting: Emergency Medicine

## 2014-01-18 VITALS — BP 108/45 | HR 66 | Temp 98.1°F | Resp 18

## 2014-01-18 DIAGNOSIS — N186 End stage renal disease: Secondary | ICD-10-CM

## 2014-01-18 DIAGNOSIS — D61818 Other pancytopenia: Secondary | ICD-10-CM

## 2014-01-18 DIAGNOSIS — D630 Anemia in neoplastic disease: Secondary | ICD-10-CM | POA: Diagnosis not present

## 2014-01-18 DIAGNOSIS — C9000 Multiple myeloma not having achieved remission: Secondary | ICD-10-CM

## 2014-01-18 DIAGNOSIS — Z01810 Encounter for preprocedural cardiovascular examination: Secondary | ICD-10-CM

## 2014-01-18 LAB — PREPARE RBC (CROSSMATCH)

## 2014-01-18 MED ORDER — DIPHENHYDRAMINE HCL 25 MG PO CAPS
25.0000 mg | ORAL_CAPSULE | Freq: Once | ORAL | Status: AC
  Administered 2014-01-18: 25 mg via ORAL

## 2014-01-18 MED ORDER — SODIUM CHLORIDE 0.9 % IJ SOLN
10.0000 mL | INTRAMUSCULAR | Status: DC | PRN
  Administered 2014-01-18: 10 mL via INTRAVENOUS
  Filled 2014-01-18: qty 10

## 2014-01-18 MED ORDER — ACETAMINOPHEN 325 MG PO TABS
ORAL_TABLET | ORAL | Status: AC
  Filled 2014-01-18: qty 2

## 2014-01-18 MED ORDER — SODIUM CHLORIDE 0.9 % IV SOLN
INTRAVENOUS | Status: DC
  Administered 2014-01-18: 08:00:00 via INTRAVENOUS

## 2014-01-18 MED ORDER — DIPHENHYDRAMINE HCL 25 MG PO CAPS
ORAL_CAPSULE | ORAL | Status: AC
  Filled 2014-01-18: qty 2

## 2014-01-18 MED ORDER — HEPARIN SOD (PORK) LOCK FLUSH 100 UNIT/ML IV SOLN
500.0000 [IU] | Freq: Once | INTRAVENOUS | Status: AC
  Administered 2014-01-18: 500 [IU] via INTRAVENOUS
  Filled 2014-01-18: qty 5

## 2014-01-18 MED ORDER — ACETAMINOPHEN 325 MG PO TABS
650.0000 mg | ORAL_TABLET | Freq: Once | ORAL | Status: AC
  Administered 2014-01-18: 650 mg via ORAL

## 2014-01-18 NOTE — Patient Instructions (Signed)

## 2014-01-19 ENCOUNTER — Ambulatory Visit (HOSPITAL_BASED_OUTPATIENT_CLINIC_OR_DEPARTMENT_OTHER): Payer: BC Managed Care – PPO

## 2014-01-19 ENCOUNTER — Other Ambulatory Visit: Payer: Self-pay | Admitting: *Deleted

## 2014-01-19 VITALS — BP 99/50 | HR 67 | Temp 97.5°F | Resp 20

## 2014-01-19 DIAGNOSIS — C9000 Multiple myeloma not having achieved remission: Secondary | ICD-10-CM

## 2014-01-19 DIAGNOSIS — D649 Anemia, unspecified: Secondary | ICD-10-CM

## 2014-01-19 DIAGNOSIS — D630 Anemia in neoplastic disease: Secondary | ICD-10-CM | POA: Diagnosis not present

## 2014-01-19 LAB — KAPPA/LAMBDA LIGHT CHAINS
KAPPA FREE LGHT CHN: 1.85 mg/dL (ref 0.33–1.94)
Kappa:Lambda Ratio: 0.05 — ABNORMAL LOW (ref 0.26–1.65)
Lambda Free Lght Chn: 36.5 mg/dL — ABNORMAL HIGH (ref 0.57–2.63)

## 2014-01-19 MED ORDER — SODIUM CHLORIDE 0.9 % IJ SOLN
10.0000 mL | INTRAMUSCULAR | Status: AC | PRN
  Administered 2014-01-19: 10 mL
  Filled 2014-01-19: qty 10

## 2014-01-19 MED ORDER — HEPARIN SOD (PORK) LOCK FLUSH 100 UNIT/ML IV SOLN
500.0000 [IU] | Freq: Every day | INTRAVENOUS | Status: AC | PRN
  Administered 2014-01-19: 500 [IU]
  Filled 2014-01-19: qty 5

## 2014-01-19 MED ORDER — ACETAMINOPHEN 325 MG PO TABS
ORAL_TABLET | ORAL | Status: AC
  Filled 2014-01-19: qty 2

## 2014-01-19 MED ORDER — DIPHENHYDRAMINE HCL 25 MG PO CAPS
ORAL_CAPSULE | ORAL | Status: AC
  Filled 2014-01-19: qty 1

## 2014-01-19 MED ORDER — DIPHENHYDRAMINE HCL 25 MG PO CAPS
25.0000 mg | ORAL_CAPSULE | Freq: Once | ORAL | Status: AC
  Administered 2014-01-19: 25 mg via ORAL

## 2014-01-19 MED ORDER — SODIUM CHLORIDE 0.9 % IV SOLN
250.0000 mL | Freq: Once | INTRAVENOUS | Status: AC
  Administered 2014-01-19: 250 mL via INTRAVENOUS

## 2014-01-19 MED ORDER — ACETAMINOPHEN 325 MG PO TABS
650.0000 mg | ORAL_TABLET | Freq: Once | ORAL | Status: AC
  Administered 2014-01-19: 650 mg via ORAL

## 2014-01-19 NOTE — Patient Instructions (Signed)

## 2014-01-20 LAB — TYPE AND SCREEN
ABO/RH(D): A POS
ANTIBODY SCREEN: NEGATIVE
UNIT DIVISION: 0
Unit division: 0

## 2014-01-24 ENCOUNTER — Ambulatory Visit (HOSPITAL_BASED_OUTPATIENT_CLINIC_OR_DEPARTMENT_OTHER): Payer: BC Managed Care – PPO

## 2014-01-24 ENCOUNTER — Other Ambulatory Visit (HOSPITAL_BASED_OUTPATIENT_CLINIC_OR_DEPARTMENT_OTHER): Payer: BC Managed Care – PPO

## 2014-01-24 DIAGNOSIS — C9002 Multiple myeloma in relapse: Secondary | ICD-10-CM

## 2014-01-24 DIAGNOSIS — D630 Anemia in neoplastic disease: Secondary | ICD-10-CM

## 2014-01-24 DIAGNOSIS — Z95828 Presence of other vascular implants and grafts: Secondary | ICD-10-CM

## 2014-01-24 DIAGNOSIS — Z452 Encounter for adjustment and management of vascular access device: Secondary | ICD-10-CM

## 2014-01-24 DIAGNOSIS — C9000 Multiple myeloma not having achieved remission: Secondary | ICD-10-CM

## 2014-01-24 LAB — COMPREHENSIVE METABOLIC PANEL (CC13)
ALT: 49 U/L (ref 0–55)
ANION GAP: 15 meq/L — AB (ref 3–11)
AST: 17 U/L (ref 5–34)
Albumin: 2.2 g/dL — ABNORMAL LOW (ref 3.5–5.0)
Alkaline Phosphatase: 50 U/L (ref 40–150)
BUN: 88.4 mg/dL — AB (ref 7.0–26.0)
CALCIUM: 8.1 mg/dL — AB (ref 8.4–10.4)
CHLORIDE: 99 meq/L (ref 98–109)
CO2: 25 meq/L (ref 22–29)
CREATININE: 4.8 mg/dL — AB (ref 0.6–1.1)
Glucose: 137 mg/dl (ref 70–140)
Potassium: 5.9 mEq/L — ABNORMAL HIGH (ref 3.5–5.1)
Sodium: 139 mEq/L (ref 136–145)
Total Bilirubin: 0.94 mg/dL (ref 0.20–1.20)
Total Protein: 5 g/dL — ABNORMAL LOW (ref 6.4–8.3)

## 2014-01-24 LAB — CBC WITH DIFFERENTIAL/PLATELET
BASO%: 0 % (ref 0.0–2.0)
Basophils Absolute: 0 10*3/uL (ref 0.0–0.1)
EOS%: 2.9 % (ref 0.0–7.0)
Eosinophils Absolute: 0 10*3/uL (ref 0.0–0.5)
HCT: 28.4 % — ABNORMAL LOW (ref 34.8–46.6)
HGB: 9.6 g/dL — ABNORMAL LOW (ref 11.6–15.9)
LYMPH#: 0.1 10*3/uL — AB (ref 0.9–3.3)
LYMPH%: 8.6 % — ABNORMAL LOW (ref 14.0–49.7)
MCH: 31.2 pg (ref 25.1–34.0)
MCHC: 33.8 g/dL (ref 31.5–36.0)
MCV: 92.2 fL (ref 79.5–101.0)
MONO#: 0.1 10*3/uL (ref 0.1–0.9)
MONO%: 15.7 % — AB (ref 0.0–14.0)
NEUT#: 0.5 10*3/uL — CL (ref 1.5–6.5)
NEUT%: 72.8 % (ref 38.4–76.8)
Platelets: 70 10*3/uL — ABNORMAL LOW (ref 145–400)
RBC: 3.08 10*6/uL — ABNORMAL LOW (ref 3.70–5.45)
RDW: 18.1 % — AB (ref 11.2–14.5)
WBC: 0.7 10*3/uL — CL (ref 3.9–10.3)
nRBC: 0 % (ref 0–0)

## 2014-01-24 LAB — TECHNOLOGIST REVIEW

## 2014-01-24 MED ORDER — HEPARIN SOD (PORK) LOCK FLUSH 100 UNIT/ML IV SOLN
500.0000 [IU] | Freq: Once | INTRAVENOUS | Status: AC
  Administered 2014-01-24: 500 [IU] via INTRAVENOUS
  Filled 2014-01-24: qty 5

## 2014-01-24 MED ORDER — SODIUM CHLORIDE 0.9 % IJ SOLN
10.0000 mL | INTRAMUSCULAR | Status: DC | PRN
  Administered 2014-01-24: 10 mL via INTRAVENOUS
  Filled 2014-01-24: qty 10

## 2014-01-24 NOTE — Patient Instructions (Signed)

## 2014-01-25 ENCOUNTER — Other Ambulatory Visit: Payer: Self-pay | Admitting: Oncology

## 2014-01-25 ENCOUNTER — Telehealth: Payer: Self-pay | Admitting: Oncology

## 2014-01-25 NOTE — Telephone Encounter (Signed)
per pof to sch appt-cld & left message for pt time & date for appt

## 2014-01-25 NOTE — Progress Notes (Unsigned)
Laura Hester came for lab work in the lab tests had "Dr. unknown clothes. She thought that event that she had no further foot hold here. I called to reassure her that we are going to continue to be her doctors no matter many other specialists she gatherrs. I made her an appointment to see me on October 27. She has an appointment at [facility] with Dr. Fabian Sharp the week before.

## 2014-01-26 LAB — PROTEIN ELECTROPHORESIS, SERUM
Albumin ELP: 55.5 % — ABNORMAL LOW (ref 55.8–66.1)
Alpha-1-Globulin: 11.7 % — ABNORMAL HIGH (ref 2.9–4.9)
Alpha-2-Globulin: 14.3 % — ABNORMAL HIGH (ref 7.1–11.8)
BETA 2: 11.6 % — AB (ref 3.2–6.5)
Beta Globulin: 3.3 % — ABNORMAL LOW (ref 4.7–7.2)
Gamma Globulin: 3.6 % — ABNORMAL LOW (ref 11.1–18.8)
M-SPIKE, %: 0.38 g/dL
TOTAL PROTEIN, SERUM ELECTROPHOR: 5.1 g/dL — AB (ref 6.0–8.3)

## 2014-01-26 LAB — KAPPA/LAMBDA LIGHT CHAINS
Kappa free light chain: 0.71 mg/dL (ref 0.33–1.94)
Kappa:Lambda Ratio: 0.04 — ABNORMAL LOW (ref 0.26–1.65)
Lambda Free Lght Chn: 18.6 mg/dL — ABNORMAL HIGH (ref 0.57–2.63)

## 2014-01-27 ENCOUNTER — Ambulatory Visit (INDEPENDENT_AMBULATORY_CARE_PROVIDER_SITE_OTHER): Payer: BC Managed Care – PPO

## 2014-01-27 ENCOUNTER — Other Ambulatory Visit: Payer: Self-pay | Admitting: *Deleted

## 2014-01-27 DIAGNOSIS — I4891 Unspecified atrial fibrillation: Secondary | ICD-10-CM

## 2014-01-27 DIAGNOSIS — Z5181 Encounter for therapeutic drug level monitoring: Secondary | ICD-10-CM

## 2014-01-27 LAB — POCT INR: INR: 4.1

## 2014-01-30 ENCOUNTER — Other Ambulatory Visit: Payer: Self-pay | Admitting: Oncology

## 2014-01-31 ENCOUNTER — Telehealth: Payer: Self-pay | Admitting: Vascular Surgery

## 2014-01-31 ENCOUNTER — Encounter: Payer: Self-pay | Admitting: *Deleted

## 2014-01-31 ENCOUNTER — Other Ambulatory Visit: Payer: Self-pay | Admitting: Oncology

## 2014-01-31 NOTE — Telephone Encounter (Signed)
Per message left by patient's husband [name], patient passed away 2014/02/08 at her parent's home. Appointments cancelled. Called spouse.

## 2014-01-31 NOTE — Telephone Encounter (Signed)
Patient's son [name] called to cancel appointments. Patient passed away on 02/23/2014/

## 2014-01-31 NOTE — Progress Notes (Unsigned)
02-02-14 @ 9:00 am, SWOG 1304, Follow Up, Report of Death:  [name]'s husband contacted [facility], PharmD to report that Laura Hester died 02-02-2014 at 6:30 am at her parent's home.  At his request, contacted Dr. Collier Salina Voorhee's office at the [facility] to cancel her appointment of 01/31/14.

## 2014-02-01 ENCOUNTER — Other Ambulatory Visit (HOSPITAL_COMMUNITY): Payer: BC Managed Care – PPO

## 2014-02-01 ENCOUNTER — Encounter (HOSPITAL_COMMUNITY): Payer: BC Managed Care – PPO

## 2014-02-01 ENCOUNTER — Ambulatory Visit: Payer: BC Managed Care – PPO | Admitting: Vascular Surgery

## 2014-02-07 ENCOUNTER — Other Ambulatory Visit: Payer: BC Managed Care – PPO

## 2014-02-07 ENCOUNTER — Ambulatory Visit: Payer: BC Managed Care – PPO | Admitting: Oncology

## 2014-02-12 DEATH — deceased

## 2014-02-16 ENCOUNTER — Other Ambulatory Visit (HOSPITAL_COMMUNITY): Payer: BC Managed Care – PPO

## 2014-02-28 ENCOUNTER — Other Ambulatory Visit (HOSPITAL_COMMUNITY): Payer: BC Managed Care – PPO

## 2014-02-28 ENCOUNTER — Ambulatory Visit: Payer: BC Managed Care – PPO | Admitting: Cardiovascular Disease

## 2014-02-28 ENCOUNTER — Ambulatory Visit: Payer: BC Managed Care – PPO | Admitting: Vascular Surgery

## 2014-02-28 ENCOUNTER — Encounter (HOSPITAL_COMMUNITY): Payer: BC Managed Care – PPO

## 2015-07-13 IMAGING — CR DG BONE SURVEY MET
10 series · 10 of 10 positions shown · non-contrast
Comparison: 08/26/2011

CLINICAL DATA: History of multiple myeloma.

EXAM:
METASTATIC BONE SURVEY

[w chest pa]
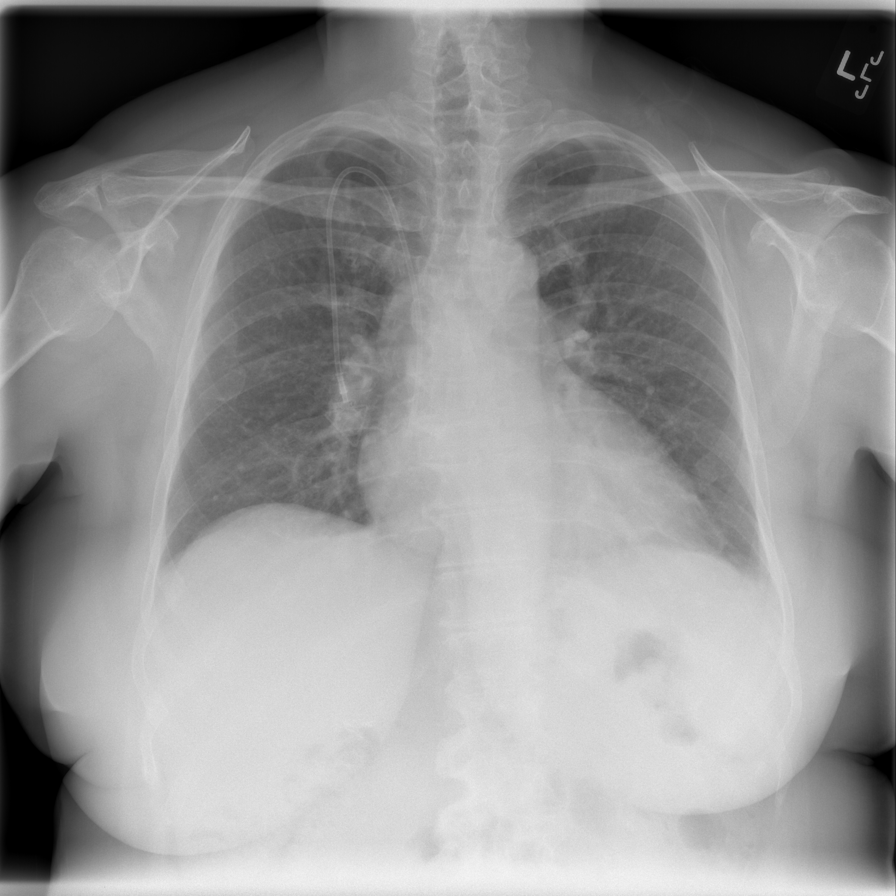

[w c-spine lat]
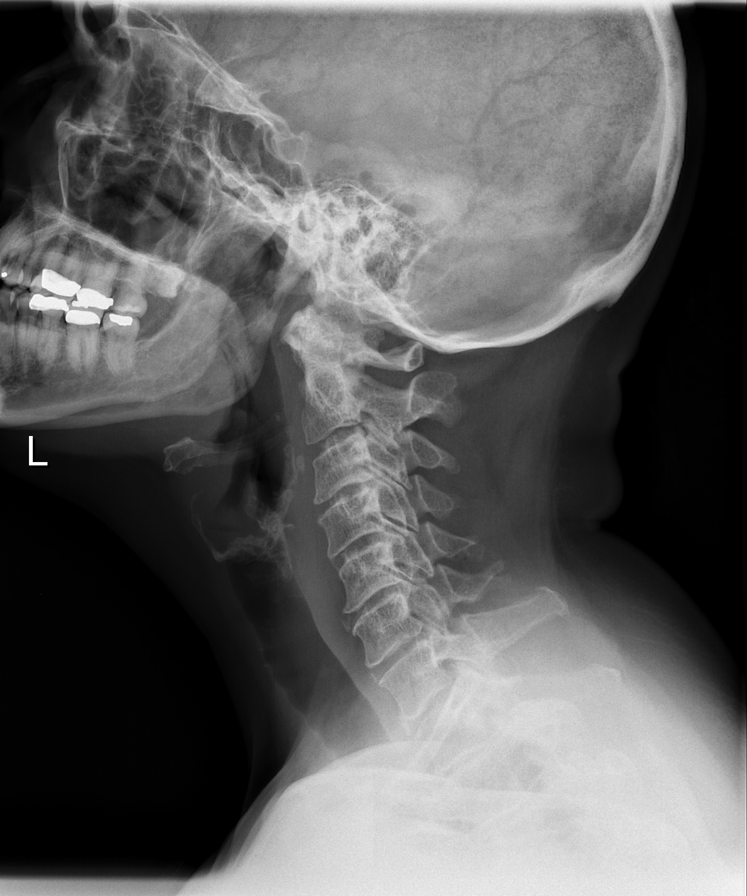

[w skull lat *]
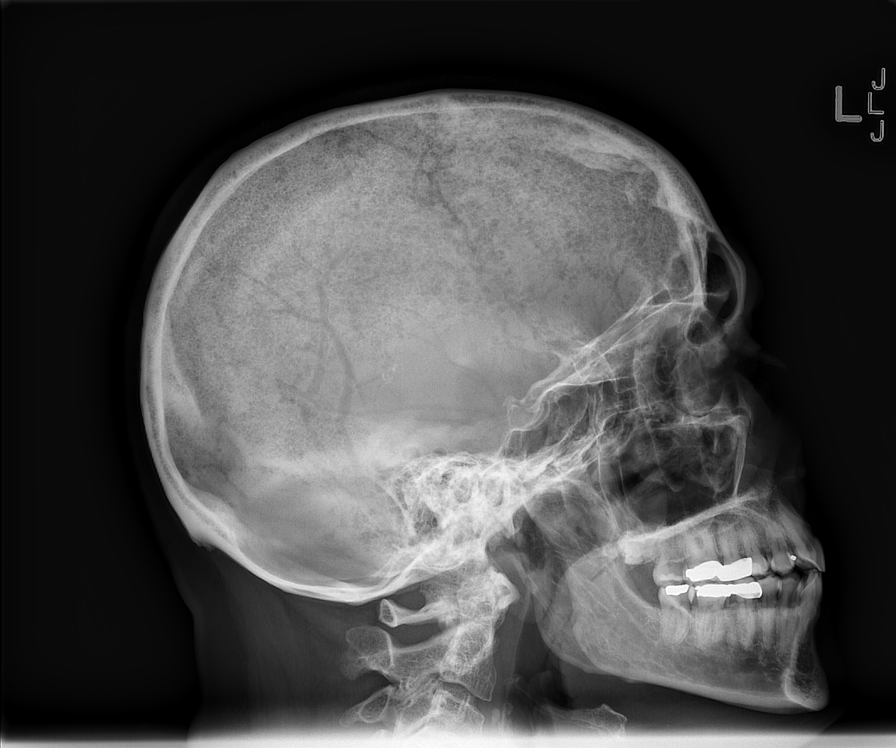

[t c-spine a.p.]
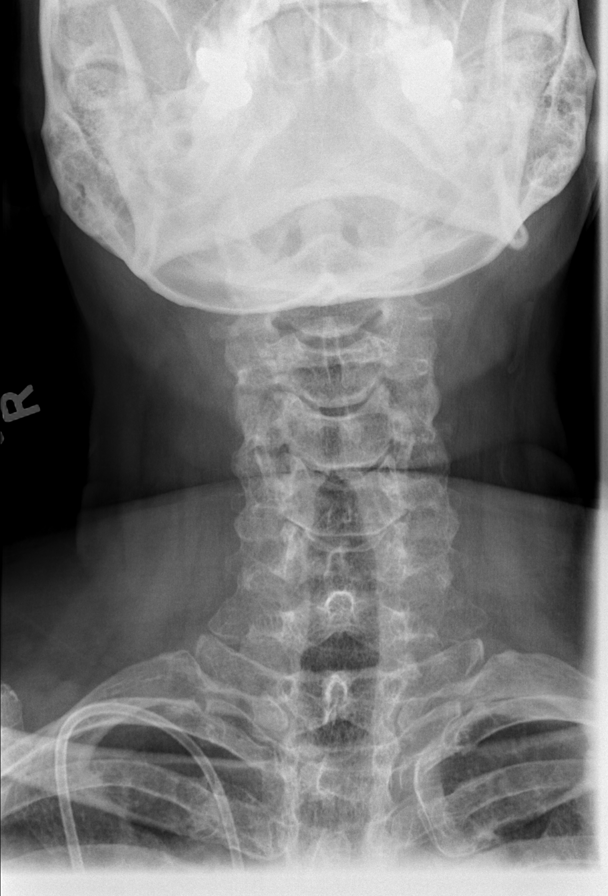

[t t-spine a.p.]
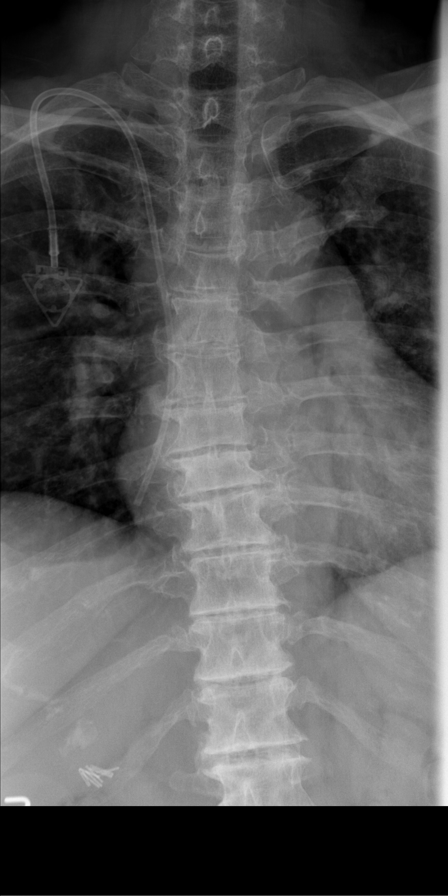

[t l-spine a.p.]
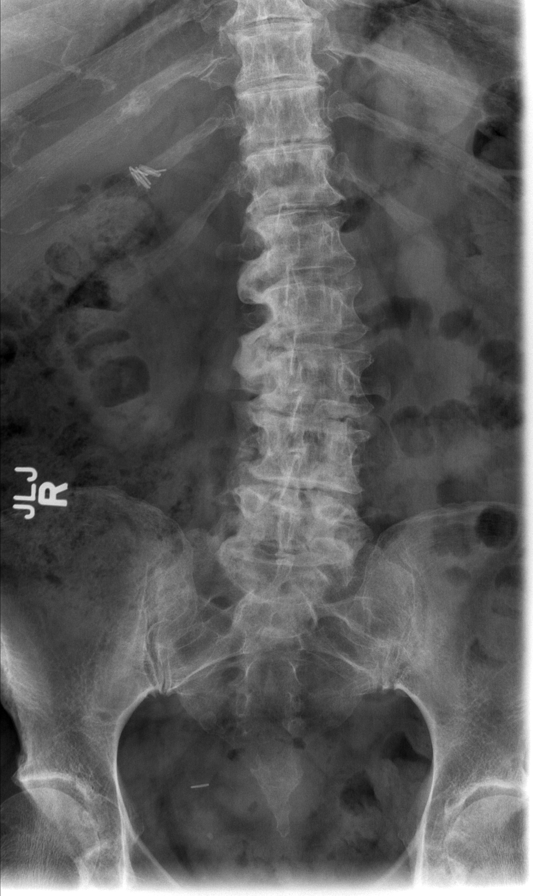

[t pelvis a.p.]
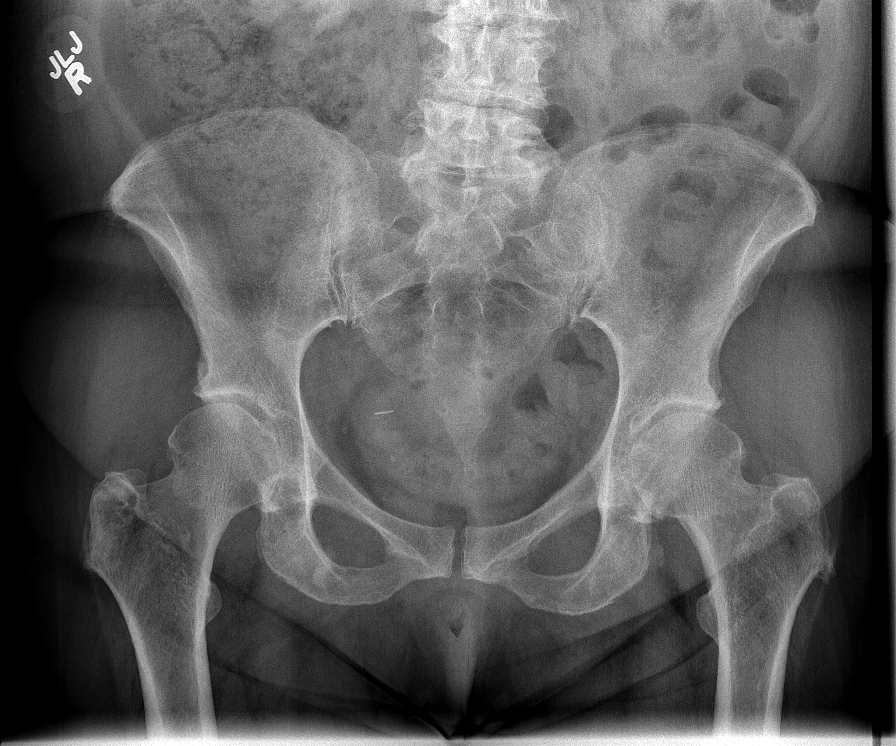

[t femur with hip  ap left]
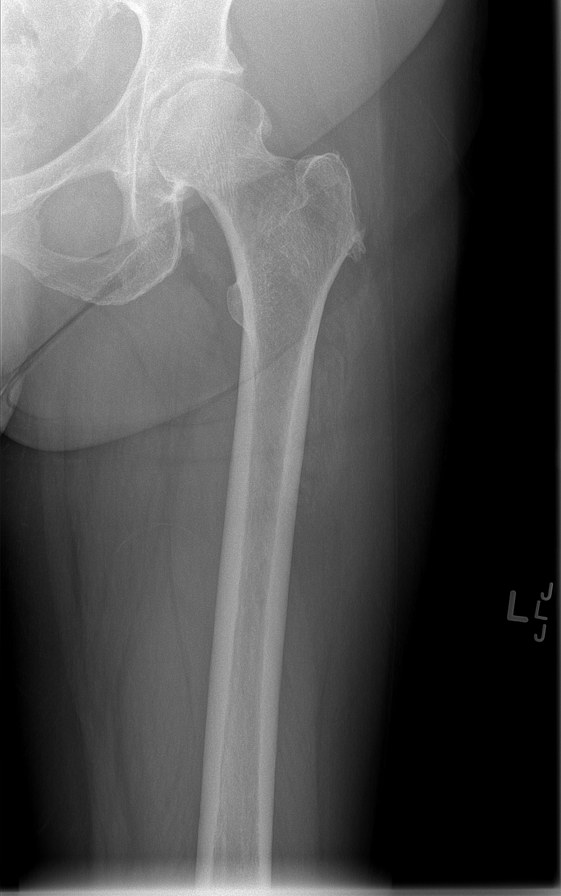

[t femur with knee ap left]
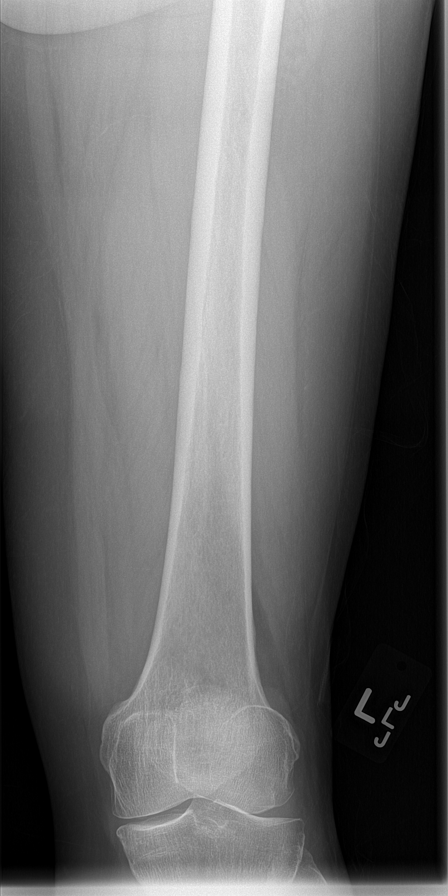

[t femur with hip  ap right]
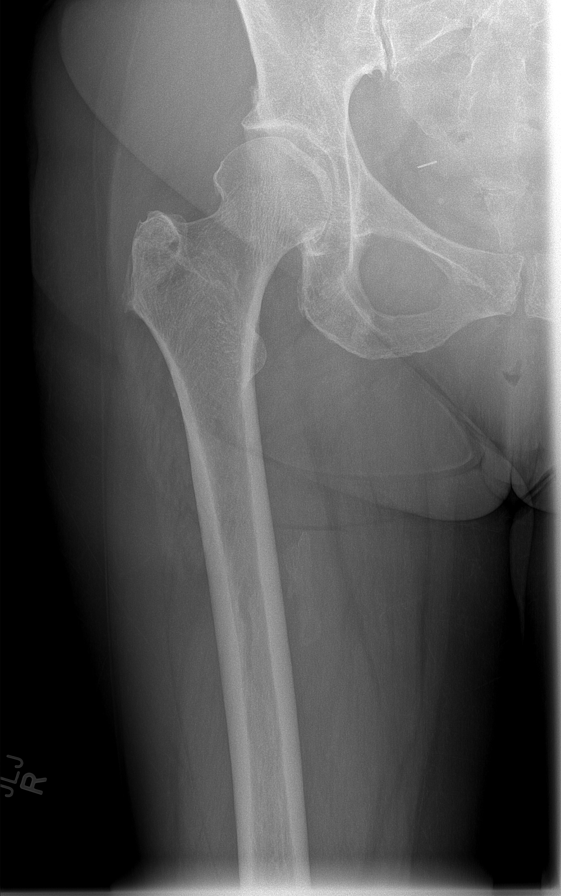

[10 of 10 positions shown; findings below may reference images not displayed]

FINDINGS: There are multiple small lucent bone lesions scattered throughout
the axial and appendicular skeleton, most prominent in the skull,
consistent with the given diagnosis of multiple myeloma. There is no
radiographic evidence of progression since the prior bone survey.
IMPRESSION: Multiple lytic lesions are seen consistent with the given diagnosis
of multiple myeloma. There has been no progression since the prior
bone survey.

## 2015-09-22 ENCOUNTER — Other Ambulatory Visit: Payer: Self-pay | Admitting: Nurse Practitioner
